# Patient Record
Sex: Female | Born: 1935
Health system: Southern US, Community
[De-identification: ages and names within clinical notes are randomized; demographics above are authoritative.]

## PROBLEM LIST (undated history)

## (undated) DIAGNOSIS — F419 Anxiety disorder, unspecified: Secondary | ICD-10-CM

## (undated) DIAGNOSIS — E785 Hyperlipidemia, unspecified: Secondary | ICD-10-CM

## (undated) DIAGNOSIS — I10 Essential (primary) hypertension: Secondary | ICD-10-CM

## (undated) DIAGNOSIS — K219 Gastro-esophageal reflux disease without esophagitis: Secondary | ICD-10-CM

## (undated) DIAGNOSIS — Z9889 Other specified postprocedural states: Secondary | ICD-10-CM

## (undated) HISTORY — DX: Gastro-esophageal reflux disease without esophagitis: K21.9

## (undated) HISTORY — PX: OTHER SURGICAL HISTORY: SHX169

## (undated) HISTORY — PX: CERVICAL DISC SURGERY: SHX588

## (undated) HISTORY — DX: Essential (primary) hypertension: I10

## (undated) HISTORY — DX: Anxiety disorder, unspecified: F41.9

## (undated) HISTORY — DX: Other specified postprocedural states: Z98.890

## (undated) HISTORY — DX: Hyperlipidemia, unspecified: E78.5

---

## 2007-10-15 ENCOUNTER — Ambulatory Visit: Payer: Self-pay | Admitting: Cardiology

## 2009-08-21 ENCOUNTER — Ambulatory Visit: Payer: Self-pay | Admitting: Cardiology

## 2011-08-07 DIAGNOSIS — R079 Chest pain, unspecified: Secondary | ICD-10-CM

## 2011-08-08 DIAGNOSIS — R079 Chest pain, unspecified: Secondary | ICD-10-CM

## 2011-08-09 ENCOUNTER — Inpatient Hospital Stay (HOSPITAL_COMMUNITY)
Admission: AD | Admit: 2011-08-09 | Discharge: 2011-08-10 | DRG: 287 | Disposition: A | Discharge status: Home / Self Care | Payer: Medicare HMO | Source: Other Acute Inpatient Hospital | Attending: Internal Medicine | Admitting: Internal Medicine

## 2011-08-09 DIAGNOSIS — R0789 Other chest pain: Secondary | ICD-10-CM | POA: Diagnosis present

## 2011-08-09 DIAGNOSIS — I2510 Atherosclerotic heart disease of native coronary artery without angina pectoris: Secondary | ICD-10-CM

## 2011-08-09 DIAGNOSIS — I10 Essential (primary) hypertension: Secondary | ICD-10-CM | POA: Diagnosis present

## 2011-08-09 DIAGNOSIS — F411 Generalized anxiety disorder: Secondary | ICD-10-CM | POA: Diagnosis present

## 2011-08-09 DIAGNOSIS — R079 Chest pain, unspecified: Secondary | ICD-10-CM

## 2011-08-09 DIAGNOSIS — E785 Hyperlipidemia, unspecified: Secondary | ICD-10-CM | POA: Diagnosis present

## 2011-08-09 DIAGNOSIS — K219 Gastro-esophageal reflux disease without esophagitis: Secondary | ICD-10-CM | POA: Diagnosis present

## 2011-08-10 LAB — CBC
HCT: 34.2 % — ABNORMAL LOW (ref 36.0–46.0)
MCH: 31.4 pg (ref 26.0–34.0)
MCV: 88.1 fL (ref 78.0–100.0)
Platelets: 173 10*3/uL (ref 150–400)
RBC: 3.88 MIL/uL (ref 3.87–5.11)
RDW: 13.1 % (ref 11.5–15.5)
WBC: 7 10*3/uL (ref 4.0–10.5)

## 2011-08-10 LAB — BASIC METABOLIC PANEL
BUN: 13 mg/dL (ref 6–23)
CO2: 26 mEq/L (ref 19–32)
Calcium: 9.1 mg/dL (ref 8.4–10.5)
Chloride: 107 mEq/L (ref 96–112)
Creatinine, Ser: 0.6 mg/dL (ref 0.50–1.10)

## 2011-08-23 NOTE — Discharge Summary (Signed)
NAME:  Heather, [name] NO.:  [id number]  MEDICAL RECORD NO.:  [id number]  LOCATION:  4704                         FACILITY:  [facility]  PHYSICIAN:  Lorine Bears, MD     DATE OF BIRTH:  May 29, 1931  DATE OF ADMISSION:  08/09/2011 DATE OF DISCHARGE:  08/10/2011                              DISCHARGE SUMMARY   PRIMARY CARDIOLOGIST:  Lorine Bears, MD  PRIMARY CARE PROVIDER:  Dr. Clelia Croft.  DISCHARGE DIAGNOSES: 1. Nonobstructive coronary artery disease. 2. Chest pain.  SECONDARY DIAGNOSES: 1. Hypertension. 2. Hyperlipidemia. 3. Gastroesophageal reflux disease. 4. Anxiety.  ALLERGIES:  No known drug allergies.  PROCEDURES/DIAGNOSTICS PERFORMED DURING HOSPITALIZATION:  Left heart catheterization with coronary arteriography. A.  Left main free of critical disease.  Left anterior descending artery is heavily calcified with multiple 30% areas of narrowing throughout the mid and distal vessel and diffuse luminal irregularities throughout with about 50% in the distal region, two diagonal branches with mild luminal irregularities, but no critical disease.  Circumflex by three posterior lateral branches with mild irregularities with no critical stenosis. Right coronary artery with 30-40% narrowing proximally and about 30-40% stenoses distally.  PDA and PLA are widely patent.  REASON FOR HOSPITALIZATION:  This is a 77 year old female who presented to the [facility] on August 06, 2011, with complaints of chest pain that started after moving boxes at home.  The pain was felt to be somewhat atypical, it was felt more musculoskeletal in nature; therefore, she underwent a nuclear stress test demonstrating inferolateral ischemia with an ejection fraction of 73%.  With the abnormal stress test, it was recommended that the patient proceed with cardiac catheterization.  She was agreeable to this and was transferred to [facility] for cardiac  catheterization.  HOSPITAL COURSE:  On August 09, 2011, the patient was brought to the Cardiac Cath Lab at [facility], informed consent was obtained by Dr. Riley Kill. As above, cardiac catheterization did demonstrate nonobstructive coronary artery disease. Please see above and Dr. Rosalyn Charters report for full details.  The patient did tolerate the procedure well and was brought for overnight observation.  The patient had no further complaints of chest pain.  She was ambulating without difficulty.  Her right groin was without evidence of hematoma. The patient will be discharged to home in good condition.  Statin has been added to her medical regimen.  DISCHARGE LABORATORY DATA:  Sodium 140, potassium 3.9, BUN 13, creatinine 0.6.  WBC is 7, hemoglobin 12.2, hematocrit 34.2, platelet 173.  DISCHARGE MEDICATIONS: 1. Crestor 40 mg one tablet daily. 2. Aspirin 81 mg one tablet daily. 3. Metoprolol tartrate 50 mg one tablet in the morning and half tablet     at night. 4. Omeprazole 20 mg daily. 5. Tramadol/acetaminophen 37.5/325 mg one tablet twice daily. 6. Clonazepam 1 mg one tablet nightly. 7. Gabapentin 100 mg one capsule twice daily. 8. MiraLax 17 g daily. 9. Multivitamin daily. 10.Vitamin D OTC one tablet daily. 11.Aleve 220 mg two tablets twice daily as needed. 12.Nitroglycerin sublingual 0.4 mg one tablet under the tongue at the     onset of chest pain, and may repeat every 5 minutes up to 3 doses.  FOLLOWUP PLANS AND INSTRUCTIONS: 1. The  patient will follow up with Dr. Kirke Corin in the [facility] in 3 weeks.  The office will call to schedule this     appointment. 2. The patient should follow up with her primary care provider as     previously scheduled. 3. The patient should her increase activity as tolerated.  She may     shower, but no bathing.  No lifting for 1 week greater than 5     pounds.  No driving for 2 days.  No sexual activity for 1 week. 4. She is to return  to work on August 20, 2011. 5. She is to continue low-sodium, heart-healthy diet. 6. She is to keep her cath site clean and dry, and call the office for     any problems. 7. She is to stop any activity that causes chest pain or shortness of     breath.  She may call the office for any problems or concerns.  DURATION OF DISCHARGE:  Thirty five minutes with physician and physician assistant time.     Leonette Monarch, PA-C   ______________________________ Lorine Bears, MD   NB/MEDQ  D:  08/10/2011  T:  08/11/2011  Job:  [id number]  cc:   Doylene Canning. Ladona Ridgel, MD Dr. Clelia Croft  Electronically Signed by Alen Blew P.A. on 08/11/2011 12:11:34 PM Electronically Signed by Lorine Bears MD on 08/23/2011 03:54:41 PM

## 2011-08-27 NOTE — Cardiovascular Report (Signed)
NAME:  Heather [name], Heather [name] NO.:  [id number]  MEDICAL RECORD NO.:  [id number]  LOCATION:  4704                         FACILITY:  [facility]  PHYSICIAN:  Arturo Morton. Riley Kill, MD, FACCDATE OF BIRTH:  06-Jul-1931  DATE OF PROCEDURE:  08/09/2011 DATE OF DISCHARGE:                           CARDIAC CATHETERIZATION   INDICATIONS:  This very nice lady who is 77 year old, she presented with chest pain.  The chest pain is somewhat atypical and that it sounds musculoskeletal in character.  However, she underwent radionuclide imaging studies.  The radionuclide imaging studies were suggestive of inferior ischemia.  She was seen by with [facility], and with positive inferior ischemia and ejection fraction 73%, it was felt that diagnostic cardiac catheterization was indicated.  She was transferred to [facility] for further evaluation.  PROCEDURES: 1. Left heart catheterization. 2. Coronary arteriography.  DESCRIPTION OF PROCEDURE:  The procedure was performed from the right femoral artery using 4-French catheters.  She tolerated this well. There were no complications.  She was taken to the holding area in satisfactory clinical condition.  HEMODYNAMIC DATA: 1. Central aortic pressure 154/67, mean 103. 2. LV pressure 166/19. 3. There was no gradient or pullback across aortic valve.  ANGIOGRAPHIC DATA: 1. Ventriculography was not performed.  We have multiple sources of     ventricular function data. 2. The left main is large in caliber and free of critical disease. 3. The left anterior descending artery is fairly heavily calcified.     There are multiple 30% areas of narrowing throughout the mid and     distal vessel and has diffuse luminal irregularities all throughout     the mid vessel up to about 50% in the distal LAD, however, it does     appear to be smooth and somewhat diffusely plaqued.  The apical LAD     goes to the apex.  Prior to this, there are 2 diagonal  branches.     The diagonal branches have some mild luminal irregularity, but did     not demonstrate critical disease. 4. The circumflex is a large-caliber vessel.  There is an AV     circumflex that supplies 3 posterolateral branches with mild     luminal irregularity, but no critical stenoses.  There is a very     large bifurcating marginal.  The marginal distally goes out over     the apical tip.  It is large in caliber and without critical     narrowing. 5. The right coronary artery is also somewhat calcified.  The right     coronary artery has about 30-40% narrowing proximally and about 30-     40% narrowing distally prior to the PDA and PLA.  The PDA and PLA     are widely patent, however.  CONCLUSIONS: 1. Nonobstructive coronary artery disease. 2. Chest pain.  DISPOSITION:  The patient will be discharged in the morning with followup with her primary care physician and Cardiology.     Arturo Morton. Riley Kill, MD, [facility]     TDS/MEDQ  D:  08/09/2011  T:  08/10/2011  Job:  [id number]  cc:   Lorine Bears, MD Doreen Beam, MD Kirstie Peri, MD  Electronically Signed by Shawnie Pons MD [facility] on 08/27/2011 09:13:43 AM

## 2012-04-04 ENCOUNTER — Ambulatory Visit: Payer: Medicare PPO | Admitting: Cardiology

## 2012-04-06 ENCOUNTER — Encounter: Payer: Self-pay | Admitting: *Deleted

## 2012-04-11 ENCOUNTER — Encounter: Payer: Self-pay | Admitting: Cardiology

## 2012-04-11 ENCOUNTER — Ambulatory Visit (INDEPENDENT_AMBULATORY_CARE_PROVIDER_SITE_OTHER): Payer: Medicare Other | Admitting: Cardiology

## 2012-04-11 VITALS — BP 147/70 | HR 61 | Ht 67.0 in | Wt 181.4 lb

## 2012-04-11 DIAGNOSIS — Z0389 Encounter for observation for other suspected diseases and conditions ruled out: Secondary | ICD-10-CM

## 2012-04-11 DIAGNOSIS — R002 Palpitations: Secondary | ICD-10-CM

## 2012-04-11 DIAGNOSIS — G4700 Insomnia, unspecified: Secondary | ICD-10-CM

## 2012-04-11 DIAGNOSIS — R079 Chest pain, unspecified: Secondary | ICD-10-CM

## 2012-04-11 MED ORDER — TRAZODONE HCL 100 MG PO TABS: ORAL_TABLET | ORAL | Status: DC

## 2012-04-11 NOTE — Patient Instructions (Addendum)
Continue all current medications. Trazodone 100mg  - may take 1/2 tab nightly for sleep.  May increase to 1 tab if necessary.    If medication above is helpful, follow up with primary MD regarding long term use and refills Your physician wants you to follow up in: 6 months.  You will receive a reminder letter in the mail one-two months in advance.  If you don't receive a letter, please call our office to schedule the follow up appointment

## 2012-04-22 DIAGNOSIS — R002 Palpitations: Secondary | ICD-10-CM | POA: Insufficient documentation

## 2012-04-22 DIAGNOSIS — R079 Chest pain, unspecified: Secondary | ICD-10-CM | POA: Insufficient documentation

## 2012-04-22 DIAGNOSIS — Z0389 Encounter for observation for other suspected diseases and conditions ruled out: Secondary | ICD-10-CM | POA: Insufficient documentation

## 2012-04-22 DIAGNOSIS — G4700 Insomnia, unspecified: Secondary | ICD-10-CM | POA: Insufficient documentation

## 2012-04-22 NOTE — Progress Notes (Signed)
Peyton Bottoms, MD, [facility] ABIM Board Certified in Adult Cardiovascular Medicine,Internal Medicine and Critical Care Medicine    CC:      followup patient with a history of substernal chest pain.                                                                            HPI:     Patient had a prior cardiac catheterization in 2012.  It showed no coronary artery disease.  The patient reports no anginal chest pain.  Sometimes it feels her chest distended.  She still is occasional palpitations particularly when she works at [employer] and is under stress.  At times also she feels very depressed.  She has difficulty sleeping.  Cardiac standpoint however she denies any orthopnea PND presyncope or syncope.    PMH: reviewed and listed in Problem List in Electronic Records (and see below) Past Medical History  Diagnosis Date  . Unspecified essential hypertension   . Other and unspecified hyperlipidemia   . GERD (gastroesophageal reflux disease)   . Anxiety    Past Surgical History  Procedure Date  . Cesarean section 1970    Allergies/SH/FHX : available in Electronic Records for review  No Known Allergies History   Social History  . Marital Status: Married    Spouse Name: N/A    Number of Children: N/A  . Years of Education: N/A   Occupational History  . Not on file.   Social History Main Topics  . Smoking status: Never Smoker   . Smokeless tobacco: Not on file  . Alcohol Use: Not on file  . Drug Use: Not on file  . Sexually Active: Not on file   Other Topics Concern  . Not on file   Social History Narrative  . No narrative on file   Family History  Problem Relation Age of Onset  . Hypertension    . Heart attack Sister     Medications: Current Outpatient Prescriptions  Medication Sig Dispense Refill  . ALPRAZolam (XANAX) 1 MG tablet Take 1 mg by mouth at bedtime.       Marland Kitchen aspirin 81 MG tablet Take 81 mg by mouth daily.      . chlorthalidone (HYGROTON) 25 MG tablet Take 25  mg by mouth daily.      . clonazePAM (KLONOPIN) 1 MG tablet Take 1 mg by mouth at bedtime as needed.      Marland Kitchen esomeprazole (NEXIUM) 40 MG capsule Take 40 mg by mouth daily before breakfast.      . fish oil-omega-3 fatty acids 1000 MG capsule Take 1 g by mouth daily.      Marland Kitchen gabapentin (NEURONTIN) 300 MG capsule Take 300 mg by mouth daily.      . metoprolol (LOPRESSOR) 50 MG tablet Take 50 mg by mouth 2 (two) times daily.      . Multiple Vitamin (MULTIVITAMIN) tablet Take 1 tablet by mouth daily.      . naproxen sodium (ANAPROX) 220 MG tablet Take 220 mg by mouth 2 (two) times daily with a meal.      . omeprazole (PRILOSEC) 20 MG capsule Take 20 mg by mouth daily.      Marland Kitchen  polyethylene glycol (MIRALAX / GLYCOLAX) packet Take 17 g by mouth daily.      . simvastatin (ZOCOR) 40 MG tablet Take 40 mg by mouth at bedtime.       . thiamine (VITAMIN B-1) 100 MG tablet Take 100 mg by mouth daily.      . traMADol-acetaminophen (ULTRACET) 37.5-325 MG per tablet Take 1 tablet by mouth 2 (two) times daily.      . traZODone (DESYREL) 100 MG tablet Take 1/2 tab (50mg ) at bedtime for sleep, may increase to 1 tab (100mg ) if necessary  30 tablet  1    ROS: No nausea or vomiting. No fever or chills.No melena or hematochezia.No bleeding.No claudication  Physical Exam: BP 147/70  Pulse 61  Ht 5\' 7"  (1.702 m)  Wt 181 lb 6.4 oz (82.283 kg)  BMI 28.41 kg/m2 General:well-nourished female in no distress Neck:normal carotid upstroke and no carotid bruit.  No thyromegaly normal JVD Lungs:clear breath sounds bilaterally no wheezing Cardiac:Regular rate and rhythm with normal S1-S2 no murmur rubs or gallops Vascular:no edema.  Normal distal pulses Skin:warm and dry Physcologic:normal affect  12lead ZOX:WRUEAV sinus rhythm with no acute ischemic changes. Limited bedside ECHO:N/A No images are attached to the encounter.   I reviewed and summarized the old records. I reviewed ECG and prior blood work.  Assessment  and Plan  Chest pain Patient reports no angina.  Coronary artery disease excluded No indication for ischemia testing see details of cardiac catheterization above  Insomnia Abdomen the patient prescription of trazodone.  Her symptoms are consistent with depression and I've asked her to discuss this with her primary care physician  Palpitations No evidence of significant arrhythmias.  Continue medical therapy.  EKG was reviewed and was within normal limits.    Patient Active Problem List  Diagnosis  . Palpitations  . Chest pain  . Coronary artery disease excluded  . Insomnia

## 2012-04-22 NOTE — Assessment & Plan Note (Signed)
Abdomen the patient prescription of trazodone.  Her symptoms are consistent with depression and I've asked her to discuss this with her primary care physician

## 2012-04-22 NOTE — Assessment & Plan Note (Signed)
No evidence of significant arrhythmias.  Continue medical therapy.  EKG was reviewed and was within normal limits.

## 2012-04-22 NOTE — Assessment & Plan Note (Signed)
Patient reports no angina.

## 2012-04-22 NOTE — Assessment & Plan Note (Signed)
No indication for ischemia testing see details of cardiac catheterization above

## 2012-08-22 HISTORY — PX: OTHER SURGICAL HISTORY: SHX169

## 2013-04-12 ENCOUNTER — Encounter (INDEPENDENT_AMBULATORY_CARE_PROVIDER_SITE_OTHER): Payer: Self-pay | Admitting: Ophthalmology

## 2013-04-19 ENCOUNTER — Encounter (INDEPENDENT_AMBULATORY_CARE_PROVIDER_SITE_OTHER): Payer: Medicare Other | Admitting: Ophthalmology

## 2013-04-19 DIAGNOSIS — H43819 Vitreous degeneration, unspecified eye: Secondary | ICD-10-CM

## 2013-04-19 DIAGNOSIS — H35039 Hypertensive retinopathy, unspecified eye: Secondary | ICD-10-CM

## 2013-04-19 DIAGNOSIS — H3530 Unspecified macular degeneration: Secondary | ICD-10-CM

## 2013-04-19 DIAGNOSIS — I10 Essential (primary) hypertension: Secondary | ICD-10-CM

## 2013-04-19 DIAGNOSIS — H4420 Degenerative myopia, unspecified eye: Secondary | ICD-10-CM

## 2014-03-18 ENCOUNTER — Encounter: Payer: Self-pay | Admitting: Cardiology

## 2014-03-19 ENCOUNTER — Encounter: Payer: Self-pay | Admitting: Cardiology

## 2014-04-02 ENCOUNTER — Encounter: Payer: Medicare Other | Admitting: Cardiology

## 2014-04-02 NOTE — Progress Notes (Signed)
    ERROR cancelled

## 2014-04-03 ENCOUNTER — Encounter: Payer: Self-pay | Admitting: Cardiology

## 2014-04-15 ENCOUNTER — Encounter: Payer: Self-pay | Admitting: *Deleted

## 2014-04-15 ENCOUNTER — Encounter: Payer: Self-pay | Admitting: Cardiology

## 2014-04-15 ENCOUNTER — Telehealth: Payer: Self-pay | Admitting: Cardiology

## 2014-04-15 ENCOUNTER — Ambulatory Visit (INDEPENDENT_AMBULATORY_CARE_PROVIDER_SITE_OTHER): Payer: Medicare Other | Admitting: Cardiology

## 2014-04-15 VITALS — BP 122/79 | HR 77 | Ht 67.0 in | Wt 195.0 lb

## 2014-04-15 DIAGNOSIS — R079 Chest pain, unspecified: Secondary | ICD-10-CM

## 2014-04-15 NOTE — Patient Instructions (Signed)
   Stop Lisinopril Continue all other medications.   Your physician has requested that you have a lexiscan myoview. For further information please visit [email or web address]. Please follow instruction sheet, as given. Office will contact with results via phone or letter.   Follow up in  3 months

## 2014-04-15 NOTE — Telephone Encounter (Signed)
comments: Leane Call for chest pain  Scheduled at [facility] 6/26

## 2014-04-15 NOTE — Progress Notes (Signed)
Clinical Summary Ms. Hinostroza is a 80 y.o.female seen today for the following medical problems.   1. Chest pain  - history of chest pain, prior cath in 2012 with non-obstructive disease.  - [facility] admit last month with atypical chest pain, no evidence of ACS. Echo 03/19/14 LVEF 60-65%, abnormal diastolic function.  - pain in mid chest, pressure pain. Nothing made better or worst. Could occur at rest or with exertion. No other associated symptoms. Pain last just a few minutes. Started approx 2-3 months. No change in frequency or severity. - increasing DOE x 2 months  2. Facial swelling  - recent admit to [facility] 02/2014 with facial swelling, thought possibly related to ACE-I. Medication has been discontinued  Past Medical History  Diagnosis Date  . Unspecified essential hypertension   . Other and unspecified hyperlipidemia   . GERD (gastroesophageal reflux disease)   . Anxiety      No Known Allergies   Current Outpatient Prescriptions  Medication Sig Dispense Refill  . ALPRAZolam (XANAX) 1 MG tablet Take 1 mg by mouth at bedtime.       Marland Kitchen aspirin 81 MG tablet Take 81 mg by mouth daily.      . chlorthalidone (HYGROTON) 25 MG tablet Take 25 mg by mouth daily.      . clonazePAM (KLONOPIN) 1 MG tablet Take 1 mg by mouth at bedtime as needed.      Marland Kitchen esomeprazole (NEXIUM) 40 MG capsule Take 40 mg by mouth daily before breakfast.      . fish oil-omega-3 fatty acids 1000 MG capsule Take 1 g by mouth daily.      Marland Kitchen gabapentin (NEURONTIN) 300 MG capsule Take 300 mg by mouth daily.      . metoprolol (LOPRESSOR) 50 MG tablet Take 50 mg by mouth 2 (two) times daily.      . Multiple Vitamin (MULTIVITAMIN) tablet Take 1 tablet by mouth daily.      . naproxen sodium (ANAPROX) 220 MG tablet Take 220 mg by mouth 2 (two) times daily with a meal.      . omeprazole (PRILOSEC) 20 MG capsule Take 20 mg by mouth daily.      . polyethylene glycol (MIRALAX / GLYCOLAX) packet Take 17 g by mouth  daily.      . simvastatin (ZOCOR) 40 MG tablet Take 40 mg by mouth at bedtime.       . thiamine (VITAMIN B-1) 100 MG tablet Take 100 mg by mouth daily.      . traMADol-acetaminophen (ULTRACET) 37.5-325 MG per tablet Take 1 tablet by mouth 2 (two) times daily.      . traZODone (DESYREL) 100 MG tablet Take 1/2 tab (50mg ) at bedtime for sleep, may increase to 1 tab (100mg ) if necessary  30 tablet  1   No current facility-administered medications for this visit.     Past Surgical History  Procedure Laterality Date  . Cesarean section  1970     No Known Allergies    Family History  Problem Relation Age of Onset  . Hypertension    . Heart attack Sister      Social History Ms. Kerkhoff reports that she has never smoked. She does not have any smokeless tobacco history on file. Ms. Mccarver has no alcohol history on file.   Review of Systems CONSTITUTIONAL: No weight loss, fever, chills, weakness or fatigue.  HEENT: Eyes: No visual loss, blurred vision, double vision or yellow sclerae.No hearing loss, sneezing,  congestion, runny nose or sore throat.  SKIN: No rash or itching.  CARDIOVASCULAR: per HPI RESPIRATORY: No shortness of breath, cough or sputum.  GASTROINTESTINAL: No anorexia, nausea, vomiting or diarrhea. No abdominal pain or blood.  GENITOURINARY: No burning on urination, no polyuria NEUROLOGICAL: No headache, dizziness, syncope, paralysis, ataxia, numbness or tingling in the extremities. No change in bowel or bladder control.  MUSCULOSKELETAL: No muscle, back pain, joint pain or stiffness.  LYMPHATICS: No enlarged nodes. No history of splenectomy.  PSYCHIATRIC: No history of depression or anxiety.  ENDOCRINOLOGIC: No reports of sweating, cold or heat intolerance. No polyuria or polydipsia.  Marland Kitchen   Physical Examination p 77 bp 122/79 Wt 195 lbs BMI 31 Gen: resting comfortably, no acute distress HEENT: no scleral icterus, pupils equal round and reactive, no  palptable cervical adenopathy,  CV: RRR, no m/r/g, no JVD Resp: Clear to auscultation bilaterally GI: abdomen is soft, non-tender, non-distended, normal bowel sounds, no hepatosplenomegaly MSK: extremities are warm, no edema.  Skin: warm, no rash Neuro:  no focal deficits Psych: appropriate affect   Diagnostic Studies 03/19/14 Echo [facility] LVEF 60-65%, abnormal diastolic dysfunction/grade not reported,   Cath 2012  HEMODYNAMIC DATA:  1. Central aortic pressure 154/67, mean 103.  2. LV pressure 166/19.  3. There was no gradient or pullback across aortic valve.  ANGIOGRAPHIC DATA:  1. Ventriculography was not performed. We have multiple sources of  ventricular function data.  2. The left main is large in caliber and free of critical disease.  3. The left anterior descending artery is fairly heavily calcified.  There are multiple 30% areas of narrowing throughout the mid and  distal vessel and has diffuse luminal irregularities all throughout  the mid vessel up to about 50% in the distal LAD, however, it does  appear to be smooth and somewhat diffusely plaqued. The apical LAD  goes to the apex. Prior to this, there are 2 diagonal branches.  The diagonal branches have some mild luminal irregularity, but did  not demonstrate critical disease.  4. The circumflex is a large-caliber vessel. There is an AV  circumflex that supplies 3 posterolateral branches with mild  luminal irregularity, but no critical stenoses. There is a very  large bifurcating marginal. The marginal distally goes out over  the apical tip. It is large in caliber and without critical  narrowing.  5. The right coronary artery is also somewhat calcified. The right  coronary artery has about 30-40% narrowing proximally and about 30-  40% narrowing distally prior to the PDA and PLA. The PDA and PLA  are widely patent, however.  CONCLUSIONS:  1. Nonobstructive coronary artery disease.  2. Chest pain.  DISPOSITION:  The patient will be discharged in the morning with  followup with her primary care physician and Cardiology.    Assessment and Plan  1. Chest pain - unclear etilogy, concerning for possible cardiac ischemia - cannot run due to chronic back pain, will obtain Lexiscan MPI   F/u 3 months  Arnoldo Lenis, M.D.

## 2014-04-17 NOTE — Telephone Encounter (Signed)
AUTH # [id number] EXP 05/15/14 Farrel Conners

## 2014-04-18 ENCOUNTER — Encounter (HOSPITAL_COMMUNITY)
Admission: RE | Admit: 2014-04-18 | Discharge: 2014-04-18 | Disposition: A | Discharge status: Home / Self Care | Payer: Medicare Other | Source: Ambulatory Visit | Attending: Cardiology | Admitting: Cardiology

## 2014-04-18 ENCOUNTER — Encounter (HOSPITAL_COMMUNITY): Payer: Self-pay

## 2014-04-18 ENCOUNTER — Ambulatory Visit (HOSPITAL_COMMUNITY)
Admission: RE | Admit: 2014-04-18 | Discharge: 2014-04-18 | Disposition: A | Discharge status: Home / Self Care | Payer: Medicare Other | Source: Ambulatory Visit | Attending: Cardiology | Admitting: Cardiology

## 2014-04-18 DIAGNOSIS — R079 Chest pain, unspecified: Secondary | ICD-10-CM

## 2014-04-18 MED ORDER — REGADENOSON 0.4 MG/5ML IV SOLN
INTRAVENOUS | Status: AC
  Administered 2014-04-18: 0.4 mg via INTRAVENOUS
  Filled 2014-04-18: qty 5

## 2014-04-18 MED ORDER — TECHNETIUM TC 99M SESTAMIBI - CARDIOLITE
10.0000 | Freq: Once | INTRAVENOUS | Status: AC | PRN
  Administered 2014-04-18: 08:00:00 10 via INTRAVENOUS

## 2014-04-18 MED ORDER — TECHNETIUM TC 99M SESTAMIBI GENERIC - CARDIOLITE
30.0000 | Freq: Once | INTRAVENOUS | Status: AC | PRN
  Administered 2014-04-18: 30 via INTRAVENOUS

## 2014-04-18 MED ORDER — SODIUM CHLORIDE 0.9 % IJ SOLN
INTRAMUSCULAR | Status: AC
  Administered 2014-04-18: 10 mL via INTRAVENOUS
  Filled 2014-04-18: qty 10

## 2014-04-18 MED ORDER — REGADENOSON 0.4 MG/5ML IV SOLN
0.4000 mg | Freq: Once | INTRAVENOUS | Status: AC | PRN
  Administered 2014-04-18: 0.4 mg via INTRAVENOUS

## 2014-04-18 MED ORDER — SODIUM CHLORIDE 0.9 % IJ SOLN
10.0000 mL | INTRAMUSCULAR | Status: DC | PRN
  Administered 2014-04-18: 10 mL via INTRAVENOUS

## 2014-04-18 NOTE — Progress Notes (Signed)
Stress Lab Nurses Notes - Camri Molloy 04/18/2014 Reason for doing test: Chest Pain Type of test: Wille Glaser Nurse performing test: Gerrit Halls, RN Nuclear Medicine Tech: Melburn Hake Echo Tech: Not Applicable MD performing test: Koneswaran/K.Purcell Nails NP Family MD: Woody Seller Test explained and consent signed: Yes.   IV started: 22g jelco, Saline lock flushed, No redness or edema and Saline lock started in radiology Symptoms: SOB & Flushed Treatment/Intervention: None Reason test stopped: protocol completed After recovery IV was: Discontinued via X-ray tech and No redness or edema Patient to return to Nuc. Med at : 10:15 Patient discharged: Home Patient's Condition upon discharge was: stable Comments: During test BP 139/68 & HR 81.  Recovery BP 141/68 & HR 76.  Symptoms resolved in recovery. Geanie Cooley T

## 2014-04-23 ENCOUNTER — Telehealth: Payer: Self-pay | Admitting: *Deleted

## 2014-04-23 NOTE — Telephone Encounter (Signed)
Notes Recorded by Laurine Blazer, LPN on 05/26/6628 at 4:76 PM Patient notified.

## 2014-04-23 NOTE — Telephone Encounter (Signed)
Message copied by Laurine Blazer on Wed Apr 23, 2014  5:00 PM ------      Message from: Arnoldo Lenis      Created: Tue Apr 22, 2014  1:43 PM       Negative stress test                  Zandra Abts MD ------

## 2014-07-10 ENCOUNTER — Encounter: Payer: Self-pay | Admitting: Cardiology

## 2014-07-10 ENCOUNTER — Ambulatory Visit (INDEPENDENT_AMBULATORY_CARE_PROVIDER_SITE_OTHER): Payer: Medicare Other | Admitting: Cardiology

## 2014-07-10 VITALS — BP 136/71 | HR 73 | Ht 67.0 in | Wt 200.0 lb

## 2014-07-10 DIAGNOSIS — R079 Chest pain, unspecified: Secondary | ICD-10-CM

## 2014-07-10 DIAGNOSIS — R0989 Other specified symptoms and signs involving the circulatory and respiratory systems: Secondary | ICD-10-CM

## 2014-07-10 DIAGNOSIS — R0609 Other forms of dyspnea: Secondary | ICD-10-CM

## 2014-07-10 DIAGNOSIS — R0600 Dyspnea, unspecified: Secondary | ICD-10-CM

## 2014-07-10 NOTE — Patient Instructions (Signed)
Continue all current medications. Your physician wants you to follow up in: 6 months.  You will receive a reminder letter in the mail one-two months in advance.  If you don't receive a letter, please call our office to schedule the follow up appointment   

## 2014-07-10 NOTE — Progress Notes (Signed)
Clinical Summary [name] is a 80 y.o.female seen today for follow up of the following medical problems.   1. Chest pain  - history of chest pain, prior cath in 2012 with non-obstructive disease.  - described worsening chest pain last visit - Lexiscan MPI 03/2014 without ischemia, normal LVEF - pain improved since last visit, started on prilosec with improvement.    2. DOE - ongoing for 2-3 months - no cough. No LE edema. No orthopnea.  - no hx of tobacco, though hx of second hand exposure.  - reports recent PFTs with pcp with no abnormalities. Reports increased weight gain x 6 months - has prn chlorthalidone but not using regularly.     Past Medical History  Diagnosis Date  . Unspecified essential hypertension   . Other and unspecified hyperlipidemia   . GERD (gastroesophageal reflux disease)   . Anxiety      No Known Allergies   Current Outpatient Prescriptions  Medication Sig Dispense Refill  . ALPRAZolam (XANAX) 1 MG tablet Take 1 mg by mouth at bedtime.       Marland Kitchen aspirin 81 MG tablet Take 81 mg by mouth daily.      . chlorthalidone (HYGROTON) 25 MG tablet Take 25 mg by mouth daily.      Marland Kitchen gabapentin (NEURONTIN) 300 MG capsule Take 300 mg by mouth daily.      . metoprolol (LOPRESSOR) 50 MG tablet Take 50 mg by mouth 2 (two) times daily.      Marland Kitchen omeprazole (PRILOSEC) 40 MG capsule Take 1 capsule by mouth daily.      . polyethylene glycol (MIRALAX / GLYCOLAX) packet Take 17 g by mouth daily.      . ranitidine (ZANTAC) 150 MG tablet Take 1 tablet by mouth daily.      . simvastatin (ZOCOR) 40 MG tablet Take 40 mg by mouth at bedtime.        No current facility-administered medications for this visit.     Past Surgical History  Procedure Laterality Date  . Cesarean section  1970     No Known Allergies    Family History  Problem Relation Age of Onset  . Hypertension    . Heart attack Sister      Social History Heather [name] reports that she  has never smoked. She has never used smokeless tobacco. Heather [name] has no alcohol history on file.   Review of Systems CONSTITUTIONAL: No weight loss, fever, chills, weakness or fatigue.  HEENT: Eyes: No visual loss, blurred vision, double vision or yellow sclerae.No hearing loss, sneezing, congestion, runny nose or sore throat.  SKIN: No rash or itching.  CARDIOVASCULAR: per HPI RESPIRATORY: per HPI.  GASTROINTESTINAL: No anorexia, nausea, vomiting or diarrhea. No abdominal pain or blood.  GENITOURINARY: No burning on urination, no polyuria NEUROLOGICAL: No headache, dizziness, syncope, paralysis, ataxia, numbness or tingling in the extremities. No change in bowel or bladder control.  MUSCULOSKELETAL: No muscle, back pain, joint pain or stiffness.  LYMPHATICS: No enlarged nodes. No history of splenectomy.  PSYCHIATRIC: No history of depression or anxiety.  ENDOCRINOLOGIC: No reports of sweating, cold or heat intolerance. No polyuria or polydipsia.  Marland Kitchen   Physical Examination p 73 bp 136/71 Wt 200 lbs BMI 31 Gen: resting comfortably, no acute distress HEENT: no scleral icterus, pupils equal round and reactive, no palptable cervical adenopathy,  CV: RRR, no m/r/g, no JVD, no carotid bruits Resp: Clear to auscultation bilaterally GI: abdomen is soft,  non-tender, non-distended, normal bowel sounds, no hepatosplenomegaly MSK: extremities are warm, no edema.  Skin: warm, no rash Neuro:  no focal deficits Psych: appropriate affect   Diagnostic Studies 03/19/14 [facility]  LVEF 60-65%, abnormal diastolic dysfunction/grade not reported,   Cath 2012  HEMODYNAMIC DATA:  1. Central aortic pressure 154/67, mean 103.  2. LV pressure 166/19.  3. There was no gradient or pullback across aortic valve.  ANGIOGRAPHIC DATA:  1. Ventriculography was not performed. We have multiple sources of  ventricular function data.  2. The left main is large in caliber and free of critical disease.    3. The left anterior descending artery is fairly heavily calcified.  There are multiple 30% areas of narrowing throughout the mid and  distal vessel and has diffuse luminal irregularities all throughout  the mid vessel up to about 50% in the distal LAD, however, it does  appear to be smooth and somewhat diffusely plaqued. The apical LAD  goes to the apex. Prior to this, there are 2 diagonal branches.  The diagonal branches have some mild luminal irregularity, but did  not demonstrate critical disease.  4. The circumflex is a large-caliber vessel. There is an AV  circumflex that supplies 3 posterolateral branches with mild  luminal irregularity, but no critical stenoses. There is a very  large bifurcating marginal. The marginal distally goes out over  the apical tip. It is large in caliber and without critical  narrowing.  5. The right coronary artery is also somewhat calcified. The right  coronary artery has about 30-40% narrowing proximally and about 30-  40% narrowing distally prior to the PDA and PLA. The PDA and PLA  are widely patent, however.  CONCLUSIONS:  1. Nonobstructive coronary artery disease.  2. Chest pain.  DISPOSITION: The patient will be discharged in the morning with  followup with her primary care physician and Cardiology.  03/2014 Lexiscan MPI IMPRESSION:  Lexiscan sestamibi: Electrically negative for ischemia Sestamibi  scan with evidence of normal perfusion, minimal soft tissue  attenuation. LVEF on gating calculated at greater than 80% with  normal wall motion.    Assessment and Plan  1. Chest pain - long history of chest pain with negative evaluations including negative cath in 2012, recent negative Lexiscan MPI - continue to follow clinically  2. DOE - unclear etiology, shedoes have diastolic dysfunction with some noted weight gain. Has prn chlorthalidone but has not been using - instructed to take chlorthalidone for next few days and follow symptoms.      F/u 6 months  Arnoldo Lenis, M.D.

## 2014-12-23 ENCOUNTER — Ambulatory Visit: Payer: Self-pay | Admitting: Cardiology

## 2014-12-23 NOTE — Progress Notes (Unsigned)
Clinical Summary Ms. Currin is a 81 y.o.female seen today for follow up of the following medical problems.   1. Chest pain  - history of chest pain, prior cath in 2012 with non-obstructive disease.  - described worsening chest pain last visit - Lexiscan MPI 03/2014 without ischemia, normal LVEF - pain improved since last visit, started on prilosec with improvement.    2. DOE - ongoing for 2-3 months - no cough. No LE edema. No orthopnea.  - no hx of tobacco, though hx of second hand exposure.  - reports recent PFTs with pcp with no abnormalities. Reports increased weight gain x 6 months - has prn chlorthalidone but not using regularly.    Past Medical History  Diagnosis Date  . Unspecified essential hypertension   . Other and unspecified hyperlipidemia   . GERD (gastroesophageal reflux disease)   . Anxiety      No Known Allergies   Current Outpatient Prescriptions  Medication Sig Dispense Refill  . ALPRAZolam (XANAX) 1 MG tablet Take 1 mg by mouth at bedtime.     Marland Kitchen aspirin 81 MG tablet Take 81 mg by mouth daily.    . chlorthalidone (HYGROTON) 25 MG tablet Take 25 mg by mouth daily as needed.     . gabapentin (NEURONTIN) 300 MG capsule Take 300 mg by mouth 3 (three) times daily.     . metoprolol (LOPRESSOR) 50 MG tablet Take 50 mg by mouth 2 (two) times daily.    Marland Kitchen omeprazole (PRILOSEC) 40 MG capsule Take 1 capsule by mouth daily.    . polyethylene glycol (MIRALAX / GLYCOLAX) packet Take 17 g by mouth daily.    . ranitidine (ZANTAC) 150 MG tablet Take 1 tablet by mouth daily.    . simvastatin (ZOCOR) 40 MG tablet Take 40 mg by mouth at bedtime.      No current facility-administered medications for this visit.     Past Surgical History  Procedure Laterality Date  . Cesarean section  1970     No Known Allergies    Family History  Problem Relation Age of Onset  . Hypertension    . Heart attack Sister      Social History Ms. Sherley  reports that she has never smoked. She has never used smokeless tobacco. Ms. Hollern has no alcohol history on file.   Review of Systems CONSTITUTIONAL: No weight loss, fever, chills, weakness or fatigue.  HEENT: Eyes: No visual loss, blurred vision, double vision or yellow sclerae.No hearing loss, sneezing, congestion, runny nose or sore throat.  SKIN: No rash or itching.  CARDIOVASCULAR:  RESPIRATORY: No shortness of breath, cough or sputum.  GASTROINTESTINAL: No anorexia, nausea, vomiting or diarrhea. No abdominal pain or blood.  GENITOURINARY: No burning on urination, no polyuria NEUROLOGICAL: No headache, dizziness, syncope, paralysis, ataxia, numbness or tingling in the extremities. No change in bowel or bladder control.  MUSCULOSKELETAL: No muscle, back pain, joint pain or stiffness.  LYMPHATICS: No enlarged nodes. No history of splenectomy.  PSYCHIATRIC: No history of depression or anxiety.  ENDOCRINOLOGIC: No reports of sweating, cold or heat intolerance. No polyuria or polydipsia.  Marland Kitchen   Physical Examination There were no vitals filed for this visit. There were no vitals filed for this visit.  Gen: resting comfortably, no acute distress HEENT: no scleral icterus, pupils equal round and reactive, no palptable cervical adenopathy,  CV Resp: Clear to auscultation bilaterally GI: abdomen is soft, non-tender, non-distended, normal bowel sounds, no hepatosplenomegaly MSK:  extremities are warm, no edema.  Skin: warm, no rash Neuro:  no focal deficits Psych: appropriate affect   Diagnostic Studies 03/19/14 [facility]  LVEF 60-65%, abnormal diastolic dysfunction/grade not reported,   Cath 2012  HEMODYNAMIC DATA:  1. Central aortic pressure 154/67, mean 103.  2. LV pressure 166/19.  3. There was no gradient or pullback across aortic valve.  ANGIOGRAPHIC DATA:  1. Ventriculography was not performed. We have multiple sources of  ventricular function data.   2. The left main is large in caliber and free of critical disease.  3. The left anterior descending artery is fairly heavily calcified.  There are multiple 30% areas of narrowing throughout the mid and  distal vessel and has diffuse luminal irregularities all throughout  the mid vessel up to about 50% in the distal LAD, however, it does  appear to be smooth and somewhat diffusely plaqued. The apical LAD  goes to the apex. Prior to this, there are 2 diagonal branches.  The diagonal branches have some mild luminal irregularity, but did  not demonstrate critical disease.  4. The circumflex is a large-caliber vessel. There is an AV  circumflex that supplies 3 posterolateral branches with mild  luminal irregularity, but no critical stenoses. There is a very  large bifurcating marginal. The marginal distally goes out over  the apical tip. It is large in caliber and without critical  narrowing.  5. The right coronary artery is also somewhat calcified. The right  coronary artery has about 30-40% narrowing proximally and about 30-  40% narrowing distally prior to the PDA and PLA. The PDA and PLA  are widely patent, however.  CONCLUSIONS:  1. Nonobstructive coronary artery disease.  2. Chest pain.  DISPOSITION: The patient will be discharged in the morning with  followup with her primary care physician and Cardiology.  03/2014 Lexiscan MPI IMPRESSION:  Lexiscan sestamibi: Electrically negative for ischemia Sestamibi  scan with evidence of normal perfusion, minimal soft tissue  attenuation. LVEF on gating calculated at greater than 80% with  normal wall motion.    Assessment and Plan  1. Chest pain - long history of chest pain with negative evaluations including negative cath in 2012, recent negative Lexiscan MPI - continue to follow clinically  2. DOE - unclear etiology, shedoes have diastolic dysfunction with some noted weight gain. Has prn chlorthalidone  but has not been using - instructed to take chlorthalidone for next few days and follow symptoms.       Arnoldo Lenis, M.D

## 2015-01-12 ENCOUNTER — Ambulatory Visit (INDEPENDENT_AMBULATORY_CARE_PROVIDER_SITE_OTHER): Payer: Medicare Other | Admitting: Cardiology

## 2015-01-12 ENCOUNTER — Encounter: Payer: Self-pay | Admitting: *Deleted

## 2015-01-12 ENCOUNTER — Encounter: Payer: Self-pay | Admitting: Cardiology

## 2015-01-12 VITALS — BP 155/76 | HR 65 | Ht 67.0 in | Wt 189.0 lb

## 2015-01-12 DIAGNOSIS — R0600 Dyspnea, unspecified: Secondary | ICD-10-CM

## 2015-01-12 DIAGNOSIS — R079 Chest pain, unspecified: Secondary | ICD-10-CM

## 2015-01-12 MED ORDER — CHLORTHALIDONE 25 MG PO TABS: 25.0000 mg | ORAL_TABLET | ORAL | Status: DC | PRN

## 2015-01-12 NOTE — Patient Instructions (Signed)
Continue all current medications. Follow up in  6 weeks

## 2015-01-12 NOTE — Progress Notes (Signed)
Clinical Summary Heather [name] is a 81 y.o.female seen today for follow up of the following medical problems.   1. Chest pain  - history of chest pain, prior cath in 2012 with non-obstructive disease.  - Lexiscan MPI 03/2014 without ischemia, normal LVEF - pain has improved since starting prilosec, no recent symptoms.    2. DOE - has prn chlorthalidone but not using regularly, on average two times a week. Still with some SOB  3. Social - husband recently passed, she is still undergoing the grieving process. He passed at home under home hospice due to CHF, kidney failure, and severe COPD.  Past Medical History  Diagnosis Date  . Unspecified essential hypertension   . Other and unspecified hyperlipidemia   . GERD (gastroesophageal reflux disease)   . Anxiety      No Known Allergies   Current Outpatient Prescriptions  Medication Sig Dispense Refill  . ALPRAZolam (XANAX) 1 MG tablet Take 1 mg by mouth at bedtime.     Marland Kitchen aspirin 81 MG tablet Take 81 mg by mouth daily.    . chlorthalidone (HYGROTON) 25 MG tablet Take 25 mg by mouth daily as needed.     . gabapentin (NEURONTIN) 300 MG capsule Take 300 mg by mouth 3 (three) times daily.     . metoprolol (LOPRESSOR) 50 MG tablet Take 50 mg by mouth 2 (two) times daily.    Marland Kitchen omeprazole (PRILOSEC) 40 MG capsule Take 1 capsule by mouth daily.    . polyethylene glycol (MIRALAX / GLYCOLAX) packet Take 17 g by mouth daily.    . ranitidine (ZANTAC) 150 MG tablet Take 1 tablet by mouth daily.    . simvastatin (ZOCOR) 40 MG tablet Take 40 mg by mouth at bedtime.      No current facility-administered medications for this visit.     Past Surgical History  Procedure Laterality Date  . Cesarean section  1970     No Known Allergies    Family History  Problem Relation Age of Onset  . Hypertension    . Heart attack Sister      Social History Ms. Badia reports that she has never smoked. She has never used smokeless  tobacco. Ms. Pownall has no alcohol history on file.   Review of Systems CONSTITUTIONAL: No weight loss, fever, chills, weakness or fatigue.  HEENT: Eyes: No visual loss, blurred vision, double vision or yellow sclerae.No hearing loss, sneezing, congestion, runny nose or sore throat.  SKIN: No rash or itching.  CARDIOVASCULAR: per HPI RESPIRATORY: No  cough or sputum.  GASTROINTESTINAL: No anorexia, nausea, vomiting or diarrhea. No abdominal pain or blood.  GENITOURINARY: No burning on urination, no polyuria NEUROLOGICAL: No headache, dizziness, syncope, paralysis, ataxia, numbness or tingling in the extremities. No change in bowel or bladder control.  MUSCULOSKELETAL: No muscle, back pain, joint pain or stiffness.  LYMPHATICS: No enlarged nodes. No history of splenectomy.  PSYCHIATRIC: No history of depression or anxiety.  ENDOCRINOLOGIC: No reports of sweating, cold or heat intolerance. No polyuria or polydipsia.  Marland Kitchen   Physical Examination p 81 bp 132/70 Wt 212 lbs BMI 31 Gen: resting comfortably, no acute distress HEENT: no scleral icterus, pupils equal round and reactive, no palptable cervical adenopathy,  CV: RRR, no m/r/g, o JVD Resp: Clear to auscultation bilaterally GI: abdomen is soft, non-tender, non-distended, normal bowel sounds, no hepatosplenomegaly MSK: extremities are warm, no edema.  Skin: warm, no rash Neuro:  no focal deficits Psych: appropriate  affect   Diagnostic Studies 03/19/14 [facility]  LVEF 60-65%, abnormal diastolic dysfunction/grade not reported,   Cath 2012  HEMODYNAMIC DATA:  1. Central aortic pressure 154/67, mean 103.  2. LV pressure 166/19.  3. There was no gradient or pullback across aortic valve.  ANGIOGRAPHIC DATA:  1. Ventriculography was not performed. We have multiple sources of  ventricular function data.  2. The left main is large in caliber and free of critical disease.  3. The left anterior descending artery is  fairly heavily calcified.  There are multiple 30% areas of narrowing throughout the mid and  distal vessel and has diffuse luminal irregularities all throughout  the mid vessel up to about 50% in the distal LAD, however, it does  appear to be smooth and somewhat diffusely plaqued. The apical LAD  goes to the apex. Prior to this, there are 2 diagonal branches.  The diagonal branches have some mild luminal irregularity, but did  not demonstrate critical disease.  4. The circumflex is a large-caliber vessel. There is an AV  circumflex that supplies 3 posterolateral branches with mild  luminal irregularity, but no critical stenoses. There is a very  large bifurcating marginal. The marginal distally goes out over  the apical tip. It is large in caliber and without critical  narrowing.  5. The right coronary artery is also somewhat calcified. The right  coronary artery has about 30-40% narrowing proximally and about 30-  40% narrowing distally prior to the PDA and PLA. The PDA and PLA  are widely patent, however.  CONCLUSIONS:  1. Nonobstructive coronary artery disease.  2. Chest pain.  DISPOSITION: The patient will be discharged in the morning with  followup with her primary care physician and Cardiology.  03/2014 Lexiscan MPI IMPRESSION:  Lexiscan sestamibi: Electrically negative for ischemia Sestamibi  scan with evidence of normal perfusion, minimal soft tissue  attenuation. LVEF on gating calculated at greater than 80% with  normal wall motion.     Assessment and Plan  1. Chest pain - long history of chest pain with negative evaluations including negative cath in 2012, recent negative Lexiscan MPI - continue to follow clinically. Likely GI as better with prilosec.  2. DOE -urged to use prn chlrothalidone more frequently Will f/u symptoms in next few weeks   F/u 6 weeks      Arnoldo Lenis, M.D.

## 2015-02-24 ENCOUNTER — Ambulatory Visit: Payer: Medicare Other | Admitting: Cardiology

## 2015-11-10 DIAGNOSIS — M9903 Segmental and somatic dysfunction of lumbar region: Secondary | ICD-10-CM | POA: Diagnosis not present

## 2015-11-10 DIAGNOSIS — M545 Low back pain: Secondary | ICD-10-CM | POA: Diagnosis not present

## 2015-11-10 DIAGNOSIS — M47816 Spondylosis without myelopathy or radiculopathy, lumbar region: Secondary | ICD-10-CM | POA: Diagnosis not present

## 2015-11-11 DIAGNOSIS — Z789 Other specified health status: Secondary | ICD-10-CM | POA: Diagnosis not present

## 2015-11-11 DIAGNOSIS — M25552 Pain in left hip: Secondary | ICD-10-CM | POA: Diagnosis not present

## 2015-11-11 DIAGNOSIS — I10 Essential (primary) hypertension: Secondary | ICD-10-CM | POA: Diagnosis not present

## 2015-11-11 DIAGNOSIS — Z418 Encounter for other procedures for purposes other than remedying health state: Secondary | ICD-10-CM | POA: Diagnosis not present

## 2015-11-15 DIAGNOSIS — Z79899 Other long term (current) drug therapy: Secondary | ICD-10-CM | POA: Diagnosis not present

## 2015-11-15 DIAGNOSIS — M1612 Unilateral primary osteoarthritis, left hip: Secondary | ICD-10-CM | POA: Diagnosis not present

## 2015-11-15 DIAGNOSIS — M5432 Sciatica, left side: Secondary | ICD-10-CM | POA: Diagnosis not present

## 2015-11-15 DIAGNOSIS — F419 Anxiety disorder, unspecified: Secondary | ICD-10-CM | POA: Diagnosis not present

## 2015-11-15 DIAGNOSIS — E7800 Pure hypercholesterolemia, unspecified: Secondary | ICD-10-CM | POA: Diagnosis not present

## 2015-11-15 DIAGNOSIS — I10 Essential (primary) hypertension: Secondary | ICD-10-CM | POA: Diagnosis not present

## 2015-11-15 DIAGNOSIS — M25552 Pain in left hip: Secondary | ICD-10-CM | POA: Diagnosis not present

## 2015-11-15 DIAGNOSIS — K219 Gastro-esophageal reflux disease without esophagitis: Secondary | ICD-10-CM | POA: Diagnosis not present

## 2015-11-15 DIAGNOSIS — M549 Dorsalgia, unspecified: Secondary | ICD-10-CM | POA: Diagnosis not present

## 2015-11-15 DIAGNOSIS — R030 Elevated blood-pressure reading, without diagnosis of hypertension: Secondary | ICD-10-CM | POA: Diagnosis not present

## 2015-11-15 DIAGNOSIS — M545 Low back pain: Secondary | ICD-10-CM | POA: Diagnosis not present

## 2015-11-25 DIAGNOSIS — F419 Anxiety disorder, unspecified: Secondary | ICD-10-CM | POA: Diagnosis not present

## 2015-11-25 DIAGNOSIS — M25552 Pain in left hip: Secondary | ICD-10-CM | POA: Diagnosis not present

## 2015-12-18 DIAGNOSIS — E7800 Pure hypercholesterolemia, unspecified: Secondary | ICD-10-CM | POA: Diagnosis not present

## 2015-12-18 DIAGNOSIS — R6889 Other general symptoms and signs: Secondary | ICD-10-CM | POA: Diagnosis not present

## 2015-12-18 DIAGNOSIS — F419 Anxiety disorder, unspecified: Secondary | ICD-10-CM | POA: Diagnosis not present

## 2015-12-18 DIAGNOSIS — I10 Essential (primary) hypertension: Secondary | ICD-10-CM | POA: Diagnosis not present

## 2015-12-18 DIAGNOSIS — Z418 Encounter for other procedures for purposes other than remedying health state: Secondary | ICD-10-CM | POA: Diagnosis not present

## 2015-12-21 DIAGNOSIS — J111 Influenza due to unidentified influenza virus with other respiratory manifestations: Secondary | ICD-10-CM | POA: Diagnosis not present

## 2015-12-21 DIAGNOSIS — R0602 Shortness of breath: Secondary | ICD-10-CM | POA: Diagnosis not present

## 2015-12-21 DIAGNOSIS — J441 Chronic obstructive pulmonary disease with (acute) exacerbation: Secondary | ICD-10-CM | POA: Diagnosis not present

## 2015-12-21 DIAGNOSIS — R112 Nausea with vomiting, unspecified: Secondary | ICD-10-CM | POA: Diagnosis not present

## 2015-12-21 DIAGNOSIS — I10 Essential (primary) hypertension: Secondary | ICD-10-CM | POA: Diagnosis not present

## 2015-12-21 DIAGNOSIS — Z888 Allergy status to other drugs, medicaments and biological substances status: Secondary | ICD-10-CM | POA: Diagnosis not present

## 2015-12-21 DIAGNOSIS — Z79899 Other long term (current) drug therapy: Secondary | ICD-10-CM | POA: Diagnosis not present

## 2015-12-21 DIAGNOSIS — R030 Elevated blood-pressure reading, without diagnosis of hypertension: Secondary | ICD-10-CM | POA: Diagnosis not present

## 2015-12-22 DIAGNOSIS — I10 Essential (primary) hypertension: Secondary | ICD-10-CM | POA: Diagnosis not present

## 2015-12-22 DIAGNOSIS — J111 Influenza due to unidentified influenza virus with other respiratory manifestations: Secondary | ICD-10-CM | POA: Diagnosis not present

## 2015-12-26 ENCOUNTER — Emergency Department (HOSPITAL_COMMUNITY): Payer: PPO

## 2015-12-26 ENCOUNTER — Encounter (HOSPITAL_COMMUNITY): Payer: Self-pay | Admitting: Cardiology

## 2015-12-26 ENCOUNTER — Emergency Department (HOSPITAL_COMMUNITY)
Admission: EM | Admit: 2015-12-26 | Discharge: 2015-12-26 | Disposition: A | Discharge status: Home / Self Care | Payer: PPO | Attending: Emergency Medicine | Admitting: Emergency Medicine

## 2015-12-26 DIAGNOSIS — R062 Wheezing: Secondary | ICD-10-CM | POA: Insufficient documentation

## 2015-12-26 DIAGNOSIS — I6789 Other cerebrovascular disease: Secondary | ICD-10-CM | POA: Diagnosis not present

## 2015-12-26 DIAGNOSIS — R42 Dizziness and giddiness: Secondary | ICD-10-CM | POA: Diagnosis not present

## 2015-12-26 DIAGNOSIS — Z79899 Other long term (current) drug therapy: Secondary | ICD-10-CM | POA: Diagnosis not present

## 2015-12-26 DIAGNOSIS — H538 Other visual disturbances: Secondary | ICD-10-CM | POA: Diagnosis not present

## 2015-12-26 DIAGNOSIS — M79601 Pain in right arm: Secondary | ICD-10-CM | POA: Diagnosis not present

## 2015-12-26 DIAGNOSIS — E785 Hyperlipidemia, unspecified: Secondary | ICD-10-CM | POA: Insufficient documentation

## 2015-12-26 DIAGNOSIS — R112 Nausea with vomiting, unspecified: Secondary | ICD-10-CM | POA: Diagnosis not present

## 2015-12-26 DIAGNOSIS — I10 Essential (primary) hypertension: Secondary | ICD-10-CM | POA: Diagnosis not present

## 2015-12-26 DIAGNOSIS — I169 Hypertensive crisis, unspecified: Secondary | ICD-10-CM

## 2015-12-26 LAB — URINALYSIS, ROUTINE W REFLEX MICROSCOPIC
BILIRUBIN URINE: NEGATIVE
GLUCOSE, UA: NEGATIVE mg/dL
HGB URINE DIPSTICK: NEGATIVE
Ketones, ur: NEGATIVE mg/dL
Leukocytes, UA: NEGATIVE
Nitrite: NEGATIVE
Protein, ur: 30 mg/dL — AB
SPECIFIC GRAVITY, URINE: 1.02 (ref 1.005–1.030)
pH: 5.5 (ref 5.0–8.0)

## 2015-12-26 LAB — CBC WITH DIFFERENTIAL/PLATELET
Basophils Absolute: 0 10*3/uL (ref 0.0–0.1)
Basophils Relative: 0 %
EOS ABS: 0.1 10*3/uL (ref 0.0–0.7)
EOS PCT: 1 %
HCT: 45.1 % (ref 36.0–46.0)
HEMOGLOBIN: 15.4 g/dL — AB (ref 12.0–15.0)
LYMPHS ABS: 2.8 10*3/uL (ref 0.7–4.0)
LYMPHS PCT: 33 %
MCH: 30.4 pg (ref 26.0–34.0)
MCHC: 34.1 g/dL (ref 30.0–36.0)
MCV: 89.1 fL (ref 78.0–100.0)
MONOS PCT: 7 %
Monocytes Absolute: 0.6 10*3/uL (ref 0.1–1.0)
Neutro Abs: 5 10*3/uL (ref 1.7–7.7)
Neutrophils Relative %: 59 %
Platelets: 226 10*3/uL (ref 150–400)
RBC: 5.06 MIL/uL (ref 3.87–5.11)
RDW: 13.8 % (ref 11.5–15.5)
WBC: 8.5 10*3/uL (ref 4.0–10.5)

## 2015-12-26 LAB — COMPREHENSIVE METABOLIC PANEL
ALBUMIN: 3.8 g/dL (ref 3.5–5.0)
ALK PHOS: 58 U/L (ref 38–126)
ALT: 67 U/L — AB (ref 14–54)
AST: 56 U/L — AB (ref 15–41)
Anion gap: 10 (ref 5–15)
BILIRUBIN TOTAL: 0.8 mg/dL (ref 0.3–1.2)
BUN: 19 mg/dL (ref 6–20)
CO2: 24 mmol/L (ref 22–32)
CREATININE: 0.6 mg/dL (ref 0.44–1.00)
Calcium: 9.3 mg/dL (ref 8.9–10.3)
Chloride: 105 mmol/L (ref 101–111)
GFR calc Af Amer: >60 mL/min (ref >60)
GLUCOSE: 126 mg/dL — AB (ref 65–99)
POTASSIUM: 4 mmol/L (ref 3.5–5.1)
Sodium: 139 mmol/L (ref 135–145)
TOTAL PROTEIN: 7.2 g/dL (ref 6.5–8.1)

## 2015-12-26 LAB — URINE MICROSCOPIC-ADD ON

## 2015-12-26 LAB — LIPASE, BLOOD: LIPASE: 27 U/L (ref 11–51)

## 2015-12-26 LAB — BRAIN NATRIURETIC PEPTIDE: B Natriuretic Peptide: 39 pg/mL (ref 0.0–100.0)

## 2015-12-26 LAB — TROPONIN I: Troponin I: <0.03 ng/mL (ref <0.031)

## 2015-12-26 MED ORDER — METOPROLOL TARTRATE 1 MG/ML IV SOLN
10.0000 mg | INTRAVENOUS | Status: DC | PRN
  Administered 2015-12-26: 10 mg via INTRAVENOUS
  Filled 2015-12-26: qty 10

## 2015-12-26 MED ORDER — METOPROLOL TARTRATE 75 MG PO TABS: 50.0000 mg | ORAL_TABLET | Freq: Two times a day (BID) | ORAL | Status: DC

## 2015-12-26 MED ORDER — PREDNISONE 20 MG PO TABS: 40.0000 mg | ORAL_TABLET | Freq: Every day | ORAL | Status: DC

## 2015-12-26 MED ORDER — ALBUTEROL SULFATE (2.5 MG/3ML) 0.083% IN NEBU
5.0000 mg | INHALATION_SOLUTION | Freq: Once | RESPIRATORY_TRACT | Status: AC
  Administered 2015-12-26: 5 mg via RESPIRATORY_TRACT
  Filled 2015-12-26: qty 6

## 2015-12-26 NOTE — Discharge Instructions (Signed)
As discussed, is very important that you follow up with hyour primary care physician within the next 4 or 5 days to discuss your ED evaluation, your new medication regimen, and to ensure that you have improved.  Return here for concerning changes in your condition.

## 2015-12-26 NOTE — ED Provider Notes (Signed)
CSN: [id number]     Arrival date & time 12/26/15  1636 History   First MD Initiated Contact with Patient 12/26/15 1639     Chief Complaint  Patient presents with  . Hypertension     (Consider location/radiation/quality/duration/timing/severity/associated sxs/prior Treatment) HPI Patient presents with concern of dizziness, hypertension. Patient states that about one hour ago, she had an episode of dizziness, bilateral blurred vision. She checked her blood pressure, found it to be elevated called EMS. Currently the patient denies pain, vision changes, dizziness. She does voice concern about her persistently elevated blood pressure. She denies confusion, disorientation, nausea. She takes all medication as directed, including antihypertensives. She notes no new medication, diet, lifestyle changes. Today, no clear precipitant for her episode of dizziness.  I discussed patient's case with EMS providers, both while she was in route, and on arrival. En route, the patient had blood pressure 123456 systolic. Here, the patient's initial blood pressure is 230/80   Past Medical History  Diagnosis Date  . Unspecified essential hypertension   . Other and unspecified hyperlipidemia   . GERD (gastroesophageal reflux disease)   . Anxiety    Past Surgical History  Procedure Laterality Date  . Cesarean section  1970   Family History  Problem Relation Age of Onset  . Hypertension    . Heart attack Sister    Social History  Substance Use Topics  . Smoking status: Never Smoker   . Smokeless tobacco: Never Used  . Alcohol Use: No   OB History    No data available     Review of Systems  Constitutional:       Per HPI, otherwise negative  HENT:       Per HPI, otherwise negative  Respiratory:       Per HPI, otherwise negative  Cardiovascular:       Per HPI, otherwise negative  Gastrointestinal: Negative for vomiting.  Endocrine:       Negative aside from HPI  Genitourinary:       Neg  aside from HPI   Musculoskeletal:       Per HPI, otherwise negative  Skin: Negative.   Neurological: Positive for dizziness. Negative for syncope.      Allergies  Review of patient's allergies indicates no known allergies.  Home Medications   Prior to Admission medications   Medication Sig Start Date End Date Taking? Authorizing Provider  ALPRAZolam Duanne Moron) 1 MG tablet Take 0.5-1 mg by mouth 2 (two) times daily. 0.5 tablet in the morning and 1 tablet at bedtime 03/16/12  Yes Historical Provider, MD  aspirin 81 MG tablet Take 81 mg by mouth daily.   Yes Historical Provider, MD  HYDROcodone-acetaminophen (NORCO/VICODIN) 5-325 MG tablet Take 0.5 tablets by mouth 4 (four) times daily as needed for moderate pain.   Yes Historical Provider, MD  metoprolol (LOPRESSOR) 50 MG tablet Take 50 mg by mouth 2 (two) times daily.   Yes Historical Provider, MD  omeprazole (PRILOSEC) 40 MG capsule Take 1 capsule by mouth daily. 03/17/14  Yes Historical Provider, MD  polyethylene glycol (MIRALAX / GLYCOLAX) packet Take 17 g by mouth daily.   Yes Historical Provider, MD  azithromycin (ZITHROMAX) 250 MG tablet Reported on 12/26/2015 12/18/15   Historical Provider, MD  oseltamivir (TAMIFLU) 75 MG capsule Reported on 12/26/2015 12/18/15   Historical Provider, MD   BP 153/78 mmHg  Pulse 96  Temp(Src) 98.2 F (36.8 C) (Oral)  Resp 21  Ht 5\' 7"  (1.702 m)  Wt  192 lb (87.091 kg)  BMI 30.06 kg/m2  SpO2 92% Physical Exam  Constitutional: She is oriented to person, place, and time. She appears well-developed and well-nourished. No distress.  HENT:  Head: Normocephalic and atraumatic.  Eyes: Conjunctivae and EOM are normal.  Cardiovascular: Normal rate, regular rhythm and intact distal pulses.   Symmetric bilat UE pulses  Pulmonary/Chest: Effort normal. No stridor. No respiratory distress. She has decreased breath sounds. She has wheezes.  Abdominal: She exhibits no distension.  Musculoskeletal: She exhibits no  edema.  Neurological: She is alert and oriented to person, place, and time. No cranial nerve deficit.  Skin: Skin is warm and dry.  Psychiatric: She has a normal mood and affect.  Nursing note and vitals reviewed.   ED Course  Procedures (including critical care time) Labs Review Labs Reviewed  COMPREHENSIVE METABOLIC PANEL - Abnormal; Notable for the following:    Glucose, Bld 126 (*)    AST 56 (*)    ALT 67 (*)    All other components within normal limits  CBC WITH DIFFERENTIAL/PLATELET - Abnormal; Notable for the following:    Hemoglobin 15.4 (*)    All other components within normal limits  URINALYSIS, ROUTINE W REFLEX MICROSCOPIC (NOT AT [facility]) - Abnormal; Notable for the following:    Protein, ur 30 (*)    All other components within normal limits  URINE MICROSCOPIC-ADD ON - Abnormal; Notable for the following:    Squamous Epithelial / LPF 0-5 (*)    Bacteria, UA RARE (*)    All other components within normal limits  LIPASE, BLOOD  BRAIN NATRIURETIC PEPTIDE  TROPONIN I    Update:, Patient receiving albuterol therapy  Imaging Review Dg Chest 2 View  12/26/2015  CLINICAL DATA:  Acute onset chest pain and dyspnea tonight. Blurred vision and hypertensive urgency. Shortness of breath for 1 month and productive cough. EXAM: CHEST  2 VIEW COMPARISON:  None. FINDINGS: The heart size and mediastinal contours are within normal limits. Both lungs are clear. No evidence of pneumothorax or pleural effusion. Previous vertebroplasty seen near thoracolumbar junction. IMPRESSION: No active cardiopulmonary disease. Electronically Signed   By: Earle Gell M.D.   On: 12/26/2015 19:44   I have personally reviewed and evaluated these images and lab results as part of my medical decision-making.   EKG Interpretation   Date/Time:  Saturday December 26 2015 16:56:32 EST Ventricular Rate:  75 PR Interval:  154 QRS Duration: 99 QT Interval:  383 QTC Calculation: 428 R Axis:   -41 Text  Interpretation:  Sinus rhythm Left ventricular hypertrophy Sinus  rhythm Left axis deviation Left ventricular hypertrophy Abnormal ekg  Confirmed by Carmin Muskrat  MD ([id number]) on 12/26/2015 6:37:20 PM     8:02 PM Blood pressure 160/80. Patient breathing substantially better.  I had a lengthy conversation with the patient and 3 minute family members about all results, need for close outpatient follow-up with primary care after change in her blood pressure medication, initiation medication for COPD control. Patient states that she feels better, is smiling. MDM  Patient presents with concern for dyspnea, episodic blurred vision, and is initially quite hypertensive. Patient's blood pressure well controlled with beta blocker bolus. Patient is not found to have end organ effects of hypertensive crisis. However, patient did have dyspnea, which improved with albuterol therapy, steroids, and given her history of COPD, she will have been for both COPD exacerbation and persistent hypertension on discharge. With her substantial improvement, she is appropriate for further  evaluation, management as an outpatient.  Carmin Muskrat, MD 12/26/15 2004

## 2015-12-26 NOTE — ED Notes (Signed)
Pt c/o chest pain, dyspnea on exertion today. Pt denies pain at this time. Pt states she has had L leg numbness x 3 weeks. Pt was seen at [facility] for same symptoms earlier this week and states no dx found.

## 2015-12-26 NOTE — ED Notes (Signed)
EMS called out for chest pain.  Upon arrival pt c/o blurred vision and pain in right arm,  Nausea.  EMS gave zofran IV.  EKG normal with EMS.  Manual blood pressure 260/130.  Recently admitted to [facility] hospital for hypertension.

## 2015-12-26 NOTE — ED Notes (Signed)
Pt ambulatory to bathroom. Sob with exertion.  Room air pulse ox 98% after ambulating back to bed.

## 2015-12-28 DIAGNOSIS — M4726 Other spondylosis with radiculopathy, lumbar region: Secondary | ICD-10-CM | POA: Diagnosis not present

## 2015-12-30 ENCOUNTER — Other Ambulatory Visit: Payer: Self-pay | Admitting: Neurosurgery

## 2015-12-30 DIAGNOSIS — M545 Low back pain: Principal | ICD-10-CM

## 2015-12-30 DIAGNOSIS — G8929 Other chronic pain: Secondary | ICD-10-CM

## 2016-01-01 DIAGNOSIS — Z299 Encounter for prophylactic measures, unspecified: Secondary | ICD-10-CM | POA: Diagnosis not present

## 2016-01-01 DIAGNOSIS — Z09 Encounter for follow-up examination after completed treatment for conditions other than malignant neoplasm: Secondary | ICD-10-CM | POA: Diagnosis not present

## 2016-01-01 DIAGNOSIS — Z6831 Body mass index (BMI) 31.0-31.9, adult: Secondary | ICD-10-CM | POA: Diagnosis not present

## 2016-01-01 DIAGNOSIS — I10 Essential (primary) hypertension: Secondary | ICD-10-CM | POA: Diagnosis not present

## 2016-01-04 DIAGNOSIS — I10 Essential (primary) hypertension: Secondary | ICD-10-CM | POA: Diagnosis not present

## 2016-01-04 DIAGNOSIS — M159 Polyosteoarthritis, unspecified: Secondary | ICD-10-CM | POA: Diagnosis not present

## 2016-01-06 ENCOUNTER — Other Ambulatory Visit: Payer: PPO

## 2016-01-06 ENCOUNTER — Ambulatory Visit
Admission: RE | Admit: 2016-01-06 | Discharge: 2016-01-06 | Disposition: A | Discharge status: Home / Self Care | Payer: PPO | Source: Ambulatory Visit | Attending: Neurosurgery | Admitting: Neurosurgery

## 2016-01-06 DIAGNOSIS — M5127 Other intervertebral disc displacement, lumbosacral region: Secondary | ICD-10-CM | POA: Diagnosis not present

## 2016-01-06 DIAGNOSIS — G8929 Other chronic pain: Secondary | ICD-10-CM

## 2016-01-06 DIAGNOSIS — M545 Low back pain: Principal | ICD-10-CM

## 2016-01-06 MED ORDER — IOHEXOL 180 MG/ML  SOLN
1.0000 mL | Freq: Once | INTRAMUSCULAR | Status: AC | PRN
  Administered 2016-01-06: 1 mL via EPIDURAL

## 2016-01-06 MED ORDER — METHYLPREDNISOLONE ACETATE 40 MG/ML INJ SUSP (RADIOLOG
120.0000 mg | Freq: Once | INTRAMUSCULAR | Status: AC
  Administered 2016-01-06: 120 mg via EPIDURAL

## 2016-01-06 NOTE — Discharge Instructions (Signed)

## 2016-01-08 DIAGNOSIS — M79675 Pain in left toe(s): Secondary | ICD-10-CM | POA: Diagnosis not present

## 2016-01-08 DIAGNOSIS — L600 Ingrowing nail: Secondary | ICD-10-CM | POA: Diagnosis not present

## 2016-01-08 DIAGNOSIS — B351 Tinea unguium: Secondary | ICD-10-CM | POA: Diagnosis not present

## 2016-01-22 DIAGNOSIS — L600 Ingrowing nail: Secondary | ICD-10-CM | POA: Diagnosis not present

## 2016-01-29 DIAGNOSIS — M4726 Other spondylosis with radiculopathy, lumbar region: Secondary | ICD-10-CM | POA: Diagnosis not present

## 2016-02-01 DIAGNOSIS — M545 Low back pain: Secondary | ICD-10-CM | POA: Diagnosis not present

## 2016-02-01 DIAGNOSIS — G4700 Insomnia, unspecified: Secondary | ICD-10-CM | POA: Diagnosis not present

## 2016-02-01 DIAGNOSIS — I10 Essential (primary) hypertension: Secondary | ICD-10-CM | POA: Diagnosis not present

## 2016-02-17 DIAGNOSIS — H353132 Nonexudative age-related macular degeneration, bilateral, intermediate dry stage: Secondary | ICD-10-CM | POA: Diagnosis not present

## 2016-02-17 DIAGNOSIS — H538 Other visual disturbances: Secondary | ICD-10-CM | POA: Diagnosis not present

## 2016-02-17 DIAGNOSIS — Z961 Presence of intraocular lens: Secondary | ICD-10-CM | POA: Diagnosis not present

## 2016-02-29 DIAGNOSIS — G4700 Insomnia, unspecified: Secondary | ICD-10-CM | POA: Diagnosis not present

## 2016-02-29 DIAGNOSIS — I10 Essential (primary) hypertension: Secondary | ICD-10-CM | POA: Diagnosis not present

## 2016-03-14 DIAGNOSIS — M159 Polyosteoarthritis, unspecified: Secondary | ICD-10-CM | POA: Diagnosis not present

## 2016-03-14 DIAGNOSIS — I10 Essential (primary) hypertension: Secondary | ICD-10-CM | POA: Diagnosis not present

## 2016-03-18 DIAGNOSIS — J069 Acute upper respiratory infection, unspecified: Secondary | ICD-10-CM | POA: Diagnosis not present

## 2016-03-18 DIAGNOSIS — Z789 Other specified health status: Secondary | ICD-10-CM | POA: Diagnosis not present

## 2016-04-04 DIAGNOSIS — M159 Polyosteoarthritis, unspecified: Secondary | ICD-10-CM | POA: Diagnosis not present

## 2016-04-04 DIAGNOSIS — I10 Essential (primary) hypertension: Secondary | ICD-10-CM | POA: Diagnosis not present

## 2016-05-02 DIAGNOSIS — R7989 Other specified abnormal findings of blood chemistry: Secondary | ICD-10-CM | POA: Diagnosis not present

## 2016-05-02 DIAGNOSIS — Z299 Encounter for prophylactic measures, unspecified: Secondary | ICD-10-CM | POA: Diagnosis not present

## 2016-05-02 DIAGNOSIS — I10 Essential (primary) hypertension: Secondary | ICD-10-CM | POA: Diagnosis not present

## 2016-05-30 DIAGNOSIS — R7989 Other specified abnormal findings of blood chemistry: Secondary | ICD-10-CM | POA: Diagnosis not present

## 2016-05-30 DIAGNOSIS — F419 Anxiety disorder, unspecified: Secondary | ICD-10-CM | POA: Diagnosis not present

## 2016-05-30 DIAGNOSIS — M4854XA Collapsed vertebra, not elsewhere classified, thoracic region, initial encounter for fracture: Secondary | ICD-10-CM | POA: Diagnosis not present

## 2016-05-30 DIAGNOSIS — I10 Essential (primary) hypertension: Secondary | ICD-10-CM | POA: Diagnosis not present

## 2016-05-30 DIAGNOSIS — G4700 Insomnia, unspecified: Secondary | ICD-10-CM | POA: Diagnosis not present

## 2016-06-16 DIAGNOSIS — I10 Essential (primary) hypertension: Secondary | ICD-10-CM | POA: Diagnosis not present

## 2016-06-16 DIAGNOSIS — M159 Polyosteoarthritis, unspecified: Secondary | ICD-10-CM | POA: Diagnosis not present

## 2016-06-22 DIAGNOSIS — H538 Other visual disturbances: Secondary | ICD-10-CM | POA: Diagnosis not present

## 2016-06-22 DIAGNOSIS — R262 Difficulty in walking, not elsewhere classified: Secondary | ICD-10-CM | POA: Diagnosis not present

## 2016-06-22 DIAGNOSIS — K219 Gastro-esophageal reflux disease without esophagitis: Secondary | ICD-10-CM | POA: Diagnosis not present

## 2016-06-22 DIAGNOSIS — Z8249 Family history of ischemic heart disease and other diseases of the circulatory system: Secondary | ICD-10-CM | POA: Diagnosis not present

## 2016-06-22 DIAGNOSIS — M549 Dorsalgia, unspecified: Secondary | ICD-10-CM | POA: Diagnosis not present

## 2016-06-22 DIAGNOSIS — E7800 Pure hypercholesterolemia, unspecified: Secondary | ICD-10-CM | POA: Diagnosis not present

## 2016-06-22 DIAGNOSIS — R42 Dizziness and giddiness: Secondary | ICD-10-CM | POA: Diagnosis not present

## 2016-06-22 DIAGNOSIS — R0602 Shortness of breath: Secondary | ICD-10-CM | POA: Diagnosis not present

## 2016-06-22 DIAGNOSIS — R109 Unspecified abdominal pain: Secondary | ICD-10-CM | POA: Diagnosis not present

## 2016-06-22 DIAGNOSIS — R110 Nausea: Secondary | ICD-10-CM | POA: Diagnosis not present

## 2016-06-22 DIAGNOSIS — F419 Anxiety disorder, unspecified: Secondary | ICD-10-CM | POA: Diagnosis not present

## 2016-06-22 DIAGNOSIS — I10 Essential (primary) hypertension: Secondary | ICD-10-CM | POA: Diagnosis not present

## 2016-06-22 DIAGNOSIS — G8929 Other chronic pain: Secondary | ICD-10-CM | POA: Diagnosis not present

## 2016-06-22 DIAGNOSIS — R0609 Other forms of dyspnea: Secondary | ICD-10-CM | POA: Diagnosis not present

## 2016-07-06 DIAGNOSIS — C44622 Squamous cell carcinoma of skin of right upper limb, including shoulder: Secondary | ICD-10-CM | POA: Diagnosis not present

## 2016-07-06 DIAGNOSIS — C44629 Squamous cell carcinoma of skin of left upper limb, including shoulder: Secondary | ICD-10-CM | POA: Diagnosis not present

## 2016-07-06 DIAGNOSIS — R1033 Periumbilical pain: Secondary | ICD-10-CM | POA: Diagnosis not present

## 2016-07-06 DIAGNOSIS — R0600 Dyspnea, unspecified: Secondary | ICD-10-CM | POA: Diagnosis not present

## 2016-07-06 DIAGNOSIS — D0461 Carcinoma in situ of skin of right upper limb, including shoulder: Secondary | ICD-10-CM | POA: Diagnosis not present

## 2016-07-06 DIAGNOSIS — D485 Neoplasm of uncertain behavior of skin: Secondary | ICD-10-CM | POA: Diagnosis not present

## 2016-07-07 DIAGNOSIS — R748 Abnormal levels of other serum enzymes: Secondary | ICD-10-CM | POA: Diagnosis not present

## 2016-07-14 DIAGNOSIS — R748 Abnormal levels of other serum enzymes: Secondary | ICD-10-CM | POA: Diagnosis not present

## 2016-07-14 DIAGNOSIS — R945 Abnormal results of liver function studies: Secondary | ICD-10-CM | POA: Diagnosis not present

## 2016-07-18 DIAGNOSIS — M25561 Pain in right knee: Secondary | ICD-10-CM | POA: Diagnosis not present

## 2016-07-18 DIAGNOSIS — M4726 Other spondylosis with radiculopathy, lumbar region: Secondary | ICD-10-CM | POA: Diagnosis not present

## 2016-07-18 DIAGNOSIS — G8929 Other chronic pain: Secondary | ICD-10-CM | POA: Diagnosis not present

## 2016-07-21 ENCOUNTER — Other Ambulatory Visit: Payer: Self-pay | Admitting: Nurse Practitioner

## 2016-07-21 DIAGNOSIS — M4726 Other spondylosis with radiculopathy, lumbar region: Secondary | ICD-10-CM

## 2016-07-27 DIAGNOSIS — M159 Polyosteoarthritis, unspecified: Secondary | ICD-10-CM | POA: Diagnosis not present

## 2016-07-27 DIAGNOSIS — I10 Essential (primary) hypertension: Secondary | ICD-10-CM | POA: Diagnosis not present

## 2016-07-28 DIAGNOSIS — C44629 Squamous cell carcinoma of skin of left upper limb, including shoulder: Secondary | ICD-10-CM | POA: Diagnosis not present

## 2016-07-28 DIAGNOSIS — R0600 Dyspnea, unspecified: Secondary | ICD-10-CM | POA: Diagnosis not present

## 2016-07-28 DIAGNOSIS — Z299 Encounter for prophylactic measures, unspecified: Secondary | ICD-10-CM | POA: Diagnosis not present

## 2016-07-28 DIAGNOSIS — C44622 Squamous cell carcinoma of skin of right upper limb, including shoulder: Secondary | ICD-10-CM | POA: Diagnosis not present

## 2016-07-28 DIAGNOSIS — F419 Anxiety disorder, unspecified: Secondary | ICD-10-CM | POA: Diagnosis not present

## 2016-08-02 ENCOUNTER — Ambulatory Visit
Admission: RE | Admit: 2016-08-02 | Discharge: 2016-08-02 | Disposition: A | Discharge status: Home / Self Care | Payer: PPO | Source: Ambulatory Visit | Attending: Nurse Practitioner | Admitting: Nurse Practitioner

## 2016-08-02 DIAGNOSIS — M545 Low back pain: Secondary | ICD-10-CM | POA: Diagnosis not present

## 2016-08-02 DIAGNOSIS — M4726 Other spondylosis with radiculopathy, lumbar region: Secondary | ICD-10-CM

## 2016-08-02 MED ORDER — IOPAMIDOL (ISOVUE-M 200) INJECTION 41%
1.0000 mL | Freq: Once | INTRAMUSCULAR | Status: AC
  Administered 2016-08-02: 1 mL via EPIDURAL

## 2016-08-02 MED ORDER — METHYLPREDNISOLONE ACETATE 40 MG/ML INJ SUSP (RADIOLOG
120.0000 mg | Freq: Once | INTRAMUSCULAR | Status: AC
  Administered 2016-08-02: 120 mg via EPIDURAL

## 2016-08-02 NOTE — Discharge Instructions (Signed)

## 2016-08-05 DIAGNOSIS — R748 Abnormal levels of other serum enzymes: Secondary | ICD-10-CM | POA: Diagnosis not present

## 2016-08-08 DIAGNOSIS — R0602 Shortness of breath: Secondary | ICD-10-CM | POA: Diagnosis not present

## 2016-08-15 DIAGNOSIS — M25532 Pain in left wrist: Secondary | ICD-10-CM | POA: Diagnosis not present

## 2016-08-15 DIAGNOSIS — M545 Low back pain: Secondary | ICD-10-CM | POA: Diagnosis not present

## 2016-08-15 DIAGNOSIS — N281 Cyst of kidney, acquired: Secondary | ICD-10-CM | POA: Diagnosis not present

## 2016-08-15 DIAGNOSIS — Z299 Encounter for prophylactic measures, unspecified: Secondary | ICD-10-CM | POA: Diagnosis not present

## 2016-08-15 DIAGNOSIS — R7989 Other specified abnormal findings of blood chemistry: Secondary | ICD-10-CM | POA: Diagnosis not present

## 2016-08-25 DIAGNOSIS — I10 Essential (primary) hypertension: Secondary | ICD-10-CM | POA: Diagnosis not present

## 2016-08-29 DIAGNOSIS — N281 Cyst of kidney, acquired: Secondary | ICD-10-CM | POA: Diagnosis not present

## 2016-08-29 DIAGNOSIS — K760 Fatty (change of) liver, not elsewhere classified: Secondary | ICD-10-CM | POA: Diagnosis not present

## 2016-09-05 DIAGNOSIS — I10 Essential (primary) hypertension: Secondary | ICD-10-CM | POA: Diagnosis not present

## 2016-09-05 DIAGNOSIS — M159 Polyosteoarthritis, unspecified: Secondary | ICD-10-CM | POA: Diagnosis not present

## 2016-09-06 DIAGNOSIS — R748 Abnormal levels of other serum enzymes: Secondary | ICD-10-CM | POA: Diagnosis not present

## 2016-09-14 DIAGNOSIS — Z0000 Encounter for general adult medical examination without abnormal findings: Secondary | ICD-10-CM | POA: Diagnosis not present

## 2016-09-14 DIAGNOSIS — Z79899 Other long term (current) drug therapy: Secondary | ICD-10-CM | POA: Diagnosis not present

## 2016-09-14 DIAGNOSIS — E7800 Pure hypercholesterolemia, unspecified: Secondary | ICD-10-CM | POA: Diagnosis not present

## 2016-09-14 DIAGNOSIS — R5383 Other fatigue: Secondary | ICD-10-CM | POA: Diagnosis not present

## 2016-09-14 DIAGNOSIS — Z1389 Encounter for screening for other disorder: Secondary | ICD-10-CM | POA: Diagnosis not present

## 2016-09-14 DIAGNOSIS — Z1211 Encounter for screening for malignant neoplasm of colon: Secondary | ICD-10-CM | POA: Diagnosis not present

## 2016-09-14 DIAGNOSIS — Z7189 Other specified counseling: Secondary | ICD-10-CM | POA: Diagnosis not present

## 2016-09-14 DIAGNOSIS — Z299 Encounter for prophylactic measures, unspecified: Secondary | ICD-10-CM | POA: Diagnosis not present

## 2016-09-16 DIAGNOSIS — H2513 Age-related nuclear cataract, bilateral: Secondary | ICD-10-CM | POA: Diagnosis not present

## 2016-09-16 DIAGNOSIS — H40033 Anatomical narrow angle, bilateral: Secondary | ICD-10-CM | POA: Diagnosis not present

## 2016-09-19 DIAGNOSIS — S22000A Wedge compression fracture of unspecified thoracic vertebra, initial encounter for closed fracture: Secondary | ICD-10-CM | POA: Diagnosis not present

## 2016-09-19 DIAGNOSIS — M4726 Other spondylosis with radiculopathy, lumbar region: Secondary | ICD-10-CM | POA: Diagnosis not present

## 2016-09-22 DIAGNOSIS — M4726 Other spondylosis with radiculopathy, lumbar region: Secondary | ICD-10-CM | POA: Diagnosis not present

## 2016-09-29 DIAGNOSIS — I10 Essential (primary) hypertension: Secondary | ICD-10-CM | POA: Diagnosis not present

## 2016-09-29 DIAGNOSIS — M4726 Other spondylosis with radiculopathy, lumbar region: Secondary | ICD-10-CM | POA: Diagnosis not present

## 2016-09-29 DIAGNOSIS — M159 Polyosteoarthritis, unspecified: Secondary | ICD-10-CM | POA: Diagnosis not present

## 2016-10-27 DIAGNOSIS — M4726 Other spondylosis with radiculopathy, lumbar region: Secondary | ICD-10-CM | POA: Diagnosis not present

## 2016-10-28 DIAGNOSIS — I10 Essential (primary) hypertension: Secondary | ICD-10-CM | POA: Diagnosis not present

## 2016-10-28 DIAGNOSIS — M159 Polyosteoarthritis, unspecified: Secondary | ICD-10-CM | POA: Diagnosis not present

## 2016-11-23 DIAGNOSIS — S32000A Wedge compression fracture of unspecified lumbar vertebra, initial encounter for closed fracture: Secondary | ICD-10-CM | POA: Diagnosis not present

## 2016-11-23 DIAGNOSIS — S22000A Wedge compression fracture of unspecified thoracic vertebra, initial encounter for closed fracture: Secondary | ICD-10-CM | POA: Diagnosis not present

## 2016-12-13 DIAGNOSIS — I10 Essential (primary) hypertension: Secondary | ICD-10-CM | POA: Diagnosis not present

## 2016-12-13 DIAGNOSIS — M159 Polyosteoarthritis, unspecified: Secondary | ICD-10-CM | POA: Diagnosis not present

## 2016-12-26 DIAGNOSIS — M25561 Pain in right knee: Secondary | ICD-10-CM | POA: Diagnosis not present

## 2016-12-26 DIAGNOSIS — Z6833 Body mass index (BMI) 33.0-33.9, adult: Secondary | ICD-10-CM | POA: Diagnosis not present

## 2016-12-26 DIAGNOSIS — F419 Anxiety disorder, unspecified: Secondary | ICD-10-CM | POA: Diagnosis not present

## 2016-12-26 DIAGNOSIS — Z713 Dietary counseling and surveillance: Secondary | ICD-10-CM | POA: Diagnosis not present

## 2016-12-26 DIAGNOSIS — K219 Gastro-esophageal reflux disease without esophagitis: Secondary | ICD-10-CM | POA: Diagnosis not present

## 2016-12-26 DIAGNOSIS — Z299 Encounter for prophylactic measures, unspecified: Secondary | ICD-10-CM | POA: Diagnosis not present

## 2016-12-26 DIAGNOSIS — M25569 Pain in unspecified knee: Secondary | ICD-10-CM | POA: Diagnosis not present

## 2016-12-26 DIAGNOSIS — M4854XA Collapsed vertebra, not elsewhere classified, thoracic region, initial encounter for fracture: Secondary | ICD-10-CM | POA: Diagnosis not present

## 2017-01-19 DIAGNOSIS — M159 Polyosteoarthritis, unspecified: Secondary | ICD-10-CM | POA: Diagnosis not present

## 2017-01-19 DIAGNOSIS — I10 Essential (primary) hypertension: Secondary | ICD-10-CM | POA: Diagnosis not present

## 2017-01-23 DIAGNOSIS — G4700 Insomnia, unspecified: Secondary | ICD-10-CM | POA: Diagnosis not present

## 2017-01-23 DIAGNOSIS — Z299 Encounter for prophylactic measures, unspecified: Secondary | ICD-10-CM | POA: Diagnosis not present

## 2017-01-23 DIAGNOSIS — K7689 Other specified diseases of liver: Secondary | ICD-10-CM | POA: Diagnosis not present

## 2017-01-23 DIAGNOSIS — R140 Abdominal distension (gaseous): Secondary | ICD-10-CM | POA: Diagnosis not present

## 2017-01-23 DIAGNOSIS — Z6832 Body mass index (BMI) 32.0-32.9, adult: Secondary | ICD-10-CM | POA: Diagnosis not present

## 2017-01-23 DIAGNOSIS — G629 Polyneuropathy, unspecified: Secondary | ICD-10-CM | POA: Diagnosis not present

## 2017-01-30 DIAGNOSIS — K760 Fatty (change of) liver, not elsewhere classified: Secondary | ICD-10-CM | POA: Diagnosis not present

## 2017-01-30 DIAGNOSIS — H2513 Age-related nuclear cataract, bilateral: Secondary | ICD-10-CM | POA: Diagnosis not present

## 2017-01-30 DIAGNOSIS — R188 Other ascites: Secondary | ICD-10-CM | POA: Diagnosis not present

## 2017-02-06 ENCOUNTER — Encounter (INDEPENDENT_AMBULATORY_CARE_PROVIDER_SITE_OTHER): Payer: Self-pay | Admitting: Internal Medicine

## 2017-02-07 ENCOUNTER — Ambulatory Visit (INDEPENDENT_AMBULATORY_CARE_PROVIDER_SITE_OTHER): Payer: PPO | Admitting: Internal Medicine

## 2017-02-07 ENCOUNTER — Other Ambulatory Visit (INDEPENDENT_AMBULATORY_CARE_PROVIDER_SITE_OTHER): Payer: Self-pay | Admitting: *Deleted

## 2017-02-07 ENCOUNTER — Other Ambulatory Visit (INDEPENDENT_AMBULATORY_CARE_PROVIDER_SITE_OTHER): Payer: Self-pay | Admitting: Internal Medicine

## 2017-02-07 ENCOUNTER — Encounter (INDEPENDENT_AMBULATORY_CARE_PROVIDER_SITE_OTHER): Payer: Self-pay

## 2017-02-07 ENCOUNTER — Encounter (INDEPENDENT_AMBULATORY_CARE_PROVIDER_SITE_OTHER): Payer: Self-pay | Admitting: Internal Medicine

## 2017-02-07 VITALS — BP 140/70 | HR 65 | Temp 98.3°F | Resp 18 | Ht 67.0 in | Wt 199.0 lb

## 2017-02-07 DIAGNOSIS — R945 Abnormal results of liver function studies: Secondary | ICD-10-CM

## 2017-02-07 DIAGNOSIS — K760 Fatty (change of) liver, not elsewhere classified: Secondary | ICD-10-CM | POA: Diagnosis not present

## 2017-02-07 DIAGNOSIS — R188 Other ascites: Secondary | ICD-10-CM

## 2017-02-07 DIAGNOSIS — R7989 Other specified abnormal findings of blood chemistry: Secondary | ICD-10-CM

## 2017-02-07 NOTE — Progress Notes (Signed)
Reason for consultation;  Elevated transaminases and fatty liver.  History of present illness:   Patient is 83 year old Caucasian female who was referred for courtesy of  Heather [name] for evaluation of elevated transaminases. She states she found this few months ago. She had ultrasound last week confirming fatty liver. There is no history of hepatitis. He has never been jaundiced. She denies abdominal pain or pruritus. She states her appetite is fair. She states she has gained close to 40 pounds in the last 2 years. She states she has gained 9 pounds in one month and wonders service due to fluid retention. She has chronic back pain as well as knee and lower extremity pain and unable to walk or exercise. She states she can walk from the office to her car and thus aborted. She does not drink alcohol. She recalls she had colonoscopy about 5 years ago with removal of 2 polyps. Family history is negative for chronic liver disease.   Current Medications: Outpatient Encounter Prescriptions as of 02/07/2017  Medication Sig  . ALPRAZolam (XANAX) 1 MG tablet Take 0.5-1 mg by mouth 2 (two) times daily. 0.5 tablet in the morning and 1 tablet at bedtime  . aspirin 81 MG tablet Take 81 mg by mouth daily.  . chlorthalidone (HYGROTON) 25 MG tablet Take 25 mg by mouth daily.   Marland Kitchen gabapentin (NEURONTIN) 300 MG capsule Take 300 mg by mouth. Patient takes 1 by mouth in the morning , and 2 by mouth at bedtime.  . metoprolol 75 MG TABS Take 50 mg by mouth 2 (two) times daily.  Marland Kitchen omeprazole (PRILOSEC) 40 MG capsule Take 1 capsule by mouth daily.  . polyethylene glycol (MIRALAX / GLYCOLAX) packet Take 17 g by mouth daily.  . [DISCONTINUED] HYDROcodone-acetaminophen (NORCO/VICODIN) 5-325 MG tablet Take 0.5 tablets by mouth 4 (four) times daily as needed for moderate pain.   No facility-administered encounter medications on file as of 02/07/2017.    Past medical history:  Hypertension Chronic GERD. Obesity. She states  she weighed 160 pounds 2 years ago. Now she weighs 199 pounds. Chronic low back pain secondary to degenerative disc disease. Kyphoplasty to T12. Macular degeneration. Osteoarthrosis of both knees. She had intra-articular steroid injection to right knee. Bilateral cataract extraction. C-section 48 years ago. Colonoscopy in October 2013 for heme-positive stool. She had colonic diverticulosis and had 2 small adenomas removed.    Allergies: No Known Allergies.  Family history:  Mother died of bleeding peptic ulcer disease in her 44s. Father had kidney disease and died at age 40. She had 3 brothers and they're all deceased. One brother died of possibly congenital heart disease at age 35. One brother died at age 57 and another brother died at age 89 following hip surgery possibly due to pulmonary embolism. She had 3 sisters and they're all disease. One died of breast cancer rate 70 23-Apr-2004 disease at age 23 and 1 died of uterine cancer in her 72s.   Social history:  She is widowed. She has 5 grownup children. She worked at [employer] or [employer] for 22 years and then at [employer] for 14 years and she retired 3 years ago. She has never smoked cigarettes or drank alcohol. She lives alone. She is still driving. 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   Physical examination: Blood pressure 140/70, pulse 65, temperature 98.3 F (36.8 C), temperature source Oral, resp. rate 18, height 5\' 7"  (1.702 m), weight 199 lb (90.3 kg). Patient is alert and in no acute distress.  Conjunctiva is pink. Sclera is nonicteric Oropharyngeal mucosa is normal. No neck masses or thyromegaly noted. Cardiac exam with regular rhythm normal S1 and S2. No murmur or gallop noted. Lungs are clear to auscultation. Abdomen is obese. On palpation is soft and nontender without organomegaly or masses.  No LE edema or clubbing noted.  Labs/studies Results: AST and ALT 149 and. 126 respectively in January 2016  AST and ALT 56 and 67  respectively in March 2017  AST and ALT 1:15 and 113 respectively in July 2017  AST and ALT 173 and 237 respectively in August 2017  AST and ALT 138 and 147 respectively November 2017   AST and ALT 137 and 121 respectively on 01/24/2017.   Abdominal ultrasound on 01/31/2017 revealed echodense liver consistent with fatty infiltration. Liver span 13 cm. Normal gallbladder and bile duct spleen is within normal limits and no evidence of ascites.    Assessment:  #1. Elevated transaminases most likely secondary to fatty liver well documented on recent abdominal ultrasound. Review of her old records reveal that she is at elevated transaminases dating back to January 2016. She does not have stigmata of chronic liver disease. She has symptoms pertaining to her lower extremity muscles and joints. Therefore need to rule out autoimmune disease. Will also screen for chronic hepatitis B and C. #2. Chronic low back pain as well as lower extremity pain having significant effect on quality of her life. Patient advised to discuss his symptomatology with Heather [name] and see if she will benefit from physical therapy evaluation.   Recommendations:  Patient informed that she has well preserved hepatic function and none of her symptoms are due to fatty liver but will rule out autoimmune hepatitis. Patient advised to increase physical activity as tolerated and if possible may consider water aerobics. She will go to the lab for the following studies: CBC, INR, sedimentation rate, hepatitis C antibody, hepatitis B surface antigen, Serum ferritin ANA and smooth muscle antibody. I will be contacting patient with results of these studies and plan to see her back in 6 months.

## 2017-02-07 NOTE — Patient Instructions (Addendum)
Physician will call with results of blood tests when completed. 

## 2017-02-08 LAB — CBC
HEMATOCRIT: 41 % (ref 35.0–45.0)
HEMOGLOBIN: 13.2 g/dL (ref 11.7–15.5)
MCH: 27.4 pg (ref 27.0–33.0)
MCHC: 32.2 g/dL (ref 32.0–36.0)
MCV: 85.2 fL (ref 80.0–100.0)
MPV: 11.5 fL (ref 7.5–12.5)
Platelets: 227 10*3/uL (ref 140–400)
RBC: 4.81 MIL/uL (ref 3.80–5.10)
RDW: 16.1 % — ABNORMAL HIGH (ref 11.0–15.0)
WBC: 6.4 10*3/uL (ref 3.8–10.8)

## 2017-02-08 LAB — SEDIMENTATION RATE: SED RATE: 8 mm/h (ref 0–30)

## 2017-02-08 LAB — ANTI-NUCLEAR AB-TITER (ANA TITER): ANA Titer 1: 1:160 {titer} — ABNORMAL HIGH

## 2017-02-08 LAB — ANA: ANA: POSITIVE — AB

## 2017-02-08 LAB — ANTI-SMOOTH MUSCLE ANTIBODY, IGG: Smooth Muscle Ab: <20 U (ref <20)

## 2017-02-08 LAB — PROTIME-INR
INR: 1
PROTHROMBIN TIME: 10.4 s (ref 9.0–11.5)

## 2017-02-08 LAB — HEPATITIS C ANTIBODY: HCV Ab: NEGATIVE

## 2017-02-08 LAB — FERRITIN: Ferritin: 25 ng/mL (ref 20–288)

## 2017-02-09 LAB — MITOCHONDRIAL ANTIBODIES

## 2017-02-10 DIAGNOSIS — M159 Polyosteoarthritis, unspecified: Secondary | ICD-10-CM | POA: Diagnosis not present

## 2017-02-10 DIAGNOSIS — I10 Essential (primary) hypertension: Secondary | ICD-10-CM | POA: Diagnosis not present

## 2017-02-13 LAB — IGG: IGG (IMMUNOGLOBIN G), SERUM: 812 mg/dL (ref 694–1618)

## 2017-02-22 ENCOUNTER — Other Ambulatory Visit (INDEPENDENT_AMBULATORY_CARE_PROVIDER_SITE_OTHER): Payer: Self-pay | Admitting: *Deleted

## 2017-02-22 ENCOUNTER — Encounter (INDEPENDENT_AMBULATORY_CARE_PROVIDER_SITE_OTHER): Payer: Self-pay | Admitting: *Deleted

## 2017-02-22 DIAGNOSIS — R7989 Other specified abnormal findings of blood chemistry: Secondary | ICD-10-CM

## 2017-02-22 DIAGNOSIS — K760 Fatty (change of) liver, not elsewhere classified: Secondary | ICD-10-CM

## 2017-02-22 DIAGNOSIS — R945 Abnormal results of liver function studies: Secondary | ICD-10-CM

## 2017-03-06 ENCOUNTER — Encounter (INDEPENDENT_AMBULATORY_CARE_PROVIDER_SITE_OTHER): Payer: Self-pay

## 2017-03-14 DIAGNOSIS — L304 Erythema intertrigo: Secondary | ICD-10-CM | POA: Diagnosis not present

## 2017-03-14 DIAGNOSIS — L299 Pruritus, unspecified: Secondary | ICD-10-CM | POA: Diagnosis not present

## 2017-03-14 DIAGNOSIS — D229 Melanocytic nevi, unspecified: Secondary | ICD-10-CM | POA: Diagnosis not present

## 2017-03-14 DIAGNOSIS — L570 Actinic keratosis: Secondary | ICD-10-CM | POA: Diagnosis not present

## 2017-03-16 ENCOUNTER — Encounter (INDEPENDENT_AMBULATORY_CARE_PROVIDER_SITE_OTHER): Payer: Self-pay

## 2017-03-17 DIAGNOSIS — Z6835 Body mass index (BMI) 35.0-35.9, adult: Secondary | ICD-10-CM | POA: Diagnosis not present

## 2017-03-17 DIAGNOSIS — I10 Essential (primary) hypertension: Secondary | ICD-10-CM | POA: Diagnosis not present

## 2017-03-17 DIAGNOSIS — W57XXXA Bitten or stung by nonvenomous insect and other nonvenomous arthropods, initial encounter: Secondary | ICD-10-CM | POA: Diagnosis not present

## 2017-03-17 DIAGNOSIS — Z713 Dietary counseling and surveillance: Secondary | ICD-10-CM | POA: Diagnosis not present

## 2017-03-17 DIAGNOSIS — Z299 Encounter for prophylactic measures, unspecified: Secondary | ICD-10-CM | POA: Diagnosis not present

## 2017-03-23 ENCOUNTER — Other Ambulatory Visit (INDEPENDENT_AMBULATORY_CARE_PROVIDER_SITE_OTHER): Payer: Self-pay | Admitting: *Deleted

## 2017-03-23 ENCOUNTER — Telehealth (INDEPENDENT_AMBULATORY_CARE_PROVIDER_SITE_OTHER): Payer: Self-pay | Admitting: *Deleted

## 2017-03-23 DIAGNOSIS — K760 Fatty (change of) liver, not elsewhere classified: Secondary | ICD-10-CM

## 2017-03-23 DIAGNOSIS — R945 Abnormal results of liver function studies: Secondary | ICD-10-CM | POA: Diagnosis not present

## 2017-03-23 DIAGNOSIS — R7989 Other specified abnormal findings of blood chemistry: Secondary | ICD-10-CM

## 2017-03-23 NOTE — Telephone Encounter (Signed)
Lab order was sent to the [place].

## 2017-03-24 LAB — HEPATIC FUNCTION PANEL
ALBUMIN: 3.9 g/dL (ref 3.6–5.1)
ALT: 101 U/L — ABNORMAL HIGH (ref 6–29)
AST: 120 U/L — AB (ref 10–35)
Alkaline Phosphatase: 56 U/L (ref 33–130)
BILIRUBIN TOTAL: 0.5 mg/dL (ref 0.2–1.2)
Bilirubin, Direct: 0.1 mg/dL (ref <=0.2)
Indirect Bilirubin: 0.4 mg/dL (ref 0.2–1.2)
TOTAL PROTEIN: 6.4 g/dL (ref 6.1–8.1)

## 2017-04-03 ENCOUNTER — Other Ambulatory Visit (INDEPENDENT_AMBULATORY_CARE_PROVIDER_SITE_OTHER): Payer: Self-pay | Admitting: *Deleted

## 2017-04-03 ENCOUNTER — Encounter (INDEPENDENT_AMBULATORY_CARE_PROVIDER_SITE_OTHER): Payer: Self-pay | Admitting: *Deleted

## 2017-04-03 DIAGNOSIS — R748 Abnormal levels of other serum enzymes: Secondary | ICD-10-CM

## 2017-04-04 DIAGNOSIS — I10 Essential (primary) hypertension: Secondary | ICD-10-CM | POA: Diagnosis not present

## 2017-04-04 DIAGNOSIS — M159 Polyosteoarthritis, unspecified: Secondary | ICD-10-CM | POA: Diagnosis not present

## 2017-04-11 DIAGNOSIS — M549 Dorsalgia, unspecified: Secondary | ICD-10-CM | POA: Diagnosis not present

## 2017-04-11 DIAGNOSIS — I10 Essential (primary) hypertension: Secondary | ICD-10-CM | POA: Diagnosis not present

## 2017-04-11 DIAGNOSIS — M4854XA Collapsed vertebra, not elsewhere classified, thoracic region, initial encounter for fracture: Secondary | ICD-10-CM | POA: Diagnosis not present

## 2017-04-11 DIAGNOSIS — Z299 Encounter for prophylactic measures, unspecified: Secondary | ICD-10-CM | POA: Diagnosis not present

## 2017-04-11 DIAGNOSIS — G629 Polyneuropathy, unspecified: Secondary | ICD-10-CM | POA: Diagnosis not present

## 2017-04-11 DIAGNOSIS — Z6832 Body mass index (BMI) 32.0-32.9, adult: Secondary | ICD-10-CM | POA: Diagnosis not present

## 2017-04-13 ENCOUNTER — Other Ambulatory Visit: Payer: Self-pay | Admitting: Nurse Practitioner

## 2017-04-13 DIAGNOSIS — M4726 Other spondylosis with radiculopathy, lumbar region: Secondary | ICD-10-CM

## 2017-04-17 DIAGNOSIS — R748 Abnormal levels of other serum enzymes: Secondary | ICD-10-CM | POA: Diagnosis not present

## 2017-04-18 LAB — HEPATIC FUNCTION PANEL
ALT: 94 U/L — ABNORMAL HIGH (ref 6–29)
AST: 64 U/L — AB (ref 10–35)
Albumin: 4.1 g/dL (ref 3.6–5.1)
Alkaline Phosphatase: 63 U/L (ref 33–130)
BILIRUBIN INDIRECT: 0.4 mg/dL (ref 0.2–1.2)
Bilirubin, Direct: 0.1 mg/dL (ref <=0.2)
TOTAL PROTEIN: 6.8 g/dL (ref 6.1–8.1)
Total Bilirubin: 0.5 mg/dL (ref 0.2–1.2)

## 2017-04-20 ENCOUNTER — Ambulatory Visit
Admission: RE | Admit: 2017-04-20 | Discharge: 2017-04-20 | Disposition: A | Discharge status: Home / Self Care | Payer: PPO | Source: Ambulatory Visit | Attending: Nurse Practitioner | Admitting: Nurse Practitioner

## 2017-04-20 DIAGNOSIS — M4726 Other spondylosis with radiculopathy, lumbar region: Secondary | ICD-10-CM

## 2017-04-20 DIAGNOSIS — F419 Anxiety disorder, unspecified: Secondary | ICD-10-CM | POA: Diagnosis not present

## 2017-04-20 DIAGNOSIS — Z79899 Other long term (current) drug therapy: Secondary | ICD-10-CM | POA: Diagnosis not present

## 2017-04-20 DIAGNOSIS — R32 Unspecified urinary incontinence: Secondary | ICD-10-CM | POA: Diagnosis not present

## 2017-04-20 DIAGNOSIS — I10 Essential (primary) hypertension: Secondary | ICD-10-CM | POA: Diagnosis not present

## 2017-04-20 DIAGNOSIS — E7800 Pure hypercholesterolemia, unspecified: Secondary | ICD-10-CM | POA: Diagnosis not present

## 2017-04-20 DIAGNOSIS — M545 Low back pain: Secondary | ICD-10-CM | POA: Diagnosis not present

## 2017-04-20 MED ORDER — IOPAMIDOL (ISOVUE-M 200) INJECTION 41%
1.0000 mL | Freq: Once | INTRAMUSCULAR | Status: AC
  Administered 2017-04-20: 1 mL via EPIDURAL

## 2017-04-20 MED ORDER — METHYLPREDNISOLONE ACETATE 40 MG/ML INJ SUSP (RADIOLOG
120.0000 mg | Freq: Once | INTRAMUSCULAR | Status: AC
  Administered 2017-04-20: 120 mg via EPIDURAL

## 2017-04-20 NOTE — Discharge Instructions (Signed)

## 2017-05-02 DIAGNOSIS — I10 Essential (primary) hypertension: Secondary | ICD-10-CM | POA: Diagnosis not present

## 2017-05-02 DIAGNOSIS — M159 Polyosteoarthritis, unspecified: Secondary | ICD-10-CM | POA: Diagnosis not present

## 2017-05-05 DIAGNOSIS — M4854XA Collapsed vertebra, not elsewhere classified, thoracic region, initial encounter for fracture: Secondary | ICD-10-CM | POA: Diagnosis not present

## 2017-05-05 DIAGNOSIS — I10 Essential (primary) hypertension: Secondary | ICD-10-CM | POA: Diagnosis not present

## 2017-05-05 DIAGNOSIS — F419 Anxiety disorder, unspecified: Secondary | ICD-10-CM | POA: Diagnosis not present

## 2017-05-05 DIAGNOSIS — Z299 Encounter for prophylactic measures, unspecified: Secondary | ICD-10-CM | POA: Diagnosis not present

## 2017-05-05 DIAGNOSIS — Z6832 Body mass index (BMI) 32.0-32.9, adult: Secondary | ICD-10-CM | POA: Diagnosis not present

## 2017-05-15 ENCOUNTER — Other Ambulatory Visit (INDEPENDENT_AMBULATORY_CARE_PROVIDER_SITE_OTHER): Payer: Self-pay | Admitting: *Deleted

## 2017-05-15 DIAGNOSIS — R748 Abnormal levels of other serum enzymes: Secondary | ICD-10-CM

## 2017-05-16 DIAGNOSIS — G4700 Insomnia, unspecified: Secondary | ICD-10-CM | POA: Diagnosis not present

## 2017-05-16 DIAGNOSIS — I10 Essential (primary) hypertension: Secondary | ICD-10-CM | POA: Diagnosis not present

## 2017-05-16 DIAGNOSIS — E039 Hypothyroidism, unspecified: Secondary | ICD-10-CM | POA: Diagnosis not present

## 2017-05-16 DIAGNOSIS — Z6831 Body mass index (BMI) 31.0-31.9, adult: Secondary | ICD-10-CM | POA: Diagnosis not present

## 2017-05-16 DIAGNOSIS — E7800 Pure hypercholesterolemia, unspecified: Secondary | ICD-10-CM | POA: Diagnosis not present

## 2017-05-16 DIAGNOSIS — Z299 Encounter for prophylactic measures, unspecified: Secondary | ICD-10-CM | POA: Diagnosis not present

## 2017-05-26 DIAGNOSIS — Z79899 Other long term (current) drug therapy: Secondary | ICD-10-CM | POA: Diagnosis not present

## 2017-05-26 DIAGNOSIS — R531 Weakness: Secondary | ICD-10-CM | POA: Diagnosis not present

## 2017-05-26 DIAGNOSIS — M1990 Unspecified osteoarthritis, unspecified site: Secondary | ICD-10-CM | POA: Diagnosis not present

## 2017-05-26 DIAGNOSIS — M810 Age-related osteoporosis without current pathological fracture: Secondary | ICD-10-CM | POA: Diagnosis not present

## 2017-05-26 DIAGNOSIS — G20 Parkinson's disease: Secondary | ICD-10-CM | POA: Diagnosis not present

## 2017-05-26 DIAGNOSIS — R262 Difficulty in walking, not elsewhere classified: Secondary | ICD-10-CM | POA: Diagnosis not present

## 2017-05-26 DIAGNOSIS — K219 Gastro-esophageal reflux disease without esophagitis: Secondary | ICD-10-CM | POA: Diagnosis not present

## 2017-05-26 DIAGNOSIS — R079 Chest pain, unspecified: Secondary | ICD-10-CM | POA: Diagnosis not present

## 2017-05-26 DIAGNOSIS — R251 Tremor, unspecified: Secondary | ICD-10-CM | POA: Diagnosis not present

## 2017-05-26 DIAGNOSIS — I10 Essential (primary) hypertension: Secondary | ICD-10-CM | POA: Diagnosis not present

## 2017-05-26 DIAGNOSIS — R0602 Shortness of breath: Secondary | ICD-10-CM | POA: Diagnosis not present

## 2017-05-26 DIAGNOSIS — F419 Anxiety disorder, unspecified: Secondary | ICD-10-CM | POA: Diagnosis not present

## 2017-05-26 DIAGNOSIS — E7800 Pure hypercholesterolemia, unspecified: Secondary | ICD-10-CM | POA: Diagnosis not present

## 2017-05-26 DIAGNOSIS — R0789 Other chest pain: Secondary | ICD-10-CM | POA: Diagnosis not present

## 2017-05-27 DIAGNOSIS — I10 Essential (primary) hypertension: Secondary | ICD-10-CM | POA: Diagnosis not present

## 2017-05-27 DIAGNOSIS — R0789 Other chest pain: Secondary | ICD-10-CM | POA: Diagnosis not present

## 2017-05-27 DIAGNOSIS — R0602 Shortness of breath: Secondary | ICD-10-CM | POA: Diagnosis not present

## 2017-05-27 DIAGNOSIS — F419 Anxiety disorder, unspecified: Secondary | ICD-10-CM | POA: Diagnosis not present

## 2017-06-05 DIAGNOSIS — Z299 Encounter for prophylactic measures, unspecified: Secondary | ICD-10-CM | POA: Diagnosis not present

## 2017-06-05 DIAGNOSIS — E7800 Pure hypercholesterolemia, unspecified: Secondary | ICD-10-CM | POA: Diagnosis not present

## 2017-06-05 DIAGNOSIS — E039 Hypothyroidism, unspecified: Secondary | ICD-10-CM | POA: Diagnosis not present

## 2017-06-05 DIAGNOSIS — K219 Gastro-esophageal reflux disease without esophagitis: Secondary | ICD-10-CM | POA: Diagnosis not present

## 2017-06-05 DIAGNOSIS — R0600 Dyspnea, unspecified: Secondary | ICD-10-CM | POA: Diagnosis not present

## 2017-06-05 DIAGNOSIS — I10 Essential (primary) hypertension: Secondary | ICD-10-CM | POA: Diagnosis not present

## 2017-06-05 DIAGNOSIS — Z6832 Body mass index (BMI) 32.0-32.9, adult: Secondary | ICD-10-CM | POA: Diagnosis not present

## 2017-06-05 DIAGNOSIS — F419 Anxiety disorder, unspecified: Secondary | ICD-10-CM | POA: Diagnosis not present

## 2017-06-05 DIAGNOSIS — Z713 Dietary counseling and surveillance: Secondary | ICD-10-CM | POA: Diagnosis not present

## 2017-06-09 ENCOUNTER — Other Ambulatory Visit (INDEPENDENT_AMBULATORY_CARE_PROVIDER_SITE_OTHER): Payer: Self-pay | Admitting: *Deleted

## 2017-06-09 ENCOUNTER — Encounter (INDEPENDENT_AMBULATORY_CARE_PROVIDER_SITE_OTHER): Payer: Self-pay | Admitting: *Deleted

## 2017-06-09 DIAGNOSIS — R748 Abnormal levels of other serum enzymes: Secondary | ICD-10-CM

## 2017-06-19 DIAGNOSIS — M159 Polyosteoarthritis, unspecified: Secondary | ICD-10-CM | POA: Diagnosis not present

## 2017-06-19 DIAGNOSIS — I10 Essential (primary) hypertension: Secondary | ICD-10-CM | POA: Diagnosis not present

## 2017-06-28 DIAGNOSIS — Z6832 Body mass index (BMI) 32.0-32.9, adult: Secondary | ICD-10-CM | POA: Diagnosis not present

## 2017-06-28 DIAGNOSIS — Z299 Encounter for prophylactic measures, unspecified: Secondary | ICD-10-CM | POA: Diagnosis not present

## 2017-06-28 DIAGNOSIS — Y92009 Unspecified place in unspecified non-institutional (private) residence as the place of occurrence of the external cause: Secondary | ICD-10-CM | POA: Diagnosis not present

## 2017-06-28 DIAGNOSIS — M4854XA Collapsed vertebra, not elsewhere classified, thoracic region, initial encounter for fracture: Secondary | ICD-10-CM | POA: Diagnosis not present

## 2017-06-28 DIAGNOSIS — M47816 Spondylosis without myelopathy or radiculopathy, lumbar region: Secondary | ICD-10-CM | POA: Diagnosis not present

## 2017-06-28 DIAGNOSIS — M25552 Pain in left hip: Secondary | ICD-10-CM | POA: Diagnosis not present

## 2017-06-28 DIAGNOSIS — M549 Dorsalgia, unspecified: Secondary | ICD-10-CM | POA: Diagnosis not present

## 2017-06-28 DIAGNOSIS — M25551 Pain in right hip: Secondary | ICD-10-CM | POA: Diagnosis not present

## 2017-06-28 DIAGNOSIS — Z9181 History of falling: Secondary | ICD-10-CM | POA: Diagnosis not present

## 2017-06-28 DIAGNOSIS — E7800 Pure hypercholesterolemia, unspecified: Secondary | ICD-10-CM | POA: Diagnosis not present

## 2017-06-28 DIAGNOSIS — I10 Essential (primary) hypertension: Secondary | ICD-10-CM | POA: Diagnosis not present

## 2017-06-28 DIAGNOSIS — W19XXXA Unspecified fall, initial encounter: Secondary | ICD-10-CM | POA: Diagnosis not present

## 2017-06-29 ENCOUNTER — Other Ambulatory Visit: Payer: Self-pay | Admitting: Nurse Practitioner

## 2017-06-29 DIAGNOSIS — M47816 Spondylosis without myelopathy or radiculopathy, lumbar region: Secondary | ICD-10-CM

## 2017-07-12 ENCOUNTER — Ambulatory Visit
Admission: RE | Admit: 2017-07-12 | Discharge: 2017-07-12 | Disposition: A | Discharge status: Home / Self Care | Payer: PPO | Source: Ambulatory Visit | Attending: Nurse Practitioner | Admitting: Nurse Practitioner

## 2017-07-12 DIAGNOSIS — M545 Low back pain: Secondary | ICD-10-CM | POA: Diagnosis not present

## 2017-07-12 DIAGNOSIS — R748 Abnormal levels of other serum enzymes: Secondary | ICD-10-CM | POA: Diagnosis not present

## 2017-07-12 DIAGNOSIS — M47816 Spondylosis without myelopathy or radiculopathy, lumbar region: Secondary | ICD-10-CM

## 2017-07-12 LAB — HEPATIC FUNCTION PANEL
AG RATIO: 1.6 (calc) (ref 1.0–2.5)
ALKALINE PHOSPHATASE (APISO): 59 U/L (ref 33–130)
ALT: 94 U/L — ABNORMAL HIGH (ref 6–29)
AST: 114 U/L — ABNORMAL HIGH (ref 10–35)
Albumin: 4.2 g/dL (ref 3.6–5.1)
BILIRUBIN INDIRECT: 0.4 mg/dL (ref 0.2–1.2)
Bilirubin, Direct: 0.1 mg/dL (ref 0.0–0.2)
Globulin: 2.6 g/dL (calc) (ref 1.9–3.7)
TOTAL PROTEIN: 6.8 g/dL (ref 6.1–8.1)
Total Bilirubin: 0.5 mg/dL (ref 0.2–1.2)

## 2017-07-12 MED ORDER — IOPAMIDOL (ISOVUE-M 200) INJECTION 41%
1.0000 mL | Freq: Once | INTRAMUSCULAR | Status: AC
  Administered 2017-07-12: 1 mL via EPIDURAL

## 2017-07-12 MED ORDER — METHYLPREDNISOLONE ACETATE 40 MG/ML INJ SUSP (RADIOLOG
120.0000 mg | Freq: Once | INTRAMUSCULAR | Status: AC
  Administered 2017-07-12: 120 mg via EPIDURAL

## 2017-07-19 DIAGNOSIS — Z299 Encounter for prophylactic measures, unspecified: Secondary | ICD-10-CM | POA: Diagnosis not present

## 2017-07-19 DIAGNOSIS — R1310 Dysphagia, unspecified: Secondary | ICD-10-CM | POA: Diagnosis not present

## 2017-07-19 DIAGNOSIS — I10 Essential (primary) hypertension: Secondary | ICD-10-CM | POA: Diagnosis not present

## 2017-07-19 DIAGNOSIS — E039 Hypothyroidism, unspecified: Secondary | ICD-10-CM | POA: Diagnosis not present

## 2017-07-19 DIAGNOSIS — E7800 Pure hypercholesterolemia, unspecified: Secondary | ICD-10-CM | POA: Diagnosis not present

## 2017-07-19 DIAGNOSIS — F419 Anxiety disorder, unspecified: Secondary | ICD-10-CM | POA: Diagnosis not present

## 2017-07-19 DIAGNOSIS — E669 Obesity, unspecified: Secondary | ICD-10-CM | POA: Diagnosis not present

## 2017-07-19 DIAGNOSIS — K219 Gastro-esophageal reflux disease without esophagitis: Secondary | ICD-10-CM | POA: Diagnosis not present

## 2017-07-20 DIAGNOSIS — I10 Essential (primary) hypertension: Secondary | ICD-10-CM | POA: Diagnosis not present

## 2017-07-20 DIAGNOSIS — M159 Polyosteoarthritis, unspecified: Secondary | ICD-10-CM | POA: Diagnosis not present

## 2017-08-07 DIAGNOSIS — S99929A Unspecified injury of unspecified foot, initial encounter: Secondary | ICD-10-CM | POA: Diagnosis not present

## 2017-08-07 DIAGNOSIS — I10 Essential (primary) hypertension: Secondary | ICD-10-CM | POA: Diagnosis not present

## 2017-08-07 DIAGNOSIS — Z299 Encounter for prophylactic measures, unspecified: Secondary | ICD-10-CM | POA: Diagnosis not present

## 2017-08-07 DIAGNOSIS — Z6832 Body mass index (BMI) 32.0-32.9, adult: Secondary | ICD-10-CM | POA: Diagnosis not present

## 2017-08-08 ENCOUNTER — Encounter (INDEPENDENT_AMBULATORY_CARE_PROVIDER_SITE_OTHER): Payer: Self-pay | Admitting: Internal Medicine

## 2017-08-08 ENCOUNTER — Encounter (INDEPENDENT_AMBULATORY_CARE_PROVIDER_SITE_OTHER): Payer: Self-pay

## 2017-08-08 ENCOUNTER — Ambulatory Visit (INDEPENDENT_AMBULATORY_CARE_PROVIDER_SITE_OTHER): Payer: PPO | Admitting: Internal Medicine

## 2017-08-08 VITALS — BP 120/68 | HR 66 | Temp 97.8°F | Resp 18 | Ht 67.0 in | Wt 194.9 lb

## 2017-08-08 DIAGNOSIS — R945 Abnormal results of liver function studies: Secondary | ICD-10-CM

## 2017-08-08 DIAGNOSIS — R7989 Other specified abnormal findings of blood chemistry: Secondary | ICD-10-CM

## 2017-08-08 DIAGNOSIS — K760 Fatty (change of) liver, not elsewhere classified: Secondary | ICD-10-CM | POA: Diagnosis not present

## 2017-08-08 LAB — HEPATIC FUNCTION PANEL
AG RATIO: 1.5 (calc) (ref 1.0–2.5)
ALBUMIN MSPROF: 4.3 g/dL (ref 3.6–5.1)
ALT: 75 U/L — ABNORMAL HIGH (ref 6–29)
AST: 89 U/L — ABNORMAL HIGH (ref 10–35)
Alkaline phosphatase (APISO): 67 U/L (ref 33–130)
BILIRUBIN DIRECT: 0.1 mg/dL (ref 0.0–0.2)
BILIRUBIN INDIRECT: 0.6 mg/dL (ref 0.2–1.2)
BILIRUBIN TOTAL: 0.7 mg/dL (ref 0.2–1.2)
GLOBULIN: 2.8 g/dL (ref 1.9–3.7)
TOTAL PROTEIN: 7.1 g/dL (ref 6.1–8.1)

## 2017-08-08 NOTE — Patient Instructions (Signed)
Physician will call with results of blood tests when completed. 

## 2017-08-08 NOTE — Progress Notes (Signed)
Presenting complaint;  Follow-up for elevated transaminases.  Subjective:  Patient is 83 year old Caucasian female who is here for scheduled visit. She was last seen about 6 months ago. She has mildly elevated transaminases felt to be secondary to fatty liver well documented on ultrasound exam. She denies abdominal pain. However she complains of post prandial epigastric pressure. She states she has changed her eating habits. She states her niece who is a retired Marine scientist has been helping her with dietary changes and she believes it is helping. She has lost 5 pounds since she made this change. She denies nausea or vomiting. She says bowels move with MiraLAX. Also complains of back pain. She had cortisone shot 3 weeks ago. She also complains of intermittent exertional dyspnea. She feels her weight gain may may be the reason for this. She also complains of numbness in fingers of both hands. She has been told she has carpal tunnel syndrome but she does not want to undergo surgery. She injured her right great toe and is received to see Dr. Woody Seller tomorrow.    Current Medications: Outpatient Encounter Prescriptions as of 08/08/2017  Medication Sig  . ALPRAZolam (XANAX) 1 MG tablet Take 0.5-1 mg by mouth 2 (two) times daily. 0.5 tablet in the morning and 1 tablet at bedtime  . amLODipine (NORVASC) 10 MG tablet Take 10 mg by mouth daily.  Marland Kitchen aspirin 81 MG tablet Take 81 mg by mouth daily.  . chlorthalidone (HYGROTON) 25 MG tablet Take 25 mg by mouth daily.   Marland Kitchen gabapentin (NEURONTIN) 300 MG capsule Take 300 mg by mouth. Patient takes 1 by mouth in the morning , and 2 by mouth at bedtime.  . metoprolol 75 MG TABS Take 50 mg by mouth 2 (two) times daily. (Patient taking differently: Take 25 mg by mouth daily. )  . omeprazole (PRILOSEC) 40 MG capsule Take 1 capsule by mouth daily.  . polyethylene glycol (MIRALAX / GLYCOLAX) packet Take 17 g by mouth daily.   No facility-administered encounter medications on file  as of 08/08/2017.      Objective: Blood pressure 120/68, pulse 66, temperature 97.8 F (36.6 C), temperature source Oral, resp. rate 18, height 5\' 7"  (1.702 m), weight 194 lb 14.4 oz (88.4 kg). Patient is alert and in no acute distress. Conjunctiva is pink. Sclera is nonicteric Oropharyngeal mucosa is normal. No neck masses or thyromegaly noted. Cardiac exam with regular rhythm normal S1 and S2. No murmur or gallop noted. Lungs are clear to auscultation. Abdomen is full. On palpation is soft and nontender without organomegaly or masses.  No LE edema or clubbing noted.  Labs/studies Results: Lab data from 02/07/2017    IgG 812 which is normal ANA positive at a titer 1-160 with homogeneous pattern. Sedimentation rate 8. Smooth muscle antibiotic less than 20. Anti-mitochondrial antibody negative.   Lab data from 07/12/2017 AST 114 and ALT 94  Lab data from 04/17/2017 AST 64 and ALT 94  Lab data from 03/23/2017 AST 120 and ALT 101  Assessment:  #1. Elevated transaminases with fluctuating pattern most likely secondary to NASH. ANA is positive with homogeneous pattern but I believe this is nonspecific and not indicated of autoimmune hepatitis. However if transaminases continue to rise empiric therapy for autoimmune hepatitis may be indicated. If transaminitis is secondary to fatty liver transaminases should improve with dietary changes and gradual weight loss.   Plan:  Patient reassured. She will go to the lab for LFTs. Office visit in 6 months.

## 2017-08-09 ENCOUNTER — Other Ambulatory Visit (INDEPENDENT_AMBULATORY_CARE_PROVIDER_SITE_OTHER): Payer: Self-pay | Admitting: *Deleted

## 2017-08-09 DIAGNOSIS — M79674 Pain in right toe(s): Secondary | ICD-10-CM | POA: Diagnosis not present

## 2017-08-09 DIAGNOSIS — R748 Abnormal levels of other serum enzymes: Secondary | ICD-10-CM

## 2017-08-09 DIAGNOSIS — L600 Ingrowing nail: Secondary | ICD-10-CM | POA: Diagnosis not present

## 2017-08-09 DIAGNOSIS — L03031 Cellulitis of right toe: Secondary | ICD-10-CM | POA: Diagnosis not present

## 2017-08-22 DIAGNOSIS — I10 Essential (primary) hypertension: Secondary | ICD-10-CM | POA: Diagnosis not present

## 2017-08-22 DIAGNOSIS — M159 Polyosteoarthritis, unspecified: Secondary | ICD-10-CM | POA: Diagnosis not present

## 2017-08-23 DIAGNOSIS — M79674 Pain in right toe(s): Secondary | ICD-10-CM | POA: Diagnosis not present

## 2017-08-23 DIAGNOSIS — L03031 Cellulitis of right toe: Secondary | ICD-10-CM | POA: Diagnosis not present

## 2017-08-23 DIAGNOSIS — L600 Ingrowing nail: Secondary | ICD-10-CM | POA: Diagnosis not present

## 2017-08-29 DIAGNOSIS — H353131 Nonexudative age-related macular degeneration, bilateral, early dry stage: Secondary | ICD-10-CM | POA: Diagnosis not present

## 2017-08-29 DIAGNOSIS — H40013 Open angle with borderline findings, low risk, bilateral: Secondary | ICD-10-CM | POA: Diagnosis not present

## 2017-08-29 DIAGNOSIS — Z961 Presence of intraocular lens: Secondary | ICD-10-CM | POA: Diagnosis not present

## 2017-08-29 DIAGNOSIS — H35342 Macular cyst, hole, or pseudohole, left eye: Secondary | ICD-10-CM | POA: Diagnosis not present

## 2017-08-29 DIAGNOSIS — H35033 Hypertensive retinopathy, bilateral: Secondary | ICD-10-CM | POA: Diagnosis not present

## 2017-08-31 DIAGNOSIS — I10 Essential (primary) hypertension: Secondary | ICD-10-CM | POA: Diagnosis not present

## 2017-08-31 DIAGNOSIS — M159 Polyosteoarthritis, unspecified: Secondary | ICD-10-CM | POA: Diagnosis not present

## 2017-09-21 DIAGNOSIS — F321 Major depressive disorder, single episode, moderate: Secondary | ICD-10-CM | POA: Diagnosis not present

## 2017-09-21 DIAGNOSIS — Z299 Encounter for prophylactic measures, unspecified: Secondary | ICD-10-CM | POA: Diagnosis not present

## 2017-09-21 DIAGNOSIS — Z6833 Body mass index (BMI) 33.0-33.9, adult: Secondary | ICD-10-CM | POA: Diagnosis not present

## 2017-09-21 DIAGNOSIS — Z1211 Encounter for screening for malignant neoplasm of colon: Secondary | ICD-10-CM | POA: Diagnosis not present

## 2017-09-21 DIAGNOSIS — R5383 Other fatigue: Secondary | ICD-10-CM | POA: Diagnosis not present

## 2017-09-21 DIAGNOSIS — Z1331 Encounter for screening for depression: Secondary | ICD-10-CM | POA: Diagnosis not present

## 2017-09-21 DIAGNOSIS — Z1339 Encounter for screening examination for other mental health and behavioral disorders: Secondary | ICD-10-CM | POA: Diagnosis not present

## 2017-09-21 DIAGNOSIS — Z0000 Encounter for general adult medical examination without abnormal findings: Secondary | ICD-10-CM | POA: Diagnosis not present

## 2017-09-21 DIAGNOSIS — E039 Hypothyroidism, unspecified: Secondary | ICD-10-CM | POA: Diagnosis not present

## 2017-09-21 DIAGNOSIS — Z79899 Other long term (current) drug therapy: Secondary | ICD-10-CM | POA: Diagnosis not present

## 2017-09-21 DIAGNOSIS — E7800 Pure hypercholesterolemia, unspecified: Secondary | ICD-10-CM | POA: Diagnosis not present

## 2017-09-21 DIAGNOSIS — I10 Essential (primary) hypertension: Secondary | ICD-10-CM | POA: Diagnosis not present

## 2017-09-21 DIAGNOSIS — Z7189 Other specified counseling: Secondary | ICD-10-CM | POA: Diagnosis not present

## 2017-09-26 DIAGNOSIS — R7309 Other abnormal glucose: Secondary | ICD-10-CM | POA: Diagnosis not present

## 2017-09-26 DIAGNOSIS — Z6832 Body mass index (BMI) 32.0-32.9, adult: Secondary | ICD-10-CM | POA: Diagnosis not present

## 2017-09-26 DIAGNOSIS — C44311 Basal cell carcinoma of skin of nose: Secondary | ICD-10-CM | POA: Diagnosis not present

## 2017-09-26 DIAGNOSIS — Z299 Encounter for prophylactic measures, unspecified: Secondary | ICD-10-CM | POA: Diagnosis not present

## 2017-09-26 DIAGNOSIS — I10 Essential (primary) hypertension: Secondary | ICD-10-CM | POA: Diagnosis not present

## 2017-10-20 ENCOUNTER — Other Ambulatory Visit (INDEPENDENT_AMBULATORY_CARE_PROVIDER_SITE_OTHER): Payer: Self-pay | Admitting: *Deleted

## 2017-10-20 ENCOUNTER — Encounter (INDEPENDENT_AMBULATORY_CARE_PROVIDER_SITE_OTHER): Payer: Self-pay | Admitting: *Deleted

## 2017-10-20 DIAGNOSIS — C44311 Basal cell carcinoma of skin of nose: Secondary | ICD-10-CM | POA: Diagnosis not present

## 2017-10-20 DIAGNOSIS — I10 Essential (primary) hypertension: Secondary | ICD-10-CM | POA: Diagnosis not present

## 2017-10-20 DIAGNOSIS — R748 Abnormal levels of other serum enzymes: Secondary | ICD-10-CM

## 2017-10-20 DIAGNOSIS — M159 Polyosteoarthritis, unspecified: Secondary | ICD-10-CM | POA: Diagnosis not present

## 2017-10-20 DIAGNOSIS — Z6832 Body mass index (BMI) 32.0-32.9, adult: Secondary | ICD-10-CM | POA: Diagnosis not present

## 2017-10-20 DIAGNOSIS — Z299 Encounter for prophylactic measures, unspecified: Secondary | ICD-10-CM | POA: Diagnosis not present

## 2017-10-20 DIAGNOSIS — C4491 Basal cell carcinoma of skin, unspecified: Secondary | ICD-10-CM | POA: Diagnosis not present

## 2017-11-09 DIAGNOSIS — R748 Abnormal levels of other serum enzymes: Secondary | ICD-10-CM | POA: Diagnosis not present

## 2017-11-09 LAB — HEPATIC FUNCTION PANEL
AG RATIO: 1.7 (calc) (ref 1.0–2.5)
ALT: 91 U/L — AB (ref 6–29)
AST: 111 U/L — ABNORMAL HIGH (ref 10–35)
Albumin: 4.3 g/dL (ref 3.6–5.1)
Alkaline phosphatase (APISO): 69 U/L (ref 33–130)
BILIRUBIN INDIRECT: 0.4 mg/dL (ref 0.2–1.2)
Bilirubin, Direct: 0.1 mg/dL (ref 0.0–0.2)
Globulin: 2.6 g/dL (calc) (ref 1.9–3.7)
TOTAL PROTEIN: 6.9 g/dL (ref 6.1–8.1)
Total Bilirubin: 0.5 mg/dL (ref 0.2–1.2)

## 2017-11-16 DIAGNOSIS — F419 Anxiety disorder, unspecified: Secondary | ICD-10-CM | POA: Diagnosis not present

## 2017-11-16 DIAGNOSIS — E2839 Other primary ovarian failure: Secondary | ICD-10-CM | POA: Diagnosis not present

## 2017-11-16 DIAGNOSIS — Z299 Encounter for prophylactic measures, unspecified: Secondary | ICD-10-CM | POA: Diagnosis not present

## 2017-11-16 DIAGNOSIS — Z713 Dietary counseling and surveillance: Secondary | ICD-10-CM | POA: Diagnosis not present

## 2017-11-16 DIAGNOSIS — Z6832 Body mass index (BMI) 32.0-32.9, adult: Secondary | ICD-10-CM | POA: Diagnosis not present

## 2017-11-16 DIAGNOSIS — I10 Essential (primary) hypertension: Secondary | ICD-10-CM | POA: Diagnosis not present

## 2017-11-21 ENCOUNTER — Other Ambulatory Visit (INDEPENDENT_AMBULATORY_CARE_PROVIDER_SITE_OTHER): Payer: Self-pay | Admitting: Internal Medicine

## 2017-11-21 DIAGNOSIS — R1084 Generalized abdominal pain: Secondary | ICD-10-CM

## 2017-11-21 DIAGNOSIS — I10 Essential (primary) hypertension: Secondary | ICD-10-CM | POA: Diagnosis not present

## 2017-11-21 DIAGNOSIS — M159 Polyosteoarthritis, unspecified: Secondary | ICD-10-CM | POA: Diagnosis not present

## 2017-11-27 ENCOUNTER — Ambulatory Visit (HOSPITAL_COMMUNITY)
Admission: RE | Admit: 2017-11-27 | Discharge: 2017-11-27 | Disposition: A | Discharge status: Home / Self Care | Payer: PPO | Source: Ambulatory Visit | Attending: Internal Medicine | Admitting: Internal Medicine

## 2017-11-27 DIAGNOSIS — R1084 Generalized abdominal pain: Secondary | ICD-10-CM | POA: Diagnosis not present

## 2017-11-27 DIAGNOSIS — R109 Unspecified abdominal pain: Secondary | ICD-10-CM | POA: Diagnosis not present

## 2017-12-05 ENCOUNTER — Ambulatory Visit (INDEPENDENT_AMBULATORY_CARE_PROVIDER_SITE_OTHER): Payer: PPO | Admitting: Internal Medicine

## 2017-12-05 ENCOUNTER — Encounter (INDEPENDENT_AMBULATORY_CARE_PROVIDER_SITE_OTHER): Payer: Self-pay | Admitting: Internal Medicine

## 2017-12-05 VITALS — BP 118/70 | HR 68 | Temp 98.4°F | Resp 18 | Ht 67.0 in | Wt 199.0 lb

## 2017-12-05 DIAGNOSIS — R7989 Other specified abnormal findings of blood chemistry: Secondary | ICD-10-CM

## 2017-12-05 DIAGNOSIS — K219 Gastro-esophageal reflux disease without esophagitis: Secondary | ICD-10-CM | POA: Diagnosis not present

## 2017-12-05 DIAGNOSIS — I10 Essential (primary) hypertension: Secondary | ICD-10-CM | POA: Diagnosis not present

## 2017-12-05 DIAGNOSIS — R945 Abnormal results of liver function studies: Secondary | ICD-10-CM | POA: Diagnosis not present

## 2017-12-05 DIAGNOSIS — M159 Polyosteoarthritis, unspecified: Secondary | ICD-10-CM | POA: Diagnosis not present

## 2017-12-05 DIAGNOSIS — K760 Fatty (change of) liver, not elsewhere classified: Secondary | ICD-10-CM | POA: Diagnosis not present

## 2017-12-05 MED ORDER — PANTOPRAZOLE SODIUM 40 MG PO TBEC: 40.0000 mg | DELAYED_RELEASE_TABLET | Freq: Every day | ORAL | 5 refills | Status: DC

## 2017-12-05 NOTE — Patient Instructions (Addendum)
Next blood work in 8 weeks. Can take Pepcid OTC 20 mg or Zantac OTC 150 mg at bedtime on as-needed basis

## 2017-12-05 NOTE — Progress Notes (Signed)
Presenting complaint;  Follow-up for elevated transaminases. Patient complains of postprandial fullness.  Database subjective:  Patient is 84 year old Caucasian female who is here for scheduled visit accompanied by her daughter Heather [name].  She has a history of elevated transaminases for at least 3 years.  She had AST of 149 and ALT of 126 on 10/25/2014.  Transaminases have been elevated since.  He was initially seen by me on 02/07/2017.  It was felt elevated transaminases were due to fatty liver.  She was screened for other disorders.  Sed rate and smooth and antibody were normal.  ANA was positive.  Serum ferritin, antimitochondrial antibody as well as hepatitis C antibody were negative.  Hepatitis B surface antigen was ordered but not done. Patient has been getting periodic LFTs. She had ultrasound last week.  Patient complains of postprandial fullness and she feels food is staying in her epigastric region.  She denies dysphagia or heartburn.  She also denies nausea or vomiting.  She denies right upper quadrant abdominal pain.  She states she has not been able to exercise on account of her back pain and bilateral knee pain.  She is trying to watch food intake.  He has gained 5 pounds since her last visit about 4 months ago.   Current Medications: Outpatient Encounter Medications as of 12/05/2017  Medication Sig  . ALPRAZolam (XANAX) 1 MG tablet Take 0.5-1 mg by mouth 2 (two) times daily. 0.5 tablet in the morning and 1 tablet at bedtime  . amLODipine (NORVASC) 10 MG tablet Take 10 mg by mouth daily.  Marland Kitchen aspirin 81 MG tablet Take 81 mg by mouth daily.  . Calcium Carb-Cholecalciferol (CALCIUM 600+D3 PO) Take 600 Units by mouth daily. Includes Zinc also.  . chlorthalidone (HYGROTON) 25 MG tablet Take 25 mg by mouth daily.   . metoprolol 75 MG TABS Take 50 mg by mouth 2 (two) times daily. (Patient taking differently: Take 25 mg by mouth daily. )  . omeprazole (PRILOSEC) 40 MG capsule Take 1 capsule by  mouth daily.  . polyethylene glycol (MIRALAX / GLYCOLAX) packet Take 17 g by mouth daily.  . [DISCONTINUED] gabapentin (NEURONTIN) 300 MG capsule Take 300 mg by mouth. Patient takes 1 by mouth in the morning , and 2 by mouth at bedtime.   No facility-administered encounter medications on file as of 12/05/2017.     Objective: Blood pressure 118/70, pulse 68, temperature 98.4 F (36.9 C), temperature source Oral, resp. rate 18, height 5\' 7"  (1.702 m), weight 199 lb (90.3 kg). Patient is alert and in no acute distress. Conjunctiva is pink. Sclera is nonicteric Oropharyngeal mucosa is normal. No neck masses or thyromegaly noted. Cardiac exam with regular rhythm normal S1 and S2. No murmur or gallop noted. Lungs are clear to auscultation. Abdomen is full.  She has mild midepigastric tenderness.  Liver edge is indistinct below RCM.  It does not appear to be enlarged.  Spleen is nonpalpable. No LE edema or clubbing noted.  Labs/studies Results:   Lab data from Nov 19, 2017 Bilirubin 0.5, AP 69, AST 111, ALT 91, total protein 6.9 and albumin 4.3.  Lab data from 08/08/2017 AST 89 and ALT 75.  Lab data from 07/12/2017 AST 114 and ALT 94.  Ultrasound from 11/27/2017 Gallbladder without wall thickening or stones.  Bile duct 4 mm.  Liver without focal abnormalities.  Ultrasound from 01/30/2017 Echogenic liver consistent with fatty liver.   Assessment:  #1.  Elevated transaminases.  She has had elevated transaminases for about 3  years about 2-3 fold elevated.  Biochemical markers for autoimmune hepatitis hemochromatosis as well as hep C negative Positive ANA but normal sed rate and smooth muscle antibody.  Hepatitis B surface antigen status unknown.  She is at low risk. Doubt autoimmune hepatitis given clinical course and less likely drug-induced hepatitis.  If hepatic injury secondary to fatty liver increase physical activity as tolerated should help.  Empiric therapy with prednisone not  recommended at this time the potential side effects such as weight gain.  #2. GERD.  Was not well controlled with omeprazole.  Will switch her to another PPI and if it does not work either will consider diagnostic EGD.   Plan:  Discontinue omeprazole.   Pantoprazole 40 mg p.o. every morning. LFTs and hepatitis B surface antigen in 2 months. Patient's daughter will take her to [facility] 2-3 times a week so she can exercise using her upper extremities for 20 to 30 minutes at a low intensity. Office visit in 6 months.

## 2017-12-22 DIAGNOSIS — M159 Polyosteoarthritis, unspecified: Secondary | ICD-10-CM | POA: Diagnosis not present

## 2017-12-22 DIAGNOSIS — I10 Essential (primary) hypertension: Secondary | ICD-10-CM | POA: Diagnosis not present

## 2018-01-15 ENCOUNTER — Other Ambulatory Visit (INDEPENDENT_AMBULATORY_CARE_PROVIDER_SITE_OTHER): Payer: Self-pay | Admitting: *Deleted

## 2018-01-15 ENCOUNTER — Encounter (INDEPENDENT_AMBULATORY_CARE_PROVIDER_SITE_OTHER): Payer: Self-pay | Admitting: *Deleted

## 2018-01-15 DIAGNOSIS — K760 Fatty (change of) liver, not elsewhere classified: Secondary | ICD-10-CM

## 2018-01-18 DIAGNOSIS — E669 Obesity, unspecified: Secondary | ICD-10-CM | POA: Diagnosis not present

## 2018-01-18 DIAGNOSIS — Z6833 Body mass index (BMI) 33.0-33.9, adult: Secondary | ICD-10-CM | POA: Diagnosis not present

## 2018-01-18 DIAGNOSIS — I10 Essential (primary) hypertension: Secondary | ICD-10-CM | POA: Diagnosis not present

## 2018-01-18 DIAGNOSIS — K219 Gastro-esophageal reflux disease without esophagitis: Secondary | ICD-10-CM | POA: Diagnosis not present

## 2018-01-18 DIAGNOSIS — H6120 Impacted cerumen, unspecified ear: Secondary | ICD-10-CM | POA: Diagnosis not present

## 2018-01-18 DIAGNOSIS — Z299 Encounter for prophylactic measures, unspecified: Secondary | ICD-10-CM | POA: Diagnosis not present

## 2018-01-21 DIAGNOSIS — K224 Dyskinesia of esophagus: Secondary | ICD-10-CM | POA: Diagnosis not present

## 2018-01-21 DIAGNOSIS — I10 Essential (primary) hypertension: Secondary | ICD-10-CM | POA: Diagnosis not present

## 2018-01-21 DIAGNOSIS — M542 Cervicalgia: Secondary | ICD-10-CM | POA: Diagnosis not present

## 2018-01-21 DIAGNOSIS — R110 Nausea: Secondary | ICD-10-CM | POA: Diagnosis not present

## 2018-01-21 DIAGNOSIS — R51 Headache: Secondary | ICD-10-CM | POA: Diagnosis not present

## 2018-01-21 DIAGNOSIS — Z79899 Other long term (current) drug therapy: Secondary | ICD-10-CM | POA: Diagnosis not present

## 2018-01-21 DIAGNOSIS — K219 Gastro-esophageal reflux disease without esophagitis: Secondary | ICD-10-CM | POA: Diagnosis not present

## 2018-01-21 DIAGNOSIS — R0602 Shortness of breath: Secondary | ICD-10-CM | POA: Diagnosis not present

## 2018-01-21 DIAGNOSIS — E7800 Pure hypercholesterolemia, unspecified: Secondary | ICD-10-CM | POA: Diagnosis not present

## 2018-01-21 DIAGNOSIS — H8110 Benign paroxysmal vertigo, unspecified ear: Secondary | ICD-10-CM | POA: Diagnosis not present

## 2018-01-21 DIAGNOSIS — R079 Chest pain, unspecified: Secondary | ICD-10-CM | POA: Diagnosis not present

## 2018-01-21 DIAGNOSIS — M810 Age-related osteoporosis without current pathological fracture: Secondary | ICD-10-CM | POA: Diagnosis not present

## 2018-01-21 DIAGNOSIS — F419 Anxiety disorder, unspecified: Secondary | ICD-10-CM | POA: Diagnosis not present

## 2018-01-21 DIAGNOSIS — M1990 Unspecified osteoarthritis, unspecified site: Secondary | ICD-10-CM | POA: Diagnosis not present

## 2018-01-22 DIAGNOSIS — H8110 Benign paroxysmal vertigo, unspecified ear: Secondary | ICD-10-CM | POA: Diagnosis not present

## 2018-01-22 DIAGNOSIS — K224 Dyskinesia of esophagus: Secondary | ICD-10-CM | POA: Diagnosis not present

## 2018-01-22 DIAGNOSIS — R079 Chest pain, unspecified: Secondary | ICD-10-CM | POA: Diagnosis not present

## 2018-01-22 DIAGNOSIS — K219 Gastro-esophageal reflux disease without esophagitis: Secondary | ICD-10-CM | POA: Diagnosis not present

## 2018-01-24 ENCOUNTER — Other Ambulatory Visit: Payer: Self-pay

## 2018-01-24 NOTE — Patient Outreach (Signed)
[facility]) Care Management  01/24/2018  Heather [name] [birth year] [id number]   Referral Date: 01/24/18 Referral Source: [facility] report Date of Admission: 01/21/18 Diagnosis: Patient unable to tell Date of Discharge: 01/22/18 Facility: [place]: [facility]  Outreach attempt # 1 Spoke with patient. She is able to verify HIPAA. Patient reports that she does not feel good and waiting for her daughter to come and pick her up and take her to [facility] as she states [facility] did nothing for her and she is still sick.  Patient reports that her ears are stopped up and that she cannot stop shaking. Patient also states she has fatty liver disease and that her stomach is swollen. Asked patient had she contacted her PCP office.  She states there was no [facility] CM will be notified of admission. She verbalized understanding.   Plan: RN CM will follow up with patient within 4 business days.    [name], RN, MSN [facility] Care Management Care Management Coordinator Direct Line [phone] Toll Free: [phone]  Fax: [phone]

## 2018-01-26 ENCOUNTER — Other Ambulatory Visit: Payer: Self-pay

## 2018-01-26 NOTE — Patient Outreach (Signed)
[name]) Care Management  01/26/2018  Heather [name] 22-Aug-1931 [id number]   Referral Date: 01/24/18 Referral Source: HTA Report Date of Admission: 01/21/18 Diagnosis: Patient reports she had some shortness of breath Date of Discharge:01/22/18 Facility: [facility] Insurance: HTA  Outreach attempt # 2 Spoke with patient she is able to verify HIPAA.  Patient reports she is doing ok.  Asked patient if she went back to the hospital she said she did not because she started feeling better.  Patient states also that her daughter has been coming by frequently to check on her.  Patient reports she originally was in the ER only for observation and then sent home.  Patient reports she will follow up with her primary care doctor next week.  Social: Patient lives alone but daughter frequents patient home per patient.   Conditions: Patient reports she went to the ER because she had some problems getting her breath and that her ears were stopped up.  She states that they rinsed her ears in the ER and they felt some better. Patient denies any problems with shortness of breath.  Patient reports she has HTN and Fatty liver. Patient reports that she sees a doctor concerning her liver and next appointment is 02-14-18  Medications: Patient able to review her medications and offers no questions or concerns about medications.    Appointments: Patient to see PCP on Monday for follow up and her daughter will be taking her.    Consent: RN CM reviewed [name] services with patient. Patient declined services at this time  Plan: RN CM will send letter and brochure. RN CM will close case.     Heather Baseman, RN, MSN [facility] Care Management Care Management Coordinator Direct Line [phone] Toll Free: [phone]  Fax: [phone]

## 2018-01-29 DIAGNOSIS — I10 Essential (primary) hypertension: Secondary | ICD-10-CM | POA: Diagnosis not present

## 2018-01-29 DIAGNOSIS — F419 Anxiety disorder, unspecified: Secondary | ICD-10-CM | POA: Diagnosis not present

## 2018-01-29 DIAGNOSIS — Z6832 Body mass index (BMI) 32.0-32.9, adult: Secondary | ICD-10-CM | POA: Diagnosis not present

## 2018-01-29 DIAGNOSIS — Z299 Encounter for prophylactic measures, unspecified: Secondary | ICD-10-CM | POA: Diagnosis not present

## 2018-01-29 DIAGNOSIS — F321 Major depressive disorder, single episode, moderate: Secondary | ICD-10-CM | POA: Diagnosis not present

## 2018-01-29 DIAGNOSIS — R0789 Other chest pain: Secondary | ICD-10-CM | POA: Diagnosis not present

## 2018-01-29 DIAGNOSIS — R42 Dizziness and giddiness: Secondary | ICD-10-CM | POA: Diagnosis not present

## 2018-01-29 DIAGNOSIS — Z09 Encounter for follow-up examination after completed treatment for conditions other than malignant neoplasm: Secondary | ICD-10-CM | POA: Diagnosis not present

## 2018-02-02 DIAGNOSIS — K760 Fatty (change of) liver, not elsewhere classified: Secondary | ICD-10-CM | POA: Diagnosis not present

## 2018-02-02 DIAGNOSIS — I10 Essential (primary) hypertension: Secondary | ICD-10-CM | POA: Diagnosis not present

## 2018-02-02 DIAGNOSIS — M159 Polyosteoarthritis, unspecified: Secondary | ICD-10-CM | POA: Diagnosis not present

## 2018-02-04 LAB — HEPATIC FUNCTION PANEL
AG RATIO: 1.6 (calc) (ref 1.0–2.5)
ALBUMIN MSPROF: 4.2 g/dL (ref 3.6–5.1)
ALT: 86 U/L — ABNORMAL HIGH (ref 6–29)
AST: 111 U/L — ABNORMAL HIGH (ref 10–35)
Alkaline phosphatase (APISO): 70 U/L (ref 33–130)
BILIRUBIN DIRECT: 0.1 mg/dL (ref 0.0–0.2)
BILIRUBIN TOTAL: 0.5 mg/dL (ref 0.2–1.2)
GLOBULIN: 2.7 g/dL (ref 1.9–3.7)
Indirect Bilirubin: 0.4 mg/dL (calc) (ref 0.2–1.2)
Total Protein: 6.9 g/dL (ref 6.1–8.1)

## 2018-02-04 LAB — HEPATITIS B SURFACE ANTIGEN: Hepatitis B Surface Ag: NONREACTIVE

## 2018-02-06 ENCOUNTER — Encounter (INDEPENDENT_AMBULATORY_CARE_PROVIDER_SITE_OTHER): Payer: Self-pay | Admitting: Internal Medicine

## 2018-02-06 ENCOUNTER — Ambulatory Visit (INDEPENDENT_AMBULATORY_CARE_PROVIDER_SITE_OTHER): Payer: PPO | Admitting: Internal Medicine

## 2018-02-06 VITALS — BP 130/70 | HR 76 | Temp 98.6°F | Resp 18 | Ht 67.0 in | Wt 195.1 lb

## 2018-02-06 DIAGNOSIS — R7989 Other specified abnormal findings of blood chemistry: Secondary | ICD-10-CM

## 2018-02-06 DIAGNOSIS — K760 Fatty (change of) liver, not elsewhere classified: Secondary | ICD-10-CM | POA: Diagnosis not present

## 2018-02-06 DIAGNOSIS — R945 Abnormal results of liver function studies: Secondary | ICD-10-CM

## 2018-02-06 NOTE — Progress Notes (Signed)
Presenting complaint;  Follow-up for elevated transaminases.  Subjective:  Patient is 84 year old Caucasian female who has history of elevated transaminases for about 2 years felt to be due to fatty liver.  She had blood work last week as planned.  She is accompanied by her daughter [name]. She denies abdominal pain or vomiting.  She has sporadic nausea.  She states her appetite is fair.  Her bowels move daily as long as she takes MiraLAX.  She denies melena or rectal bleeding.  She takes Tylenol occasionally.  Family history is negative for liver disease. She is trying to lose weight and trying to be more active but she says she cannot because of shortness of breath.  She has lost 4 pounds since her last visit. She also brings a copy of blood work from 09/21/2017.    Current Medications: Outpatient Encounter Medications as of 02/06/2018  Medication Sig  . ALPRAZolam (XANAX) 1 MG tablet Take 0.5-1 mg by mouth 2 (two) times daily. 0.5 tablet in the morning and 1 tablet at bedtime  . amLODipine (NORVASC) 10 MG tablet Take 10 mg by mouth daily.  Marland Kitchen aspirin 81 MG tablet Take 81 mg by mouth daily.  . Calcium Carb-Cholecalciferol (CALCIUM 600+D3 PO) Take 600 Units by mouth daily. Includes Zinc also.  . chlorthalidone (HYGROTON) 25 MG tablet Take 25 mg by mouth daily.   . metoprolol 75 MG TABS Take 50 mg by mouth 2 (two) times daily. (Patient taking differently: Take 25 mg by mouth daily. )  . pantoprazole (PROTONIX) 40 MG tablet Take 1 tablet (40 mg total) by mouth daily before breakfast.  . polyethylene glycol (MIRALAX / GLYCOLAX) packet Take 17 g by mouth daily.   No facility-administered encounter medications on file as of 02/06/2018.      Objective: Blood pressure 130/70, pulse 76, temperature 98.6 F (37 C), temperature source Oral, resp. rate 18, height 5\' 7"  (1.702 m), weight 195 lb 1.6 oz (88.5 kg). Patient is alert and in no acute distress. Conjunctiva is pink. Sclera is  nonicteric Oropharyngeal mucosa is normal. No neck masses or thyromegaly noted. Cardiac exam with regular rhythm normal S1 and S2. No murmur or gallop noted. Lungs are clear to auscultation. Abdomenis full but soft and nontender with organomegaly or masses. No LE edema or clubbing noted.  Labs/studies Results:  AST and ALT were 129 and 94 respectively on 09/21/2017.  LFTs from 02/02/2018 Bilirubin 0.5, AP 70, AST 111, ALT 86, total protein 6.9 and albumin 4.2. Hepatitis B surface antigen negative.   Assessment:  #1.  Elevated transaminases.  Her transaminases were highest back in fall 2017.  Since they have been fluctuating mildly.  Etiology felt to be fatty liver.  Biochemical markers for hepatitis B C primary biliary cholangitis etc. were all negative.  Positive ANA felt to be nonspecific given normal sed rate negative smooth muscle antibody and normal IgG.  #2.  Exertional dyspnea.  This is her persistent complaint.  Her dyspnea is interfering with the physical activity.  She needs to follow-up with Dr. Woody Seller.   Plan:  Patient advised to follow-up with Dr. Woody Seller regarding exertional dyspnea which appears to be having significant effect on quality of her life. Increase physical activity as tolerated. LFTs in 3 months.  Office visit in Office visit in 6 months.

## 2018-02-06 NOTE — Patient Instructions (Addendum)
LFTs to be repeated in 3 months.  Will do earlier if you lose 5 pounds.

## 2018-02-14 DIAGNOSIS — Z6833 Body mass index (BMI) 33.0-33.9, adult: Secondary | ICD-10-CM | POA: Diagnosis not present

## 2018-02-14 DIAGNOSIS — Z299 Encounter for prophylactic measures, unspecified: Secondary | ICD-10-CM | POA: Diagnosis not present

## 2018-02-14 DIAGNOSIS — F321 Major depressive disorder, single episode, moderate: Secondary | ICD-10-CM | POA: Diagnosis not present

## 2018-02-14 DIAGNOSIS — E7800 Pure hypercholesterolemia, unspecified: Secondary | ICD-10-CM | POA: Diagnosis not present

## 2018-02-14 DIAGNOSIS — E039 Hypothyroidism, unspecified: Secondary | ICD-10-CM | POA: Diagnosis not present

## 2018-02-14 DIAGNOSIS — I10 Essential (primary) hypertension: Secondary | ICD-10-CM | POA: Diagnosis not present

## 2018-02-14 DIAGNOSIS — F419 Anxiety disorder, unspecified: Secondary | ICD-10-CM | POA: Diagnosis not present

## 2018-03-01 DIAGNOSIS — I10 Essential (primary) hypertension: Secondary | ICD-10-CM | POA: Diagnosis not present

## 2018-03-01 DIAGNOSIS — M159 Polyosteoarthritis, unspecified: Secondary | ICD-10-CM | POA: Diagnosis not present

## 2018-03-06 DIAGNOSIS — H04123 Dry eye syndrome of bilateral lacrimal glands: Secondary | ICD-10-CM | POA: Diagnosis not present

## 2018-03-06 DIAGNOSIS — H40013 Open angle with borderline findings, low risk, bilateral: Secondary | ICD-10-CM | POA: Diagnosis not present

## 2018-03-30 DIAGNOSIS — I10 Essential (primary) hypertension: Secondary | ICD-10-CM | POA: Diagnosis not present

## 2018-03-30 DIAGNOSIS — M159 Polyosteoarthritis, unspecified: Secondary | ICD-10-CM | POA: Diagnosis not present

## 2018-04-14 DIAGNOSIS — R21 Rash and other nonspecific skin eruption: Secondary | ICD-10-CM | POA: Diagnosis not present

## 2018-04-14 DIAGNOSIS — S80862A Insect bite (nonvenomous), left lower leg, initial encounter: Secondary | ICD-10-CM | POA: Diagnosis not present

## 2018-04-14 DIAGNOSIS — L299 Pruritus, unspecified: Secondary | ICD-10-CM | POA: Diagnosis not present

## 2018-04-16 ENCOUNTER — Encounter (INDEPENDENT_AMBULATORY_CARE_PROVIDER_SITE_OTHER): Payer: Self-pay | Admitting: *Deleted

## 2018-04-16 ENCOUNTER — Other Ambulatory Visit (INDEPENDENT_AMBULATORY_CARE_PROVIDER_SITE_OTHER): Payer: Self-pay | Admitting: *Deleted

## 2018-04-16 DIAGNOSIS — R945 Abnormal results of liver function studies: Secondary | ICD-10-CM

## 2018-04-16 DIAGNOSIS — R7989 Other specified abnormal findings of blood chemistry: Secondary | ICD-10-CM

## 2018-05-08 DIAGNOSIS — R945 Abnormal results of liver function studies: Secondary | ICD-10-CM | POA: Diagnosis not present

## 2018-05-08 LAB — HEPATIC FUNCTION PANEL
AG RATIO: 1.5 (calc) (ref 1.0–2.5)
ALKALINE PHOSPHATASE (APISO): 67 U/L (ref 33–130)
ALT: 122 U/L — AB (ref 6–29)
AST: 161 U/L — AB (ref 10–35)
Albumin: 4.1 g/dL (ref 3.6–5.1)
Bilirubin, Direct: 0.1 mg/dL (ref 0.0–0.2)
Globulin: 2.8 g/dL (calc) (ref 1.9–3.7)
Indirect Bilirubin: 0.3 mg/dL (calc) (ref 0.2–1.2)
TOTAL PROTEIN: 6.9 g/dL (ref 6.1–8.1)
Total Bilirubin: 0.4 mg/dL (ref 0.2–1.2)

## 2018-05-16 ENCOUNTER — Other Ambulatory Visit (INDEPENDENT_AMBULATORY_CARE_PROVIDER_SITE_OTHER): Payer: Self-pay | Admitting: *Deleted

## 2018-05-16 DIAGNOSIS — R1013 Epigastric pain: Secondary | ICD-10-CM

## 2018-05-16 DIAGNOSIS — Z6831 Body mass index (BMI) 31.0-31.9, adult: Secondary | ICD-10-CM | POA: Diagnosis not present

## 2018-05-16 DIAGNOSIS — I10 Essential (primary) hypertension: Secondary | ICD-10-CM | POA: Diagnosis not present

## 2018-05-16 DIAGNOSIS — Z299 Encounter for prophylactic measures, unspecified: Secondary | ICD-10-CM | POA: Diagnosis not present

## 2018-05-16 DIAGNOSIS — F419 Anxiety disorder, unspecified: Secondary | ICD-10-CM | POA: Diagnosis not present

## 2018-05-16 DIAGNOSIS — F321 Major depressive disorder, single episode, moderate: Secondary | ICD-10-CM | POA: Diagnosis not present

## 2018-05-16 DIAGNOSIS — K5900 Constipation, unspecified: Secondary | ICD-10-CM | POA: Diagnosis not present

## 2018-05-17 DIAGNOSIS — I10 Essential (primary) hypertension: Secondary | ICD-10-CM | POA: Diagnosis not present

## 2018-05-17 DIAGNOSIS — M159 Polyosteoarthritis, unspecified: Secondary | ICD-10-CM | POA: Diagnosis not present

## 2018-05-30 ENCOUNTER — Ambulatory Visit (HOSPITAL_COMMUNITY)
Admission: RE | Admit: 2018-05-30 | Discharge: 2018-05-30 | Disposition: A | Discharge status: Home / Self Care | Payer: PPO | Source: Ambulatory Visit | Attending: Internal Medicine | Admitting: Internal Medicine

## 2018-05-30 DIAGNOSIS — R1013 Epigastric pain: Secondary | ICD-10-CM | POA: Insufficient documentation

## 2018-05-30 DIAGNOSIS — K760 Fatty (change of) liver, not elsewhere classified: Secondary | ICD-10-CM | POA: Diagnosis not present

## 2018-05-30 LAB — POCT I-STAT CREATININE: Creatinine, Ser: 0.7 mg/dL (ref 0.44–1.00)

## 2018-05-30 MED ORDER — IOPAMIDOL (ISOVUE-300) INJECTION 61%
100.0000 mL | Freq: Once | INTRAVENOUS | Status: AC | PRN
  Administered 2018-05-30: 100 mL via INTRAVENOUS

## 2018-06-01 DIAGNOSIS — L570 Actinic keratosis: Secondary | ICD-10-CM | POA: Diagnosis not present

## 2018-06-01 DIAGNOSIS — Z6831 Body mass index (BMI) 31.0-31.9, adult: Secondary | ICD-10-CM | POA: Diagnosis not present

## 2018-06-01 DIAGNOSIS — I10 Essential (primary) hypertension: Secondary | ICD-10-CM | POA: Diagnosis not present

## 2018-06-01 DIAGNOSIS — L821 Other seborrheic keratosis: Secondary | ICD-10-CM | POA: Diagnosis not present

## 2018-06-01 DIAGNOSIS — Z299 Encounter for prophylactic measures, unspecified: Secondary | ICD-10-CM | POA: Diagnosis not present

## 2018-06-02 ENCOUNTER — Other Ambulatory Visit (INDEPENDENT_AMBULATORY_CARE_PROVIDER_SITE_OTHER): Payer: Self-pay | Admitting: Internal Medicine

## 2018-06-02 MED ORDER — PREDNISONE 10 MG PO TABS: 10.0000 mg | ORAL_TABLET | Freq: Every day | ORAL | 1 refills | Status: DC

## 2018-06-04 ENCOUNTER — Other Ambulatory Visit (INDEPENDENT_AMBULATORY_CARE_PROVIDER_SITE_OTHER): Payer: Self-pay | Admitting: *Deleted

## 2018-06-04 ENCOUNTER — Encounter (INDEPENDENT_AMBULATORY_CARE_PROVIDER_SITE_OTHER): Payer: Self-pay | Admitting: *Deleted

## 2018-06-04 DIAGNOSIS — R1013 Epigastric pain: Principal | ICD-10-CM

## 2018-06-04 DIAGNOSIS — G8929 Other chronic pain: Secondary | ICD-10-CM

## 2018-06-04 DIAGNOSIS — K760 Fatty (change of) liver, not elsewhere classified: Secondary | ICD-10-CM

## 2018-06-05 ENCOUNTER — Ambulatory Visit (INDEPENDENT_AMBULATORY_CARE_PROVIDER_SITE_OTHER): Payer: PPO | Admitting: Internal Medicine

## 2018-06-08 ENCOUNTER — Encounter (HOSPITAL_COMMUNITY): Payer: Self-pay

## 2018-06-08 ENCOUNTER — Observation Stay (HOSPITAL_COMMUNITY)
Admission: EM | Admit: 2018-06-08 | Discharge: 2018-06-09 | Disposition: A | Discharge status: Home / Self Care | Payer: PPO | Attending: Family Medicine | Admitting: Family Medicine

## 2018-06-08 ENCOUNTER — Emergency Department (HOSPITAL_COMMUNITY): Payer: PPO

## 2018-06-08 DIAGNOSIS — Z79899 Other long term (current) drug therapy: Secondary | ICD-10-CM | POA: Insufficient documentation

## 2018-06-08 DIAGNOSIS — K760 Fatty (change of) liver, not elsewhere classified: Secondary | ICD-10-CM | POA: Diagnosis not present

## 2018-06-08 DIAGNOSIS — R079 Chest pain, unspecified: Secondary | ICD-10-CM

## 2018-06-08 DIAGNOSIS — R0902 Hypoxemia: Secondary | ICD-10-CM | POA: Diagnosis not present

## 2018-06-08 DIAGNOSIS — R0789 Other chest pain: Principal | ICD-10-CM | POA: Insufficient documentation

## 2018-06-08 DIAGNOSIS — R0602 Shortness of breath: Secondary | ICD-10-CM | POA: Diagnosis not present

## 2018-06-08 DIAGNOSIS — K219 Gastro-esophageal reflux disease without esophagitis: Secondary | ICD-10-CM | POA: Diagnosis not present

## 2018-06-08 DIAGNOSIS — R0689 Other abnormalities of breathing: Secondary | ICD-10-CM | POA: Diagnosis not present

## 2018-06-08 DIAGNOSIS — R000 Tachycardia, unspecified: Secondary | ICD-10-CM | POA: Diagnosis not present

## 2018-06-08 DIAGNOSIS — I10 Essential (primary) hypertension: Secondary | ICD-10-CM | POA: Insufficient documentation

## 2018-06-08 DIAGNOSIS — I959 Hypotension, unspecified: Secondary | ICD-10-CM | POA: Diagnosis not present

## 2018-06-08 DIAGNOSIS — F419 Anxiety disorder, unspecified: Secondary | ICD-10-CM | POA: Insufficient documentation

## 2018-06-08 MED ORDER — ASPIRIN 81 MG PO CHEW
324.0000 mg | CHEWABLE_TABLET | Freq: Once | ORAL | Status: AC
  Administered 2018-06-08: 324 mg via ORAL
  Filled 2018-06-08: qty 4

## 2018-06-08 NOTE — ED Triage Notes (Signed)
Pt called ems for chest pain that started approx 10 pm.  Chest pain in left anterior chest with sob, no other complaints.  Pt denies pain at this time, states bp has been running high.

## 2018-06-08 NOTE — ED Provider Notes (Signed)
[facility] EMERGENCY DEPARTMENT Provider Note   CSN: [id number] Arrival date & time: 06/08/18  2300     History   Chief Complaint Chief Complaint  Patient presents with  . Chest Pain    HPI Heather [name] is a 84 y.o. female.  Patient from home with episode of chest pain onset while she was talking to her daughter on the phone about 10 PM.  States she felt well before this.  She believes that her blood pressure was elevated because she was "shaking all over" then she developed left-sided chest pain that lasted for about 10 minutes and is since resolved.  This is associated with shortness of breath and nausea.  Denies any radiation of the pain.  She now complains of soreness to her bilateral arms.  No vomiting or diaphoresis.  Denies any history of coronary disease.  Patient does have a history of acid reflux and "fatty liver for which she sees gastroenterology.  She had a CT scan last week for this.  She has some chronic dysphagia and acid reflux but states this pain is different.   The history is provided by the patient.  Chest Pain   Associated symptoms include shortness of breath. Pertinent negatives include no abdominal pain, no dizziness, no fever, no headaches, no nausea, no vomiting and no weakness.    Past Medical History:  Diagnosis Date  . Anxiety   . GERD (gastroesophageal reflux disease)   . Hx of shoulder surgery    Left Shoulder - pitched nerve.  . Other and unspecified hyperlipidemia   . Unspecified essential hypertension     Patient Active Problem List   Diagnosis Date Noted  . Palpitations 04/22/2012  . Chest pain 04/22/2012  . Coronary artery disease excluded 04/22/2012  . Insomnia 04/22/2012    Past Surgical History:  Procedure Laterality Date  . CERVICAL DISC SURGERY    . CESAREAN SECTION  1970  . History of shoulder surgery Left    pitched nerve.     OB History   None      Home Medications    Prior to Admission medications     Medication Sig Start Date End Date Taking? Authorizing Provider  ALPRAZolam Duanne Moron) 1 MG tablet Take 0.5-1 mg by mouth 2 (two) times daily. 0.5 tablet in the morning and 1 tablet at bedtime 03/16/12   [provider]  amLODipine (NORVASC) 10 MG tablet Take 10 mg by mouth daily. 07/14/17   [provider]  aspirin 81 MG tablet Take 81 mg by mouth daily.    [provider]  Calcium Carb-Cholecalciferol (CALCIUM 600+D3 PO) Take 600 Units by mouth daily. Includes Zinc also.    [provider]  chlorthalidone (HYGROTON) 25 MG tablet Take 25 mg by mouth daily.  11/23/16   [provider]  metoprolol 75 MG TABS Take 50 mg by mouth 2 (two) times daily. Patient taking differently: Take 25 mg by mouth daily.  12/26/15   Carmin Muskrat, MD  pantoprazole (PROTONIX) 40 MG tablet Take 1 tablet (40 mg total) by mouth daily before breakfast. 12/05/17   Rehman, Mechele Dawley, MD  polyethylene glycol (MIRALAX / GLYCOLAX) packet Take 17 g by mouth daily.    [provider]  predniSONE (DELTASONE) 10 MG tablet Take 1 tablet (10 mg total) by mouth daily with breakfast. 06/02/18   Rehman, Mechele Dawley, MD    Family History Family History  Problem Relation Age of Onset  . Hypertension Unknown   .  Heart attack Sister     Social History Social History   Tobacco Use  . Smoking status: Never Smoker  . Smokeless tobacco: Never Used  Substance Use Topics  . Alcohol use: No    Alcohol/week: 0.0 standard drinks  . Drug use: No     Allergies   Patient has no known allergies.   Review of Systems Review of Systems  Constitutional: Negative for activity change, appetite change and fever.  HENT: Negative for congestion and rhinorrhea.   Eyes: Negative for visual disturbance.  Respiratory: Positive for chest tightness and shortness of breath.   Cardiovascular: Positive for chest pain.  Gastrointestinal: Negative for abdominal pain, nausea and vomiting.   Genitourinary: Negative for dysuria, hematuria, vaginal bleeding and vaginal discharge.  Musculoskeletal: Negative for arthralgias and myalgias.  Neurological: Negative for dizziness, weakness and headaches.    all other systems are negative except as noted in the HPI and PMH.   Physical Exam Updated Vital Signs BP (!) 195/65 (BP Location: Right Arm)   Pulse 94   Temp 98.1 F (36.7 C) (Oral)   Resp 18   Ht 5\' 7"  (1.702 m)   Wt 86.2 kg   SpO2 96%   BMI 29.76 kg/m   Physical Exam  Constitutional: She is oriented to person, place, and time. She appears well-developed and well-nourished. No distress.  HENT:  Head: Normocephalic and atraumatic.  Mouth/Throat: Oropharynx is clear and moist. No oropharyngeal exudate.  Eyes: Pupils are equal, round, and reactive to light. Conjunctivae and EOM are normal.  Neck: Normal range of motion. Neck supple.  No meningismus.  Cardiovascular: Normal rate, regular rhythm, normal heart sounds and intact distal pulses.  No murmur heard. Pulmonary/Chest: Effort normal and breath sounds normal. No respiratory distress. She exhibits no tenderness.  Abdominal: Soft. There is no tenderness. There is no rebound and no guarding.  Musculoskeletal: Normal range of motion. She exhibits no edema or tenderness.  Neurological: She is alert and oriented to person, place, and time. No cranial nerve deficit. She exhibits normal muscle tone. Coordination normal.   5/5 strength throughout. CN 2-12 intact.Equal grip strength.   Skin: Skin is warm. Capillary refill takes less than 2 seconds. No rash noted.  Psychiatric: She has a normal mood and affect. Her behavior is normal.  Nursing note and vitals reviewed.    ED Treatments / Results  Labs (all labs ordered are listed, but only abnormal results are displayed) Labs Reviewed  CBC WITH DIFFERENTIAL/PLATELET - Abnormal; Notable for the following components:      Result Value   Hemoglobin 11.8 (*)    MCV 77.3  (*)    MCH 24.4 (*)    RDW 16.6 (*)    All other components within normal limits  COMPREHENSIVE METABOLIC PANEL - Abnormal; Notable for the following components:   Potassium 3.2 (*)    Glucose, Bld 160 (*)    AST 127 (*)    ALT 100 (*)    All other components within normal limits  LIPASE, BLOOD  TROPONIN I  TROPONIN I  TROPONIN I  TROPONIN I  MAGNESIUM    EKG EKG Interpretation  Date/Time:  Friday June 08 2018 23:14:17 EDT Ventricular Rate:  85 PR Interval:    QRS Duration: 110 QT Interval:  363 QTC Calculation: 432 R Axis:   -48 Text Interpretation:  Sinus rhythm Atrial premature complex Incomplete left bundle branch block Left ventricular hypertrophy No significant change was found Confirmed by Ezequiel Essex [phone])  on 06/08/2018 11:20:17 PM   Radiology Dg Chest 2 View  Result Date: 06/09/2018 CLINICAL DATA:  Left anterior chest pain with shortness of breath starting at 10 p.m. EXAM: CHEST - 2 VIEW COMPARISON:  01/21/2018 FINDINGS: Shallow inspiration. Linear fibrosis or atelectasis in the mid lungs. Heart size and pulmonary vascularity are normal for technique. No consolidation or airspace infiltration. No blunting of costophrenic angles. No pneumothorax. Calcification of the aorta. Previous kyphoplasty change in the T12 vertebra. IMPRESSION: Shallow inspiration with linear fibrosis or atelectasis in the mid lungs. No focal consolidation. Aortic atherosclerosis. Electronically Signed   By: Lucienne Capers M.D.   On: 06/09/2018 00:28    Procedures Procedures (including critical care time)  Medications Ordered in ED Medications  aspirin chewable tablet 324 mg (324 mg Oral Given 06/08/18 2336)     Initial Impression / Assessment and Plan / ED Course  I have reviewed the triage vital signs and the nursing notes.  Pertinent labs & imaging results that were available during my care of the patient were reviewed by me and considered in my medical decision making (see  chart for details).    Episode of chest pain or shortness of breath at onset at rest, now resolved.  EKG with sinus rhythm and LVH.  Aspirin given.  Patient is hypertensive.  Low suspicion for pulmonary embolism or aortic dissection.  Last stress test in 2015 was negative.  Troponin negative.  Chest x-ray is reassuring.  Heart score is 4.  Patient reports her chest pain is coming back after she comes back from x-ray associated with some shortness of breath.  She is given a nitroglycerin. LFTs with similar elevation. Lipase normal.  Her current chest pain has resolved after nitroglycerin.  Repeat EKG unchanged.  Plan admission for chest pain rule out.  Consider also GERD in setting of recent steroid use due to elevated LFTs.  Heart score is 4.  Admission discussed with Dr. Manuella Ghazi. Final Clinical Impressions(s) / ED Diagnoses   Final diagnoses:  Nonspecific chest pain    ED Discharge Orders    None       Leniya Breit, Annie Main, MD 06/09/18 [phone]

## 2018-06-09 ENCOUNTER — Other Ambulatory Visit: Payer: Self-pay

## 2018-06-09 ENCOUNTER — Encounter (HOSPITAL_COMMUNITY): Payer: Self-pay | Admitting: *Deleted

## 2018-06-09 ENCOUNTER — Other Ambulatory Visit (HOSPITAL_COMMUNITY): Payer: PPO

## 2018-06-09 DIAGNOSIS — F419 Anxiety disorder, unspecified: Secondary | ICD-10-CM

## 2018-06-09 DIAGNOSIS — K760 Fatty (change of) liver, not elsewhere classified: Secondary | ICD-10-CM

## 2018-06-09 DIAGNOSIS — K219 Gastro-esophageal reflux disease without esophagitis: Secondary | ICD-10-CM

## 2018-06-09 DIAGNOSIS — R079 Chest pain, unspecified: Secondary | ICD-10-CM | POA: Diagnosis not present

## 2018-06-09 DIAGNOSIS — R0602 Shortness of breath: Secondary | ICD-10-CM | POA: Diagnosis not present

## 2018-06-09 DIAGNOSIS — I10 Essential (primary) hypertension: Secondary | ICD-10-CM

## 2018-06-09 LAB — COMPREHENSIVE METABOLIC PANEL
ALT: 100 U/L — ABNORMAL HIGH (ref 0–44)
AST: 127 U/L — ABNORMAL HIGH (ref 15–41)
Albumin: 3.8 g/dL (ref 3.5–5.0)
Alkaline Phosphatase: 68 U/L (ref 38–126)
Anion gap: 10 (ref 5–15)
BUN: 18 mg/dL (ref 8–23)
CHLORIDE: 103 mmol/L (ref 98–111)
CO2: 24 mmol/L (ref 22–32)
Calcium: 9.1 mg/dL (ref 8.9–10.3)
Creatinine, Ser: 0.67 mg/dL (ref 0.44–1.00)
Glucose, Bld: 160 mg/dL — ABNORMAL HIGH (ref 70–99)
POTASSIUM: 3.2 mmol/L — AB (ref 3.5–5.1)
Sodium: 137 mmol/L (ref 135–145)
Total Bilirubin: 0.6 mg/dL (ref 0.3–1.2)
Total Protein: 7 g/dL (ref 6.5–8.1)

## 2018-06-09 LAB — TROPONIN I
Troponin I: <0.03 ng/mL (ref <0.03)
Troponin I: <0.03 ng/mL (ref <0.03)

## 2018-06-09 LAB — CBC WITH DIFFERENTIAL/PLATELET
BASOS PCT: 1 %
Basophils Absolute: 0 10*3/uL (ref 0.0–0.1)
EOS ABS: 0.3 10*3/uL (ref 0.0–0.7)
EOS PCT: 4 %
HCT: 37.4 % (ref 36.0–46.0)
HEMOGLOBIN: 11.8 g/dL — AB (ref 12.0–15.0)
LYMPHS ABS: 3 10*3/uL (ref 0.7–4.0)
Lymphocytes Relative: 41 %
MCH: 24.4 pg — AB (ref 26.0–34.0)
MCHC: 31.6 g/dL (ref 30.0–36.0)
MCV: 77.3 fL — ABNORMAL LOW (ref 78.0–100.0)
MONO ABS: 0.7 10*3/uL (ref 0.1–1.0)
MONOS PCT: 10 %
Neutro Abs: 3.2 10*3/uL (ref 1.7–7.7)
Neutrophils Relative %: 44 %
PLATELETS: 236 10*3/uL (ref 150–400)
RBC: 4.84 MIL/uL (ref 3.87–5.11)
RDW: 16.6 % — ABNORMAL HIGH (ref 11.5–15.5)
WBC: 7.2 10*3/uL (ref 4.0–10.5)

## 2018-06-09 LAB — MAGNESIUM: Magnesium: 2 mg/dL (ref 1.7–2.4)

## 2018-06-09 LAB — LIPASE, BLOOD: LIPASE: 37 U/L (ref 11–51)

## 2018-06-09 MED ORDER — SENNOSIDES-DOCUSATE SODIUM 8.6-50 MG PO TABS
1.0000 | ORAL_TABLET | Freq: Two times a day (BID) | ORAL | Status: DC
  Administered 2018-06-09: 1 via ORAL
  Filled 2018-06-09: qty 1

## 2018-06-09 MED ORDER — CALCIUM-VITAMIN D 500-200 MG-UNIT PO TABS
ORAL_TABLET | Freq: Every day | ORAL | Status: DC
  Administered 2018-06-09: 1 via ORAL
  Filled 2018-06-09 (×3): qty 1

## 2018-06-09 MED ORDER — ENOXAPARIN SODIUM 40 MG/0.4ML ~~LOC~~ SOLN
40.0000 mg | SUBCUTANEOUS | Status: DC
  Administered 2018-06-09: 40 mg via SUBCUTANEOUS
  Filled 2018-06-09: qty 0.4

## 2018-06-09 MED ORDER — POTASSIUM CHLORIDE CRYS ER 20 MEQ PO TBCR
40.0000 meq | EXTENDED_RELEASE_TABLET | Freq: Once | ORAL | Status: AC
  Administered 2018-06-09: 40 meq via ORAL
  Filled 2018-06-09: qty 2

## 2018-06-09 MED ORDER — SODIUM CHLORIDE 0.9 % IV SOLN: 250.0000 mL | INTRAVENOUS | Status: DC | PRN

## 2018-06-09 MED ORDER — ACETAMINOPHEN 325 MG PO TABS
650.0000 mg | ORAL_TABLET | Freq: Four times a day (QID) | ORAL | Status: DC | PRN
Start: 2018-06-09 — End: 2018-06-09

## 2018-06-09 MED ORDER — BISACODYL 10 MG RE SUPP
10.0000 mg | Freq: Once | RECTAL | Status: AC
  Administered 2018-06-09: 10 mg via RECTAL
  Filled 2018-06-09: qty 1

## 2018-06-09 MED ORDER — METOPROLOL TARTRATE 25 MG PO TABS
25.0000 mg | ORAL_TABLET | Freq: Every day | ORAL | Status: DC
  Administered 2018-06-09: 25 mg via ORAL
  Filled 2018-06-09: qty 1

## 2018-06-09 MED ORDER — MECLIZINE HCL 12.5 MG PO TABS
25.0000 mg | ORAL_TABLET | Freq: Three times a day (TID) | ORAL | Status: DC | PRN
  Administered 2018-06-09: 25 mg via ORAL
  Filled 2018-06-09: qty 2

## 2018-06-09 MED ORDER — ALPRAZOLAM 0.5 MG PO TABS
0.5000 mg | ORAL_TABLET | Freq: Every morning | ORAL | Status: DC
  Administered 2018-06-09: 0.5 mg via ORAL
  Filled 2018-06-09: qty 1

## 2018-06-09 MED ORDER — HYDRALAZINE HCL 20 MG/ML IJ SOLN: 10.0000 mg | INTRAMUSCULAR | Status: DC | PRN

## 2018-06-09 MED ORDER — NITROGLYCERIN 0.4 MG SL SUBL: 0.4000 mg | SUBLINGUAL_TABLET | SUBLINGUAL | Status: DC | PRN

## 2018-06-09 MED ORDER — SODIUM CHLORIDE 0.9% FLUSH
3.0000 mL | Freq: Two times a day (BID) | INTRAVENOUS | Status: DC
  Administered 2018-06-09: 3 mL via INTRAVENOUS

## 2018-06-09 MED ORDER — ONDANSETRON HCL 4 MG/2ML IJ SOLN: 4.0000 mg | Freq: Four times a day (QID) | INTRAMUSCULAR | Status: DC | PRN

## 2018-06-09 MED ORDER — CLONIDINE HCL 0.1 MG PO TABS
0.1000 mg | ORAL_TABLET | Freq: Two times a day (BID) | ORAL | Status: DC
Start: 2018-06-09 — End: 2018-06-09
  Administered 2018-06-09: 0.1 mg via ORAL
  Filled 2018-06-09: qty 1

## 2018-06-09 MED ORDER — POLYETHYLENE GLYCOL 3350 17 G PO PACK
17.0000 g | PACK | Freq: Every day | ORAL | Status: DC
  Administered 2018-06-09: 17 g via ORAL
  Filled 2018-06-09: qty 1

## 2018-06-09 MED ORDER — ACETAMINOPHEN 650 MG RE SUPP: 650.0000 mg | Freq: Four times a day (QID) | RECTAL | Status: DC | PRN

## 2018-06-09 MED ORDER — ASPIRIN EC 81 MG PO TBEC
81.0000 mg | DELAYED_RELEASE_TABLET | Freq: Every day | ORAL | Status: DC
  Administered 2018-06-09: 81 mg via ORAL
  Filled 2018-06-09: qty 1

## 2018-06-09 MED ORDER — METOPROLOL TARTRATE 50 MG PO TABS: 50.0000 mg | ORAL_TABLET | Freq: Two times a day (BID) | ORAL | Status: DC

## 2018-06-09 MED ORDER — SODIUM CHLORIDE 0.9% FLUSH: 3.0000 mL | INTRAVENOUS | Status: DC | PRN

## 2018-06-09 MED ORDER — ALPRAZOLAM 0.5 MG PO TABS: 0.5000 mg | ORAL_TABLET | Freq: Two times a day (BID) | ORAL | Status: DC

## 2018-06-09 MED ORDER — AMLODIPINE BESYLATE 5 MG PO TABS
10.0000 mg | ORAL_TABLET | Freq: Every day | ORAL | Status: DC
  Administered 2018-06-09: 10 mg via ORAL
  Filled 2018-06-09: qty 2

## 2018-06-09 MED ORDER — PANTOPRAZOLE SODIUM 40 MG PO TBEC
40.0000 mg | DELAYED_RELEASE_TABLET | Freq: Every day | ORAL | Status: DC
  Administered 2018-06-09: 40 mg via ORAL
  Filled 2018-06-09: qty 1

## 2018-06-09 MED ORDER — ONDANSETRON HCL 4 MG PO TABS
4.0000 mg | ORAL_TABLET | Freq: Four times a day (QID) | ORAL | Status: DC | PRN
Start: 2018-06-09 — End: 2018-06-09

## 2018-06-09 MED ORDER — ALPRAZOLAM 1 MG PO TABS
1.0000 mg | ORAL_TABLET | Freq: Every day | ORAL | Status: DC
  Filled 2018-06-09: qty 1

## 2018-06-09 MED ORDER — CHLORTHALIDONE 25 MG PO TABS
25.0000 mg | ORAL_TABLET | Freq: Every day | ORAL | Status: DC
  Administered 2018-06-09: 25 mg via ORAL
  Filled 2018-06-09: qty 1

## 2018-06-09 MED ORDER — NITROGLYCERIN 0.4 MG SL SUBL
0.4000 mg | SUBLINGUAL_TABLET | Freq: Once | SUBLINGUAL | Status: AC
  Administered 2018-06-09: 0.4 mg via SUBLINGUAL
  Filled 2018-06-09: qty 1

## 2018-06-09 MED ORDER — CLONIDINE HCL 0.1 MG PO TABS: 0.1000 mg | ORAL_TABLET | Freq: Two times a day (BID) | ORAL | 11 refills | Status: DC

## 2018-06-09 MED ORDER — METOPROLOL TARTRATE 25 MG PO TABS: 25.0000 mg | ORAL_TABLET | Freq: Two times a day (BID) | ORAL | 0 refills | Status: DC

## 2018-06-09 NOTE — Plan of Care (Signed)
  Problem: Acute Rehab PT Goals(only PT should resolve) Goal: Patient Will Transfer Sit To/From Stand Flowsheets (Taken 06/09/2018 1022) Patient will transfer sit to/from stand: with modified independence Goal: Pt Will Transfer Bed To Chair/Chair To Bed Flowsheets (Taken 06/09/2018 1022) Pt will Transfer Bed to Chair/Chair to Bed: with modified independence Goal: Pt Will Ambulate Flowsheets (Taken 06/09/2018 1022) Pt will Ambulate: 50 feet; with supervision; with rolling walker    Geraldine Solar PT, DPT

## 2018-06-09 NOTE — Care Management Note (Signed)
Case Management Note  Patient Details  Name: Heather [name] MRN: [id number] Date of Birth: 12/16/1930  Subjective/Objective:                Pt from home alone with daughter's assistance.  Pt to d/c today with [place] [facility].  Pt states she has not used any HH services and has no preference of agency.  Pt agreeable to use [facility].    Action/Plan: Referral called to [facility] with [facility].  Expected Discharge Date:  06/09/18               Expected Discharge Plan:  [name]  In-House Referral:  NA  Discharge planning Services  CM Consult  Post Acute Care Choice:  Home Health Choice offered to:  Patient  DME Arranged:    DME Agency:     HH Arranged:  PT [place]:  [place]  Status of Service:  Completed, signed off  If discussed at [place] of Stay Meetings, dates discussed:    Additional Comments:  Claudie Leach, RN 06/09/2018, 2:21 PM

## 2018-06-09 NOTE — Evaluation (Signed)
Physical Therapy Evaluation Patient Details Name: Shaquia Berkley MRN: [id number] DOB: 03-Dec-1930 Today's Date: 06/09/2018   History of Present Illness   Heather [name] is a 84 y.o. female with medical history significant for GERD, anxiety, hypertension, and fatty liver disease with recent initiation of prednisone due to elevated LFTs who presented to the ED with complaints of some shaking that led to onset of chest pain in the left substernal region while she was on the phone speaking with her daughter.  Her symptoms began at approximately 10 PM and she felt as though her blood pressure was elevated.  She denies any radiation of the pain and had some associated shortness of breath, but no vomiting or diaphoresis.  Patient describes the pain as sharp and stabbing in nature with no particular aggravating or alleviating factors to include deep breath or cough.  The patient does have some chronic dysphasia, but states that the pain is unusual and different.  She did start prednisone this week as directed by her GI physician.  Clinical Impression  Pt received in bed and was agreeable to PT evaluation; pt admitted with above diagonsis. Pt from home alone, mainly [facility] ambulator and uses furniture when inside and her rollator when out in community, independent with ADLs. Pt currently presents with deficits in functional mobility due to deficits in strength, endurance, and cardiovascular endurance. Pt also c/o dizziness for 1 month. Upon vestibular assessment, PT feels pt's dizziness is BP related and not vestibular as she states her dizziness is not associated with a specific position or change in position. She also did not demo any nystagmus with smooth pursuits and her saccades were WNL. PT recommending HHPT once medically ready for d/c to maximize pt's strength, balance, and gait in order to promote return to PLOF. Will follow acutely.    Follow Up Recommendations Home health PT    Equipment  Recommendations  None recommended by PT    Recommendations for Other Services       Precautions / Restrictions Precautions Precautions: Fall      Mobility  Bed Mobility Overal bed mobility: Independent                Transfers Overall transfer level: Needs assistance   Transfers: Sit to/from Stand Sit to Stand: Supervision            Ambulation/Gait Ambulation/Gait assistance: Supervision Gait Distance (Feet): 15 Feet Assistive device: Rolling walker (2 wheeled) Gait Pattern/deviations: Step-through pattern;Decreased stride length   Gait velocity interpretation: <1.31 ft/sec, indicative of household ambulator General Gait Details: slow, labored cadence; mild unsteadiness but no overt LOB  Stairs            Wheelchair Mobility    Modified Rankin (Stroke Patients Only)       Balance Overall balance assessment: Needs assistance Sitting-balance support: Bilateral upper extremity supported Sitting balance-Leahy Scale: Fair                                       Pertinent Vitals/Pain Pain Assessment: Faces Faces Pain Scale: Hurts little more Pain Location: head, states it's sore Pain Descriptors / Indicators: Sore Pain Intervention(s): Limited activity within patient's tolerance;Monitored during session;Repositioned    Home Living                        Prior Function  Hand Dominance        Extremity/Trunk Assessment   Upper Extremity Assessment Upper Extremity Assessment: Overall WFL for tasks assessed    Lower Extremity Assessment Lower Extremity Assessment: Generalized weakness    Cervical / Trunk Assessment Cervical / Trunk Assessment: Kyphotic  Communication      Cognition Arousal/Alertness: Awake/alert Behavior During Therapy: WFL for tasks assessed/performed Overall Cognitive Status: Within Functional Limits for tasks assessed                                         General Comments      Exercises     Assessment/Plan    PT Assessment Patient needs continued PT services  PT Problem List Decreased strength;Decreased activity tolerance;Decreased balance;Decreased mobility;Cardiopulmonary status limiting activity       PT Treatment Interventions DME instruction;Gait training;Therapeutic activities;Therapeutic exercise;Stair training;Balance training;Patient/family education    PT Goals (Current goals can be found in the Care Plan section)  Acute Rehab PT Goals Patient Stated Goal: go home PT Goal Formulation: With patient Time For Goal Achievement: 06/16/18 Potential to Achieve Goals: Good    Frequency Min 3X/week   Barriers to discharge        Co-evaluation               AM-PAC PT "6 Clicks" Daily Activity  Outcome Measure                  End of Session Equipment Utilized During Treatment: Gait belt Activity Tolerance: Patient tolerated treatment well;Patient limited by fatigue Patient left: in bed;with call bell/phone within reach;with nursing/sitter in room Nurse Communication: Mobility status PT Visit Diagnosis: Unsteadiness on feet (R26.81);Other abnormalities of gait and mobility (R26.89);Muscle weakness (generalized) (M62.81);Difficulty in walking, not elsewhere classified (R26.2)    Time: 3810-1751 PT Time Calculation (min) (ACUTE ONLY): 23 min   Charges:     PT Treatments $Therapeutic Activity: 8-22 mins          Geraldine Solar PT, DPT

## 2018-06-09 NOTE — Discharge Instructions (Signed)
Nonspecific Chest Pain °Chest pain can be caused by many different conditions. There is always a chance that your pain could be related to something serious, such as a heart attack or a blood clot in your lungs. Chest pain can also be caused by conditions that are not life-threatening. If you have chest pain, it is very important to follow up with your health care provider. °What are the causes? °Causes of this condition include: °· Heartburn. °· Pneumonia or bronchitis. °· Anxiety or stress. °· Inflammation around your heart (pericarditis) or lung (pleuritis or pleurisy). °· A blood clot in your lung. °· A collapsed lung (pneumothorax). This can develop suddenly on its own (spontaneous pneumothorax) or from trauma to the chest. °· Shingles infection (varicella-zoster virus). °· Heart attack. °· Damage to the bones, muscles, and cartilage that make up your chest wall. This can include: °? Bruised bones due to injury. °? Strained muscles or cartilage due to frequent or repeated coughing or overwork. °? Fracture to one or more ribs. °? Sore cartilage due to inflammation (costochondritis). ° °What increases the risk? °Risk factors for this condition may include: °· Activities that increase your risk for trauma or injury to your chest. °· Respiratory infections or conditions that cause frequent coughing. °· Medical conditions or overeating that can cause heartburn. °· Heart disease or family history of heart disease. °· Conditions or health behaviors that increase your risk of developing a blood clot. °· Having had chicken pox (varicella zoster). ° °What are the signs or symptoms? °Chest pain can feel like: °· Burning or tingling on the surface of your chest or deep in your chest. °· Crushing, pressure, aching, or squeezing pain. °· Dull or sharp pain that is worse when you move, cough, or take a deep breath. °· Pain that is also felt in your back, neck, shoulder, or arm, or pain that spreads to any of these  areas. ° °Your chest pain may come and go, or it may stay constant. °How is this diagnosed? °Lab tests or other studies may be needed to find the cause of your pain. Your health care provider may have you take a test called an ECG (electrocardiogram). An ECG records your heartbeat patterns at the time the test is performed. You may also have other tests, such as: °· Transthoracic echocardiogram (TTE). In this test, sound waves are used to create a picture of the heart structures and to look at how blood flows through your heart. °· Transesophageal echocardiogram (TEE). This is a more advanced imaging test that takes images from inside your body. It allows your health care provider to see your heart in finer detail. °· Cardiac monitoring. This allows your health care provider to monitor your heart rate and rhythm in real time. °· Holter monitor. This is a portable device that records your heartbeat and can help to diagnose abnormal heartbeats. It allows your health care provider to track your heart activity for several days, if needed. °· Stress tests. These can be done through exercise or by taking medicine that makes your heart beat more quickly. °· Blood tests. °· Other imaging tests. ° °How is this treated? °Treatment depends on what is causing your chest pain. Treatment may include: °· Medicines. These may include: °? Acid blockers for heartburn. °? Anti-inflammatory medicine. °? Pain medicine for inflammatory conditions. °? Antibiotic medicine, if an infection is present. °? Medicines to dissolve blood clots. °? Medicines to treat coronary artery disease (CAD). °· Supportive care for conditions that   do not require medicines. This may include: ? Resting. ? Applying heat or cold packs to injured areas. ? Limiting activities until pain decreases.  Follow these instructions at home: Medicines  If you were prescribed an antibiotic, take it as told by your health care provider. Do not stop taking the  antibiotic even if you start to feel better.  Take over-the-counter and prescription medicines only as told by your health care provider. Lifestyle  Do not use any products that contain nicotine or tobacco, such as cigarettes and e-cigarettes. If you need help quitting, ask your health care provider.  Do not drink alcohol.  Make lifestyle changes as directed by your health care provider. These may include: ? Getting regular exercise. Ask your health care provider to suggest some activities that are safe for you. ? Eating a heart-healthy diet. A registered dietitian can help you to learn healthy eating options. ? Maintaining a healthy weight. ? Managing diabetes, if necessary. ? Reducing stress, such as with yoga or relaxation techniques. General instructions  Avoid any activities that bring on chest pain.  If heartburn is the cause for your chest pain, raise (elevate) the head of your bed about 6 inches (15 cm) by putting blocks under the legs. Sleeping with more pillows does not effectively relieve heartburn because it only changes the position of your head.  Keep all follow-up visits as told by your health care provider. This is important. This includes any further testing if your chest pain does not go away. Contact a health care provider if:  Your chest pain does not go away.  You have a rash with blisters on your chest.  You have a fever.  You have chills. Get help right away if:  Your chest pain is worse.  You have a cough that gets worse, or you cough up blood.  You have severe pain in your abdomen.  You have severe weakness.  You faint.  You have sudden, unexplained chest discomfort.  You have sudden, unexplained discomfort in your arms, back, neck, or jaw.  You have shortness of breath at any time.  You suddenly start to sweat, or your skin gets clammy.  You feel nauseous or you vomit.  You suddenly feel light-headed or dizzy.  Your heart begins to beat  quickly, or it feels like it is skipping beats. These symptoms may represent a serious problem that is an emergency. Do not wait to see if the symptoms will go away. Get medical help right away. Call your local emergency services (911 in the [place] Do not drive yourself to the hospital. This information is not intended to replace advice given to you by your health care provider. Make sure you discuss any questions you have with your health care provider. Document Released: 07/20/2005 Document Revised: 07/04/2016 Document Reviewed: 07/04/2016 Elsevier Interactive Patient Education  2017 [place].   Follow with Primary MD  Glenda Chroman, MD  and other consultant's as instructed your Hospitalist MD  Please get a complete blood count and chemistry panel checked by your Primary MD at your next visit, and again as instructed by your Primary MD.  Get Medicines reviewed and adjusted: Please take all your medications with you for your next visit with your Primary MD  Laboratory/radiological data: Please request your Primary MD to go over all hospital tests and procedure/radiological results at the follow up, please ask your Primary MD to get all Hospital records sent to his/her office.  In some cases, they  will be blood work, cultures and biopsy results pending at the time of your discharge. Please request that your primary care M.D. follows up on these results.  Also Note the following: If you experience worsening of your admission symptoms, develop shortness of breath, life threatening emergency, suicidal or homicidal thoughts you must seek medical attention immediately by calling 911 or calling your MD immediately  if symptoms less severe.  You must read complete instructions/literature along with all the possible adverse reactions/side effects for all the Medicines you take and that have been prescribed to you. Take any new Medicines after you have completely understood and accpet all the  possible adverse reactions/side effects.   Do not drive when taking Pain medications or sleeping medications (Benzodaizepines)  Do not take more than prescribed Pain, Sleep and Anxiety Medications. It is not advisable to combine anxiety,sleep and pain medications without talking with your primary care practitioner  Special Instructions: If you have smoked or chewed Tobacco  in the last 2 yrs please stop smoking, stop any regular Alcohol  and or any Recreational drug use.  Wear Seat belts while driving.  Please note: You were cared for by a hospitalist during your hospital stay. Once you are discharged, your primary care physician will handle any further medical issues. Please note that NO REFILLS for any discharge medications will be authorized once you are discharged, as it is imperative that you return to your primary care physician (or establish a relationship with a primary care physician if you do not have one) for your post hospital discharge needs so that they can reassess your need for medications and monitor your lab values.

## 2018-06-09 NOTE — ED Notes (Signed)
Patient rates her left arm pain and chest pain at a 0 after on sublingual nitro.

## 2018-06-09 NOTE — H&P (Addendum)
History and Physical    Maui Ahart FXT:[id number] DOB: [birth year] DOA: 06/08/2018  PCP: Glenda Chroman, MD   Patient coming from: Home  Chief Complaint: Left-sided chest pain  HPI: Heather [name] is a 84 y.o. female with medical history significant for GERD, anxiety, hypertension, and fatty liver disease with recent initiation of prednisone due to elevated LFTs who presented to the ED with complaints of some shaking that led to onset of chest pain in the left substernal region while she was on the phone speaking with her daughter.  Her symptoms began at approximately 10 PM and she felt as though her blood pressure was elevated.  She denies any radiation of the pain and had some associated shortness of breath, but no vomiting or diaphoresis.  Patient describes the pain as sharp and stabbing in nature with no particular aggravating or alleviating factors to include deep breath or cough.  The patient does have some chronic dysphasia, but states that the pain is unusual and different.  She did start prednisone this week as directed by her GI physician.   ED Course: All vital signs are stable, but she was noted to have some elevated blood pressure readings upon initial arrival.  Laboratory data unremarkable aside from potassium 3.2, blood glucose 160, and troponin less than 0.03.  EKG with sinus rhythm at 85 bpm and some LVH noted.  2 view chest x-ray with no acute findings.  Patient was given full dose aspirin in the ED as well as some sublingual nitroglycerin with minor improvement in her pain symptoms.  Review of Systems: All others reviewed and otherwise negative.  Past Medical History:  Diagnosis Date  . Anxiety   . GERD (gastroesophageal reflux disease)   . Hx of shoulder surgery    Left Shoulder - pitched nerve.  . Other and unspecified hyperlipidemia   . Unspecified essential hypertension     Past Surgical History:  Procedure Laterality Date  . CERVICAL DISC SURGERY    .  CESAREAN SECTION  1970  . History of shoulder surgery Left    pitched nerve.     reports that she has never smoked. She has never used smokeless tobacco. She reports that she does not drink alcohol or use drugs.  No Known Allergies  Family History  Problem Relation Age of Onset  . Hypertension Unknown   . Heart attack Sister     Prior to Admission medications   Medication Sig Start Date End Date Taking? Authorizing Provider  ALPRAZolam Duanne Moron) 1 MG tablet Take 0.5-1 mg by mouth 2 (two) times daily. 0.5 tablet in the morning and 1 tablet at bedtime 03/16/12   [provider]  amLODipine (NORVASC) 10 MG tablet Take 10 mg by mouth daily. 07/14/17   [provider]  aspirin 81 MG tablet Take 81 mg by mouth daily.    [provider]  Calcium Carb-Cholecalciferol (CALCIUM 600+D3 PO) Take 600 Units by mouth daily. Includes Zinc also.    [provider]  chlorthalidone (HYGROTON) 25 MG tablet Take 25 mg by mouth daily.  11/23/16   [provider]  metoprolol 75 MG TABS Take 50 mg by mouth 2 (two) times daily. Patient taking differently: Take 25 mg by mouth daily.  12/26/15   Carmin Muskrat, MD  pantoprazole (PROTONIX) 40 MG tablet Take 1 tablet (40 mg total) by mouth daily before breakfast. 12/05/17   Rehman, Mechele Dawley, MD  polyethylene glycol (MIRALAX / GLYCOLAX) packet Take 17 g by mouth  daily.    [provider]  predniSONE (DELTASONE) 10 MG tablet Take 1 tablet (10 mg total) by mouth daily with breakfast. 06/02/18   Rogene Houston, MD    Physical Exam: Vitals:   06/08/18 2308 06/08/18 2311 06/08/18 2355 06/09/18 0034  BP:  (!) 195/65  (!) 144/61  Pulse:  94 81 73  Resp:  18 20 (!) 22  Temp:  98.1 F (36.7 C)    TempSrc:  Oral    SpO2:  96% 96% 96%  Weight: 86.2 kg     Height: 5\' 7"  (1.702 m)       Constitutional: NAD, calm, comfortable Vitals:   06/08/18 2308 06/08/18 2311 06/08/18 2355 06/09/18 0034  BP:  (!) 195/65  (!)  144/61  Pulse:  94 81 73  Resp:  18 20 (!) 22  Temp:  98.1 F (36.7 C)    TempSrc:  Oral    SpO2:  96% 96% 96%  Weight: 86.2 kg     Height: 5\' 7"  (1.702 m)      Eyes: lids and conjunctivae normal ENMT: Mucous membranes are moist.  Neck: normal, supple Respiratory: clear to auscultation bilaterally. Normal respiratory effort. No accessory muscle use.  Cardiovascular: Regular rate and rhythm, no murmurs. No extremity edema. Abdomen: no tenderness, no distention. Bowel sounds positive.  Musculoskeletal:  No joint deformity upper and lower extremities.  Tenderness to palpation over left chest wall. Skin: no rashes, lesions, ulcers.  Psychiatric: Feels slightly anxious.  Labs on Admission: I have personally reviewed following labs and imaging studies  CBC: Recent Labs  Lab 06/08/18 2344  WBC 7.2  NEUTROABS 3.2  HGB 11.8*  HCT 37.4  MCV 77.3*  PLT 703   Basic Metabolic Panel: Recent Labs  Lab 06/08/18 2344  NA 137  K 3.2*  CL 103  CO2 24  GLUCOSE 160*  BUN 18  CREATININE 0.67  CALCIUM 9.1   GFR: Estimated Creatinine Clearance: 58 mL/min (by C-G formula based on SCr of 0.67 mg/dL). Liver Function Tests: Recent Labs  Lab 06/08/18 2344  AST 127*  ALT 100*  ALKPHOS 68  BILITOT 0.6  PROT 7.0  ALBUMIN 3.8   Recent Labs  Lab 06/08/18 2344  LIPASE 37   No results for input(s): AMMONIA in the last 168 hours. Coagulation Profile: No results for input(s): INR, PROTIME in the last 168 hours. Cardiac Enzymes: Recent Labs  Lab 06/08/18 2344  TROPONINI <0.03   BNP (last 3 results) No results for input(s): PROBNP in the last 8760 hours. HbA1C: No results for input(s): HGBA1C in the last 72 hours. CBG: No results for input(s): GLUCAP in the last 168 hours. Lipid Profile: No results for input(s): CHOL, HDL, LDLCALC, TRIG, CHOLHDL, LDLDIRECT in the last 72 hours. Thyroid Function Tests: No results for input(s): TSH, T4TOTAL, FREET4, T3FREE, THYROIDAB in the  last 72 hours. Anemia Panel: No results for input(s): VITAMINB12, FOLATE, FERRITIN, TIBC, IRON, RETICCTPCT in the last 72 hours. Urine analysis:    Component Value Date/Time   COLORURINE YELLOW 12/26/2015 [place] 12/26/2015 1714   LABSPEC 1.020 12/26/2015 1714   PHURINE 5.5 12/26/2015 1714   GLUCOSEU NEGATIVE 12/26/2015 1714   HGBUR NEGATIVE 12/26/2015 1714   BILIRUBINUR NEGATIVE 12/26/2015 1714   KETONESUR NEGATIVE 12/26/2015 1714   PROTEINUR 30 (A) 12/26/2015 1714   NITRITE NEGATIVE 12/26/2015 1714   LEUKOCYTESUR NEGATIVE 12/26/2015 1714    Radiological Exams on Admission: Dg Chest 2 View  Result  Date: 06/09/2018 CLINICAL DATA:  Left anterior chest pain with shortness of breath starting at 10 p.m. EXAM: CHEST - 2 VIEW COMPARISON:  01/21/2018 FINDINGS: Shallow inspiration. Linear fibrosis or atelectasis in the mid lungs. Heart size and pulmonary vascularity are normal for technique. No consolidation or airspace infiltration. No blunting of costophrenic angles. No pneumothorax. Calcification of the aorta. Previous kyphoplasty change in the T12 vertebra. IMPRESSION: Shallow inspiration with linear fibrosis or atelectasis in the mid lungs. No focal consolidation. Aortic atherosclerosis. Electronically Signed   By: Lucienne Capers M.D.   On: 06/09/2018 00:28    EKG: Independently reviewed. SR at 85 bpm with LVH.  Assessment/Plan Principal Problem:   Chest pain Active Problems:   Fatty liver   Essential hypertension   Anxiety   GERD (gastroesophageal reflux disease)    1. Atypical chest pain-heart score 4.  This is likely related to her blood pressure elevations either due to poor control or likely recent steroid use.  She also has tenderness to her chest wall on palpation, and therefore this could also be musculoskeletal.  We will hold her home steroid as this was recently prescribed.  Continue to monitor troponins and check echocardiogram.  Maintain on aspirin.   Monitor on telemetry.  Pain is already improved at this time.  Recheck EKG in a.m.  Prior stress test noted in 03/2014 that was noted to be normal. 2. Fatty liver disease.  LFTs were elevated on account of this for which she had been started on steroids.  Continue to monitor and hold prednisone for now.  Rest of work-up for this has been within normal limits per outpatient note from Dr. Laural Golden. 3. Mild hypokalemia.  Replete orally and check magnesium in a.m. 4. Hypertension.  Appears to have poor control and may have some elevations from recent steroid use.  Continue home medications. 5. Anxiety.  Continue Xanax. 6. GERD.  Continue PPI.  She has some concerns of dysphagia that should be evaluated in the outpatient setting with GI.   DVT prophylaxis: Lovenox Code Status: DNR Family Communication: Daughter at bedside Disposition Plan:Chest pain evaluation Consults called:None Admission status: Obs, tele   Breda Bond Darleen Crocker DO [facility] Pager [phone]  If 7PM-7AM, please contact night-coverage [email or web address] Password [email or web address]  06/09/2018, 1:36 AM

## 2018-06-09 NOTE — Discharge Summary (Signed)
Physician Discharge Summary  Heather [name] CBJ:[id number] DOB: 13-Aug-1931 DOA: 06/08/2018  PCP: Glenda Chroman, MD Cardiologist: Dr. Harl Bowie  Admit date: 06/08/2018 Discharge date: 06/09/2018  Admitted From: Home  Disposition:  Home  Recommendations for Outpatient Follow-up:  1. Follow up with PCP in 1 weeks 2. Follow up with cardiology in 2 weeks   Home Health: PT  Discharge Condition: STABLE   CODE STATUS: DNR    Brief Hospitalization Summary: Please see all hospital notes, images, labs for full details of the hospitalization.  HPI: Heather [name] is a 84 y.o. female with medical history significant for GERD, anxiety, hypertension, and fatty liver disease with recent initiation of prednisone due to elevated LFTs who presented to the ED with complaints of some shaking that led to onset of chest pain in the left substernal region while she was on the phone speaking with her daughter.  Her symptoms began at approximately 10 PM and she felt as though her blood pressure was elevated.  She denies any radiation of the pain and had some associated shortness of breath, but no vomiting or diaphoresis.  Patient describes the pain as sharp and stabbing in nature with no particular aggravating or alleviating factors to include deep breath or cough.  The patient does have some chronic dysphasia, but states that the pain is unusual and different.  She did start prednisone this week as directed by her GI physician.   ED Course: All vital signs are stable, but she was noted to have some elevated blood pressure readings upon initial arrival.  Laboratory data unremarkable aside from potassium 3.2, blood glucose 160, and troponin less than 0.03.  EKG with sinus rhythm at 85 bpm and some LVH noted.  2 view chest x-ray with no acute findings.  Patient was given full dose aspirin in the ED as well as some sublingual nitroglycerin with minor improvement in her pain symptoms.  1. Atypical chest pain-heart  score 4.  This is likely related to her blood pressure elevations either due to poor control or likely recent steroid use.  She also has tenderness to her chest wall on palpation, and therefore this could also be musculoskeletal.  Her troponin has been negative x 3.  We will hold her home steroid as this was recently prescribed.  Follow up outpatient with PCP and cardiologist for further evaluation.  Discharge home.    Maintain on aspirin.  Monitor on telemetry.  Pain is resolved at this time.   Prior stress test noted in 03/2014 that was noted to be normal. 2. Fatty liver disease.  LFTs were elevated on account of this for which she had been started on steroids.  Continue to hold prednisone.  Rest of work-up for this has been within normal limits per outpatient note from Dr. Laural Golden. 3. Mild hypokalemia.  Repleted orally.  4. Hypertension.  Appears to have poor control and may have some elevations from recent steroid use.  Continue home medications.  Increased metoprolol to 25 mg BID, started clonidine 0.1 mg BID.  Follow up with PCP / cardiology in next 1-2 weeks.  5. Anxiety.  Continue Xanax. 6. GERD.  Continue PPI.  This should improve with stopping the steroids.   DVT prophylaxis: Lovenox Code Status: DNR Family Communication: Daughter at bedside Disposition Plan:Home Consults called:None Admission status: Obs, tele  Discharge Diagnoses:  Principal Problem:   Chest pain Active Problems:   Fatty liver   Essential hypertension   Anxiety   GERD (gastroesophageal reflux disease)  Discharge Instructions: Discharge Instructions    Call MD for:  difficulty breathing, headache or visual disturbances   Complete by:  As directed    Call MD for:  extreme fatigue   Complete by:  As directed    Call MD for:  persistant dizziness or light-headedness   Complete by:  As directed    Call MD for:  persistant nausea and vomiting   Complete by:  As directed    Call MD for:  severe uncontrolled  pain   Complete by:  As directed    Diet - low sodium heart healthy   Complete by:  As directed    Increase activity slowly   Complete by:  As directed      Allergies as of 06/09/2018   No Known Allergies     Medication List    STOP taking these medications   predniSONE 10 MG tablet Commonly known as:  DELTASONE     TAKE these medications   ALPRAZolam 1 MG tablet Commonly known as:  XANAX Take 0.5-1 mg by mouth 2 (two) times daily. 0.5 tablet in the morning and 1 tablet at bedtime   amLODipine 10 MG tablet Commonly known as:  NORVASC Take 10 mg by mouth daily.   aspirin 81 MG tablet Take 81 mg by mouth daily.   CALCIUM 600+D3 PO Take 600 Units by mouth daily. Includes Zinc also.   chlorthalidone 25 MG tablet Commonly known as:  HYGROTON Take 25 mg by mouth daily.   citalopram 20 MG tablet Commonly known as:  CELEXA Take 20 mg by mouth daily.   cloNIDine 0.1 MG tablet Commonly known as:  CATAPRES Take 1 tablet (0.1 mg total) by mouth 2 (two) times daily.   metoprolol tartrate 25 MG tablet Commonly known as:  LOPRESSOR Take 1 tablet (25 mg total) by mouth 2 (two) times daily. What changed:    medication strength  how much to take   omeprazole 40 MG capsule Commonly known as:  PRILOSEC Take 40 mg by mouth daily.   polyethylene glycol packet Commonly known as:  MIRALAX / GLYCOLAX Take 17 g by mouth daily.      Follow-up Information    [name], [name], MD. Schedule an appointment as soon as possible for a visit in 1 week(s).   Specialty:  Internal Medicine Contact information: [place] [address] [phone]        Arnoldo Lenis, MD. Schedule an appointment as soon as possible for a visit in 2 week(s).   Specialty:  Cardiology Why:  Hospital Follow Up  Contact information: [place] [id number] [phone]          No Known Allergies Allergies as of 06/09/2018   No Known Allergies      Medication List    STOP taking these medications   predniSONE 10 MG tablet Commonly known as:  DELTASONE     TAKE these medications   ALPRAZolam 1 MG tablet Commonly known as:  XANAX Take 0.5-1 mg by mouth 2 (two) times daily. 0.5 tablet in the morning and 1 tablet at bedtime   amLODipine 10 MG tablet Commonly known as:  NORVASC Take 10 mg by mouth daily.   aspirin 81 MG tablet Take 81 mg by mouth daily.   CALCIUM 600+D3 PO Take 600 Units by mouth daily. Includes Zinc also.   chlorthalidone 25 MG tablet Commonly known as:  HYGROTON Take 25 mg by  mouth daily.   citalopram 20 MG tablet Commonly known as:  CELEXA Take 20 mg by mouth daily.   cloNIDine 0.1 MG tablet Commonly known as:  CATAPRES Take 1 tablet (0.1 mg total) by mouth 2 (two) times daily.   metoprolol tartrate 25 MG tablet Commonly known as:  LOPRESSOR Take 1 tablet (25 mg total) by mouth 2 (two) times daily. What changed:    medication strength  how much to take   omeprazole 40 MG capsule Commonly known as:  PRILOSEC Take 40 mg by mouth daily.   polyethylene glycol packet Commonly known as:  MIRALAX / GLYCOLAX Take 17 g by mouth daily.       Procedures/Studies: Dg Chest 2 View  Result Date: 06/09/2018 CLINICAL DATA:  Left anterior chest pain with shortness of breath starting at 10 p.m. EXAM: CHEST - 2 VIEW COMPARISON:  01/21/2018 FINDINGS: Shallow inspiration. Linear fibrosis or atelectasis in the mid lungs. Heart size and pulmonary vascularity are normal for technique. No consolidation or airspace infiltration. No blunting of costophrenic angles. No pneumothorax. Calcification of the aorta. Previous kyphoplasty change in the T12 vertebra. IMPRESSION: Shallow inspiration with linear fibrosis or atelectasis in the mid lungs. No focal consolidation. Aortic atherosclerosis. Electronically Signed   By: Lucienne Capers M.D.   On: 06/09/2018 00:28   Ct Abdomen W Contrast  Result Date:  05/31/2018 CLINICAL DATA:  Epigastric abdominal pain for 6 months. Fatty liver. EXAM: CT ABDOMEN WITH CONTRAST TECHNIQUE: Multidetector CT imaging of the abdomen was performed using the standard protocol following bolus administration of intravenous contrast. CONTRAST:  126mL ISOVUE-300 IOPAMIDOL (ISOVUE-300) INJECTION 61% COMPARISON:  Abdomen MRI on 08/29/2016 FINDINGS: Lower chest: No acute findings. Hepatobiliary: No hepatic masses identified. Gallbladder is unremarkable. Pancreas:  No mass or inflammatory changes. Spleen:  Within normal limits in size and appearance. Adrenals/Urinary Tract: No masses identified. Several small renal cysts are again seen bilaterally. No evidence of hydronephrosis. Stomach/Bowel: Unremarkable. No hiatal hernia or epigastric abdominal wall hernia visualized. Vascular/Lymphatic: No pathologically enlarged lymph nodes identified. No abdominal aortic aneurysm. Aortic atherosclerosis. Other:  None. Musculoskeletal: No suspicious bone lesions identified. Previous T12 vertebroplasty and benign hemangioma of the L4 vertebral body noted. IMPRESSION: No acute findings or other significant abnormality. Electronically Signed   By: Earle Gell M.D.   On: 05/31/2018 07:20      Subjective:  Pt says she feels much better, no further chest pain.  BP has been fluctuating and symptoms usually a/w high BP readings.   Discharge Exam: Vitals:   06/09/18 0602 06/09/18 1015  BP: (!) 143/62 (!) 181/78  Pulse: 64   Resp: 17   Temp: 98.2 F (36.8 C)   SpO2: 93%    Vitals:   06/09/18 0230 06/09/18 0256 06/09/18 0602 06/09/18 1015  BP: (!) 152/64 (!) 177/71 (!) 143/62 (!) 181/78  Pulse: 69 79 64   Resp: 18 16 17    Temp:  98.2 F (36.8 C) 98.2 F (36.8 C)   TempSrc:  Oral Oral   SpO2: 95% 99% 93%   Weight:  86.8 kg    Height:  5\' 7"  (1.702 m)      General: Pt is alert, awake, not in acute distress Cardiovascular: normal S1/S2 +, no rubs, no gallops Respiratory: CTA bilaterally,  no wheezing, no rhonchi Abdominal: Soft, NT, ND, bowel sounds + Extremities:  no cyanosis   The results of significant diagnostics from this hospitalization (including imaging, microbiology, ancillary and laboratory) are listed below for reference.  Microbiology: No results found for this or any previous visit (from the past 240 hour(s)).   Labs: BNP (last 3 results) No results for input(s): BNP in the last 8760 hours. Basic Metabolic Panel: Recent Labs  Lab 06/08/18 2344 06/09/18 0440  NA 137  --   K 3.2*  --   CL 103  --   CO2 24  --   GLUCOSE 160*  --   BUN 18  --   CREATININE 0.67  --   CALCIUM 9.1  --   MG  --  2.0   Liver Function Tests: Recent Labs  Lab 06/08/18 2344  AST 127*  ALT 100*  ALKPHOS 68  BILITOT 0.6  PROT 7.0  ALBUMIN 3.8   Recent Labs  Lab 06/08/18 2344  LIPASE 37   No results for input(s): AMMONIA in the last 168 hours. CBC: Recent Labs  Lab 06/08/18 2344  WBC 7.2  NEUTROABS 3.2  HGB 11.8*  HCT 37.4  MCV 77.3*  PLT 236   Cardiac Enzymes: Recent Labs  Lab 06/08/18 2344 06/09/18 0440 06/09/18 1201  TROPONINI <0.03 <0.03 <0.03   BNP: Invalid input(s): POCBNP CBG: No results for input(s): GLUCAP in the last 168 hours. D-Dimer No results for input(s): DDIMER in the last 72 hours. Hgb A1c No results for input(s): HGBA1C in the last 72 hours. Lipid Profile No results for input(s): CHOL, HDL, LDLCALC, TRIG, CHOLHDL, LDLDIRECT in the last 72 hours. Thyroid function studies No results for input(s): TSH, T4TOTAL, T3FREE, THYROIDAB in the last 72 hours.  Invalid input(s): FREET3 Anemia work up No results for input(s): VITAMINB12, FOLATE, FERRITIN, TIBC, IRON, RETICCTPCT in the last 72 hours. Urinalysis    Component Value Date/Time   COLORURINE YELLOW 12/26/2015 1714   APPEARANCEUR CLEAR 12/26/2015 1714   LABSPEC 1.020 12/26/2015 1714   PHURINE 5.5 12/26/2015 1714   GLUCOSEU NEGATIVE 12/26/2015 1714   HGBUR NEGATIVE  12/26/2015 1714   BILIRUBINUR NEGATIVE 12/26/2015 1714   KETONESUR NEGATIVE 12/26/2015 1714   PROTEINUR 30 (A) 12/26/2015 1714   NITRITE NEGATIVE 12/26/2015 1714   LEUKOCYTESUR NEGATIVE 12/26/2015 1714   Sepsis Labs Invalid input(s): PROCALCITONIN,  WBC,  LACTICIDVEN Microbiology No results found for this or any previous visit (from the past 240 hour(s)).  Time coordinating discharge:   SIGNED:  Irwin Brakeman, MD  [facility] 06/09/2018, 1:11 PM Pager [phone]  If 7PM-7AM, please contact night-coverage [email or web address] Password [email or web address]

## 2018-06-09 NOTE — Progress Notes (Signed)
Heather [name] discharged Home per MD order.  Discharge instructions reviewed and discussed with the patient, all questions and concerns answered. Copy of instructions and scripts given to patient.  Allergies as of 06/09/2018   No Known Allergies     Medication List    STOP taking these medications   predniSONE 10 MG tablet Commonly known as:  DELTASONE     TAKE these medications   ALPRAZolam 1 MG tablet Commonly known as:  XANAX Take 0.5-1 mg by mouth 2 (two) times daily. 0.5 tablet in the morning and 1 tablet at bedtime   amLODipine 10 MG tablet Commonly known as:  NORVASC Take 10 mg by mouth daily.   aspirin 81 MG tablet Take 81 mg by mouth daily.   CALCIUM 600+D3 PO Take 600 Units by mouth daily. Includes Zinc also.   chlorthalidone 25 MG tablet Commonly known as:  HYGROTON Take 25 mg by mouth daily.   citalopram 20 MG tablet Commonly known as:  CELEXA Take 20 mg by mouth daily.   cloNIDine 0.1 MG tablet Commonly known as:  CATAPRES Take 1 tablet (0.1 mg total) by mouth 2 (two) times daily.   metoprolol tartrate 25 MG tablet Commonly known as:  LOPRESSOR Take 1 tablet (25 mg total) by mouth 2 (two) times daily. What changed:    medication strength  how much to take   omeprazole 40 MG capsule Commonly known as:  PRILOSEC Take 40 mg by mouth daily.   polyethylene glycol packet Commonly known as:  MIRALAX / GLYCOLAX Take 17 g by mouth daily.       Patients skin is clean, dry and intact, no evidence of skin break down. IV site discontinued and catheter remains intact. Site without signs and symptoms of complications. Dressing and pressure applied.  Patient escorted to car by NT in a wheelchair,  no distress noted upon discharge.  Heather [name] 06/09/2018 3:39 PM

## 2018-06-11 DIAGNOSIS — E785 Hyperlipidemia, unspecified: Secondary | ICD-10-CM | POA: Diagnosis not present

## 2018-06-11 DIAGNOSIS — Z7982 Long term (current) use of aspirin: Secondary | ICD-10-CM | POA: Diagnosis not present

## 2018-06-11 DIAGNOSIS — K219 Gastro-esophageal reflux disease without esophagitis: Secondary | ICD-10-CM | POA: Diagnosis not present

## 2018-06-11 DIAGNOSIS — K760 Fatty (change of) liver, not elsewhere classified: Secondary | ICD-10-CM | POA: Diagnosis not present

## 2018-06-11 DIAGNOSIS — F419 Anxiety disorder, unspecified: Secondary | ICD-10-CM | POA: Diagnosis not present

## 2018-06-11 DIAGNOSIS — Z9181 History of falling: Secondary | ICD-10-CM | POA: Diagnosis not present

## 2018-06-11 DIAGNOSIS — I10 Essential (primary) hypertension: Secondary | ICD-10-CM | POA: Diagnosis not present

## 2018-06-14 DIAGNOSIS — I10 Essential (primary) hypertension: Secondary | ICD-10-CM | POA: Diagnosis not present

## 2018-06-14 DIAGNOSIS — M159 Polyosteoarthritis, unspecified: Secondary | ICD-10-CM | POA: Diagnosis not present

## 2018-06-15 DIAGNOSIS — R42 Dizziness and giddiness: Secondary | ICD-10-CM | POA: Diagnosis not present

## 2018-06-15 DIAGNOSIS — I10 Essential (primary) hypertension: Secondary | ICD-10-CM | POA: Diagnosis not present

## 2018-06-15 DIAGNOSIS — Z299 Encounter for prophylactic measures, unspecified: Secondary | ICD-10-CM | POA: Diagnosis not present

## 2018-06-15 DIAGNOSIS — F321 Major depressive disorder, single episode, moderate: Secondary | ICD-10-CM | POA: Diagnosis not present

## 2018-06-15 DIAGNOSIS — E039 Hypothyroidism, unspecified: Secondary | ICD-10-CM | POA: Diagnosis not present

## 2018-06-18 DIAGNOSIS — G8929 Other chronic pain: Secondary | ICD-10-CM | POA: Diagnosis not present

## 2018-06-18 DIAGNOSIS — I6523 Occlusion and stenosis of bilateral carotid arteries: Secondary | ICD-10-CM | POA: Diagnosis not present

## 2018-06-18 DIAGNOSIS — R42 Dizziness and giddiness: Secondary | ICD-10-CM | POA: Diagnosis not present

## 2018-06-18 DIAGNOSIS — R1013 Epigastric pain: Secondary | ICD-10-CM | POA: Diagnosis not present

## 2018-06-18 DIAGNOSIS — K760 Fatty (change of) liver, not elsewhere classified: Secondary | ICD-10-CM | POA: Diagnosis not present

## 2018-06-18 LAB — HEPATIC FUNCTION PANEL
AG RATIO: 1.8 (calc) (ref 1.0–2.5)
ALKALINE PHOSPHATASE (APISO): 66 U/L (ref 33–130)
ALT: 94 U/L — AB (ref 6–29)
AST: 129 U/L — ABNORMAL HIGH (ref 10–35)
Albumin: 4.4 g/dL (ref 3.6–5.1)
Bilirubin, Direct: 0.1 mg/dL (ref 0.0–0.2)
GLOBULIN: 2.5 g/dL (ref 1.9–3.7)
Indirect Bilirubin: 0.4 mg/dL (calc) (ref 0.2–1.2)
TOTAL PROTEIN: 6.9 g/dL (ref 6.1–8.1)
Total Bilirubin: 0.5 mg/dL (ref 0.2–1.2)

## 2018-06-21 ENCOUNTER — Other Ambulatory Visit (INDEPENDENT_AMBULATORY_CARE_PROVIDER_SITE_OTHER): Payer: Self-pay | Admitting: *Deleted

## 2018-06-21 DIAGNOSIS — R748 Abnormal levels of other serum enzymes: Secondary | ICD-10-CM

## 2018-07-02 DIAGNOSIS — E041 Nontoxic single thyroid nodule: Secondary | ICD-10-CM | POA: Diagnosis not present

## 2018-07-02 DIAGNOSIS — E042 Nontoxic multinodular goiter: Secondary | ICD-10-CM | POA: Diagnosis not present

## 2018-07-05 ENCOUNTER — Ambulatory Visit: Payer: PPO | Admitting: Cardiology

## 2018-07-05 ENCOUNTER — Encounter: Payer: Self-pay | Admitting: Cardiology

## 2018-07-05 VITALS — BP 130/70 | HR 74 | Ht 67.0 in | Wt 193.0 lb

## 2018-07-05 DIAGNOSIS — I10 Essential (primary) hypertension: Secondary | ICD-10-CM

## 2018-07-05 DIAGNOSIS — R0789 Other chest pain: Secondary | ICD-10-CM

## 2018-07-05 NOTE — Progress Notes (Signed)
Clinical Summary Heather [name] is a 84 y.o.female seen today for follow up of the following medical problems.   1. Chest pain  - history of chest pain, prior cath in 2012 with non-obstructive disease.  - Lexiscan MPI 03/2014 without ischemia, normal LVEF   - ER admit 05/2018 with chest pain. Described as atypical, reproducible with palpation.  - trop neg x 3 - chronic soreness in chest unchanged.    2. HTN - elevated during recent admission. Started on clonidine 0.1mg  bid. Since that time pcp has discontinued clonidine - feeling dizzy for year, increased.     - husband passed few years ago He passed at home under home hospice due to CHF, kidney failure, and severe COPD.   Past Medical History:  Diagnosis Date  . Anxiety   . GERD (gastroesophageal reflux disease)   . Hx of shoulder surgery    Left Shoulder - pitched nerve.  . Other and unspecified hyperlipidemia   . Unspecified essential hypertension      No Known Allergies   Current Outpatient Medications  Medication Sig Dispense Refill  . ALPRAZolam (XANAX) 1 MG tablet Take 0.5-1 mg by mouth 2 (two) times daily. 0.5 tablet in the morning and 1 tablet at bedtime    . amLODipine (NORVASC) 10 MG tablet Take 10 mg by mouth daily.  12  . aspirin 81 MG tablet Take 81 mg by mouth daily.    . Calcium Carb-Cholecalciferol (CALCIUM 600+D3 PO) Take 600 Units by mouth daily. Includes Zinc also.    . chlorthalidone (HYGROTON) 25 MG tablet Take 25 mg by mouth daily.     . citalopram (CELEXA) 20 MG tablet Take 20 mg by mouth daily.    . cloNIDine (CATAPRES) 0.1 MG tablet Take 1 tablet (0.1 mg total) by mouth 2 (two) times daily. 60 tablet 11  . metoprolol tartrate (LOPRESSOR) 25 MG tablet Take 1 tablet (25 mg total) by mouth 2 (two) times daily. 60 tablet 0  . omeprazole (PRILOSEC) 40 MG capsule Take 40 mg by mouth daily.    . polyethylene glycol (MIRALAX / GLYCOLAX) packet Take 17 g by mouth daily.     No current  facility-administered medications for this visit.      Past Surgical History:  Procedure Laterality Date  . CERVICAL DISC SURGERY    . CESAREAN SECTION  1970  . History of shoulder surgery Left    pitched nerve.     No Known Allergies    Family History  Problem Relation Age of Onset  . Hypertension Unknown   . Heart attack Sister      Social History Ms. Blass reports that she has never smoked. She has never used smokeless tobacco. Ms. Brandi reports that she does not drink alcohol.   Review of Systems CONSTITUTIONAL: No weight loss, fever, chills, weakness or fatigue.  HEENT: Eyes: No visual loss, blurred vision, double vision or yellow sclerae.No hearing loss, sneezing, congestion, runny nose or sore throat.  SKIN: No rash or itching.  CARDIOVASCULAR: per hpi RESPIRATORY: No shortness of breath, cough or sputum.  GASTROINTESTINAL: No anorexia, nausea, vomiting or diarrhea. No abdominal pain or blood.  GENITOURINARY: No burning on urination, no polyuria NEUROLOGICAL: No headache, dizziness, syncope, paralysis, ataxia, numbness or tingling in the extremities. No change in bowel or bladder control.  MUSCULOSKELETAL: No muscle, back pain, joint pain or stiffness.  LYMPHATICS: No enlarged nodes. No history of splenectomy.  PSYCHIATRIC: No history of depression or anxiety.  ENDOCRINOLOGIC: No reports of sweating, cold or heat intolerance. No polyuria or polydipsia.  Marland Kitchen   Physical Examination Vitals:   07/05/18 1325  BP: 130/70  Pulse: 74  SpO2: 94%   Vitals:   07/05/18 1325  Weight: 193 lb (87.5 kg)  Height: 5\' 7"  (1.702 m)    Gen: resting comfortably, no acute distress HEENT: no scleral icterus, pupils equal round and reactive, no palptable cervical adenopathy,  CV: RRR, no m/r/g, no jvd. Right carotid bruit Resp: Clear to auscultation bilaterally GI: abdomen is soft, non-tender, non-distended, normal bowel sounds, no hepatosplenomegaly MSK:  extremities are warm, no edema.  Skin: warm, no rash Neuro:  no focal deficits Psych: appropriate affect   Diagnostic Studies 03/19/14 [facility]  LVEF 60-65%, abnormal diastolic dysfunction/grade not reported,   Cath 2012  HEMODYNAMIC DATA:  1. Central aortic pressure 154/67, mean 103.  2. LV pressure 166/19.  3. There was no gradient or pullback across aortic valve.  ANGIOGRAPHIC DATA:  1. Ventriculography was not performed. We have multiple sources of  ventricular function data.  2. The left main is large in caliber and free of critical disease.  3. The left anterior descending artery is fairly heavily calcified.  There are multiple 30% areas of narrowing throughout the mid and  distal vessel and has diffuse luminal irregularities all throughout  the mid vessel up to about 50% in the distal LAD, however, it does  appear to be smooth and somewhat diffusely plaqued. The apical LAD  goes to the apex. Prior to this, there are 2 diagonal branches.  The diagonal branches have some mild luminal irregularity, but did  not demonstrate critical disease.  4. The circumflex is a large-caliber vessel. There is an AV  circumflex that supplies 3 posterolateral branches with mild  luminal irregularity, but no critical stenoses. There is a very  large bifurcating marginal. The marginal distally goes out over  the apical tip. It is large in caliber and without critical  narrowing.  5. The right coronary artery is also somewhat calcified. The right  coronary artery has about 30-40% narrowing proximally and about 30-  40% narrowing distally prior to the PDA and PLA. The PDA and PLA  are widely patent, however.  CONCLUSIONS:  1. Nonobstructive coronary artery disease.  2. Chest pain.  DISPOSITION: The patient will be discharged in the morning with  followup with her primary care physician and Cardiology.  03/2014 Lexiscan MPI IMPRESSION:  Lexiscan  sestamibi: Electrically negative for ischemia Sestamibi  scan with evidence of normal perfusion, minimal soft tissue  attenuation. LVEF on gating calculated at greater than 80% with  normal wall motion.      Assessment and Plan  1. Chest pain - long history of chest pain with negative evaluations including negative cath in 2012, recent negative Lexiscan MPI - recent symptoms noncardiac, no additional testing is required at this time  2. HTN - at goal, continue current meds.   3. Carotid bruit - sounds like pcp obtained carotid US recently, we will request results  F/u 6 months   Arnoldo Lenis, M.D.

## 2018-07-05 NOTE — Patient Instructions (Signed)

## 2018-07-11 DIAGNOSIS — M159 Polyosteoarthritis, unspecified: Secondary | ICD-10-CM | POA: Diagnosis not present

## 2018-07-11 DIAGNOSIS — I10 Essential (primary) hypertension: Secondary | ICD-10-CM | POA: Diagnosis not present

## 2018-07-30 ENCOUNTER — Ambulatory Visit (INDEPENDENT_AMBULATORY_CARE_PROVIDER_SITE_OTHER): Payer: PPO | Admitting: Internal Medicine

## 2018-08-14 ENCOUNTER — Ambulatory Visit (INDEPENDENT_AMBULATORY_CARE_PROVIDER_SITE_OTHER): Payer: PPO | Admitting: Internal Medicine

## 2018-08-15 DIAGNOSIS — I10 Essential (primary) hypertension: Secondary | ICD-10-CM | POA: Diagnosis not present

## 2018-08-15 DIAGNOSIS — M159 Polyosteoarthritis, unspecified: Secondary | ICD-10-CM | POA: Diagnosis not present

## 2018-08-16 DIAGNOSIS — I10 Essential (primary) hypertension: Secondary | ICD-10-CM | POA: Diagnosis not present

## 2018-08-16 DIAGNOSIS — M19042 Primary osteoarthritis, left hand: Secondary | ICD-10-CM | POA: Diagnosis not present

## 2018-08-16 DIAGNOSIS — R42 Dizziness and giddiness: Secondary | ICD-10-CM | POA: Diagnosis not present

## 2018-08-16 DIAGNOSIS — M19041 Primary osteoarthritis, right hand: Secondary | ICD-10-CM | POA: Diagnosis not present

## 2018-08-16 DIAGNOSIS — R7309 Other abnormal glucose: Secondary | ICD-10-CM | POA: Diagnosis not present

## 2018-08-16 DIAGNOSIS — Z299 Encounter for prophylactic measures, unspecified: Secondary | ICD-10-CM | POA: Diagnosis not present

## 2018-08-16 DIAGNOSIS — Z6832 Body mass index (BMI) 32.0-32.9, adult: Secondary | ICD-10-CM | POA: Diagnosis not present

## 2018-08-20 ENCOUNTER — Other Ambulatory Visit (INDEPENDENT_AMBULATORY_CARE_PROVIDER_SITE_OTHER): Payer: Self-pay | Admitting: *Deleted

## 2018-08-20 ENCOUNTER — Encounter (INDEPENDENT_AMBULATORY_CARE_PROVIDER_SITE_OTHER): Payer: Self-pay | Admitting: *Deleted

## 2018-08-20 DIAGNOSIS — R748 Abnormal levels of other serum enzymes: Secondary | ICD-10-CM

## 2018-08-21 DIAGNOSIS — I7389 Other specified peripheral vascular diseases: Secondary | ICD-10-CM | POA: Diagnosis not present

## 2018-08-21 DIAGNOSIS — H81393 Other peripheral vertigo, bilateral: Secondary | ICD-10-CM | POA: Diagnosis not present

## 2018-08-21 DIAGNOSIS — G9009 Other idiopathic peripheral autonomic neuropathy: Secondary | ICD-10-CM | POA: Diagnosis not present

## 2018-08-30 ENCOUNTER — Ambulatory Visit (INDEPENDENT_AMBULATORY_CARE_PROVIDER_SITE_OTHER): Payer: PPO | Admitting: Orthopaedic Surgery

## 2018-08-30 ENCOUNTER — Encounter (INDEPENDENT_AMBULATORY_CARE_PROVIDER_SITE_OTHER): Payer: Self-pay | Admitting: Orthopaedic Surgery

## 2018-08-30 ENCOUNTER — Ambulatory Visit (INDEPENDENT_AMBULATORY_CARE_PROVIDER_SITE_OTHER): Payer: PPO

## 2018-08-30 VITALS — BP 109/52 | HR 68 | Ht 67.0 in | Wt 190.0 lb

## 2018-08-30 DIAGNOSIS — M79641 Pain in right hand: Secondary | ICD-10-CM

## 2018-08-30 DIAGNOSIS — G5601 Carpal tunnel syndrome, right upper limb: Secondary | ICD-10-CM

## 2018-08-31 ENCOUNTER — Encounter (INDEPENDENT_AMBULATORY_CARE_PROVIDER_SITE_OTHER): Payer: Self-pay | Admitting: Orthopaedic Surgery

## 2018-08-31 DIAGNOSIS — R748 Abnormal levels of other serum enzymes: Secondary | ICD-10-CM | POA: Diagnosis not present

## 2018-08-31 DIAGNOSIS — G5601 Carpal tunnel syndrome, right upper limb: Secondary | ICD-10-CM

## 2018-08-31 DIAGNOSIS — M79641 Pain in right hand: Secondary | ICD-10-CM | POA: Diagnosis not present

## 2018-08-31 LAB — HEPATIC FUNCTION PANEL
AG Ratio: 1.8 (calc) (ref 1.0–2.5)
ALBUMIN MSPROF: 4.2 g/dL (ref 3.6–5.1)
ALT: 86 U/L — ABNORMAL HIGH (ref 6–29)
AST: 106 U/L — AB (ref 10–35)
Alkaline phosphatase (APISO): 83 U/L (ref 33–130)
Bilirubin, Direct: 0.1 mg/dL (ref 0.0–0.2)
Globulin: 2.4 g/dL (calc) (ref 1.9–3.7)
Indirect Bilirubin: 0.3 mg/dL (calc) (ref 0.2–1.2)
TOTAL PROTEIN: 6.6 g/dL (ref 6.1–8.1)
Total Bilirubin: 0.4 mg/dL (ref 0.2–1.2)

## 2018-08-31 MED ORDER — METHYLPREDNISOLONE ACETATE 40 MG/ML IJ SUSP
13.3300 mg | INTRAMUSCULAR | Status: AC | PRN
  Administered 2018-08-31: 13.33 mg

## 2018-08-31 MED ORDER — LIDOCAINE HCL 1 % IJ SOLN
0.3000 mL | INTRAMUSCULAR | Status: AC | PRN
  Administered 2018-08-31: .3 mL

## 2018-08-31 MED ORDER — BUPIVACAINE HCL 0.25 % IJ SOLN
0.3300 mL | INTRAMUSCULAR | Status: AC | PRN
  Administered 2018-08-31: .33 mL

## 2018-08-31 NOTE — Progress Notes (Signed)
Office Visit Note   Patient: Heather [name]           Date of Birth: [birth year]           MRN: [id number] Visit Date: 08/30/2018              Requested by: Glenda Chroman, MD [place], [place] [address] PCP: Glenda Chroman, MD   Assessment & Plan: Visit Diagnoses:  1. Pain in right hand   2. Carpal tunnel syndrome, right upper limb     Plan: Carpal tunnel injection performed which she tolerated well.  Wrist splint applied that she can use at night.  If she has persistent symptoms on return 4 weeks we can discuss carpal tunnel release.  Follow-Up Instructions: Return in about 4 weeks (around 09/27/2018).   Orders:  Orders Placed This Encounter  Procedures  . XR Hand Complete Right   No orders of the defined types were placed in this encounter.     Procedures: Hand/UE Inj: R carpal tunnel for carpal tunnel syndrome on 08/31/2018 12:44 PM Medications: 0.3 mL lidocaine 1 %; 0.33 mL bupivacaine 0.25 %; 13.33 mg methylPREDNISolone acetate 40 MG/ML      Clinical Data: No additional findings.   Subjective: Chief Complaint  Patient presents with  . Right Hand - Pain    HPI 84 year old female with right hand numbness it bothers her at night she has to shake her hand.  It bothers her during the day.  Sometimes it is worked on squeezing a ball in hot water using some [place] gel as well as Aleve without significant improvement.  No associated neck pain.  Past history of positive EMGs nerve conduction velocities for right carpal tunnel syndrome years ago and surgery was recommended but she knew someone who had the surgery did not seem to get better and never proceeded with the carpal tunnel release.  Patient states she is having significant problems with this and notes weakness in her hand.  She is also noted small cyst adjacent to the [facility] joint fifth finger which is nontender.  She has significant arthritis in her PIP and DIP joints  Review of Systems 14 point review of  system positive for hypertension, GERD, insomnia, anxiety, fatty liver, right hand numbness otherwise negative is a pertains HPI.  Objective: Vital Signs: BP (!) 109/52   Pulse 68   Ht 5\' 7"  (1.702 m)   Wt 190 lb (86.2 kg)   BMI 29.76 kg/m   Physical Exam  Constitutional: She is oriented to person, place, and time. She appears well-developed.  HENT:  Head: Normocephalic.  Right Ear: External ear normal.  Left Ear: External ear normal.  Eyes: Pupils are equal, round, and reactive to light.  Neck: No tracheal deviation present. No thyromegaly present.  Cardiovascular: Normal rate.  Pulmonary/Chest: Effort normal.  Abdominal: Soft.  Neurological: She is alert and oriented to person, place, and time.  Skin: Skin is warm and dry.  Psychiatric: She has a normal mood and affect. Her behavior is normal.    Ortho Exam patient has deformities of the right DIP joint with angulation worse on the right long and index finger.  She flexes left again 1 to 2 cm touching the distal palmar crease.  There is thenar weakness with resisted abduction and thenar atrophy.  Positive carpal compression test positive Phalen's test.  No interossei weakness good cervical range of motion no brachial plexus tenderness.  Wrist pulses are normal.  Good capillary  refill.  Decreased sensation over the radial 3 and half fingers.  Specialty Comments:  No specialty comments available.  Imaging: Xr Hand Complete Right  Result Date: 08/31/2018 Three-view x-rays right hand obtained and reviewed.  This shows significant osteoarthritis DIP and PIP joints with loss of joint space spurring and angular deformity particularly at the DIP joint index and long finger.  Negative for acute fracture. 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    PMFS History: Patient Active Problem List   Diagnosis Date Noted  . Fatty liver 06/09/2018  . Essential hypertension 06/09/2018  . Anxiety  06/09/2018  . GERD (gastroesophageal reflux disease) 06/09/2018  . Palpitations 04/22/2012  . Chest pain 04/22/2012  . Coronary artery disease excluded 04/22/2012  . Insomnia 04/22/2012   Past Medical History:  Diagnosis Date  . Anxiety   . GERD (gastroesophageal reflux disease)   . Hx of shoulder surgery    Left Shoulder - pitched nerve.  . Other and unspecified hyperlipidemia   . Unspecified essential hypertension     Family History  Problem Relation Age of Onset  . Hypertension Unknown   . Heart attack Sister     Past Surgical History:  Procedure Laterality Date  . CERVICAL DISC SURGERY    . CESAREAN SECTION  1970  . History of shoulder surgery Left    pitched nerve.   Social History   Occupational History  . Not on file  Tobacco Use  . Smoking status: Never Smoker  . Smokeless tobacco: Never Used  Substance and Sexual Activity  . Alcohol use: No    Alcohol/week: 0.0 standard drinks  . Drug use: No  . Sexual activity: Not on file

## 2018-09-04 ENCOUNTER — Ambulatory Visit (INDEPENDENT_AMBULATORY_CARE_PROVIDER_SITE_OTHER): Payer: PPO | Admitting: Internal Medicine

## 2018-09-05 ENCOUNTER — Other Ambulatory Visit (INDEPENDENT_AMBULATORY_CARE_PROVIDER_SITE_OTHER): Payer: Self-pay | Admitting: *Deleted

## 2018-09-05 ENCOUNTER — Encounter (INDEPENDENT_AMBULATORY_CARE_PROVIDER_SITE_OTHER): Payer: Self-pay | Admitting: Internal Medicine

## 2018-09-05 ENCOUNTER — Ambulatory Visit (INDEPENDENT_AMBULATORY_CARE_PROVIDER_SITE_OTHER): Payer: PPO | Admitting: Internal Medicine

## 2018-09-05 VITALS — BP 140/70 | HR 68 | Temp 97.0°F | Resp 18 | Ht 67.0 in | Wt 196.8 lb

## 2018-09-05 DIAGNOSIS — K760 Fatty (change of) liver, not elsewhere classified: Secondary | ICD-10-CM

## 2018-09-05 DIAGNOSIS — R748 Abnormal levels of other serum enzymes: Secondary | ICD-10-CM | POA: Diagnosis not present

## 2018-09-05 NOTE — Patient Instructions (Signed)
Next LFTs in 3 months.  Office will call.

## 2018-09-05 NOTE — Progress Notes (Signed)
Presenting complaint;  Follow-up for abnormal LFTs/fatty liver.  Database and subjective:  Patient is 84 year old Caucasian female who has about 4-year history of elevated transaminases felt to be due to fatty liver.  There was concern for autoimmune liver disease based on blood work but she did not tolerate prednisone.  Recommended gradual weight loss and increasing physical activity but she has not been able to do so. She now returns for scheduled visit accompanied by her son. She had blood work last week. She has no new complaints.  She denies abdominal pain nausea or vomiting.  She is not having normal stools.  Previously she was constipated and had to take polyethylene glycol.  On review of her medications I noted that pantoprazole was discontinued and she was begun on omeprazole by Dr. Woody Seller. Her appetite is fair.  Her son states she does not eat much. She has not lost any weight since her last visit.   Current Medications: Outpatient Encounter Medications as of 09/05/2018  Medication Sig  . ALPRAZolam (XANAX) 1 MG tablet Take 0.5-1 mg by mouth 2 (two) times daily. 0.5 tablet in the morning and 1 tablet at bedtime  . amLODipine (NORVASC) 10 MG tablet Take 10 mg by mouth daily.  Marland Kitchen aspirin 81 MG tablet Take 81 mg by mouth daily.  . Calcium Carb-Cholecalciferol (CALCIUM 600+D3 PO) Take 600 Units by mouth daily. Includes Zinc also.  . metoprolol tartrate (LOPRESSOR) 25 MG tablet Take 25 mg by mouth 2 (two) times daily.  Marland Kitchen omeprazole (PRILOSEC) 40 MG capsule Take 40 mg by mouth daily.  Marland Kitchen VITAMIN D PO Take by mouth daily.  . metoprolol tartrate (LOPRESSOR) 25 MG tablet Take 1 tablet (25 mg total) by mouth 2 (two) times daily.  . polyethylene glycol (MIRALAX / GLYCOLAX) packet Take 17 g by mouth daily.   No facility-administered encounter medications on file as of 09/05/2018.      Objective: Blood pressure 140/70, pulse 68, temperature (!) 97 F (36.1 C), temperature source Oral, resp.  rate 18, height 5\' 7"  (1.702 m), weight 196 lb 12.8 oz (89.3 kg). Patient is alert and in no acute distress. Conjunctiva is pink. Sclera is nonicteric Oropharyngeal mucosa is normal. No neck masses or thyromegaly noted. Cardiac exam with regular rhythm normal S1 and S2. No murmur or gallop noted. Lungs are clear to auscultation. Abdomen is full but soft and nontender without hepato-or splenomegaly.  No masses either. No LE edema or clubbing noted.  Labs/studies Results: Lab data from 08/31/2018 Bilirubin 0.4, AP 83, AST 106, ALT 86, total protein 6.6 and albumin 4.2.  AST and ALT were 129 and 94 respectively on 06/18/2018. AST and ALT were 161 and 122 respectively on 05/08/2018.   Assessment:  #1.  Elevated transaminases felt to be due to fatty liver.  Once again AST and ALT are trending down which is reassuring.  She does not have stigmata of chronic liver disease.  CT in August last year did not show any changes of cirrhosis.  We will continue to monitor.  #2.  Change in bowel habits.  She has a history of constipation and was dependent on polyethylene glycol.  Now she is having normal bowel movements without polyethylene glycol.  Change in bowel habits felt to be due to omeprazole.  If she develops diarrhea PPI will have to be changed.  Plan:  She can use polyethylene glycol on as-needed basis. LFTs in 3 months. Office visit in 6 months.

## 2018-09-11 DIAGNOSIS — H40013 Open angle with borderline findings, low risk, bilateral: Secondary | ICD-10-CM | POA: Diagnosis not present

## 2018-09-11 DIAGNOSIS — H353131 Nonexudative age-related macular degeneration, bilateral, early dry stage: Secondary | ICD-10-CM | POA: Diagnosis not present

## 2018-09-11 DIAGNOSIS — H04123 Dry eye syndrome of bilateral lacrimal glands: Secondary | ICD-10-CM | POA: Diagnosis not present

## 2018-09-11 DIAGNOSIS — Z961 Presence of intraocular lens: Secondary | ICD-10-CM | POA: Diagnosis not present

## 2018-09-12 DIAGNOSIS — R42 Dizziness and giddiness: Secondary | ICD-10-CM | POA: Diagnosis not present

## 2018-09-12 DIAGNOSIS — Z299 Encounter for prophylactic measures, unspecified: Secondary | ICD-10-CM | POA: Diagnosis not present

## 2018-09-12 DIAGNOSIS — M19039 Primary osteoarthritis, unspecified wrist: Secondary | ICD-10-CM | POA: Diagnosis not present

## 2018-09-12 DIAGNOSIS — Z6832 Body mass index (BMI) 32.0-32.9, adult: Secondary | ICD-10-CM | POA: Diagnosis not present

## 2018-09-12 DIAGNOSIS — I10 Essential (primary) hypertension: Secondary | ICD-10-CM | POA: Diagnosis not present

## 2018-09-27 ENCOUNTER — Encounter (INDEPENDENT_AMBULATORY_CARE_PROVIDER_SITE_OTHER): Payer: Self-pay | Admitting: Orthopaedic Surgery

## 2018-09-27 ENCOUNTER — Ambulatory Visit (INDEPENDENT_AMBULATORY_CARE_PROVIDER_SITE_OTHER): Payer: PPO | Admitting: Orthopaedic Surgery

## 2018-09-27 VITALS — BP 117/60 | HR 83 | Ht 67.0 in | Wt 190.0 lb

## 2018-09-27 DIAGNOSIS — I10 Essential (primary) hypertension: Secondary | ICD-10-CM | POA: Diagnosis not present

## 2018-09-27 DIAGNOSIS — Z1331 Encounter for screening for depression: Secondary | ICD-10-CM | POA: Diagnosis not present

## 2018-09-27 DIAGNOSIS — Z1211 Encounter for screening for malignant neoplasm of colon: Secondary | ICD-10-CM | POA: Diagnosis not present

## 2018-09-27 DIAGNOSIS — F419 Anxiety disorder, unspecified: Secondary | ICD-10-CM | POA: Diagnosis not present

## 2018-09-27 DIAGNOSIS — Z1339 Encounter for screening examination for other mental health and behavioral disorders: Secondary | ICD-10-CM | POA: Diagnosis not present

## 2018-09-27 DIAGNOSIS — Z7189 Other specified counseling: Secondary | ICD-10-CM | POA: Diagnosis not present

## 2018-09-27 DIAGNOSIS — R5383 Other fatigue: Secondary | ICD-10-CM | POA: Diagnosis not present

## 2018-09-27 DIAGNOSIS — Z6832 Body mass index (BMI) 32.0-32.9, adult: Secondary | ICD-10-CM | POA: Diagnosis not present

## 2018-09-27 DIAGNOSIS — Z299 Encounter for prophylactic measures, unspecified: Secondary | ICD-10-CM | POA: Diagnosis not present

## 2018-09-27 DIAGNOSIS — G5601 Carpal tunnel syndrome, right upper limb: Secondary | ICD-10-CM

## 2018-09-27 DIAGNOSIS — Z0000 Encounter for general adult medical examination without abnormal findings: Secondary | ICD-10-CM | POA: Diagnosis not present

## 2018-09-27 NOTE — Progress Notes (Signed)
Office Visit Note   Patient: Heather [name]           Date of Birth: July 03, 1931           MRN: [id number] Visit Date: 09/27/2018              Requested by: Glenda Chroman, MD [place], [place] [address] PCP: Glenda Chroman, MD   Assessment & Plan: Visit Diagnoses:  1. Carpal tunnel syndrome, right upper limb     Plan: To you wrist splint at night she can wear when she does activities during the day.  She can remove it for washing and bathing.  She like to schedule carpal tunnel release after the holidays.  Card given so she can call for surgical scheduling as an outpatient.  Follow-Up Instructions: No follow-ups on file.   Orders:  No orders of the defined types were placed in this encounter.  No orders of the defined types were placed in this encounter.     Procedures: No procedures performed   Clinical Data: No additional findings.   Subjective: Chief Complaint  Patient presents with  . Right Hand - Follow-up    HPI 84 year old female returns with persistent problems with right carpal tunnel syndrome.  She got a little bit better after the carpal tunnel injection as far as bending her finger she has significant DIP arthritis.  Previous positive electrical test many years ago.  She has to wear a brace at night and uses it when she goes out.  She still has pain but not as severe as prior to the injection.  She states she thinks she like to proceed with carpal tunnel release after the holidays.  Review of Systems for hypertension, GERD, insomnia, and anxiety, fatty liver, right carpal tunnel syndrome with positive electrical tests.  Systems otherwise negative.   Objective: Vital Signs: BP 117/60   Pulse 83   Ht 5\' 7"  (1.702 m)   Wt 190 lb (86.2 kg)   BMI 29.76 kg/m   Physical Exam  Constitutional: She is oriented to person, place, and time. She appears well-developed.  HENT:  Head: Normocephalic.  Right Ear: External ear normal.  Left Ear: External  ear normal.  Eyes: Pupils are equal, round, and reactive to light.  Neck: No tracheal deviation present. No thyromegaly present.  Cardiovascular: Normal rate.  Pulmonary/Chest: Effort normal.  Abdominal: Soft.  Neurological: She is alert and oriented to person, place, and time.  Skin: Skin is warm and dry.  Psychiatric: She has a normal mood and affect. Her behavior is normal.    Ortho Exam right carpal compression test. Flexion 1 to 2 cm touching distal palmar crease.  Thenar atrophy with weakness of resisted abduction.  No hyperthenar atrophy.  Positive Phalen's test positive Tinel's over the carpal canal.  Ossei are strong.  No pain with cervical range of motion or brachial plexus compression. Specialty Comments:  No specialty comments available.  Imaging: No results found.   PMFS History: Patient Active Problem List   Diagnosis Date Noted  . Carpal tunnel syndrome, right upper limb 08/31/2018  . Fatty liver 06/09/2018  . Essential hypertension 06/09/2018  . Anxiety 06/09/2018  . GERD (gastroesophageal reflux disease) 06/09/2018  . Palpitations 04/22/2012  . Chest pain 04/22/2012  . Coronary artery disease excluded 04/22/2012  . Insomnia 04/22/2012   Past Medical History:  Diagnosis Date  . Anxiety   . GERD (gastroesophageal reflux disease)   . Hx of shoulder surgery  Left Shoulder - pitched nerve.  . Other and unspecified hyperlipidemia   . Unspecified essential hypertension     Family History  Problem Relation Age of Onset  . Hypertension Unknown   . Heart attack Sister     Past Surgical History:  Procedure Laterality Date  . CERVICAL DISC SURGERY    . CESAREAN SECTION  1970  . History of shoulder surgery Left    pitched nerve.   Social History   Occupational History  . Not on file  Tobacco Use  . Smoking status: Never Smoker  . Smokeless tobacco: Never Used  Substance and Sexual Activity  . Alcohol use: No    Alcohol/week: 0.0 standard drinks  .  Drug use: No  . Sexual activity: Not on file

## 2018-10-03 DIAGNOSIS — I10 Essential (primary) hypertension: Secondary | ICD-10-CM | POA: Diagnosis not present

## 2018-10-03 DIAGNOSIS — M159 Polyosteoarthritis, unspecified: Secondary | ICD-10-CM | POA: Diagnosis not present

## 2018-10-13 DIAGNOSIS — Z79899 Other long term (current) drug therapy: Secondary | ICD-10-CM | POA: Diagnosis not present

## 2018-10-13 DIAGNOSIS — K219 Gastro-esophageal reflux disease without esophagitis: Secondary | ICD-10-CM | POA: Diagnosis not present

## 2018-10-13 DIAGNOSIS — B369 Superficial mycosis, unspecified: Secondary | ICD-10-CM | POA: Diagnosis not present

## 2018-10-13 DIAGNOSIS — L309 Dermatitis, unspecified: Secondary | ICD-10-CM | POA: Diagnosis not present

## 2018-10-13 DIAGNOSIS — R21 Rash and other nonspecific skin eruption: Secondary | ICD-10-CM | POA: Diagnosis not present

## 2018-10-13 DIAGNOSIS — R419 Unspecified symptoms and signs involving cognitive functions and awareness: Secondary | ICD-10-CM | POA: Diagnosis not present

## 2018-10-13 DIAGNOSIS — I10 Essential (primary) hypertension: Secondary | ICD-10-CM | POA: Diagnosis not present

## 2018-11-05 ENCOUNTER — Other Ambulatory Visit (INDEPENDENT_AMBULATORY_CARE_PROVIDER_SITE_OTHER): Payer: Self-pay | Admitting: Orthopaedic Surgery

## 2018-11-05 DIAGNOSIS — G5601 Carpal tunnel syndrome, right upper limb: Secondary | ICD-10-CM | POA: Diagnosis not present

## 2018-11-05 MED ORDER — TRAMADOL HCL 50 MG PO TABS: 50.0000 mg | ORAL_TABLET | Freq: Four times a day (QID) | ORAL | 0 refills | Status: DC | PRN

## 2018-11-15 ENCOUNTER — Ambulatory Visit (INDEPENDENT_AMBULATORY_CARE_PROVIDER_SITE_OTHER): Payer: PPO | Admitting: Orthopaedic Surgery

## 2018-11-15 ENCOUNTER — Encounter (INDEPENDENT_AMBULATORY_CARE_PROVIDER_SITE_OTHER): Payer: Self-pay | Admitting: Orthopaedic Surgery

## 2018-11-15 VITALS — BP 127/63 | HR 71 | Ht 67.0 in | Wt 190.0 lb

## 2018-11-15 DIAGNOSIS — Z9889 Other specified postprocedural states: Secondary | ICD-10-CM

## 2018-11-15 DIAGNOSIS — G5601 Carpal tunnel syndrome, right upper limb: Secondary | ICD-10-CM

## 2018-11-15 NOTE — Progress Notes (Signed)
Postop right carpal tunnel release incision looks good she will return next week for suture removal with Hassan Rowan.  She is happy with the surgical result opposite hand does not bother her and she can follow-up with me as needed.

## 2018-11-19 ENCOUNTER — Encounter (INDEPENDENT_AMBULATORY_CARE_PROVIDER_SITE_OTHER): Payer: Self-pay | Admitting: *Deleted

## 2018-11-19 ENCOUNTER — Other Ambulatory Visit (INDEPENDENT_AMBULATORY_CARE_PROVIDER_SITE_OTHER): Payer: Self-pay | Admitting: *Deleted

## 2018-11-19 DIAGNOSIS — R748 Abnormal levels of other serum enzymes: Secondary | ICD-10-CM

## 2018-11-21 DIAGNOSIS — M159 Polyosteoarthritis, unspecified: Secondary | ICD-10-CM | POA: Diagnosis not present

## 2018-11-21 DIAGNOSIS — I10 Essential (primary) hypertension: Secondary | ICD-10-CM | POA: Diagnosis not present

## 2018-12-07 DIAGNOSIS — R748 Abnormal levels of other serum enzymes: Secondary | ICD-10-CM | POA: Diagnosis not present

## 2018-12-08 LAB — HEPATIC FUNCTION PANEL
AG RATIO: 1.5 (calc) (ref 1.0–2.5)
ALKALINE PHOSPHATASE (APISO): 85 U/L (ref 37–153)
ALT: 71 U/L — ABNORMAL HIGH (ref 6–29)
AST: 87 U/L — AB (ref 10–35)
Albumin: 4.3 g/dL (ref 3.6–5.1)
BILIRUBIN INDIRECT: 0.4 mg/dL (ref 0.2–1.2)
Bilirubin, Direct: 0.1 mg/dL (ref 0.0–0.2)
Globulin: 2.9 g/dL (calc) (ref 1.9–3.7)
TOTAL PROTEIN: 7.2 g/dL (ref 6.1–8.1)
Total Bilirubin: 0.5 mg/dL (ref 0.2–1.2)

## 2018-12-17 DIAGNOSIS — F419 Anxiety disorder, unspecified: Secondary | ICD-10-CM | POA: Diagnosis not present

## 2018-12-17 DIAGNOSIS — Z6832 Body mass index (BMI) 32.0-32.9, adult: Secondary | ICD-10-CM | POA: Diagnosis not present

## 2018-12-17 DIAGNOSIS — B3789 Other sites of candidiasis: Secondary | ICD-10-CM | POA: Diagnosis not present

## 2018-12-17 DIAGNOSIS — Z299 Encounter for prophylactic measures, unspecified: Secondary | ICD-10-CM | POA: Diagnosis not present

## 2018-12-17 DIAGNOSIS — R21 Rash and other nonspecific skin eruption: Secondary | ICD-10-CM | POA: Diagnosis not present

## 2018-12-17 DIAGNOSIS — Z789 Other specified health status: Secondary | ICD-10-CM | POA: Diagnosis not present

## 2018-12-17 DIAGNOSIS — I10 Essential (primary) hypertension: Secondary | ICD-10-CM | POA: Diagnosis not present

## 2018-12-17 DIAGNOSIS — K5904 Chronic idiopathic constipation: Secondary | ICD-10-CM | POA: Diagnosis not present

## 2019-02-11 DIAGNOSIS — Z299 Encounter for prophylactic measures, unspecified: Secondary | ICD-10-CM | POA: Diagnosis not present

## 2019-02-11 DIAGNOSIS — E559 Vitamin D deficiency, unspecified: Secondary | ICD-10-CM | POA: Diagnosis not present

## 2019-02-11 DIAGNOSIS — E539 Vitamin B deficiency, unspecified: Secondary | ICD-10-CM | POA: Diagnosis not present

## 2019-02-11 DIAGNOSIS — N390 Urinary tract infection, site not specified: Secondary | ICD-10-CM | POA: Diagnosis not present

## 2019-02-11 DIAGNOSIS — R5383 Other fatigue: Secondary | ICD-10-CM | POA: Diagnosis not present

## 2019-02-20 DIAGNOSIS — M159 Polyosteoarthritis, unspecified: Secondary | ICD-10-CM | POA: Diagnosis not present

## 2019-02-20 DIAGNOSIS — I10 Essential (primary) hypertension: Secondary | ICD-10-CM | POA: Diagnosis not present

## 2019-02-22 DIAGNOSIS — I10 Essential (primary) hypertension: Secondary | ICD-10-CM | POA: Diagnosis not present

## 2019-02-22 DIAGNOSIS — M159 Polyosteoarthritis, unspecified: Secondary | ICD-10-CM | POA: Diagnosis not present

## 2019-03-05 ENCOUNTER — Ambulatory Visit (INDEPENDENT_AMBULATORY_CARE_PROVIDER_SITE_OTHER): Payer: PPO | Admitting: Internal Medicine

## 2019-03-07 ENCOUNTER — Ambulatory Visit (INDEPENDENT_AMBULATORY_CARE_PROVIDER_SITE_OTHER): Payer: PPO | Admitting: Internal Medicine

## 2019-03-15 DIAGNOSIS — Z6833 Body mass index (BMI) 33.0-33.9, adult: Secondary | ICD-10-CM | POA: Diagnosis not present

## 2019-03-15 DIAGNOSIS — I10 Essential (primary) hypertension: Secondary | ICD-10-CM | POA: Diagnosis not present

## 2019-03-15 DIAGNOSIS — R0602 Shortness of breath: Secondary | ICD-10-CM | POA: Diagnosis not present

## 2019-03-15 DIAGNOSIS — Z299 Encounter for prophylactic measures, unspecified: Secondary | ICD-10-CM | POA: Diagnosis not present

## 2019-03-15 DIAGNOSIS — J069 Acute upper respiratory infection, unspecified: Secondary | ICD-10-CM | POA: Diagnosis not present

## 2019-03-15 DIAGNOSIS — E119 Type 2 diabetes mellitus without complications: Secondary | ICD-10-CM | POA: Diagnosis not present

## 2019-03-18 ENCOUNTER — Other Ambulatory Visit: Payer: Self-pay

## 2019-03-18 ENCOUNTER — Emergency Department (HOSPITAL_COMMUNITY)
Admission: EM | Admit: 2019-03-18 | Discharge: 2019-03-18 | Disposition: A | Discharge status: Home / Self Care | Payer: PPO | Attending: Emergency Medicine | Admitting: Emergency Medicine

## 2019-03-18 ENCOUNTER — Emergency Department (HOSPITAL_COMMUNITY): Payer: PPO

## 2019-03-18 ENCOUNTER — Encounter (HOSPITAL_COMMUNITY): Payer: Self-pay | Admitting: Emergency Medicine

## 2019-03-18 DIAGNOSIS — R1310 Dysphagia, unspecified: Secondary | ICD-10-CM

## 2019-03-18 DIAGNOSIS — R0602 Shortness of breath: Secondary | ICD-10-CM | POA: Diagnosis not present

## 2019-03-18 DIAGNOSIS — R0609 Other forms of dyspnea: Secondary | ICD-10-CM | POA: Insufficient documentation

## 2019-03-18 DIAGNOSIS — R079 Chest pain, unspecified: Secondary | ICD-10-CM | POA: Diagnosis not present

## 2019-03-18 DIAGNOSIS — I10 Essential (primary) hypertension: Secondary | ICD-10-CM | POA: Diagnosis not present

## 2019-03-18 LAB — COMPREHENSIVE METABOLIC PANEL
ALT: 80 U/L — ABNORMAL HIGH (ref 0–44)
AST: 115 U/L — ABNORMAL HIGH (ref 15–41)
Albumin: 3.5 g/dL (ref 3.5–5.0)
Alkaline Phosphatase: 76 U/L (ref 38–126)
Anion gap: 10 (ref 5–15)
BUN: 10 mg/dL (ref 8–23)
CO2: 22 mmol/L (ref 22–32)
Calcium: 8.9 mg/dL (ref 8.9–10.3)
Chloride: 107 mmol/L (ref 98–111)
Creatinine, Ser: 0.64 mg/dL (ref 0.44–1.00)
GFR calc Af Amer: >60 mL/min (ref >60)
GFR calc non Af Amer: >60 mL/min (ref >60)
Glucose, Bld: 123 mg/dL — ABNORMAL HIGH (ref 70–99)
Potassium: 3.5 mmol/L (ref 3.5–5.1)
Sodium: 139 mmol/L (ref 135–145)
Total Bilirubin: 0.4 mg/dL (ref 0.3–1.2)
Total Protein: 6.6 g/dL (ref 6.5–8.1)

## 2019-03-18 LAB — CBC
HCT: 37.2 % (ref 36.0–46.0)
Hemoglobin: 11.4 g/dL — ABNORMAL LOW (ref 12.0–15.0)
MCH: 24.4 pg — ABNORMAL LOW (ref 26.0–34.0)
MCHC: 30.6 g/dL (ref 30.0–36.0)
MCV: 79.5 fL — ABNORMAL LOW (ref 80.0–100.0)
Platelets: 215 10*3/uL (ref 150–400)
RBC: 4.68 MIL/uL (ref 3.87–5.11)
RDW: 19 % — ABNORMAL HIGH (ref 11.5–15.5)
WBC: 6.8 10*3/uL (ref 4.0–10.5)
nRBC: 0 % (ref 0.0–0.2)

## 2019-03-18 LAB — TROPONIN I
Troponin I: <0.03 ng/mL (ref <0.03)
Troponin I: <0.03 ng/mL (ref <0.03)

## 2019-03-18 LAB — BRAIN NATRIURETIC PEPTIDE: B Natriuretic Peptide: 50 pg/mL (ref 0.0–100.0)

## 2019-03-18 LAB — LIPASE, BLOOD: Lipase: 26 U/L (ref 11–51)

## 2019-03-18 NOTE — ED Provider Notes (Signed)
Pt received at sign out with 2nd troponin pending. Please see previous EDP note for full H&P/HPI/MDM. 2nd troponin negative. Will d/c stable.    Francine Graven, DO 03/18/19 2216

## 2019-03-18 NOTE — ED Notes (Signed)
Pt's daughter in law, Seth Bake, called stating she would like to leave her number to be contacted. [phone], please call with any updates.

## 2019-03-18 NOTE — Discharge Instructions (Signed)
You were evaluated in the Emergency Department and after careful evaluation, we did not find any emergent condition requiring admission or further testing in the hospital.  Your testing today was reassuring and did not show any emergencies or heart damage.  We think he would benefit from a follow-up with a gastroenterologist to further explore your issues with swallowing.  Otherwise, please follow-up with your primary care doctor to further discuss your issues with your breathing.  Please return to the Emergency Department if you experience any worsening of your condition.  We encourage you to follow up with a primary care provider.  Thank you for allowing [place] to be a part of your care.

## 2019-03-18 NOTE — ED Notes (Signed)
Ambulated pt to restroom on pulse ox. Pt maintained 95% on room air. edp aware

## 2019-03-18 NOTE — ED Provider Notes (Signed)
[facility] Emergency Department Provider Note MRN:  [id number]  Arrival date & time: 03/18/19     Chief Complaint   Chest Pain   History of Present Illness   Heather [name] is a 85 y.o. year-old female with a history of GERD, anxiety, hypertension presenting to the ED with chief complaint of chest pain.  Patient has numerous complaints today.  She is most concerned about persistent dyspnea on exertion has been present for 3 months.  Explains that she has great difficulty walking to her car, becomes very short of breath with minimal exertion.  Currently denies any symptoms.  She endorses central chest burning pain after eating, describing it as if the food is getting stuck in her chest.  She also explains that she has an intermittent different kind of chest pain on the left side that is sharp and intermittent and random.  She denies dizziness or diaphoresis, no nausea, no vomiting, no diarrhea, no abdominal pain.  She explains that she is here today because she is very frustrated with her continued symptoms and wants help with them.  Review of Systems  A complete 10 system review of systems was obtained and all systems are negative except as noted in the HPI and PMH.   Patient's Health History    Past Medical History:  Diagnosis Date  . Anxiety   . GERD (gastroesophageal reflux disease)   . Hx of shoulder surgery    Left Shoulder - pitched nerve.  . Other and unspecified hyperlipidemia   . Unspecified essential hypertension     Past Surgical History:  Procedure Laterality Date  . CERVICAL DISC SURGERY    . CESAREAN SECTION  1970  . History of shoulder surgery Left    pitched nerve.    Family History  Problem Relation Age of Onset  . Hypertension Other   . Heart attack Sister     Social History   Socioeconomic History  . Marital status: Widowed    Spouse name: Not on file  . Number of children: Not on file  . Years of education: Not on file  .  Highest education level: Not on file  Occupational History  . Not on file  Social Needs  . Financial resource strain: Not on file  . Food insecurity:    Worry: Not on file    Inability: Not on file  . Transportation needs:    Medical: Not on file    Non-medical: Not on file  Tobacco Use  . Smoking status: Never Smoker  . Smokeless tobacco: Never Used  Substance and Sexual Activity  . Alcohol use: No    Alcohol/week: 0.0 standard drinks  . Drug use: No  . Sexual activity: Not on file  Lifestyle  . Physical activity:    Days per week: Not on file    Minutes per session: Not on file  . Stress: Not on file  Relationships  . Social connections:    Talks on phone: Not on file    Gets together: Not on file    Attends religious service: Not on file    Active member of club or organization: Not on file    Attends meetings of clubs or organizations: Not on file    Relationship status: Not on file  . Intimate partner violence:    Fear of current or ex partner: Not on file    Emotionally abused: Not on file    Physically abused: Not on file  Forced sexual activity: Not on file  Other Topics Concern  . Not on file  Social History Narrative  . Not on file     Physical Exam  Vital Signs and Nursing Notes reviewed Vitals:   03/18/19 1710  BP: (!) 164/60  Pulse: 90  Resp: (!) 24  Temp: 98.4 F (36.9 C)  SpO2: 97%    CONSTITUTIONAL: Well-appearing, NAD NEURO:  Alert and oriented x 3, no focal deficits EYES:  eyes equal and reactive ENT/NECK:  no LAD, no JVD CARDIO: Regular rate, well-perfused, normal S1 and S2 PULM:  CTAB no wheezing or rhonchi GI/GU:  normal bowel sounds, non-distended, non-tender MSK/SPINE:  No gross deformities, no edema SKIN:  no rash, atraumatic PSYCH:  Appropriate speech and behavior  Diagnostic and Interventional Summary    EKG Interpretation  Date/Time:  Monday Mar 18 2019 17:09:47 EDT Ventricular Rate:  88 PR Interval:    QRS Duration:  109 QT Interval:  381 QTC Calculation: 461 R Axis:   -32 Text Interpretation:  Sinus rhythm Incomplete left bundle branch block LVH with secondary repolarization abnormality Confirmed by Gerlene Fee [phone]) on 03/18/2019 5:34:56 PM      Labs Reviewed  CBC - Abnormal; Notable for the following components:      Result Value   Hemoglobin 11.4 (*)    MCV 79.5 (*)    MCH 24.4 (*)    RDW 19.0 (*)    All other components within normal limits  COMPREHENSIVE METABOLIC PANEL - Abnormal; Notable for the following components:   Glucose, Bld 123 (*)    AST 115 (*)    ALT 80 (*)    All other components within normal limits  BRAIN NATRIURETIC PEPTIDE  TROPONIN I  LIPASE, BLOOD  TROPONIN I    DG Chest Port 1 View  Final Result      Medications - No data to display   Procedures Critical Care  ED Course and Medical Decision Making  I have reviewed the triage vital signs and the nursing notes.  Pertinent labs & imaging results that were available during my care of the patient were reviewed by me and considered in my medical decision making (see below for details).  Chronic dyspnea on exertion, considering anemia, CHF, less likely COPD given the lack of wheezing on exam.  Patient is without evidence of DVT, this is all very gradual onset, little to no concern for pulmonary embolism.  Patient has 2 different types of chest discomfort, one likely GI, the other likely musculoskeletal.  Patient had a normal stress test a few years ago, had a recent chest pain rule out admission less than a year ago that did not reveal any cardiac concerns.  Patient will be appropriate for a 2 troponin rule out here in the emergency department.  She likely needs cardiology and GI follow-up.  I was able to speak with patient's daughter who confirmed the overall story.  Daughter was more concerned with her breathing issues with ambulation.  I personally evaluated the patient's ambulation here with pulse ox,  maintained oxygen saturations above 94%, did not exhibit any increased work of breathing or tachypnea.  Daughter is agreeable with discharge with referral to GI and follow-up with PCP to further investigate her dyspnea and exertion.  Second troponin still pending, signed out to Dr. Thurnell Garbe at shift change.  Barth Kirks. Sedonia Small, MD [place] [email or web address]  Final Clinical Impressions(s) / ED Diagnoses  ICD-10-CM   1. Chest pain, unspecified type R07.9   2. DOE (dyspnea on exertion) R06.09 DG Chest [facility] 1 View    DG Chest [place] 1 View  3. Dysphagia, unspecified type R13.10     ED Discharge Orders    None         Maudie Flakes, MD 03/18/19 2001

## 2019-03-18 NOTE — ED Triage Notes (Signed)
Pt started having chest pain today.  Took nitro x1 and aspirin 243mg  pta.  Pt was also having some shortness of breath.

## 2019-03-26 DIAGNOSIS — Z299 Encounter for prophylactic measures, unspecified: Secondary | ICD-10-CM | POA: Diagnosis not present

## 2019-03-26 DIAGNOSIS — F321 Major depressive disorder, single episode, moderate: Secondary | ICD-10-CM | POA: Diagnosis not present

## 2019-03-26 DIAGNOSIS — I10 Essential (primary) hypertension: Secondary | ICD-10-CM | POA: Diagnosis not present

## 2019-03-26 DIAGNOSIS — E039 Hypothyroidism, unspecified: Secondary | ICD-10-CM | POA: Diagnosis not present

## 2019-03-26 DIAGNOSIS — R1310 Dysphagia, unspecified: Secondary | ICD-10-CM | POA: Diagnosis not present

## 2019-04-01 DIAGNOSIS — Z8719 Personal history of other diseases of the digestive system: Secondary | ICD-10-CM | POA: Diagnosis not present

## 2019-04-01 DIAGNOSIS — R0602 Shortness of breath: Secondary | ICD-10-CM | POA: Diagnosis not present

## 2019-04-01 DIAGNOSIS — R1900 Intra-abdominal and pelvic swelling, mass and lump, unspecified site: Secondary | ICD-10-CM | POA: Diagnosis not present

## 2019-04-02 DIAGNOSIS — I10 Essential (primary) hypertension: Secondary | ICD-10-CM | POA: Diagnosis not present

## 2019-04-02 DIAGNOSIS — Z299 Encounter for prophylactic measures, unspecified: Secondary | ICD-10-CM | POA: Diagnosis not present

## 2019-04-02 DIAGNOSIS — F321 Major depressive disorder, single episode, moderate: Secondary | ICD-10-CM | POA: Diagnosis not present

## 2019-04-02 DIAGNOSIS — R1310 Dysphagia, unspecified: Secondary | ICD-10-CM | POA: Diagnosis not present

## 2019-04-02 DIAGNOSIS — Z6832 Body mass index (BMI) 32.0-32.9, adult: Secondary | ICD-10-CM | POA: Diagnosis not present

## 2019-04-02 DIAGNOSIS — E039 Hypothyroidism, unspecified: Secondary | ICD-10-CM | POA: Diagnosis not present

## 2019-04-08 ENCOUNTER — Ambulatory Visit (INDEPENDENT_AMBULATORY_CARE_PROVIDER_SITE_OTHER): Payer: PPO | Admitting: Internal Medicine

## 2019-04-08 ENCOUNTER — Other Ambulatory Visit: Payer: Self-pay

## 2019-04-08 ENCOUNTER — Encounter (INDEPENDENT_AMBULATORY_CARE_PROVIDER_SITE_OTHER): Payer: Self-pay | Admitting: Internal Medicine

## 2019-04-08 VITALS — BP 130/69 | HR 62 | Temp 97.8°F | Ht 67.0 in | Wt 198.6 lb

## 2019-04-08 DIAGNOSIS — R740 Nonspecific elevation of levels of transaminase and lactic acid dehydrogenase [LDH]: Secondary | ICD-10-CM | POA: Diagnosis not present

## 2019-04-08 DIAGNOSIS — R7401 Elevation of levels of liver transaminase levels: Secondary | ICD-10-CM

## 2019-04-08 NOTE — Progress Notes (Signed)
Subjective:    Patient ID: Heather [name], female    DOB: 28-Nov-1930, 85 y.o.   MRN: [id number]  HPI Here today for f/u. Hx of elevated LFTs. Last seen by Dr .Laural Golden in November of 2019. Noted, her enzymes have slowly increased.  She walks with a walker. She says she is out of breath. Her appetite is okay. She has gained 2 1/2 pounds since her last visit. She cannot exercise because she says she is SOB. Her BMs move okay. She takes Miralax for her BMs. No melena or BRRB.  Auto immune process ruled out back in 2018.    Hepatic Function Latest Ref Rng & Units 03/18/2019 12/07/2018 08/31/2018  Total Protein 6.5 - 8.1 g/dL 6.6 7.2 6.6  Albumin 3.5 - 5.0 g/dL 3.5 - -  AST 15 - 41 U/L 115(H) 87(H) 106(H)  ALT 0 - 44 U/L 80(H) 71(H) 86(H)  Alk Phosphatase 38 - 126 U/L 76 - -  Total Bilirubin 0.3 - 1.2 mg/dL 0.4 0.5 0.4  Bilirubin, Direct 0.0 - 0.2 mg/dL - 0.1 0.1      Review of Systems Past Medical History:  Diagnosis Date  . Anxiety   . GERD (gastroesophageal reflux disease)   . Hx of shoulder surgery    Left Shoulder - pitched nerve.  . Other and unspecified hyperlipidemia   . Unspecified essential hypertension     Past Surgical History:  Procedure Laterality Date  . CERVICAL DISC SURGERY    . CESAREAN SECTION  1970  . History of shoulder surgery Left    pitched nerve.    No Known Allergies  Current Outpatient Medications on File Prior to Visit  Medication Sig Dispense Refill  . albuterol (VENTOLIN HFA) 108 (90 Base) MCG/ACT inhaler Inhale 1 puff into the lungs 4 (four) times daily.     Marland Kitchen ALPRAZolam (XANAX) 1 MG tablet Take 1 mg by mouth 2 (two) times daily.     Marland Kitchen amLODipine (NORVASC) 10 MG tablet Take 10 mg by mouth every morning.   12  . aspirin 81 MG tablet Take 243 mg by mouth once as needed for pain.     . Calcium Carb-Cholecalciferol (CALCIUM 600+D3 PO) Take 600 Units by mouth daily.     . fluorometholone (FML) 0.1 % ophthalmic suspension Place 1 drop into both  eyes 2 (two) times daily.   1  . metoprolol tartrate (LOPRESSOR) 25 MG tablet Take 25 mg by mouth 2 (two) times daily.  3  . nitroGLYCERIN (NITROSTAT) 0.4 MG SL tablet Place 0.4 mg under the tongue every 5 (five) minutes as needed for chest pain.    Marland Kitchen omeprazole (PRILOSEC) 40 MG capsule Take 40 mg by mouth daily.    . polyethylene glycol (MIRALAX / GLYCOLAX) packet Take 17 g by mouth daily as needed for mild constipation or moderate constipation.  14 each   . VITAMIN D PO Take 1 capsule by mouth daily.      No current facility-administered medications on file prior to visit.         Objective:   Physical Exam Blood pressure 130/69, pulse 62, temperature 97.8 F (36.6 C), height '5\' 7"'  (1.702 m), weight 198 lb 9.6 oz (90.1 kg). 'Alert and oriented. Skin warm and dry. Oral mucosa is moist.   . Sclera anicteric, conjunctivae is pink. Thyroid not enlarged. No cervical lymphadenopathy. Lungs clear. Heart regular rate and rhythm.  Abdomen is soft. Bowel sounds are positive. No hepatomegaly. No abdominal masses felt. No  tenderness.  No edema to lower extremities.         Assessment & Plan:  Elevated transaminases felt to be due to fatty liver. AST and ALT have gone up. No evidence of cirrhosis.  Will repeat in 3 months.

## 2019-04-08 NOTE — Patient Instructions (Signed)
Labs in 3 months. OV in 6 months.

## 2019-04-17 DIAGNOSIS — M159 Polyosteoarthritis, unspecified: Secondary | ICD-10-CM | POA: Diagnosis not present

## 2019-04-17 DIAGNOSIS — I10 Essential (primary) hypertension: Secondary | ICD-10-CM | POA: Diagnosis not present

## 2019-04-19 DIAGNOSIS — I10 Essential (primary) hypertension: Secondary | ICD-10-CM | POA: Diagnosis not present

## 2019-05-09 DIAGNOSIS — I10 Essential (primary) hypertension: Secondary | ICD-10-CM | POA: Diagnosis not present

## 2019-05-09 DIAGNOSIS — Z299 Encounter for prophylactic measures, unspecified: Secondary | ICD-10-CM | POA: Diagnosis not present

## 2019-05-09 DIAGNOSIS — Z6832 Body mass index (BMI) 32.0-32.9, adult: Secondary | ICD-10-CM | POA: Diagnosis not present

## 2019-05-09 DIAGNOSIS — F419 Anxiety disorder, unspecified: Secondary | ICD-10-CM | POA: Diagnosis not present

## 2019-05-09 DIAGNOSIS — S22080A Wedge compression fracture of T11-T12 vertebra, initial encounter for closed fracture: Secondary | ICD-10-CM | POA: Diagnosis not present

## 2019-05-09 DIAGNOSIS — K5900 Constipation, unspecified: Secondary | ICD-10-CM | POA: Diagnosis not present

## 2019-05-14 DIAGNOSIS — M159 Polyosteoarthritis, unspecified: Secondary | ICD-10-CM | POA: Diagnosis not present

## 2019-05-14 DIAGNOSIS — I10 Essential (primary) hypertension: Secondary | ICD-10-CM | POA: Diagnosis not present

## 2019-05-22 DIAGNOSIS — I10 Essential (primary) hypertension: Secondary | ICD-10-CM | POA: Diagnosis not present

## 2019-06-11 DIAGNOSIS — I10 Essential (primary) hypertension: Secondary | ICD-10-CM | POA: Diagnosis not present

## 2019-06-11 DIAGNOSIS — M159 Polyosteoarthritis, unspecified: Secondary | ICD-10-CM | POA: Diagnosis not present

## 2019-06-19 DIAGNOSIS — E119 Type 2 diabetes mellitus without complications: Secondary | ICD-10-CM | POA: Diagnosis not present

## 2019-06-19 DIAGNOSIS — Z299 Encounter for prophylactic measures, unspecified: Secondary | ICD-10-CM | POA: Diagnosis not present

## 2019-06-19 DIAGNOSIS — Z6834 Body mass index (BMI) 34.0-34.9, adult: Secondary | ICD-10-CM | POA: Diagnosis not present

## 2019-06-19 DIAGNOSIS — Z713 Dietary counseling and surveillance: Secondary | ICD-10-CM | POA: Diagnosis not present

## 2019-06-19 DIAGNOSIS — J40 Bronchitis, not specified as acute or chronic: Secondary | ICD-10-CM | POA: Diagnosis not present

## 2019-06-24 DIAGNOSIS — I10 Essential (primary) hypertension: Secondary | ICD-10-CM | POA: Diagnosis not present

## 2019-07-02 ENCOUNTER — Ambulatory Visit (INDEPENDENT_AMBULATORY_CARE_PROVIDER_SITE_OTHER): Payer: PPO | Admitting: Nurse Practitioner

## 2019-07-04 DIAGNOSIS — Z299 Encounter for prophylactic measures, unspecified: Secondary | ICD-10-CM | POA: Diagnosis not present

## 2019-07-04 DIAGNOSIS — Z6834 Body mass index (BMI) 34.0-34.9, adult: Secondary | ICD-10-CM | POA: Diagnosis not present

## 2019-07-04 DIAGNOSIS — I10 Essential (primary) hypertension: Secondary | ICD-10-CM | POA: Diagnosis not present

## 2019-07-04 DIAGNOSIS — F419 Anxiety disorder, unspecified: Secondary | ICD-10-CM | POA: Diagnosis not present

## 2019-07-04 DIAGNOSIS — K589 Irritable bowel syndrome without diarrhea: Secondary | ICD-10-CM | POA: Diagnosis not present

## 2019-07-04 DIAGNOSIS — F321 Major depressive disorder, single episode, moderate: Secondary | ICD-10-CM | POA: Diagnosis not present

## 2019-07-09 ENCOUNTER — Ambulatory Visit (INDEPENDENT_AMBULATORY_CARE_PROVIDER_SITE_OTHER): Payer: PPO | Admitting: Internal Medicine

## 2019-07-15 DIAGNOSIS — I10 Essential (primary) hypertension: Secondary | ICD-10-CM | POA: Diagnosis not present

## 2019-07-15 DIAGNOSIS — M159 Polyosteoarthritis, unspecified: Secondary | ICD-10-CM | POA: Diagnosis not present

## 2019-07-24 DIAGNOSIS — I10 Essential (primary) hypertension: Secondary | ICD-10-CM | POA: Diagnosis not present

## 2019-08-12 ENCOUNTER — Other Ambulatory Visit (INDEPENDENT_AMBULATORY_CARE_PROVIDER_SITE_OTHER): Payer: Self-pay | Admitting: Internal Medicine

## 2019-08-12 ENCOUNTER — Encounter (INDEPENDENT_AMBULATORY_CARE_PROVIDER_SITE_OTHER): Payer: Self-pay | Admitting: Internal Medicine

## 2019-08-12 ENCOUNTER — Other Ambulatory Visit: Payer: Self-pay

## 2019-08-12 ENCOUNTER — Ambulatory Visit (INDEPENDENT_AMBULATORY_CARE_PROVIDER_SITE_OTHER): Payer: PPO | Admitting: Internal Medicine

## 2019-08-12 DIAGNOSIS — R21 Rash and other nonspecific skin eruption: Secondary | ICD-10-CM

## 2019-08-12 DIAGNOSIS — R109 Unspecified abdominal pain: Secondary | ICD-10-CM

## 2019-08-12 DIAGNOSIS — K5900 Constipation, unspecified: Secondary | ICD-10-CM | POA: Insufficient documentation

## 2019-08-12 DIAGNOSIS — R7989 Other specified abnormal findings of blood chemistry: Secondary | ICD-10-CM

## 2019-08-12 MED ORDER — BISACODYL 10 MG RE SUPP: 10.0000 mg | Freq: Every day | RECTAL | 0 refills | Status: DC | PRN

## 2019-08-12 MED ORDER — NYSTATIN-TRIAMCINOLONE 100000-0.1 UNIT/GM-% EX CREA: 1.0000 "application " | TOPICAL_CREAM | Freq: Two times a day (BID) | CUTANEOUS | 1 refills | Status: DC

## 2019-08-12 NOTE — Patient Instructions (Signed)
Use Dulcolax suppository every morning for the next 10 days unless you have a spontaneous bowel movement. Thereafter use on as-needed basis. Physician will call with results of blood test and urinalysis.

## 2019-08-12 NOTE — Progress Notes (Signed)
Presenting complaint;  Abdominal pain. History of elevated transaminases.  Database and subjective:  Patient is 85 year old Caucasian female who has history of elevated transaminases and has been followed since April 2018.  Etiology is most likely fatty liver.  She was given short course of prednisone with the thought that she may have autoimmune disease but she did not tolerate prednisone.  She was last seen in June 2020. Now she returns with new complaint.  She complains of abdominal pain which started 2 months ago.  Pain starts in epigastric periumbilical region after breakfast and radiates down into left lower quadrant.  She describes this pain to be quite intense.  Pain goes away after her bowels move.  She does not have this pain when she wakes up.  She does not have nausea or vomiting.  She has chronic constipation.  In the past she relied mainly on polyethylene glycol and now she is having to take linaclotide 290 mcg daily.  She was given samples by Dr. Woody Seller.  She says she cannot afford this medication.  At times she has a good evacuation and on other occasions she may pass small amount of stool.  She tells me that last week she passed so much stool that it clogged her commode and her grandson came and helped her. She says she is not able to do much physical activity on account of breathing problems.  She feels quite frustrated as she continues to gain weight.  She says she does not eat much at all.  She has gained 7 pounds since her last visit 4 months ago.  She has gained 11 pounds in the last 2 years.  She does not take OTC NSAIDs.  No history of peptic ulcer disease.  She states she was tested for Covid 2 weeks ago because of respiratory problems but she tested negative.  She already has received flu shot. She also complains of increased urinary frequency and urgency.  At times she is not able to make it to the bathroom.  She denies dysuria fever or chills. Finally she also complains of  pruritic rash in both groins.  She stated she has been using some lotion but she is run out of prescription and does not remember the name.  She also has had rash under her breasts in the past.  Current Medications: Outpatient Encounter Medications as of 08/12/2019  Medication Sig  . albuterol (VENTOLIN HFA) 108 (90 Base) MCG/ACT inhaler Inhale 1 puff into the lungs 4 (four) times daily.   Marland Kitchen ALPRAZolam (XANAX) 1 MG tablet Take 1 mg by mouth 2 (two) times daily.   Marland Kitchen amLODipine (NORVASC) 10 MG tablet Take 10 mg by mouth every morning.   Marland Kitchen aspirin 81 MG tablet Take 243 mg by mouth once as needed for pain.   . Calcium Carb-Cholecalciferol (CALCIUM 600+D3 PO) Take 600 Units by mouth daily.   . fluorometholone (FML) 0.1 % ophthalmic suspension Place 1 drop into both eyes 2 (two) times daily.   Marland Kitchen linaclotide (LINZESS) 290 MCG CAPS capsule Take 290 mcg by mouth daily before breakfast.  . metoprolol tartrate (LOPRESSOR) 25 MG tablet Take 25 mg by mouth 2 (two) times daily.  . nitroGLYCERIN (NITROSTAT) 0.4 MG SL tablet Place 0.4 mg under the tongue every 5 (five) minutes as needed for chest pain.  Marland Kitchen omeprazole (PRILOSEC) 40 MG capsule Take 40 mg by mouth daily.  . polyethylene glycol (MIRALAX / GLYCOLAX) packet Take 17 g by mouth daily as needed for  mild constipation or moderate constipation.   Marland Kitchen VITAMIN D PO Take 1 capsule by mouth daily.    No facility-administered encounter medications on file as of 08/12/2019.      Objective: Blood pressure (!) 152/74, pulse 94, temperature 98.2 F (36.8 C), temperature source Oral, height 5' 7" (1.702 m), weight 205 lb 9.6 oz (93.3 kg). Patient is alert and in no acute distress. Conjunctiva is pink. Sclera is nonicteric Oropharyngeal mucosa is normal. No neck masses or thyromegaly noted. Cardiac exam with regular rhythm normal S1 and S2. No murmur or gallop noted. Lungs are clear to auscultation. Abdomen is full.  Bowel sounds are normal.  No bruit noted.   She has mild tenderness in epigastrium.  No organomegaly or masses. Erythematous rash noted in both inguinal regions. No LE edema or clubbing noted.  Labs/studies Results:  CBC Latest Ref Rng & Units 03/18/2019 06/08/2018 02/07/2017  WBC 4.0 - 10.5 K/uL 6.8 7.2 6.4  Hemoglobin 12.0 - 15.0 g/dL 11.4(L) 11.8(L) 13.2  Hematocrit 36.0 - 46.0 % 37.2 37.4 41.0  Platelets 150 - 400 K/uL 215 236 227    CMP Latest Ref Rng & Units 03/18/2019 12/07/2018 08/31/2018  Glucose 70 - 99 mg/dL 123(H) - -  BUN 8 - 23 mg/dL 10 - -  Creatinine 0.44 - 1.00 mg/dL 0.64 - -  Sodium 135 - 145 mmol/L 139 - -  Potassium 3.5 - 5.1 mmol/L 3.5 - -  Chloride 98 - 111 mmol/L 107 - -  CO2 22 - 32 mmol/L 22 - -  Calcium 8.9 - 10.3 mg/dL 8.9 - -  Total Protein 6.5 - 8.1 g/dL 6.6 7.2 6.6  Total Bilirubin 0.3 - 1.2 mg/dL 0.4 0.5 0.4  Alkaline Phos 38 - 126 U/L 76 - -  AST 15 - 41 U/L 115(H) 87(H) 106(H)  ALT 0 - 44 U/L 80(H) 71(H) 86(H)    Hepatic Function Latest Ref Rng & Units 03/18/2019 12/07/2018 08/31/2018  Total Protein 6.5 - 8.1 g/dL 6.6 7.2 6.6  Albumin 3.5 - 5.0 g/dL 3.5 - -  AST 15 - 41 U/L 115(H) 87(H) 106(H)  ALT 0 - 44 U/L 80(H) 71(H) 86(H)  Alk Phosphatase 38 - 126 U/L 76 - -  Total Bilirubin 0.3 - 1.2 mg/dL 0.4 0.5 0.4  Bilirubin, Direct 0.0 - 0.2 mg/dL - 0.1 0.1    Last abdominal ultrasound done on 11/27/2017.  It was negative for cholelithiasis dilated bile duct or any hepatic abnormalities.  She had abdominal pelvic CT on 05/30/2018 with contrast and no abnormality noted.  Study was done for epigastric pain.  Assessment:  #1.  Abdominal pain of 2 months duration triggered with breakfast and relieved with defecation.  I suspect pain secondary to constipation.  I doubt peptic ulcer disease or ischemic injury.  If he does not respond to therapy would consider abdominal pelvic CT with contrast.  #2.Elevated transaminases.  She has had them for more than 2-1/2 years.  Working diagnosis is fatty liver.  On  prior imaging studies she does not have stigmata of portal hypertension.  We will continue to monitor at this time  #3.  Fungal skin rash involving both inguinal areas.  #4.  Increased urinary frequency and urgency.   Plan:  Patient will continue linaclotide at a dose of 2 9 0 mcg p.o. every morning.  28-day supply of samples given to the patient. Patient advised to use Dulcolax suppository every morning for the next 10 days and thereafter on as-needed  basis. Mycolog-II cream to be applied to area over the skin rash twice daily until rash resolves. She will go to the lab for CBC, comprehensive chemistry panel and UA to be followed by culture if UA is abnormal. Office visit in 3 months.

## 2019-08-13 ENCOUNTER — Other Ambulatory Visit (INDEPENDENT_AMBULATORY_CARE_PROVIDER_SITE_OTHER): Payer: Self-pay | Admitting: Internal Medicine

## 2019-08-13 DIAGNOSIS — I10 Essential (primary) hypertension: Secondary | ICD-10-CM | POA: Diagnosis not present

## 2019-08-13 DIAGNOSIS — M159 Polyosteoarthritis, unspecified: Secondary | ICD-10-CM | POA: Diagnosis not present

## 2019-08-13 LAB — COMPREHENSIVE METABOLIC PANEL
AG Ratio: 1.4 (calc) (ref 1.0–2.5)
ALT: 147 U/L — ABNORMAL HIGH (ref 6–29)
AST: 185 U/L — ABNORMAL HIGH (ref 10–35)
Albumin: 4.2 g/dL (ref 3.6–5.1)
Alkaline phosphatase (APISO): 74 U/L (ref 37–153)
BUN/Creatinine Ratio: 23 (calc) — ABNORMAL HIGH (ref 6–22)
BUN: 13 mg/dL (ref 7–25)
CO2: 24 mmol/L (ref 20–32)
Calcium: 9.9 mg/dL (ref 8.6–10.4)
Chloride: 103 mmol/L (ref 98–110)
Creat: 0.56 mg/dL — ABNORMAL LOW (ref 0.60–0.88)
Globulin: 3 g/dL (calc) (ref 1.9–3.7)
Glucose, Bld: 78 mg/dL (ref 65–139)
Potassium: 4.3 mmol/L (ref 3.5–5.3)
Sodium: 141 mmol/L (ref 135–146)
Total Bilirubin: 0.4 mg/dL (ref 0.2–1.2)
Total Protein: 7.2 g/dL (ref 6.1–8.1)

## 2019-08-13 LAB — CBC
HCT: 40.7 % (ref 35.0–45.0)
Hemoglobin: 13.1 g/dL (ref 11.7–15.5)
MCH: 26.2 pg — ABNORMAL LOW (ref 27.0–33.0)
MCHC: 32.2 g/dL (ref 32.0–36.0)
MCV: 81.4 fL (ref 80.0–100.0)
MPV: 12.6 fL — ABNORMAL HIGH (ref 7.5–12.5)
Platelets: 251 10*3/uL (ref 140–400)
RBC: 5 10*6/uL (ref 3.80–5.10)
RDW: 16.5 % — ABNORMAL HIGH (ref 11.0–15.0)
WBC: 9.6 10*3/uL (ref 3.8–10.8)

## 2019-08-13 MED ORDER — CIPROFLOXACIN HCL 250 MG PO TABS: 250.0000 mg | ORAL_TABLET | Freq: Two times a day (BID) | ORAL | 0 refills | Status: DC

## 2019-08-14 DIAGNOSIS — F419 Anxiety disorder, unspecified: Secondary | ICD-10-CM | POA: Diagnosis not present

## 2019-08-14 DIAGNOSIS — F321 Major depressive disorder, single episode, moderate: Secondary | ICD-10-CM | POA: Diagnosis not present

## 2019-08-14 DIAGNOSIS — Z299 Encounter for prophylactic measures, unspecified: Secondary | ICD-10-CM | POA: Diagnosis not present

## 2019-08-14 DIAGNOSIS — I10 Essential (primary) hypertension: Secondary | ICD-10-CM | POA: Diagnosis not present

## 2019-08-14 DIAGNOSIS — Z6834 Body mass index (BMI) 34.0-34.9, adult: Secondary | ICD-10-CM | POA: Diagnosis not present

## 2019-08-14 LAB — CULTURE INDICATED

## 2019-08-14 LAB — URINALYSIS W MICROSCOPIC + REFLEX CULTURE
Bacteria, UA: NONE SEEN /HPF
Bilirubin Urine: NEGATIVE
Glucose, UA: NEGATIVE
Hgb urine dipstick: NEGATIVE
Hyaline Cast: NONE SEEN /LPF
Nitrites, Initial: NEGATIVE
Specific Gravity, Urine: 1.023 (ref 1.001–1.03)
pH: <=5 (ref 5.0–8.0)

## 2019-08-14 LAB — URINE CULTURE
MICRO NUMBER:: 1005755
SPECIMEN QUALITY:: ADEQUATE

## 2019-08-19 ENCOUNTER — Other Ambulatory Visit (INDEPENDENT_AMBULATORY_CARE_PROVIDER_SITE_OTHER): Payer: Self-pay | Admitting: *Deleted

## 2019-08-19 DIAGNOSIS — R7989 Other specified abnormal findings of blood chemistry: Secondary | ICD-10-CM

## 2019-08-28 ENCOUNTER — Ambulatory Visit (INDEPENDENT_AMBULATORY_CARE_PROVIDER_SITE_OTHER): Payer: PPO | Admitting: Nurse Practitioner

## 2019-09-11 ENCOUNTER — Telehealth: Payer: PPO | Admitting: Cardiology

## 2019-09-11 NOTE — Progress Notes (Unsigned)
{Choose 1 Note Type (Telehealth Visit or Telephone Visit):[phone]}   Date:  09/11/2019   ID:  Heather [name], DOB 09-19-1931, MRN [id number]  {Patient Location:[phone]::"Home"} {Provider Location:[id number]::"Home"}  PCP:  Glenda Chroman, MD  Cardiologist:  No primary care provider on file. *** Electrophysiologist:  None   Evaluation Performed:  {Choose Visit Type:[id number]::"Follow-Up Visit"}  Chief Complaint:  ***  History of Present Illness:    Heather [name] is a 85 y.o. female seen today for follow up of the following medical problems.   1. Chest pain  - history of chest pain, prior cath in 2012 with non-obstructive disease.  - Lexiscan MPI 03/2014 without ischemia, normal LVEF   - ER admit 05/2018 with chest pain. Described as atypical, reproducible with palpation.  - trop neg x 3 - chronic soreness in chest unchanged.    2. HTN - elevated during recent admission. Started on clonidine 0.1mg  bid. Since that time pcp has discontinued clonidine - feeling dizzy for year, increased.     - husband passed few years ago He passed at home under home hospice due to CHF, kidney failure, and severe COPD.   The patient {does/does not:200015} have symptoms concerning for COVID-19 infection (fever, chills, cough, or new shortness of breath).    Past Medical History:  Diagnosis Date  . Anxiety   . GERD (gastroesophageal reflux disease)   . Hx of shoulder surgery    Left Shoulder - pitched nerve.  . Other and unspecified hyperlipidemia   . Unspecified essential hypertension    Past Surgical History:  Procedure Laterality Date  . CERVICAL DISC SURGERY    . CESAREAN SECTION  1970  . History of shoulder surgery Left    pitched nerve.     No outpatient medications have been marked as taking for the 09/11/19 encounter (Appointment) with Arnoldo Lenis, MD.     Allergies:   Patient has no known allergies.   Social History   Tobacco Use  .  Smoking status: Never Smoker  . Smokeless tobacco: Never Used  Substance Use Topics  . Alcohol use: No    Alcohol/week: 0.0 standard drinks  . Drug use: No     Family Hx: The patient's family history includes Heart attack in her sister; Hypertension in an other family member.  ROS:   Please see the history of present illness.    *** All other systems reviewed and are negative.   Prior CV studies:   The following studies were reviewed today:  03/19/14 [facility] LVEF 60-65%, abnormal diastolic dysfunction/grade not reported,   Cath 2012 HEMODYNAMIC DATA:  1. Central aortic pressure 154/67, mean 103.  2. LV pressure 166/19.  3. There was no gradient or pullback across aortic valve.  ANGIOGRAPHIC DATA:  1. Ventriculography was not performed. We have multiple sources of  ventricular function data.  2. The left main is large in caliber and free of critical disease.  3. The left anterior descending artery is fairly heavily calcified.  There are multiple 30% areas of narrowing throughout the mid and  distal vessel and has diffuse luminal irregularities all throughout  the mid vessel up to about 50% in the distal LAD, however, it does  appear to be smooth and somewhat diffusely plaqued. The apical LAD  goes to the apex. Prior to this, there are 2 diagonal branches.  The diagonal branches have some mild luminal irregularity, but did  not demonstrate critical disease.  4. The circumflex is a large-caliber vessel. There  is an AV  circumflex that supplies 3 posterolateral branches with mild  luminal irregularity, but no critical stenoses. There is a very  large bifurcating marginal. The marginal distally goes out over  the apical tip. It is large in caliber and without critical  narrowing.  5. The right coronary artery is also somewhat calcified. The right  coronary artery has about 30-40% narrowing proximally and about 30-  40% narrowing distally  prior to the PDA and PLA. The PDA and PLA  are widely patent, however.  CONCLUSIONS:  1. Nonobstructive coronary artery disease.  2. Chest pain.  DISPOSITION: The patient will be discharged in the morning with  followup with her primary care physician and Cardiology.  03/2014 Lexiscan MPI IMPRESSION:  Lexiscan sestamibi: Electrically negative for ischemia Sestamibi  scan with evidence of normal perfusion, minimal soft tissue  attenuation. LVEF on gating calculated at greater than 80% with  normal wall motion.   Labs/Other Tests and Data Reviewed:    EKG:  {EKG/Telemetry Strips Reviewed:[phone]}  Recent Labs: 03/18/2019: B Natriuretic Peptide 50.0 08/12/2019: ALT 147; BUN 13; Creat 0.56; Hemoglobin 13.1; Platelets 251; Potassium 4.3; Sodium 141   Recent Lipid Panel No results found for: CHOL, TRIG, HDL, CHOLHDL, LDLCALC, LDLDIRECT  Wt Readings from Last 3 Encounters:  08/12/19 205 lb 9.6 oz (93.3 kg)  04/08/19 198 lb 9.6 oz (90.1 kg)  03/18/19 200 lb (90.7 kg)     Objective:    Vital Signs:  There were no vitals taken for this visit.   {[facility] Virtual Exam (Optional):[phone]::"VITAL SIGNS:  reviewed"}  ASSESSMENT & PLAN:    1. Chest pain - long history of chest pain with negative evaluations including negative cath in 2012, recent negative Lexiscan MPI - recent symptoms noncardiac, no additional testing is required at this time  2. HTN - at goal, continue current meds.   3. Carotid bruit - sounds like pcp obtained carotid US recently, we will request results  F/u 6 months  COVID-19 Education: The signs and symptoms of COVID-19 were discussed with the patient and how to seek care for testing (follow up with PCP or arrange E-visit).  ***The importance of social distancing was discussed today.  Time:   Today, I have spent *** minutes with the patient with telehealth technology discussing the above problems.     Medication Adjustments/Labs  and Tests Ordered: Current medicines are reviewed at length with the patient today.  Concerns regarding medicines are outlined above.   Tests Ordered: No orders of the defined types were placed in this encounter.   Medication Changes: No orders of the defined types were placed in this encounter.   Follow Up:  {F/U Format:[id number]} {follow up:15908}  Signed, Carlyle Dolly, MD  09/11/2019 12:20 PM    [facility]

## 2019-09-12 DIAGNOSIS — I10 Essential (primary) hypertension: Secondary | ICD-10-CM | POA: Diagnosis not present

## 2019-10-03 DIAGNOSIS — F419 Anxiety disorder, unspecified: Secondary | ICD-10-CM | POA: Diagnosis not present

## 2019-10-03 DIAGNOSIS — Z299 Encounter for prophylactic measures, unspecified: Secondary | ICD-10-CM | POA: Diagnosis not present

## 2019-10-03 DIAGNOSIS — F321 Major depressive disorder, single episode, moderate: Secondary | ICD-10-CM | POA: Diagnosis not present

## 2019-10-03 DIAGNOSIS — K582 Mixed irritable bowel syndrome: Secondary | ICD-10-CM | POA: Diagnosis not present

## 2019-10-03 DIAGNOSIS — I10 Essential (primary) hypertension: Secondary | ICD-10-CM | POA: Diagnosis not present

## 2019-10-08 ENCOUNTER — Ambulatory Visit (INDEPENDENT_AMBULATORY_CARE_PROVIDER_SITE_OTHER): Payer: PPO | Admitting: Internal Medicine

## 2019-10-10 DIAGNOSIS — I10 Essential (primary) hypertension: Secondary | ICD-10-CM | POA: Diagnosis not present

## 2019-10-15 ENCOUNTER — Ambulatory Visit (INDEPENDENT_AMBULATORY_CARE_PROVIDER_SITE_OTHER): Payer: PPO | Admitting: Internal Medicine

## 2019-10-21 DIAGNOSIS — C44311 Basal cell carcinoma of skin of nose: Secondary | ICD-10-CM | POA: Diagnosis not present

## 2019-10-21 DIAGNOSIS — L820 Inflamed seborrheic keratosis: Secondary | ICD-10-CM | POA: Diagnosis not present

## 2019-10-21 DIAGNOSIS — L821 Other seborrheic keratosis: Secondary | ICD-10-CM | POA: Diagnosis not present

## 2019-10-21 DIAGNOSIS — C44319 Basal cell carcinoma of skin of other parts of face: Secondary | ICD-10-CM | POA: Diagnosis not present

## 2019-10-22 DIAGNOSIS — R5383 Other fatigue: Secondary | ICD-10-CM | POA: Diagnosis not present

## 2019-10-22 DIAGNOSIS — Z7189 Other specified counseling: Secondary | ICD-10-CM | POA: Diagnosis not present

## 2019-10-22 DIAGNOSIS — E119 Type 2 diabetes mellitus without complications: Secondary | ICD-10-CM | POA: Diagnosis not present

## 2019-10-22 DIAGNOSIS — E7800 Pure hypercholesterolemia, unspecified: Secondary | ICD-10-CM | POA: Diagnosis not present

## 2019-10-22 DIAGNOSIS — Z1211 Encounter for screening for malignant neoplasm of colon: Secondary | ICD-10-CM | POA: Diagnosis not present

## 2019-10-22 DIAGNOSIS — Z1339 Encounter for screening examination for other mental health and behavioral disorders: Secondary | ICD-10-CM | POA: Diagnosis not present

## 2019-10-22 DIAGNOSIS — E039 Hypothyroidism, unspecified: Secondary | ICD-10-CM | POA: Diagnosis not present

## 2019-10-22 DIAGNOSIS — Z0000 Encounter for general adult medical examination without abnormal findings: Secondary | ICD-10-CM | POA: Diagnosis not present

## 2019-10-22 DIAGNOSIS — Z299 Encounter for prophylactic measures, unspecified: Secondary | ICD-10-CM | POA: Diagnosis not present

## 2019-10-22 DIAGNOSIS — Z6834 Body mass index (BMI) 34.0-34.9, adult: Secondary | ICD-10-CM | POA: Diagnosis not present

## 2019-10-22 DIAGNOSIS — Z1331 Encounter for screening for depression: Secondary | ICD-10-CM | POA: Diagnosis not present

## 2019-10-22 DIAGNOSIS — I10 Essential (primary) hypertension: Secondary | ICD-10-CM | POA: Diagnosis not present

## 2019-11-01 DIAGNOSIS — M159 Polyosteoarthritis, unspecified: Secondary | ICD-10-CM | POA: Diagnosis not present

## 2019-11-01 DIAGNOSIS — I10 Essential (primary) hypertension: Secondary | ICD-10-CM | POA: Diagnosis not present

## 2019-11-11 ENCOUNTER — Ambulatory Visit (INDEPENDENT_AMBULATORY_CARE_PROVIDER_SITE_OTHER): Payer: PPO | Admitting: Internal Medicine

## 2019-11-11 ENCOUNTER — Encounter (INDEPENDENT_AMBULATORY_CARE_PROVIDER_SITE_OTHER): Payer: Self-pay | Admitting: *Deleted

## 2019-11-11 ENCOUNTER — Encounter (INDEPENDENT_AMBULATORY_CARE_PROVIDER_SITE_OTHER): Payer: Self-pay | Admitting: Internal Medicine

## 2019-11-11 VITALS — BP 154/72 | HR 80 | Temp 96.6°F | Ht 67.0 in | Wt 200.5 lb

## 2019-11-11 DIAGNOSIS — K760 Fatty (change of) liver, not elsewhere classified: Secondary | ICD-10-CM

## 2019-11-11 DIAGNOSIS — R7989 Other specified abnormal findings of blood chemistry: Secondary | ICD-10-CM | POA: Diagnosis not present

## 2019-11-11 DIAGNOSIS — K5900 Constipation, unspecified: Secondary | ICD-10-CM | POA: Diagnosis not present

## 2019-11-11 DIAGNOSIS — R630 Anorexia: Secondary | ICD-10-CM | POA: Diagnosis not present

## 2019-11-11 NOTE — Progress Notes (Signed)
Presenting complaint;  Follow-up for elevated transaminases. Patient complains of poor appetite and food not digesting.  Database and subjective:  Patient is 86 year old Caucasian female who has history of elevated transaminases since April 2018.  Ultrasound suggested fatty liver.  Other markers were negative.  Transaminases been higher than what would expect with fatty liver.  She was given trial with prednisone with a thought that she may have autoimmune hepatitis.  She did not tolerate prednisone which was stopped few days later back in June 2020.  So we just have been monitoring her.  She was last seen 3 months ago.  She remains with multiple complaints.  She says she has very poor appetite.  She is surprised that she is only lost 5 pounds since her last visit.  She says all she had today was to crackers and a glass of water.  She did not have lunch.  Her supper yesterday was a vegetable beef soup.  Was canned soup.  She also had few crackers and water. She says she does not have any swallowing difficulty but her food sits in her stomach for a long time then eventually moved to the left side.  She has not had nausea or vomiting.  She feels bloated.  She complains of shortness of breath with minimal exertion.  She also has been having intermittent chest pain.  She says she had an episode last night when her exdaughter-in-law who is a nurse told her to take nitroglycerin. Patient states that she has gained 60 pounds since she retired from [employer] 4 years ago.  She says she is not able to do much walking because of bilateral knee pain and shortness of breath. She also complains of intermittent headache and she had an episode last night and was not able to sleep.  She had taken nitroglycerin last evening. Patient was seen by Ms. Angela boone PA-C 2 weeks ago for yearly physical exam.  She is supposed to have blood work in near future.  Current Medications: Outpatient Encounter Medications as of  11/11/2019  Medication Sig  . albuterol (VENTOLIN HFA) 108 (90 Base) MCG/ACT inhaler Inhale 1 puff into the lungs 4 (four) times daily.   Marland Kitchen ALPRAZolam (XANAX) 1 MG tablet Take 1 mg by mouth 2 (two) times daily.   Marland Kitchen amLODipine (NORVASC) 10 MG tablet Take 10 mg by mouth every morning.   . Ascorbic Acid (VITAMIN C WITH ROSE HIPS) 500 MG tablet Take 500 mg by mouth daily.  Marland Kitchen aspirin 81 MG tablet Take 81 mg by mouth once as needed for pain.   . bisacodyl (DULCOLAX) 10 MG suppository Place 1 suppository (10 mg total) rectally daily as needed for moderate constipation.  . Calcium Carb-Cholecalciferol (CALCIUM 600+D3 PO) Take 600 Units by mouth daily.   . fluorometholone (FML) 0.1 % ophthalmic suspension Place 1 drop into both eyes 2 (two) times daily.   . Lactobacillus (FLORAJEN ACIDOPHILUS PO) Take by mouth daily.  Marland Kitchen linaclotide (LINZESS) 290 MCG CAPS capsule Take 290 mcg by mouth daily before breakfast.  . metoprolol tartrate (LOPRESSOR) 25 MG tablet Take 25 mg by mouth 2 (two) times daily.  . nitroGLYCERIN (NITROSTAT) 0.4 MG SL tablet Place 0.4 mg under the tongue every 5 (five) minutes as needed for chest pain.  Marland Kitchen nystatin-triamcinolone (MYCOLOG II) cream Apply 1 application topically 2 (two) times daily. Apply to area of his skin rash as directed.  Marland Kitchen omeprazole (PRILOSEC) 40 MG capsule Take 40 mg by mouth daily.  Marland Kitchen  polyethylene glycol (MIRALAX / GLYCOLAX) packet Take 17 g by mouth daily as needed for mild constipation or moderate constipation.   Marland Kitchen VITAMIN D PO Take 1 capsule by mouth daily.   . [DISCONTINUED] ciprofloxacin (CIPRO) 250 MG tablet Take 1 tablet (250 mg total) by mouth 2 (two) times daily. (Patient not taking: Reported on 11/11/2019)   No facility-administered encounter medications on file as of 11/11/2019.     Objective: Blood pressure (!) 154/72, pulse 80, temperature (!) 96.6 F (35.9 C), temperature source Temporal, height '5\' 7"'  (1.702 m), weight 200 lb 8 oz (90.9 kg). Patient  is alert and in no acute distress. She is wearing facial mask. She is using walker to ambulate. Conjunctiva is pink. Sclera is nonicteric Oropharyngeal mucosa is normal. No neck masses or thyromegaly noted. Cardiac exam with regular rhythm normal S1 and S2. No murmur or gallop noted. Lungs are clear to auscultation. Abdomen is protuberant.  She has small umbilical hernia which is completely reducible.  Abdomen is soft and nontender.  Liver edge is indistinct.  Liver does not appear to be large.  Percussion note is normal. No LE edema or clubbing noted.  Labs/studies Results:  CBC Latest Ref Rng & Units 08/12/2019 03/18/2019 06/08/2018  WBC 3.8 - 10.8 Thousand/uL 9.6 6.8 7.2  Hemoglobin 11.7 - 15.5 g/dL 13.1 11.4(L) 11.8(L)  Hematocrit 35.0 - 45.0 % 40.7 37.2 37.4  Platelets 140 - 400 Thousand/uL 251 215 236    CMP Latest Ref Rng & Units 08/12/2019 03/18/2019 12/07/2018  Glucose 65 - 139 mg/dL 78 123(H) -  BUN 7 - 25 mg/dL 13 10 -  Creatinine 0.60 - 0.88 mg/dL 0.56(L) 0.64 -  Sodium 135 - 146 mmol/L 141 139 -  Potassium 3.5 - 5.3 mmol/L 4.3 3.5 -  Chloride 98 - 110 mmol/L 103 107 -  CO2 20 - 32 mmol/L 24 22 -  Calcium 8.6 - 10.4 mg/dL 9.9 8.9 -  Total Protein 6.1 - 8.1 g/dL 7.2 6.6 7.2  Total Bilirubin 0.2 - 1.2 mg/dL 0.4 0.4 0.5  Alkaline Phos 38 - 126 U/L - 76 -  AST 10 - 35 U/L 185(H) 115(H) 87(H)  ALT 6 - 29 U/L 147(H) 80(H) 71(H)    Hepatic Function Latest Ref Rng & Units 08/12/2019 03/18/2019 12/07/2018  Total Protein 6.1 - 8.1 g/dL 7.2 6.6 7.2  Albumin 3.5 - 5.0 g/dL - 3.5 -  AST 10 - 35 U/L 185(H) 115(H) 87(H)  ALT 6 - 29 U/L 147(H) 80(H) 71(H)  Alk Phosphatase 38 - 126 U/L - 76 -  Total Bilirubin 0.2 - 1.2 mg/dL 0.4 0.4 0.5  Bilirubin, Direct 0.0 - 0.2 mg/dL - - 0.1    Patient had ultrasound on 09/26/2018 which revealed fatty liver and normal-sized spleen. She had abdominal CT on 05/30/2018 revealing echogenic liver but no splenomegaly or other abnormalities.  She had  evidence of prior T12 vertebroplasty and benign hemangioma involving L4.  Assessment:  #1.  Elevated transaminases of almost 3 years duration.  Etiology felt to be fatty liver but transaminases are higher than usually expected in this condition.  Unfortunately she has not been able to exercise or lose weight which would have reinforced this diagnosis if transaminases have improved.  Trial with prednisone terminated because of side effects.  She remains without stigmata of portal hypertension.  #2.  Dyspepsia.  She remains with complaint that food is not digesting and it stays in her stomach for long time.  However she is  not having any nausea or vomiting.  She could have cholelithiasis or gastroparesis.  #3.  Anorexia.  She has very poor appetite and intake appears to be very small but she is not losing weight.  She only has lost 5 pounds in 3 months.  Need to rule out thyroid disease.  Plan:  Patient will go to the lab for LFTs and TSH.  She may want to wait until she hears from Ms. Arsenio Katz, PAC so that she can have all the blood work in the same time. Abdominal ultrasound. Office visit in 3 months.

## 2019-11-11 NOTE — Patient Instructions (Signed)
Physician will call with results of blood tests and ultrasound when completed 

## 2019-11-14 DIAGNOSIS — E7800 Pure hypercholesterolemia, unspecified: Secondary | ICD-10-CM | POA: Diagnosis not present

## 2019-11-14 DIAGNOSIS — R5383 Other fatigue: Secondary | ICD-10-CM | POA: Diagnosis not present

## 2019-11-14 DIAGNOSIS — Z79899 Other long term (current) drug therapy: Secondary | ICD-10-CM | POA: Diagnosis not present

## 2019-11-14 DIAGNOSIS — E039 Hypothyroidism, unspecified: Secondary | ICD-10-CM | POA: Diagnosis not present

## 2019-11-15 DIAGNOSIS — R7401 Elevation of levels of liver transaminase levels: Secondary | ICD-10-CM | POA: Diagnosis not present

## 2019-11-15 DIAGNOSIS — K760 Fatty (change of) liver, not elsewhere classified: Secondary | ICD-10-CM | POA: Diagnosis not present

## 2019-11-15 DIAGNOSIS — N281 Cyst of kidney, acquired: Secondary | ICD-10-CM | POA: Diagnosis not present

## 2019-11-18 DIAGNOSIS — Z85828 Personal history of other malignant neoplasm of skin: Secondary | ICD-10-CM | POA: Diagnosis not present

## 2019-11-18 DIAGNOSIS — Z08 Encounter for follow-up examination after completed treatment for malignant neoplasm: Secondary | ICD-10-CM | POA: Diagnosis not present

## 2019-11-18 DIAGNOSIS — L820 Inflamed seborrheic keratosis: Secondary | ICD-10-CM | POA: Diagnosis not present

## 2019-11-18 DIAGNOSIS — C44311 Basal cell carcinoma of skin of nose: Secondary | ICD-10-CM | POA: Diagnosis not present

## 2019-11-20 DIAGNOSIS — Z6833 Body mass index (BMI) 33.0-33.9, adult: Secondary | ICD-10-CM | POA: Diagnosis not present

## 2019-11-20 DIAGNOSIS — I10 Essential (primary) hypertension: Secondary | ICD-10-CM | POA: Diagnosis not present

## 2019-11-20 DIAGNOSIS — R0789 Other chest pain: Secondary | ICD-10-CM | POA: Diagnosis not present

## 2019-11-20 DIAGNOSIS — K2900 Acute gastritis without bleeding: Secondary | ICD-10-CM | POA: Diagnosis not present

## 2019-11-20 DIAGNOSIS — Z299 Encounter for prophylactic measures, unspecified: Secondary | ICD-10-CM | POA: Diagnosis not present

## 2019-11-20 DIAGNOSIS — F419 Anxiety disorder, unspecified: Secondary | ICD-10-CM | POA: Diagnosis not present

## 2019-11-20 DIAGNOSIS — E7800 Pure hypercholesterolemia, unspecified: Secondary | ICD-10-CM | POA: Diagnosis not present

## 2019-11-21 ENCOUNTER — Ambulatory Visit: Payer: PPO | Admitting: Nutrition

## 2019-11-21 DIAGNOSIS — I10 Essential (primary) hypertension: Secondary | ICD-10-CM | POA: Diagnosis not present

## 2019-12-05 DIAGNOSIS — I10 Essential (primary) hypertension: Secondary | ICD-10-CM | POA: Diagnosis not present

## 2019-12-05 DIAGNOSIS — M159 Polyosteoarthritis, unspecified: Secondary | ICD-10-CM | POA: Diagnosis not present

## 2019-12-18 ENCOUNTER — Telehealth: Payer: Self-pay | Admitting: Cardiology

## 2019-12-18 NOTE — Telephone Encounter (Signed)

## 2019-12-22 DIAGNOSIS — I10 Essential (primary) hypertension: Secondary | ICD-10-CM | POA: Diagnosis not present

## 2019-12-24 ENCOUNTER — Telehealth (INDEPENDENT_AMBULATORY_CARE_PROVIDER_SITE_OTHER): Payer: PPO | Admitting: Cardiology

## 2019-12-24 ENCOUNTER — Encounter: Payer: Self-pay | Admitting: Cardiology

## 2019-12-24 VITALS — BP 148/79 | HR 78 | Ht 67.0 in | Wt 195.0 lb

## 2019-12-24 DIAGNOSIS — R0602 Shortness of breath: Secondary | ICD-10-CM | POA: Diagnosis not present

## 2019-12-24 DIAGNOSIS — R0789 Other chest pain: Secondary | ICD-10-CM

## 2019-12-24 MED ORDER — FUROSEMIDE 40 MG PO TABS: 40.0000 mg | ORAL_TABLET | ORAL | 3 refills | Status: DC | PRN

## 2019-12-24 NOTE — Progress Notes (Signed)
Virtual Visit via Telephone Note   This visit type was conducted due to national recommendations for restrictions regarding the COVID-19 Pandemic (e.g. social distancing) in an effort to limit this patient's exposure and mitigate transmission in our community.  Due to her co-morbid illnesses, this patient is at least at moderate risk for complications without adequate follow up.  This format is felt to be most appropriate for this patient at this time.  The patient did not have access to video technology/had technical difficulties with video requiring transitioning to audio format only (telephone).  All issues noted in this document were discussed and addressed.  No physical exam could be performed with this format.  Please refer to the patient's chart for her  consent to telehealth for [facility].   Date:  12/24/2019   ID:  Celeta Eicher, DOB 1931/06/30, MRN [id number]  Patient Location: Home Provider Location: Office  PCP:  Glenda Chroman, MD  Cardiologist:  Dr Carlyle Dolly MD Electrophysiologist:  None   Evaluation Performed:  Follow-Up Visit  Chief Complaint:  Follow up visit  History of Present Illness:    Heather [name] is a 86 y.o. female seen today for follow up of the following medical problems.   1. Chest pain  - history of chest pain, prior cath in 2012 with non-obstructive disease.  - Lexiscan MPI 03/2014 without ischemia, normal LVEF   - ER admit 05/2018 with chest pain. Described as atypical, reproducible with palpation.  - trop neg x 3 - chronic soreness in chest unchanged.   - sorenss under left breast, mild. Often comes on with eating. No exertional symptoms   2. SOB - chronic SOB, for example walking to bathroom from her room. Chronic symptoms somewhat progressed.  - can have some LE edema. She takes a fluid pill as needed, takes chlorthalidone 25mg  prn.   3. HTN  - compliant with meds  4. Elevated LFTs - followed by GI, chronic.  Thought to be due to fatty liver.    5. Dyspepsia -followed by GI    - husband passed few years ago He passed at home under home hospice due to CHF, kidney failure, and severe COPD.   Got first covid shot, awaiting second dose.    The patient does not have symptoms concerning for COVID-19 infection (fever, chills, cough, or new shortness of breath).    Past Medical History:  Diagnosis Date  . Anxiety   . GERD (gastroesophageal reflux disease)   . Hx of shoulder surgery    Left Shoulder - pitched nerve.  . Other and unspecified hyperlipidemia   . Unspecified essential hypertension    Past Surgical History:  Procedure Laterality Date  . CERVICAL DISC SURGERY    . CESAREAN SECTION  1970  . History of shoulder surgery Left    pitched nerve.     No outpatient medications have been marked as taking for the 12/24/19 encounter (Appointment) with Arnoldo Lenis, MD.     Allergies:   Patient has no known allergies.   Social History   Tobacco Use  . Smoking status: Never Smoker  . Smokeless tobacco: Never Used  Substance Use Topics  . Alcohol use: No    Alcohol/week: 0.0 standard drinks  . Drug use: No     Family Hx: The patient's family history includes Heart attack in her sister; Hypertension in an other family member.  ROS:   Please see the history of present illness.    All other  systems reviewed and are negative.   Prior CV studies:   The following studies were reviewed today:  03/19/14 Echo [facility] LVEF 60-65%, abnormal diastolic dysfunction/grade not reported,   Cath 2012 HEMODYNAMIC DATA:  1. Central aortic pressure 154/67, mean 103.  2. LV pressure 166/19.  3. There was no gradient or pullback across aortic valve.  ANGIOGRAPHIC DATA:  1. Ventriculography was not performed. We have multiple sources of  ventricular function data.  2. The left main is large in caliber and free of critical disease.  3. The left anterior descending  artery is fairly heavily calcified.  There are multiple 30% areas of narrowing throughout the mid and  distal vessel and has diffuse luminal irregularities all throughout  the mid vessel up to about 50% in the distal LAD, however, it does  appear to be smooth and somewhat diffusely plaqued. The apical LAD  goes to the apex. Prior to this, there are 2 diagonal branches.  The diagonal branches have some mild luminal irregularity, but did  not demonstrate critical disease.  4. The circumflex is a large-caliber vessel. There is an AV  circumflex that supplies 3 posterolateral branches with mild  luminal irregularity, but no critical stenoses. There is a very  large bifurcating marginal. The marginal distally goes out over  the apical tip. It is large in caliber and without critical  narrowing.  5. The right coronary artery is also somewhat calcified. The right  coronary artery has about 30-40% narrowing proximally and about 30-  40% narrowing distally prior to the PDA and PLA. The PDA and PLA  are widely patent, however.  CONCLUSIONS:  1. Nonobstructive coronary artery disease.  2. Chest pain.  DISPOSITION: The patient will be discharged in the morning with  followup with her primary care physician and Cardiology.  03/2014 Lexiscan MPI IMPRESSION:  Lexiscan sestamibi: Electrically negative for ischemia Sestamibi  scan with evidence of normal perfusion, minimal soft tissue  attenuation. LVEF on gating calculated at greater than 80% with  normal wall motion. Labs/Other Tests and Data Reviewed:    EKG:  No ECG reviewed.  Recent Labs: 03/18/2019: B Natriuretic Peptide 50.0 08/12/2019: ALT 147; BUN 13; Creat 0.56; Hemoglobin 13.1; Platelets 251; Potassium 4.3; Sodium 141   Recent Lipid Panel No results found for: CHOL, TRIG, HDL, CHOLHDL, LDLCALC, LDLDIRECT  Wt Readings from Last 3 Encounters:  11/11/19 200 lb 8 oz (90.9 kg)  08/12/19 205 lb 9.6 oz (93.3 kg)   04/08/19 198 lb 9.6 oz (90.1 kg)     Objective:    Vital Signs:   Today's Vitals   12/24/19 1141  BP: (!) 148/79  Pulse: 78  Weight: 195 lb (88.5 kg)  Height: 5\' 7"  (1.702 m)   Body mass index is 30.54 kg/m. Normal affect. Normal speech pattern and tone. Comfortable, no apparent distress. No audible signs of SOB or wheezing.   ASSESSMENT & PLAN:    1. Chest pain - long history of chest pain with negative evaluations including negative cath in 2012, 2015negative Lexiscan MPI - no recent cardiac symptoms, ongoing dyspepsia followed by GI  2. SOB - reports progressing SOB, some increased LE edema. She is taking chlorthalidone 25mg  prn - will order echo, d/c chlorthalidone and change to prn lasix 40mg     F/u 6 months  COVID-19 Education: The signs and symptoms of COVID-19 were discussed with the patient and how to seek care for testing (follow up with PCP or arrange E-visit).  The importance of  social distancing was discussed today.  Time:   Today, I have spent 22 minutes with the patient with telehealth technology discussing the above problems.     Medication Adjustments/Labs and Tests Ordered: Current medicines are reviewed at length with the patient today.  Concerns regarding medicines are outlined above.   Tests Ordered: No orders of the defined types were placed in this encounter.   Medication Changes: No orders of the defined types were placed in this encounter.   Follow Up:  In Person in 3 week(s)  Signed, Carlyle Dolly, MD  12/24/2019 11:41 AM    [place]

## 2019-12-24 NOTE — Addendum Note (Signed)
Addended by: Debbora Lacrosse R on: 12/24/2019 03:04 PM   Modules accepted: Orders

## 2019-12-24 NOTE — Patient Instructions (Addendum)
Medication Instructions:  Stop Chlorthalidone  Start lasix 40 mg daily AS NEEDED   Labwork: none  Testing/Procedures: Your physician has requested that you have an echocardiogram. Echocardiography is a painless test that uses sound waves to create images of your heart. It provides your doctor with information about the size and shape of your heart and how well your heart's chambers and valves are working. This procedure takes approximately one hour. There are no restrictions for this procedure.    Follow-Up: Your physician recommends that you schedule a follow-up appointment in: 3 weeks    Any Other Special Instructions Will Be Listed Below (If Applicable).     If you need a refill on your cardiac medications before your next appointment, please call your pharmacy.

## 2019-12-27 ENCOUNTER — Ambulatory Visit (HOSPITAL_COMMUNITY)
Admission: RE | Admit: 2019-12-27 | Discharge: 2019-12-27 | Disposition: A | Discharge status: Home / Self Care | Payer: PPO | Source: Ambulatory Visit | Attending: Cardiology | Admitting: Cardiology

## 2019-12-27 ENCOUNTER — Other Ambulatory Visit: Payer: Self-pay

## 2019-12-27 DIAGNOSIS — R0602 Shortness of breath: Secondary | ICD-10-CM | POA: Diagnosis not present

## 2019-12-27 NOTE — Progress Notes (Signed)
*  PRELIMINARY RESULTS* Echocardiogram 2D Echocardiogram has been performed.  Heather [name] 12/27/2019, 12:22 PM

## 2020-01-08 DIAGNOSIS — Z79899 Other long term (current) drug therapy: Secondary | ICD-10-CM | POA: Diagnosis not present

## 2020-01-08 DIAGNOSIS — J449 Chronic obstructive pulmonary disease, unspecified: Secondary | ICD-10-CM | POA: Diagnosis not present

## 2020-01-08 DIAGNOSIS — R0602 Shortness of breath: Secondary | ICD-10-CM | POA: Diagnosis not present

## 2020-01-08 DIAGNOSIS — I10 Essential (primary) hypertension: Secondary | ICD-10-CM | POA: Diagnosis not present

## 2020-01-08 DIAGNOSIS — R079 Chest pain, unspecified: Secondary | ICD-10-CM | POA: Diagnosis not present

## 2020-01-13 DIAGNOSIS — M159 Polyosteoarthritis, unspecified: Secondary | ICD-10-CM | POA: Diagnosis not present

## 2020-01-13 DIAGNOSIS — I10 Essential (primary) hypertension: Secondary | ICD-10-CM | POA: Diagnosis not present

## 2020-01-14 ENCOUNTER — Encounter: Payer: Self-pay | Admitting: Cardiology

## 2020-01-14 ENCOUNTER — Ambulatory Visit (INDEPENDENT_AMBULATORY_CARE_PROVIDER_SITE_OTHER): Payer: PPO | Admitting: Cardiology

## 2020-01-14 ENCOUNTER — Encounter: Payer: Self-pay | Admitting: *Deleted

## 2020-01-14 VITALS — BP 160/70 | HR 81 | Temp 97.2°F | Ht 67.0 in | Wt 198.0 lb

## 2020-01-14 DIAGNOSIS — R0789 Other chest pain: Secondary | ICD-10-CM | POA: Diagnosis not present

## 2020-01-14 DIAGNOSIS — I2510 Atherosclerotic heart disease of native coronary artery without angina pectoris: Secondary | ICD-10-CM | POA: Diagnosis not present

## 2020-01-14 DIAGNOSIS — I10 Essential (primary) hypertension: Secondary | ICD-10-CM | POA: Diagnosis not present

## 2020-01-14 DIAGNOSIS — R0602 Shortness of breath: Secondary | ICD-10-CM | POA: Diagnosis not present

## 2020-01-14 MED ORDER — ISOSORBIDE MONONITRATE ER 30 MG PO TB24: 30.0000 mg | ORAL_TABLET | Freq: Every day | ORAL | 3 refills | Status: DC

## 2020-01-14 NOTE — Progress Notes (Signed)
Cardiology Office Note   Date:  01/14/2020   ID:  Keven, Shibley 11-05-1930, MRN [id number]  PCP:  Glenda Chroman, MD  Cardiologist:  Dr. Harl Bowie     Chief Complaint  Patient presents with  . Shortness of Breath  . Hypertension      History of Present Illness: Heather [name] is a 86 y.o. female who presents for follow up for SOB and post echo.    Since pt seen by Dr. Harl Bowie on 12/24/19 by telehealth- she felt so bad she went to [facility] on the 18th of March.  She had SOB that was significant.  She had what sounds like CT of chest and no clots.  She was given steroids and ABX, and breathing treatment, the treatment seemed to help.  She was shaking when she left.  Could be from nebulizer.  She has not felt much better since.   Her BP is elevated today and at home when this happens she takes a sl NTG and BP comes down, she also takes a xanax.  She has only taken lasix once or twice because she has to go to [place] so much.   She has soreness in chest with the high BP.  Her echo has normal EF and G1DD.  -  She had one night of Rt arm pain but no chest soreness at that time.   To see GI in near future.   1. Chest pain  - history of chest pain, prior cath in 2012 with non-obstructive disease.  - Lexiscan MPI 03/2014 without ischemia, normal LVEF   - ER admit 05/2018 with chest pain. Described as atypical, reproducible with palpation.  - trop neg x 3 - chronic soreness in chest unchanged.  - sorenss under left breast, mild. Often comes on with eating. No exertional symptoms same for today We discussed stress test vs trying meds first and she preferred meds.    2. SOB - chronic SOB, for example walking to bathroom from her room. Chronic symptoms somewhat progressed.  - can have some LE edema. She takes a fluid pill as needed, takes chlorthalidone 25mg  prn. Stopped and prn lasix added  Also echo with EF 65-70%  G1DD  --still having these episodes -her BP is  elevated today --in ER dx of COPD will obtain records from [facility].   3. HTN  - compliant with meds but still elevated.   4. Elevated LFTs - followed by GI, chronic. Thought to be due to fatty liver.    5. Dyspepsia -followed by GI    - husband passedfew years agoHe passed at home under home hospice due to CHF, kidney failure, and severe COPD.   Got first covid shot, awaiting second dose.   Past Medical History:  Diagnosis Date  . Anxiety   . GERD (gastroesophageal reflux disease)   . Hx of shoulder surgery    Left Shoulder - pitched nerve.  . Other and unspecified hyperlipidemia   . Unspecified essential hypertension     Past Surgical History:  Procedure Laterality Date  . CERVICAL DISC SURGERY    . CESAREAN SECTION  1970  . History of shoulder surgery Left    pitched nerve.     Current Outpatient Medications  Medication Sig Dispense Refill  . albuterol (VENTOLIN HFA) 108 (90 Base) MCG/ACT inhaler Inhale 1 puff into the lungs 4 (four) times daily.     Marland Kitchen ALPRAZolam (XANAX) 1 MG tablet Take 1 mg by mouth 2 (  two) times daily.     Marland Kitchen amLODipine (NORVASC) 10 MG tablet Take 10 mg by mouth every morning.   12  . Ascorbic Acid (VITAMIN C WITH ROSE HIPS) 500 MG tablet Take 500 mg by mouth daily.    Marland Kitchen aspirin 81 MG tablet Take 81 mg by mouth once as needed for pain.     . bisacodyl (DULCOLAX) 10 MG suppository Place 1 suppository (10 mg total) rectally daily as needed for moderate constipation. 12 suppository 0  . Calcium Carb-Cholecalciferol (CALCIUM 600+D3 PO) Take 600 Units by mouth daily.     . fluorometholone (FML) 0.1 % ophthalmic suspension Place 1 drop into both eyes 2 (two) times daily.   1  . furosemide (LASIX) 40 MG tablet Take 1 tablet (40 mg total) by mouth as needed. 90 tablet 3  . Lactobacillus (FLORAJEN ACIDOPHILUS PO) Take by mouth daily.    Marland Kitchen linaclotide (LINZESS) 290 MCG CAPS capsule Take 290 mcg by mouth daily before breakfast.      . metoprolol tartrate (LOPRESSOR) 25 MG tablet Take 25 mg by mouth 2 (two) times daily.  3  . nitroGLYCERIN (NITROSTAT) 0.4 MG SL tablet Place 0.4 mg under the tongue every 5 (five) minutes as needed for chest pain.    Marland Kitchen nystatin-triamcinolone (MYCOLOG II) cream Apply 1 application topically 2 (two) times daily. Apply to area of his skin rash as directed. 60 g 1  . omeprazole (PRILOSEC) 40 MG capsule Take 40 mg by mouth daily.    . polyethylene glycol (MIRALAX / GLYCOLAX) packet Take 17 g by mouth daily as needed for mild constipation or moderate constipation.  14 each   . VITAMIN D PO Take 1 capsule by mouth daily.      No current facility-administered medications for this visit.    Allergies:   Patient has no known allergies.    Social History:  The patient  reports that she has never smoked. She has never used smokeless tobacco. She reports that she does not drink alcohol or use drugs.   Family History:  The patient's family history includes Heart attack in her sister; Hypertension in an other family member.    ROS:  General:no colds or fevers, no weight changes Skin:no rashes or ulcers HEENT:+ blurred vision at times, no congestion CV:see HPI PUL:see HPI GI:no diarrhea ( one day she did have) no constipation or melena, no indigestion GU:no hematuria, no dysuria MS:no joint pain, no claudication Neuro:no syncope, no lightheadedness Endo:no diabetes, no thyroid disease  Wt Readings from Last 3 Encounters:  01/14/20 198 lb (89.8 kg)  12/24/19 195 lb (88.5 kg)  11/11/19 200 lb 8 oz (90.9 kg)     PHYSICAL EXAM: VS:  BP (!) 160/70   Pulse 81   Temp (!) 97.2 F (36.2 C)   Ht 5\' 7"  (1.702 m)   Wt 198 lb (89.8 kg)   SpO2 95%   BMI 31.01 kg/m  , BMI Body mass index is 31.01 kg/m. General:Pleasant affect, NAD Skin:Warm and dry, brisk capillary refill HEENT:normocephalic, sclera clear, mucus membranes moist Neck:supple, no JVD, soft rt carotid bruit  Heart:S1S2 RRR without  murmur, gallup, rub or click Lungs:clear without rales, rhonchi, or wheezes [id number], non tender, + BS, do not palpate liver spleen or masses Ext:no lower ext edema, 2+ pedal pulses, 2+ radial pulses Neuro:alert and oriented X 3, MAE, follows commands, + facial symmetry    EKG:  EKG is NOT  ordered today.    Recent Labs:  03/18/2019: B Natriuretic Peptide 50.0 08/12/2019: ALT 147; BUN 13; Creat 0.56; Hemoglobin 13.1; Platelets 251; Potassium 4.3; Sodium 141    Lipid Panel No results found for: CHOL, TRIG, HDL, CHOLHDL, VLDL, LDLCALC, LDLDIRECT     Other studies Reviewed: Additional studies/ records that were reviewed today include: .  03/19/14 Echo [facility] LVEF 60-65%, abnormal diastolic dysfunction/grade not reported,   Cath 2012 HEMODYNAMIC DATA:  1. Central aortic pressure 154/67, mean 103.  2. LV pressure 166/19.  3. There was no gradient or pullback across aortic valve.  ANGIOGRAPHIC DATA:  1. Ventriculography was not performed. We have multiple sources of  ventricular function data.  2. The left main is large in caliber and free of critical disease.  3. The left anterior descending artery is fairly heavily calcified.  There are multiple 30% areas of narrowing throughout the mid and  distal vessel and has diffuse luminal irregularities all throughout  the mid vessel up to about 50% in the distal LAD, however, it does  appear to be smooth and somewhat diffusely plaqued. The apical LAD  goes to the apex. Prior to this, there are 2 diagonal branches.  The diagonal branches have some mild luminal irregularity, but did  not demonstrate critical disease.  4. The circumflex is a large-caliber vessel. There is an AV  circumflex that supplies 3 posterolateral branches with mild  luminal irregularity, but no critical stenoses. There is a very  large bifurcating marginal. The marginal distally goes out over  the apical tip. It is large in caliber  and without critical  narrowing.  5. The right coronary artery is also somewhat calcified. The right  coronary artery has about 30-40% narrowing proximally and about 30-  40% narrowing distally prior to the PDA and PLA. The PDA and PLA  are widely patent, however.  CONCLUSIONS:  1. Nonobstructive coronary artery disease.  2. Chest pain.  DISPOSITION: The patient will be discharged in the morning with  followup with her primary care physician and Cardiology.  03/2014 Lexiscan MPI IMPRESSION:  Lexiscan sestamibi: Electrically negative for ischemia Sestamibi  scan with evidence of normal perfusion, minimal soft tissue  attenuation. LVEF on gating calculated at greater than 80% with  normal wall motion.  Echo 12/27/19  1. Left ventricular ejection fraction, by estimation, is 65 to 70%. The  left ventricle has hyperdynamic function. The left ventricle has no  regional wall motion abnormalities. There is mild left ventricular  hypertrophy. Left ventricular diastolic  parameters are consistent with Grade I diastolic dysfunction (impaired  relaxation).  2. Right ventricular systolic function is normal. The right ventricular  size is normal. There is mildly elevated pulmonary artery systolic  pressure.  3. The mitral valve is normal in structure and function. No evidence of  mitral valve regurgitation. No evidence of mitral stenosis.  4. The aortic valve is tricuspid. Aortic valve regurgitation is trivial.  No aortic stenosis is present.  5. The inferior vena cava is normal in size with greater than 50%  respiratory variability, suggesting right atrial pressure of 3 mmHg.   FINDINGS  Left Ventricle: Left ventricular ejection fraction, by estimation, is 65  to 70%. The left ventricle has hyperdynamic function. The left ventricle  has no regional wall motion abnormalities. The left ventricular internal  cavity size was normal in size.  There is mild left ventricular  hypertrophy. Left ventricular diastolic  parameters are consistent with Grade I diastolic dysfunction (impaired  relaxation). Normal left ventricular filling pressure.  Right Ventricle: The right ventricular size is normal. No increase in  right ventricular wall thickness. Right ventricular systolic function is  normal. There is mildly elevated pulmonary artery systolic pressure. The  tricuspid regurgitant velocity is 2.81  m/s, and with an assumed right atrial pressure of 3 mmHg, the estimated  right ventricular systolic pressure is [id number] mmHg.   Left Atrium: Left atrial size was normal in size.   Right Atrium: Right atrial size was normal in size.   Pericardium: There is no evidence of pericardial effusion.   Mitral Valve: The mitral valve is normal in structure and function. No  evidence of mitral valve regurgitation. No evidence of mitral valve  stenosis.   Tricuspid Valve: The tricuspid valve is normal in structure. Tricuspid  valve regurgitation is mild . No evidence of tricuspid stenosis.   Aortic Valve: The aortic valve is tricuspid. Aortic valve regurgitation is  trivial. No aortic stenosis is present. Aortic valve mean gradient  measures 6.2 mmHg. Aortic valve peak gradient measures 12.6 mmHg. Aortic  valve area, by VTI measures 2.54 cm.   Pulmonic Valve: The pulmonic valve was not well visualized. Pulmonic valve  regurgitation is mild. No evidence of pulmonic stenosis.   Aorta: The aortic root is normal in size and structure.   Pulmonary Artery: Mild pulmonary HTN, PASP is 34 mmHg.   Venous: The inferior vena cava is normal in size with greater than 50%  respiratory variability, suggesting right atrial pressure of 3 mmHg.   IAS/Shunts: No atrial level shunt detected by color flow Doppler.     Labs/Other Tests and Data Reviewed:    EKG:  No ECG reviewed.  Recent Labs: 03/18/2019: B Natriuretic Peptide 50.0 08/12/2019: ALT 147; BUN 13; Creat 0.56;  Hemoglobin 13.1; Platelets 251; Potassium 4.3; Sodium 141    ASSESSMENT AND PLAN:  1.  SOB increased with exertion, echo stable.  Her BP is elevated so will improve control to see if this will help.  Also seen at [facility] ER and several tests were done, will obtain those records.    2.  Chest soreness no acute changes.  In addition to SOB we discussed stress test, for now she would prefer medication so we will Imdur 30 mg daily to help with BP and soreness.  But if no improvement may need stress test or cath.  Will try conservative approach first.  Cath in 2012 with non obstructive disease and last nuc 2015 was neg.   3.  HTN elevated today.  Will add imdur 30 mg and have her take lasix 20 mg on M and Fridays.    4.  Carotid disease, present in 2019 but at 30 % or less bil.   5.  Blurred vision, she had her eyes checked last year and they were stable, she has had cataract surgery and laser surgery.    6.  GI issues to see them soon, difficulty with solid foods  Will see her back in 2 weeks to see if better, if not may need to pursue stress test. Hopefully with improved BP control she will be better.  Will also obtain records from [place].  She and her daughter were agreeable.      Current medicines are reviewed with the patient today.  The patient Has no concerns regarding medicines.  The following changes have been made:  See above Labs/ tests ordered today include:see above  Disposition:   FU:  see above  Signed, Heather Kicks, NP  01/14/2020  3:33 PM    [facility] [place], [place] [employer] [place], [place] Phone: [phone]; Fax: [phone]

## 2020-01-14 NOTE — Patient Instructions (Signed)
Medication Instructions:  Your physician has recommended you make the following change in your medication:  Lasix 20 mg Two times Weekly  Imdur 30 mg Daily   *If you need a refill on your cardiac medications before your next appointment, please call your pharmacy*   Lab Work: NONE  If you have labs (blood work) drawn today and your tests are completely normal, you will receive your results only by: Marland Kitchen MyChart Message (if you have MyChart) OR . A paper copy in the mail If you have any lab test that is abnormal or we need to change your treatment, we will call you to review the results.   Testing/Procedures: NONE    Follow-Up: At [facility], you and your health needs are our priority.  As part of our continuing mission to provide you with exceptional heart care, we have created designated Provider Care Teams.  These Care Teams include your primary Cardiologist (physician) and Advanced Practice Providers (APPs -  Physician Assistants and Nurse Practitioners) who all work together to provide you with the care you need, when you need it.  We recommend signing up for the patient portal called "MyChart".  Sign up information is provided on this After Visit Summary.  MyChart is used to connect with patients for Virtual Visits (Telemedicine).  Patients are able to view lab/test results, encounter notes, upcoming appointments, etc.  Non-urgent messages can be sent to your provider as well.   To learn more about what you can do with MyChart, go to [email or web address].    Your next appointment:   2 week(s)  The format for your next appointment:   In Person  Provider:   You may see No primary care provider on file. or one of the following Advanced Practice Providers on your designated Care Team:    Bernerd Pho, PA-C   Ermalinda Barrios, [place]     Other Instructions Thank you for choosing [place]!

## 2020-01-21 DIAGNOSIS — I10 Essential (primary) hypertension: Secondary | ICD-10-CM | POA: Diagnosis not present

## 2020-01-28 DIAGNOSIS — I10 Essential (primary) hypertension: Secondary | ICD-10-CM | POA: Diagnosis not present

## 2020-01-28 DIAGNOSIS — Z6833 Body mass index (BMI) 33.0-33.9, adult: Secondary | ICD-10-CM | POA: Diagnosis not present

## 2020-01-28 DIAGNOSIS — J449 Chronic obstructive pulmonary disease, unspecified: Secondary | ICD-10-CM | POA: Diagnosis not present

## 2020-01-28 DIAGNOSIS — Z299 Encounter for prophylactic measures, unspecified: Secondary | ICD-10-CM | POA: Diagnosis not present

## 2020-01-28 DIAGNOSIS — E1165 Type 2 diabetes mellitus with hyperglycemia: Secondary | ICD-10-CM | POA: Diagnosis not present

## 2020-01-30 NOTE — Progress Notes (Signed)
Cardiology Office Note   Date:  02/02/2020   ID:  Heather [name], DOB 1931-07-16, MRN [id number]  PCP:  Glenda Chroman, MD  Cardiologist:  DR. Harl Bowie    Chief Complaint  Patient presents with  . Chest Pain      History of Present Illness: Heather [name] is a 86 y.o. female who presents for chest pain follow up.  Since pt seen by Dr. Harl Bowie on 12/24/19 by telehealth- she felt so bad she went to [facility] on the 18th of March.  She had SOB that was significant.  She had what sounds like CT of chest and no clots.  She was given steroids and ABX, and breathing treatment, the treatment seemed to help.  She was shaking when she left.  Could be from nebulizer.  She has not felt much better since.   Her BP is elevated today and at home when this happens she takes a sl NTG and BP comes down, she also takes a xanax.  She has only taken lasix once or twice because she has to go to [facility] so much.   She has soreness in chest with the high BP.  Her echo has normal EF and G1DD.  -  She had one night of Rt arm pain but no chest soreness at that time.   To see GI in near future.   1. Chest pain  - history of chest pain, prior cath in 2012 with non-obstructive disease.  - Lexiscan MPI 03/2014 without ischemia, normal LVEF   - ER admit 05/2018 with chest pain. Described as atypical, reproducible with palpation.  - trop neg x 3 - chronic soreness in chest unchanged.  - sorenss under left breast, mild. Often comes on with eating. No exertional symptoms same for today We discussed stress test vs trying meds first and she preferred meds.  on last visit we added imdur, today will increase to 60 mg followup in 1 month.   2. SOB - chronic SOB, for example walking to bathroom from her room. Chronic symptoms somewhat progressed.  - can have some LE edema. She takes a fluid pill as needed, takes chlorthalidone 25mg  prn. Stopped and prn lasix added  Also echo with EF 65-70%  G1DD  --still  having these episodes -her BP was elevated on last visit but controlled today --in ER dx of COPD will obtain records from [facility].   3. HTN  - compliant with meds but still elevated.   4. Elevated LFTs - followed by GI, chronic. Thought to be due to fatty liver.   5. Dyspepsia -followed by GI    - husband passedfew years agoHe passed at home under home hospice due to CHF, kidney failure, and severe COPD.   Got first covid shot, awaiting second dose.   BP was elevated so plan to control BP  Imdur was added for chest discomfort  Lasix from prn to M &F  If no improvement stress test.  Echo was normal.  Good heart function.  Still with pain most atypical.  Poor historian.  We again discussed stressed test but she did not want.  I increased imdur. Her daughter was with her.   Past Medical History:  Diagnosis Date  . Anxiety   . GERD (gastroesophageal reflux disease)   . Hx of shoulder surgery    Left Shoulder - pitched nerve.  . Other and unspecified hyperlipidemia   . Unspecified essential hypertension     Past Surgical History:  Procedure Laterality Date  . CERVICAL DISC SURGERY    . CESAREAN SECTION  1970  . History of shoulder surgery Left    pitched nerve.     Current Outpatient Medications  Medication Sig Dispense Refill  . albuterol (VENTOLIN HFA) 108 (90 Base) MCG/ACT inhaler Inhale 1 puff into the lungs 4 (four) times daily.     Marland Kitchen ALPRAZolam (XANAX) 1 MG tablet Take 1 mg by mouth 2 (two) times daily.     Marland Kitchen amLODipine (NORVASC) 10 MG tablet Take 10 mg by mouth every morning.   12  . Ascorbic Acid (VITAMIN C WITH ROSE HIPS) 500 MG tablet Take 500 mg by mouth daily.    Marland Kitchen aspirin 81 MG tablet Take 81 mg by mouth once as needed for pain.     . bisacodyl (DULCOLAX) 10 MG suppository Place 1 suppository (10 mg total) rectally daily as needed for moderate constipation. 12 suppository 0  . Calcium Carb-Cholecalciferol (CALCIUM 600+D3 PO) Take  600 Units by mouth daily.     . fluorometholone (FML) 0.1 % ophthalmic suspension Place 1 drop into both eyes 2 (two) times daily.   1  . furosemide (LASIX) 40 MG tablet Take 1 tablet (40 mg total) by mouth as needed. 90 tablet 3  . isosorbide mononitrate (IMDUR) 30 MG 24 hr tablet Take 2 tablets (60 mg total) by mouth daily. 180 tablet 3  . Lactobacillus (FLORAJEN ACIDOPHILUS PO) Take by mouth daily.    Marland Kitchen linaclotide (LINZESS) 290 MCG CAPS capsule Take 290 mcg by mouth daily before breakfast.    . metoprolol tartrate (LOPRESSOR) 25 MG tablet Take 25 mg by mouth 2 (two) times daily.  3  . nitroGLYCERIN (NITROSTAT) 0.4 MG SL tablet Place 0.4 mg under the tongue every 5 (five) minutes as needed for chest pain.    Marland Kitchen nystatin-triamcinolone (MYCOLOG II) cream Apply 1 application topically 2 (two) times daily. Apply to area of his skin rash as directed. 60 g 1  . omeprazole (PRILOSEC) 40 MG capsule Take 40 mg by mouth daily.    . polyethylene glycol (MIRALAX / GLYCOLAX) packet Take 17 g by mouth daily as needed for mild constipation or moderate constipation.  14 each   . VITAMIN D PO Take 1 capsule by mouth daily.      No current facility-administered medications for this visit.    Allergies:   Patient has no known allergies.    Social History:  The patient  reports that she has never smoked. She has never used smokeless tobacco. She reports that she does not drink alcohol or use drugs.   Family History:  The patient's family history includes Heart attack in her sister; Hypertension in an other family member.    ROS:  General:no colds or fevers, no weight changes Skin:no rashes or ulcers HEENT:no blurred vision, no congestion CV:see HPI PUL:see HPI GI:no diarrhea constipation or melena, no indigestion GU:no hematuria, no dysuria MS:no joint pain, no claudication Neuro:no syncope, no lightheadedness Endo:no diabetes, no thyroid disease  Wt Readings from Last 3 Encounters:  01/31/20 199  lb (90.3 kg)  01/14/20 198 lb (89.8 kg)  12/24/19 195 lb (88.5 kg)     PHYSICAL EXAM: VS:  BP 140/68   Pulse 71   Temp (!) 96 F (35.6 C)   Ht 5\' 7"  (1.702 m)   Wt 199 lb (90.3 kg)   SpO2 96%   BMI 31.17 kg/m  , BMI Body mass index is 31.17 kg/m. General:Pleasant  affect, NAD Skin:Warm and dry, brisk capillary refill HEENT:normocephalic, sclera clear, mucus membranes moist Neck:supple, no JVD, no bruits  Heart:S1S2 RRR without murmur, gallup, rub or click Lungs:clear without rales, rhonchi, or wheezes [id number], non tender, + BS, do not palpate liver spleen or masses Ext:no lower ext edema, 2+ pedal pulses, 2+ radial pulses Neuro:alert and oriented X 3, MAE, follows commands, + facial symmetry    EKG:  EKG is NOT ordered today.   Recent Labs: 03/18/2019: B Natriuretic Peptide 50.0 08/12/2019: ALT 147; BUN 13; Creat 0.56; Hemoglobin 13.1; Platelets 251; Potassium 4.3; Sodium 141    Lipid Panel No results found for: CHOL, TRIG, HDL, CHOLHDL, VLDL, LDLCALC, LDLDIRECT     Other studies Reviewed: Additional studies/ records that were reviewed today include: . Echo 12/27/19 IMPRESSIONS    1. Left ventricular ejection fraction, by estimation, is 65 to 70%. The  left ventricle has hyperdynamic function. The left ventricle has no  regional wall motion abnormalities. There is mild left ventricular  hypertrophy. Left ventricular diastolic  parameters are consistent with Grade I diastolic dysfunction (impaired  relaxation).  2. Right ventricular systolic function is normal. The right ventricular  size is normal. There is mildly elevated pulmonary artery systolic  pressure.  3. The mitral valve is normal in structure and function. No evidence of  mitral valve regurgitation. No evidence of mitral stenosis.  4. The aortic valve is tricuspid. Aortic valve regurgitation is trivial.  No aortic stenosis is present.  5. The inferior vena cava is normal in size with greater  than 50%  respiratory variability, suggesting right atrial pressure of 3 mmHg.   FINDINGS  Left Ventricle: Left ventricular ejection fraction, by estimation, is 65  to 70%. The left ventricle has hyperdynamic function. The left ventricle  has no regional wall motion abnormalities. The left ventricular internal  cavity size was normal in size.  There is mild left ventricular hypertrophy. Left ventricular diastolic  parameters are consistent with Grade I diastolic dysfunction (impaired  relaxation). Normal left ventricular filling pressure.   Right Ventricle: The right ventricular size is normal. No increase in  right ventricular wall thickness. Right ventricular systolic function is  normal. There is mildly elevated pulmonary artery systolic pressure. The  tricuspid regurgitant velocity is 2.81  m/s, and with an assumed right atrial pressure of 3 mmHg, the estimated  right ventricular systolic pressure is [id number] mmHg.   Left Atrium: Left atrial size was normal in size.   Right Atrium: Right atrial size was normal in size.   Pericardium: There is no evidence of pericardial effusion.   Mitral Valve: The mitral valve is normal in structure and function. No  evidence of mitral valve regurgitation. No evidence of mitral valve  stenosis.   Tricuspid Valve: The tricuspid valve is normal in structure. Tricuspid  valve regurgitation is mild . No evidence of tricuspid stenosis.   Aortic Valve: The aortic valve is tricuspid. Aortic valve regurgitation is  trivial. No aortic stenosis is present. Aortic valve mean gradient  measures 6.2 mmHg. Aortic valve peak gradient measures 12.6 mmHg. Aortic  valve area, by VTI measures 2.54 cm.   Pulmonic Valve: The pulmonic valve was not well visualized. Pulmonic valve  regurgitation is mild. No evidence of pulmonic stenosis.   Aorta: The aortic root is normal in size and structure.   Pulmonary Artery: Mild pulmonary HTN, PASP is 34 mmHg.    Venous: The inferior vena cava is normal in size with greater than 50%  respiratory variability, suggesting right atrial pressure of 3 mmHg.    Cath 07/2011 with mild non obstructive pain.   ASSESSMENT AND PLAN:  1.  Atypical chest pain, improved but will increase imdur to 60 mg daily and follow up in a month  2.  NON obstructive CAD in 2012   3.  HTN improved with increase of lasix from PRN to M&F and imdur  4.  SOB continues but some better today.    Current medicines are reviewed with the patient today.  The patient Has no concerns regarding medicines.  The following changes have been made:  See above Labs/ tests ordered today include:see above  Disposition:   FU:  see above  Signed, Cecilie Kicks, NP  02/02/2020 8:56 PM    [address] [address], [address] [place], [place] Phone: [phone]; Fax: [phone]

## 2020-01-31 ENCOUNTER — Ambulatory Visit: Payer: PPO | Admitting: Cardiology

## 2020-01-31 ENCOUNTER — Encounter: Payer: Self-pay | Admitting: Cardiology

## 2020-01-31 ENCOUNTER — Other Ambulatory Visit: Payer: Self-pay

## 2020-01-31 VITALS — BP 140/68 | HR 71 | Temp 96.0°F | Ht 67.0 in | Wt 199.0 lb

## 2020-01-31 DIAGNOSIS — I2510 Atherosclerotic heart disease of native coronary artery without angina pectoris: Secondary | ICD-10-CM | POA: Diagnosis not present

## 2020-01-31 DIAGNOSIS — R0789 Other chest pain: Secondary | ICD-10-CM | POA: Diagnosis not present

## 2020-01-31 DIAGNOSIS — R0602 Shortness of breath: Secondary | ICD-10-CM | POA: Diagnosis not present

## 2020-01-31 DIAGNOSIS — I10 Essential (primary) hypertension: Secondary | ICD-10-CM | POA: Diagnosis not present

## 2020-01-31 MED ORDER — ISOSORBIDE MONONITRATE ER 30 MG PO TB24: 60.0000 mg | ORAL_TABLET | Freq: Every day | ORAL | 3 refills | Status: DC

## 2020-01-31 NOTE — Patient Instructions (Signed)
Medication Instructions:  Your physician has recommended you make the following change in your medication:  Increase Imdur to 60 mg Daily   *If you need a refill on your cardiac medications before your next appointment, please call your pharmacy*   Lab Work: NONE  If you have labs (blood work) drawn today and your tests are completely normal, you will receive your results only by: Marland Kitchen MyChart Message (if you have MyChart) OR . A paper copy in the mail If you have any lab test that is abnormal or we need to change your treatment, we will call you to review the results.   Testing/Procedures: NONE    Follow-Up: At [facility], you and your health needs are our priority.  As part of our continuing mission to provide you with exceptional heart care, we have created designated Provider Care Teams.  These Care Teams include your primary Cardiologist (physician) and Advanced Practice Providers (APPs -  Physician Assistants and Nurse Practitioners) who all work together to provide you with the care you need, when you need it.  We recommend signing up for the patient portal called "MyChart".  Sign up information is provided on this After Visit Summary.  MyChart is used to connect with patients for Virtual Visits (Telemedicine).  Patients are able to view lab/test results, encounter notes, upcoming appointments, etc.  Non-urgent messages can be sent to your provider as well.   To learn more about what you can do with MyChart, go to [email or web address].    Your next appointment:   2-3 week(s)  The format for your next appointment:   In Person  Provider:   You may see No primary care provider on file. or one of the following Advanced Practice Providers on your designated Care Team:    Bernerd Pho, PA-C   Ermalinda Barrios, [place]     Other Instructions Thank you for choosing [facility]!

## 2020-02-02 ENCOUNTER — Encounter: Payer: Self-pay | Admitting: Cardiology

## 2020-02-03 DIAGNOSIS — I10 Essential (primary) hypertension: Secondary | ICD-10-CM | POA: Diagnosis not present

## 2020-02-03 DIAGNOSIS — M159 Polyosteoarthritis, unspecified: Secondary | ICD-10-CM | POA: Diagnosis not present

## 2020-02-10 ENCOUNTER — Ambulatory Visit (INDEPENDENT_AMBULATORY_CARE_PROVIDER_SITE_OTHER): Payer: PPO | Admitting: Internal Medicine

## 2020-02-10 ENCOUNTER — Other Ambulatory Visit: Payer: Self-pay

## 2020-02-10 ENCOUNTER — Encounter (INDEPENDENT_AMBULATORY_CARE_PROVIDER_SITE_OTHER): Payer: Self-pay | Admitting: Internal Medicine

## 2020-02-10 VITALS — BP 120/69 | HR 66 | Temp 96.9°F | Ht 67.0 in | Wt 201.5 lb

## 2020-02-10 DIAGNOSIS — R7989 Other specified abnormal findings of blood chemistry: Secondary | ICD-10-CM

## 2020-02-10 DIAGNOSIS — K5900 Constipation, unspecified: Secondary | ICD-10-CM

## 2020-02-10 MED ORDER — LINACLOTIDE 290 MCG PO CAPS: 290.0000 ug | ORAL_CAPSULE | Freq: Every day | ORAL | 5 refills | Status: DC

## 2020-02-10 NOTE — Patient Instructions (Signed)
Please ask your health provider to do pelvic exam to determine if you have rectocele. Take Linzess daily or every other day. Increase intake of fiber rich foods as discussed. If symptoms do not improve would consider esophagogastroduodenoscopy and gastric emptying study.

## 2020-02-10 NOTE — Progress Notes (Signed)
Presenting complaint;  Follow-up for abnormal LFTs.  Patient complains of constipation and food not digesting.  Database and subjective: Patient is 86 year old Caucasian female who is here for scheduled visit accompanied by her daughter [name]. She was last seen on 11/11/2019.  Following that visit she had abdominal ultrasound at [facility]  Liver appeared fatty and bile duct was normal caliber. Patient continues to complain of constipation.  She says Linzess are working very well but her co-pay is over $100 a month.  She is not having any side effects with this medication.  She also complains of stool reaching the rectum and just would not come out.  She denies melena rectal bleeding abdominal pain or pruritus.  She was on MiraLAX and it worked for a while and then stopped working. She continues to complain of food not digesting.  She says it sits in her stomach.  She states when her bowels move the sensation goes away.  However she has not had any nausea or vomiting.  Her daughter states she does not eat much.  Patient has not lost any weight since her last visit.  She also complains of swelling and pain in her right knee.  She is planning to see Dr. Woody Seller. to get steroid shot which she has taken in the past and it helped.  Current Medications: Outpatient Encounter Medications as of 02/10/2020  Medication Sig  . albuterol (VENTOLIN HFA) 108 (90 Base) MCG/ACT inhaler Inhale 1 puff into the lungs 4 (four) times daily.   Marland Kitchen ALPRAZolam (XANAX) 1 MG tablet Take 1 mg by mouth 2 (two) times daily.   Marland Kitchen amLODipine (NORVASC) 10 MG tablet Take 10 mg by mouth every morning.   . Ascorbic Acid (VITAMIN C WITH ROSE HIPS) 500 MG tablet Take 500 mg by mouth daily.  Marland Kitchen aspirin 81 MG tablet Take 81 mg by mouth once as needed for pain.   . bisacodyl (DULCOLAX) 10 MG suppository Place 1 suppository (10 mg total) rectally daily as needed for moderate constipation.  . Calcium Carb-Cholecalciferol (CALCIUM 600+D3 PO) Take 600  Units by mouth daily.   . fluorometholone (FML) 0.1 % ophthalmic suspension Place 1 drop into both eyes 2 (two) times daily.   . furosemide (LASIX) 40 MG tablet Take 1 tablet (40 mg total) by mouth as needed.  . isosorbide mononitrate (IMDUR) 30 MG 24 hr tablet Take 2 tablets (60 mg total) by mouth daily.  . Lactobacillus (FLORAJEN ACIDOPHILUS PO) Take by mouth daily.  Marland Kitchen linaclotide (LINZESS) 290 MCG CAPS capsule Take 290 mcg by mouth daily before breakfast.  . metoprolol tartrate (LOPRESSOR) 25 MG tablet Take 25 mg by mouth 2 (two) times daily.  . nitroGLYCERIN (NITROSTAT) 0.4 MG SL tablet Place 0.4 mg under the tongue every 5 (five) minutes as needed for chest pain.  Marland Kitchen nystatin-triamcinolone (MYCOLOG II) cream Apply 1 application topically 2 (two) times daily. Apply to area of his skin rash as directed.  Marland Kitchen omeprazole (PRILOSEC) 40 MG capsule Take 40 mg by mouth daily.  . polyethylene glycol (MIRALAX / GLYCOLAX) packet Take 17 g by mouth daily as needed for mild constipation or moderate constipation.   Marland Kitchen VITAMIN D PO Take 1 capsule by mouth daily.    No facility-administered encounter medications on file as of 02/10/2020.     Objective: Blood pressure 120/69, pulse 66, temperature (!) 96.9 F (36.1 C), temperature source Temporal, height '5\' 7"'$  (1.702 m), weight 201 lb 8 oz (91.4 kg). Patient is alert and in  no acute distress. She is wearing a facial mask. Conjunctiva is pink. Sclera is nonicteric Oropharyngeal mucosa is normal. No neck masses or thyromegaly noted. Cardiac exam with regular rhythm normal S1 and S2. No murmur or gallop noted. Lungs are clear to auscultation. Abdomen is protuberant.  Bowel sounds are normal.  On palpation is soft and nontender with organomegaly or masses. She has trace edema around ankles. She has right knee effusion.  Labs/studies Results:  CBC Latest Ref Rng & Units 08/12/2019 03/18/2019 06/08/2018  WBC 3.8 - 10.8 Thousand/uL 9.6 6.8 7.2  Hemoglobin  11.7 - 15.5 g/dL 13.1 11.4(L) 11.8(L)  Hematocrit 35.0 - 45.0 % 40.7 37.2 37.4  Platelets 140 - 400 Thousand/uL 251 215 236    CMP Latest Ref Rng & Units 08/12/2019 03/18/2019 12/07/2018  Glucose 65 - 139 mg/dL 78 123(H) -  BUN 7 - 25 mg/dL 13 10 -  Creatinine 0.60 - 0.88 mg/dL 0.56(L) 0.64 -  Sodium 135 - 146 mmol/L 141 139 -  Potassium 3.5 - 5.3 mmol/L 4.3 3.5 -  Chloride 98 - 110 mmol/L 103 107 -  CO2 20 - 32 mmol/L 24 22 -  Calcium 8.6 - 10.4 mg/dL 9.9 8.9 -  Total Protein 6.1 - 8.1 g/dL 7.2 6.6 7.2  Total Bilirubin 0.2 - 1.2 mg/dL 0.4 0.4 0.5  Alkaline Phos 38 - 126 U/L - 76 -  AST 10 - 35 U/L 185(H) 115(H) 87(H)  ALT 6 - 29 U/L 147(H) 80(H) 71(H)    Hepatic Function Latest Ref Rng & Units 08/12/2019 03/18/2019 12/07/2018  Total Protein 6.1 - 8.1 g/dL 7.2 6.6 7.2  Albumin 3.5 - 5.0 g/dL - 3.5 -  AST 10 - 35 U/L 185(H) 115(H) 87(H)  ALT 6 - 29 U/L 147(H) 80(H) 71(H)  Alk Phosphatase 38 - 126 U/L - 76 -  Total Bilirubin 0.2 - 1.2 mg/dL 0.4 0.4 0.5  Bilirubin, Direct 0.0 - 0.2 mg/dL - - 0.1    Lab data from January 2021 WBC 8.5 H&H 12.2 and 39.2 Platelet count 211 K.  AST 143.8 and ALT 93. Albumin 4.19.  Ultrasound 11/15/2019 Fatty liver. Bile duct measured 5 mm. Small bilateral renal cysts. 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  Elevated transaminases.  She is felt to have fatty liver.  Transaminases are down compared to 6 months ago.  She does not have stigmata of chronic liver disease.  She has not been able to exercise or lose weight.  No plans for any particular treatment at this time.  #2.  Chronic constipation.  She has become laxative dependent.  She should continue Linzess as long as is working and not causing any side effects.  Last colonoscopy was in October 2013 by Dr. Doristine Mango of [facility] and revealed sigmoid diverticulosis and external hemorrhoids.  #3.  Food not digesting.   This is a chronic symptom.  She had E[facility] at [facility] in 2014 and reportedly was normal.  If this symptom persists despite irregular bowel movements would consider esophagogastroduodenoscopy and gastric emptying study.  Plan:  Patient advised to eat 6 small meals if she cannot have 3 regular meals a day. She must increase intake of fiber rich foods otherwise she would not have a normal bowel movement. Linzess/linaclotide 290 mcg p.o. every morning.  Patient was given 1 month supply of samples and new prescription was sent to the pharmacy.  She may also consider  trying every other day if it works. Patient will call with progress report in few weeks and return for office visit in 6 months. Patient will ask PCP for pelvic exam to find out if she has rectocele.

## 2020-02-12 DIAGNOSIS — N952 Postmenopausal atrophic vaginitis: Secondary | ICD-10-CM | POA: Diagnosis not present

## 2020-02-12 DIAGNOSIS — Z01419 Encounter for gynecological examination (general) (routine) without abnormal findings: Secondary | ICD-10-CM | POA: Diagnosis not present

## 2020-02-12 DIAGNOSIS — Z789 Other specified health status: Secondary | ICD-10-CM | POA: Diagnosis not present

## 2020-02-12 DIAGNOSIS — I10 Essential (primary) hypertension: Secondary | ICD-10-CM | POA: Diagnosis not present

## 2020-02-12 DIAGNOSIS — Z299 Encounter for prophylactic measures, unspecified: Secondary | ICD-10-CM | POA: Diagnosis not present

## 2020-02-12 DIAGNOSIS — N816 Rectocele: Secondary | ICD-10-CM | POA: Diagnosis not present

## 2020-02-12 DIAGNOSIS — K5900 Constipation, unspecified: Secondary | ICD-10-CM | POA: Diagnosis not present

## 2020-02-18 ENCOUNTER — Ambulatory Visit (INDEPENDENT_AMBULATORY_CARE_PROVIDER_SITE_OTHER): Payer: PPO | Admitting: Internal Medicine

## 2020-02-18 DIAGNOSIS — M1711 Unilateral primary osteoarthritis, right knee: Secondary | ICD-10-CM | POA: Diagnosis not present

## 2020-02-18 DIAGNOSIS — M25461 Effusion, right knee: Secondary | ICD-10-CM | POA: Diagnosis not present

## 2020-02-19 DIAGNOSIS — I10 Essential (primary) hypertension: Secondary | ICD-10-CM | POA: Diagnosis not present

## 2020-02-19 DIAGNOSIS — Z299 Encounter for prophylactic measures, unspecified: Secondary | ICD-10-CM | POA: Diagnosis not present

## 2020-02-19 DIAGNOSIS — F419 Anxiety disorder, unspecified: Secondary | ICD-10-CM | POA: Diagnosis not present

## 2020-02-19 DIAGNOSIS — F1320 Sedative, hypnotic or anxiolytic dependence, uncomplicated: Secondary | ICD-10-CM | POA: Diagnosis not present

## 2020-02-19 DIAGNOSIS — E7800 Pure hypercholesterolemia, unspecified: Secondary | ICD-10-CM | POA: Diagnosis not present

## 2020-02-20 DIAGNOSIS — H353131 Nonexudative age-related macular degeneration, bilateral, early dry stage: Secondary | ICD-10-CM | POA: Diagnosis not present

## 2020-02-20 DIAGNOSIS — H524 Presbyopia: Secondary | ICD-10-CM | POA: Diagnosis not present

## 2020-02-20 DIAGNOSIS — Z961 Presence of intraocular lens: Secondary | ICD-10-CM | POA: Diagnosis not present

## 2020-02-20 DIAGNOSIS — H40013 Open angle with borderline findings, low risk, bilateral: Secondary | ICD-10-CM | POA: Diagnosis not present

## 2020-02-20 DIAGNOSIS — H04123 Dry eye syndrome of bilateral lacrimal glands: Secondary | ICD-10-CM | POA: Diagnosis not present

## 2020-02-21 ENCOUNTER — Ambulatory Visit: Payer: PPO | Admitting: Physician Assistant

## 2020-02-21 DIAGNOSIS — I10 Essential (primary) hypertension: Secondary | ICD-10-CM | POA: Diagnosis not present

## 2020-02-24 ENCOUNTER — Telehealth (INDEPENDENT_AMBULATORY_CARE_PROVIDER_SITE_OTHER): Payer: Self-pay | Admitting: *Deleted

## 2020-02-24 NOTE — Telephone Encounter (Signed)
Noted  

## 2020-02-24 NOTE — Telephone Encounter (Signed)
She saw Jocee and she does have a rectocele on her left side.  She does not want to  do anything at her age.  She wanted you to know since you had sent her to be seen for this but she does not want (the Patient) does not want to do anything.

## 2020-02-26 NOTE — Progress Notes (Deleted)
Cardiology Office Note    Date:  02/26/2020   ID:  Heather [name], DOB July 17, 1931, MRN [id number]  PCP:  Glenda Chroman, MD  Cardiologist: No primary care provider on file. EPS: None  No chief complaint on file.   History of Present Illness:  Heather [name] is a 86 y.o. female with history of chest pain with nonobstructive disease on cardiac cath 2012, Lexiscan 2015 without ischemia normal LVEF, hypertension, chronic dyspnea on exertion.  Patient last saw Heather Kicks, NP 01/31/2020 with elevated blood pressures recently diagnosed COPD hospitalized at [facility].  2D echo 12/27/2019 normal LV function with grade 1 DD    Past Medical History:  Diagnosis Date  . Anxiety   . GERD (gastroesophageal reflux disease)   . Hx of shoulder surgery    Left Shoulder - pitched nerve.  . Other and unspecified hyperlipidemia   . Unspecified essential hypertension     Past Surgical History:  Procedure Laterality Date  . CERVICAL DISC SURGERY    . CESAREAN SECTION  1970  . History of shoulder surgery Left    pitched nerve.    Current Medications: No outpatient medications have been marked as taking for the 03/09/20 encounter (Appointment) with Imogene Burn, PA-C.     Allergies:   Patient has no known allergies.   Social History   Socioeconomic History  . Marital status: Widowed    Spouse name: Not on file  . Number of children: Not on file  . Years of education: Not on file  . Highest education level: Not on file  Occupational History  . Not on file  Tobacco Use  . Smoking status: Never Smoker  . Smokeless tobacco: Never Used  Substance and Sexual Activity  . Alcohol use: No    Alcohol/week: 0.0 standard drinks  . Drug use: No  . Sexual activity: Not on file  Other Topics Concern  . Not on file  Social History Narrative  . Not on file   Social Determinants of Health   Financial Resource Strain:   . Difficulty of Paying Living Expenses:   Food Insecurity:   .  Worried About Charity fundraiser in the Last Year:   . Arboriculturist in the Last Year:   Transportation Needs:   . Film/video editor (Medical):   Marland Kitchen Lack of Transportation (Non-Medical):   Physical Activity:   . Days of Exercise per Week:   . Minutes of Exercise per Session:   Stress:   . Feeling of Stress :   Social Connections:   . Frequency of Communication with Friends and Family:   . Frequency of Social Gatherings with Friends and Family:   . Attends Religious Services:   . Active Member of Clubs or Organizations:   . Attends Archivist Meetings:   Marland Kitchen Marital Status:      Family History:  The patient's ***family history includes Heart attack in her sister; Hypertension in an other family member.   ROS:   Please see the history of present illness.    ROS All other systems reviewed and are negative.   PHYSICAL EXAM:   VS:  There were no vitals taken for this visit.  Physical Exam  GEN: Well nourished, well developed, in no acute distress  HEENT: normal  Neck: no JVD, carotid bruits, or masses Cardiac:RRR; no murmurs, rubs, or gallops  Respiratory:  clear to auscultation bilaterally, normal work of breathing GI: soft, nontender, nondistended, + BS  Ext: without cyanosis, clubbing, or edema, Good distal pulses bilaterally MS: no deformity or atrophy  Skin: warm and dry, no rash Neuro:  Alert and Oriented x 3, Strength and sensation are intact Psych: euthymic mood, full affect  Wt Readings from Last 3 Encounters:  02/10/20 201 lb 8 oz (91.4 kg)  01/31/20 199 lb (90.3 kg)  01/14/20 198 lb (89.8 kg)      Studies/Labs Reviewed:   EKG:  EKG is*** ordered today.  The ekg ordered today demonstrates ***  Recent Labs: 03/18/2019: B Natriuretic Peptide 50.0 08/12/2019: ALT 147; BUN 13; Creat 0.56; Hemoglobin 13.1; Platelets 251; Potassium 4.3; Sodium 141   Lipid Panel No results found for: CHOL, TRIG, HDL, CHOLHDL, VLDL, LDLCALC, LDLDIRECT  Additional  studies/ records that were reviewed today include:  2D echo 12/27/2019 Echo 12/27/19 IMPRESSIONS     1. Left ventricular ejection fraction, by estimation, is 65 to 70%. The  left ventricle has hyperdynamic function. The left ventricle has no  regional wall motion abnormalities. There is mild left ventricular  hypertrophy. Left ventricular diastolic  parameters are consistent with Grade I diastolic dysfunction (impaired  relaxation).   2. Right ventricular systolic function is normal. The right ventricular  size is normal. There is mildly elevated pulmonary artery systolic  pressure.   3. The mitral valve is normal in structure and function. No evidence of  mitral valve regurgitation. No evidence of mitral stenosis.   4. The aortic valve is tricuspid. Aortic valve regurgitation is trivial.  No aortic stenosis is present.   5. The inferior vena cava is normal in size with greater than 50%  respiratory variability, suggesting right atrial pressure of 3 mmHg.   FINDINGS   Left Ventricle: Left ventricular ejection fraction, by estimation, is 65  to 70%. The left ventricle has hyperdynamic function. The left ventricle  has no regional wall motion abnormalities. The left ventricular internal  cavity size was normal in size.  There is mild left ventricular hypertrophy. Left ventricular diastolic  parameters are consistent with Grade I diastolic dysfunction (impaired  relaxation). Normal left ventricular filling pressure.   Right Ventricle: The right ventricular size is normal. No increase in  right ventricular wall thickness. Right ventricular systolic function is  normal. There is mildly elevated pulmonary artery systolic pressure. The  tricuspid regurgitant velocity is 2.81   m/s, and with an assumed right atrial pressure of 3 mmHg, the estimated  right ventricular systolic pressure is [id number] mmHg.   Left Atrium: Left atrial size was normal in size.   Right Atrium: Right atrial size was  normal in size.   Pericardium: There is no evidence of pericardial effusion.   Mitral Valve: The mitral valve is normal in structure and function. No  evidence of mitral valve regurgitation. No evidence of mitral valve  stenosis.   Tricuspid Valve: The tricuspid valve is normal in structure. Tricuspid  valve regurgitation is mild . No evidence of tricuspid stenosis.   Aortic Valve: The aortic valve is tricuspid. Aortic valve regurgitation is  trivial. No aortic stenosis is present. Aortic valve mean gradient  measures 6.2 mmHg. Aortic valve peak gradient measures 12.6 mmHg. Aortic  valve area, by VTI measures 2.54 cm.   Pulmonic Valve: The pulmonic valve was not well visualized. Pulmonic valve  regurgitation is mild. No evidence of pulmonic stenosis.   Aorta: The aortic root is normal in size and structure.   Pulmonary Artery: Mild pulmonary HTN, PASP is 34 mmHg.   Venous:  The inferior vena cava is normal in size with greater than 50%  respiratory variability, suggesting right atrial pressure of 3 mmHg.      Cath 07/2011 with mild non obstructive pain.       ASSESSMENT:    No diagnosis found.   PLAN:  In order of problems listed above:  History of chest pain nonobstructive disease on cath in 2012, normal Lexiscan 2015, Imdur added  Hypertension  Chronic diastolic dysfunction  Dyspepsia and elevated LFTs felt to be fatty liver followed by GI  Medication Adjustments/Labs and Tests Ordered: Current medicines are reviewed at length with the patient today.  Concerns regarding medicines are outlined above.  Medication changes, Labs and Tests ordered today are listed in the Patient Instructions below. There are no Patient Instructions on file for this visit.   Sumner Boast, PA-C  02/26/2020 2:47 PM    [facility] [address], [place], [place]  [address] Phone: [phone]; Fax: [phone]

## 2020-03-09 ENCOUNTER — Ambulatory Visit: Payer: PPO | Admitting: Physician Assistant

## 2020-03-09 DIAGNOSIS — I10 Essential (primary) hypertension: Secondary | ICD-10-CM

## 2020-03-09 DIAGNOSIS — I5032 Chronic diastolic (congestive) heart failure: Secondary | ICD-10-CM

## 2020-03-09 DIAGNOSIS — R079 Chest pain, unspecified: Secondary | ICD-10-CM

## 2020-03-11 ENCOUNTER — Telehealth (INDEPENDENT_AMBULATORY_CARE_PROVIDER_SITE_OTHER): Payer: Self-pay | Admitting: *Deleted

## 2020-03-11 DIAGNOSIS — I10 Essential (primary) hypertension: Secondary | ICD-10-CM | POA: Diagnosis not present

## 2020-03-11 DIAGNOSIS — Z299 Encounter for prophylactic measures, unspecified: Secondary | ICD-10-CM | POA: Diagnosis not present

## 2020-03-11 DIAGNOSIS — K3184 Gastroparesis: Secondary | ICD-10-CM | POA: Diagnosis not present

## 2020-03-11 DIAGNOSIS — R42 Dizziness and giddiness: Secondary | ICD-10-CM | POA: Diagnosis not present

## 2020-03-11 NOTE — Telephone Encounter (Signed)
Patient's daughter called in wanting to schedule patient for a procedure - please advise which procedure she needs - your last note mentions EGD & GES

## 2020-03-16 DIAGNOSIS — R0789 Other chest pain: Secondary | ICD-10-CM | POA: Diagnosis not present

## 2020-03-16 DIAGNOSIS — I10 Essential (primary) hypertension: Secondary | ICD-10-CM | POA: Diagnosis not present

## 2020-03-16 DIAGNOSIS — R079 Chest pain, unspecified: Secondary | ICD-10-CM | POA: Diagnosis not present

## 2020-03-16 DIAGNOSIS — I700 Atherosclerosis of aorta: Secondary | ICD-10-CM | POA: Diagnosis not present

## 2020-03-16 DIAGNOSIS — R000 Tachycardia, unspecified: Secondary | ICD-10-CM | POA: Diagnosis not present

## 2020-03-16 DIAGNOSIS — R0689 Other abnormalities of breathing: Secondary | ICD-10-CM | POA: Diagnosis not present

## 2020-03-16 DIAGNOSIS — R0602 Shortness of breath: Secondary | ICD-10-CM | POA: Diagnosis not present

## 2020-03-16 DIAGNOSIS — E785 Hyperlipidemia, unspecified: Secondary | ICD-10-CM | POA: Diagnosis not present

## 2020-03-16 DIAGNOSIS — R0902 Hypoxemia: Secondary | ICD-10-CM | POA: Diagnosis not present

## 2020-03-16 DIAGNOSIS — I2510 Atherosclerotic heart disease of native coronary artery without angina pectoris: Secondary | ICD-10-CM | POA: Diagnosis not present

## 2020-03-22 DIAGNOSIS — I10 Essential (primary) hypertension: Secondary | ICD-10-CM | POA: Diagnosis not present

## 2020-03-22 DIAGNOSIS — M159 Polyosteoarthritis, unspecified: Secondary | ICD-10-CM | POA: Diagnosis not present

## 2020-03-23 NOTE — Telephone Encounter (Signed)
Esophagogastroduodenoscopy

## 2020-03-24 ENCOUNTER — Encounter (INDEPENDENT_AMBULATORY_CARE_PROVIDER_SITE_OTHER): Payer: Self-pay | Admitting: *Deleted

## 2020-03-24 ENCOUNTER — Other Ambulatory Visit (INDEPENDENT_AMBULATORY_CARE_PROVIDER_SITE_OTHER): Payer: Self-pay | Admitting: *Deleted

## 2020-03-24 DIAGNOSIS — K219 Gastro-esophageal reflux disease without esophagitis: Secondary | ICD-10-CM

## 2020-03-24 NOTE — Telephone Encounter (Signed)
EGD sch'd 04/09/20, patient's daughter [name] aware, instructions mailed

## 2020-04-02 DIAGNOSIS — M1711 Unilateral primary osteoarthritis, right knee: Secondary | ICD-10-CM | POA: Diagnosis not present

## 2020-04-07 ENCOUNTER — Other Ambulatory Visit (HOSPITAL_COMMUNITY)
Admission: RE | Admit: 2020-04-07 | Discharge: 2020-04-07 | Disposition: A | Discharge status: Home / Self Care | Payer: PPO | Source: Ambulatory Visit | Attending: Internal Medicine | Admitting: Internal Medicine

## 2020-04-07 ENCOUNTER — Other Ambulatory Visit: Payer: Self-pay

## 2020-04-07 DIAGNOSIS — Z01812 Encounter for preprocedural laboratory examination: Secondary | ICD-10-CM | POA: Diagnosis not present

## 2020-04-07 DIAGNOSIS — Z20822 Contact with and (suspected) exposure to covid-19: Secondary | ICD-10-CM | POA: Diagnosis not present

## 2020-04-07 LAB — SARS CORONAVIRUS 2 (TAT 6-24 HRS): SARS Coronavirus 2: NEGATIVE

## 2020-04-09 ENCOUNTER — Encounter (HOSPITAL_COMMUNITY): Payer: Self-pay | Admitting: Internal Medicine

## 2020-04-09 ENCOUNTER — Other Ambulatory Visit: Payer: Self-pay

## 2020-04-09 ENCOUNTER — Encounter (HOSPITAL_COMMUNITY)
Admission: RE | Disposition: A | Discharge status: Home / Self Care | Payer: Self-pay | Source: Home / Self Care | Attending: Internal Medicine

## 2020-04-09 ENCOUNTER — Ambulatory Visit (HOSPITAL_COMMUNITY)
Admission: RE | Admit: 2020-04-09 | Discharge: 2020-04-09 | Disposition: A | Discharge status: Home / Self Care | Payer: PPO | Attending: Internal Medicine | Admitting: Internal Medicine

## 2020-04-09 DIAGNOSIS — I10 Essential (primary) hypertension: Secondary | ICD-10-CM | POA: Insufficient documentation

## 2020-04-09 DIAGNOSIS — Z7982 Long term (current) use of aspirin: Secondary | ICD-10-CM | POA: Diagnosis not present

## 2020-04-09 DIAGNOSIS — K3189 Other diseases of stomach and duodenum: Secondary | ICD-10-CM | POA: Diagnosis not present

## 2020-04-09 DIAGNOSIS — R1013 Epigastric pain: Secondary | ICD-10-CM | POA: Insufficient documentation

## 2020-04-09 DIAGNOSIS — Z79899 Other long term (current) drug therapy: Secondary | ICD-10-CM | POA: Insufficient documentation

## 2020-04-09 DIAGNOSIS — F419 Anxiety disorder, unspecified: Secondary | ICD-10-CM | POA: Insufficient documentation

## 2020-04-09 DIAGNOSIS — K228 Other specified diseases of esophagus: Secondary | ICD-10-CM | POA: Diagnosis not present

## 2020-04-09 DIAGNOSIS — K5909 Other constipation: Secondary | ICD-10-CM | POA: Diagnosis not present

## 2020-04-09 DIAGNOSIS — E785 Hyperlipidemia, unspecified: Secondary | ICD-10-CM | POA: Diagnosis not present

## 2020-04-09 DIAGNOSIS — K219 Gastro-esophageal reflux disease without esophagitis: Secondary | ICD-10-CM | POA: Diagnosis not present

## 2020-04-09 DIAGNOSIS — R1031 Right lower quadrant pain: Secondary | ICD-10-CM | POA: Diagnosis not present

## 2020-04-09 DIAGNOSIS — Z8249 Family history of ischemic heart disease and other diseases of the circulatory system: Secondary | ICD-10-CM | POA: Insufficient documentation

## 2020-04-09 DIAGNOSIS — R6881 Early satiety: Secondary | ICD-10-CM | POA: Insufficient documentation

## 2020-04-09 HISTORY — PX: ESOPHAGOGASTRODUODENOSCOPY: SHX5428

## 2020-04-09 HISTORY — PX: BIOPSY: SHX5522

## 2020-04-09 SURGERY — EGD (ESOPHAGOGASTRODUODENOSCOPY)
Anesthesia: Moderate Sedation

## 2020-04-09 MED ORDER — MIDAZOLAM HCL 5 MG/5ML IJ SOLN
INTRAMUSCULAR | Status: DC | PRN
  Administered 2020-04-09 (×4): 1 mg via INTRAVENOUS

## 2020-04-09 MED ORDER — MIDAZOLAM HCL 5 MG/5ML IJ SOLN
INTRAMUSCULAR | Status: AC
  Filled 2020-04-09: qty 10

## 2020-04-09 MED ORDER — LIDOCAINE VISCOUS HCL 2 % MT SOLN
OROMUCOSAL | Status: AC
  Filled 2020-04-09: qty 15

## 2020-04-09 MED ORDER — SODIUM CHLORIDE 0.9 % IV SOLN: INTRAVENOUS | Status: DC

## 2020-04-09 MED ORDER — MEPERIDINE HCL 50 MG/ML IJ SOLN
INTRAMUSCULAR | Status: DC | PRN
  Administered 2020-04-09 (×2): 20 mg
  Administered 2020-04-09: 10 mg

## 2020-04-09 MED ORDER — MEPERIDINE HCL 50 MG/ML IJ SOLN
INTRAMUSCULAR | Status: AC
  Filled 2020-04-09: qty 1

## 2020-04-09 MED ORDER — LIDOCAINE VISCOUS HCL 2 % MT SOLN
OROMUCOSAL | Status: DC | PRN
  Administered 2020-04-09: 5 mL via OROMUCOSAL

## 2020-04-09 NOTE — Op Note (Signed)
[facility] Patient Name: Heather [name] Procedure Date: 04/09/2020 10:12 AM MRN: [id number] Date of Birth: 10/11/1931 Attending MD: Hildred Laser , MD CSN: [id number] Age: 86 Admit Type: Outpatient Procedure:                Upper GI endoscopy Indications:              Epigastric abdominal pain, Early satiety Providers:                Hildred Laser, MD, Lurline Del, RN, Janeece Riggers, RN,                            Sterling Page Referring MD:             Glenda Chroman, MD Medicines:                Lidocaine spray, Meperidine 50 mg IV, Midazolam 4                            mg IV Complications:            No immediate complications. Estimated Blood Loss:     Estimated blood loss was minimal. Procedure:                Pre-Anesthesia Assessment:                           - Prior to the procedure, a History and Physical                            was performed, and patient medications and                            allergies were reviewed. The patient's tolerance of                            previous anesthesia was also reviewed. The risks                            and benefits of the procedure and the sedation                            options and risks were discussed with the patient.                            All questions were answered, and informed consent                            was obtained. Prior Anticoagulants: The patient has                            taken no previous anticoagulant or antiplatelet                            agents except for aspirin. ASA Grade Assessment: II                            -  A patient with mild systemic disease. After                            reviewing the risks and benefits, the patient was                            deemed in satisfactory condition to undergo the                            procedure.                           After obtaining informed consent, the endoscope was                            passed under direct vision.  Throughout the                            procedure, the patient's blood pressure, pulse, and                            oxygen saturations were monitored continuously. The                            [id number]-H190 ([id number]) scope was introduced through the                            mouth, and advanced to the second part of duodenum.                            The upper GI endoscopy was accomplished without                            difficulty. The patient tolerated the procedure                            well. Scope In: 10:39:42 AM Scope Out: 10:47:14 AM Total Procedure Duration: 0 hours 7 minutes 32 seconds  Findings:      The hypopharynx was normal.      The examined esophagus was normal.      The Z-line was irregular and was found 38 cm from the incisors.      Patchy mildly erythematous mucosa without bleeding was found in the       gastric antrum and in the prepyloric region of the stomach. Biopsies       were taken with a cold forceps for histology.      The exam of the stomach was otherwise normal.      The duodenal bulb and second portion of the duodenum were normal. Impression:               - Normal hypopharynx.                           - Normal esophagus.                           -  Z-line irregular, 38 cm from the incisors.                           - Erythematous mucosa in the antrum and prepyloric                            region of the stomach. Biopsied.                           - Normal duodenal bulb and second portion of the                            duodenum. Moderate Sedation:      Moderate (conscious) sedation was administered by the endoscopy nurse       and supervised by the endoscopist. The following parameters were       monitored: oxygen saturation, heart rate, blood pressure, CO2       capnography and response to care. Total physician intraservice time was       16 minutes. Recommendation:           - Resume previous diet today.                           -  Continue present medications.                           - No aspirin, ibuprofen, naproxen, or other                            non-steroidal anti-inflammatory drugs for 1 day.                           - FDgard one tablet before breakfast and kuncg                            daily.                           - Await pathology results. Procedure Code(s):        --- Professional ---                           [phone], Esophagogastroduodenoscopy, flexible,                            transoral; with biopsy, single or multiple                           G0500, Moderate sedation services provided by the                            same physician or other qualified health care                            professional performing a gastrointestinal  endoscopic service that sedation supports,                            requiring the presence of an independent trained                            observer to assist in the monitoring of the                            patient's level of consciousness and physiological                            status; initial 15 minutes of intra-service time;                            patient age 50 years or older (additional time may                            be reported with [id number], as appropriate) Diagnosis Code(s):        --- Professional ---                           K22.8, Other specified diseases of esophagus                           K31.89, Other diseases of stomach and duodenum                           R10.13, Epigastric pain                           R68.81, Early satiety CPT copyright 2019 American Medical Association. All rights reserved. The codes documented in this report are preliminary and upon coder review may  be revised to meet current compliance requirements. Hildred Laser, MD Hildred Laser, MD 04/09/2020 11:03:10 AM This report has been signed electronically. Number of Addenda: 0

## 2020-04-09 NOTE — H&P (Signed)
Heather [name] is an 86 y.o. female.   Chief Complaint: Patient is here for esophagogastroduodenoscopy. HPI: Patient is 86 year old Caucasian female with chronic postprandial epigastric pain fullness and early satiety.  She states whenever food she eats it just stays in her stomach and does not go anywhere.  However she does not have nausea vomiting.  She says she has heartburn at night but not during daytime.  She denies dysphagia.  She has chronic constipation.  She is using Dulcolax suppository on as-needed basis in addition to Cetronia.  She denies melena or rectal bleeding.  She has chronically elevated transaminases felt to be due to fatty liver.  She had ultrasound about 2 months ago at [facility] and was negative for cholelithiasis or gallbladder wall thickening or dilated bile duct. She is here for diagnostic esophagogastroduodenoscopy.  Past Medical History:  Diagnosis Date  . Anxiety   . GERD (gastroesophageal reflux disease)   . Hx of shoulder surgery    Left Shoulder - pitched nerve.  . Other and unspecified hyperlipidemia   . Unspecified essential hypertension     Past Surgical History:  Procedure Laterality Date  . CERVICAL DISC SURGERY    . CESAREAN SECTION  1970  . History of shoulder surgery Left    pitched nerve.    Family History  Problem Relation Age of Onset  . Hypertension Other   . Heart attack Sister    Social History:  reports that she has never smoked. She has never used smokeless tobacco. She reports that she does not drink alcohol and does not use drugs.  Allergies: No Known Allergies  Medications Prior to Admission  Medication Sig Dispense Refill  . albuterol (VENTOLIN HFA) 108 (90 Base) MCG/ACT inhaler Inhale 2 puffs into the lungs every 4 (four) hours as needed for wheezing or shortness of breath.     . ALPRAZolam (XANAX) 1 MG tablet Take 1 mg by mouth 2 (two) times daily.     Marland Kitchen amLODipine (NORVASC) 10 MG tablet Take 10 mg by mouth every morning.   12   . Ascorbic Acid (VITAMIN C WITH ROSE HIPS) 500 MG tablet Take 500 mg by mouth daily.    Marland Kitchen aspirin 81 MG tablet Take 162 mg by mouth in the morning and at bedtime.     . ferrous sulfate 325 (65 FE) MG tablet Take 325 mg by mouth daily with breakfast.    . furosemide (LASIX) 40 MG tablet Take 1 tablet (40 mg total) by mouth as needed. (Patient taking differently: Take 40 mg by mouth daily as needed for fluid. ) 90 tablet 3  . isosorbide mononitrate (IMDUR) 30 MG 24 hr tablet Take 2 tablets (60 mg total) by mouth daily. 180 tablet 3  . linaclotide (LINZESS) 290 MCG CAPS capsule Take 1 capsule (290 mcg total) by mouth daily before breakfast. 30 capsule 5  . metoprolol tartrate (LOPRESSOR) 25 MG tablet Take 25 mg by mouth 2 (two) times daily.  3  . Naphazoline-Pheniramine (OPCON-A) 0.027-0.315 % SOLN Place 1 drop into both eyes 2 (two) times daily as needed (dry eyes).    . nitroGLYCERIN (NITROSTAT) 0.4 MG SL tablet Place 0.4 mg under the tongue every 5 (five) minutes as needed for chest pain.    Marland Kitchen omeprazole (PRILOSEC) 40 MG capsule Take 40 mg by mouth daily.    . Calcium-Magnesium-Zinc (CAL-MAG-ZINC PO) Take 1 tablet by mouth daily.    Marland Kitchen VITAMIN D PO Take 1 capsule by mouth daily.  Results for orders placed or performed during the hospital encounter of 04/07/20 (from the past 48 hour(s))  SARS CORONAVIRUS 2 (TAT 6-24 HRS) Nasopharyngeal Nasopharyngeal Swab     Status: None   Collection Time: 04/07/20 11:15 AM   Specimen: Nasopharyngeal Swab  Result Value Ref Range   SARS Coronavirus 2 NEGATIVE NEGATIVE    Comment: (NOTE) SARS-CoV-2 target nucleic acids are NOT DETECTED.  The SARS-CoV-2 RNA is generally detectable in upper and lower respiratory specimens during the acute phase of infection. Negative results do not preclude SARS-CoV-2 infection, do not rule out co-infections with other pathogens, and should not be used as the sole basis for treatment or other patient management  decisions. Negative results must be combined with clinical observations, patient history, and epidemiological information. The expected result is Negative.  Fact Sheet for Patients: SugarRoll.be  Fact Sheet for Healthcare Providers: https://www.woods-mathews.com/  This test is not yet approved or cleared by the [place] FDA and  has been authorized for detection and/or diagnosis of SARS-CoV-2 by FDA under an Emergency Use Authorization (EUA). This EUA will remain  in effect (meaning this test can be used) for the duration of the COVID-19 declaration under Se ction 564(b)(1) of the Act, 21 U.S.C. section 360bbb-3(b)(1), unless the authorization is terminated or revoked sooner.  Performed at [facility], [place] [address], [place], [place] [address]    No results found.  Review of Systems  Blood pressure (!) 170/68, pulse 70, temperature 97.9 F (36.6 C), temperature source Oral, resp. rate 18, height 5\' 7"  (1.702 m), weight 90.3 kg, SpO2 95 %. Physical Exam  HENT:  Mouth/Throat: Mucous membranes are moist. Oropharynx is clear.  Eyes: Conjunctivae are normal. No scleral icterus.  Cardiovascular: Normal rate, regular rhythm and normal heart sounds.  No murmur heard. Respiratory: Effort normal and breath sounds normal.  GI:  Abdomen is full but it is soft on palpation with mild generalized tenderness.  No organomegaly or masses.  Musculoskeletal:        General: No swelling.     Cervical back: Normal range of motion.  Lymphadenopathy:    She has no cervical adenopathy.  Neurological: She is alert.  Skin: Skin is warm and dry.  Psychiatric: Mood normal.     Assessment/Plan Early satiety postprandial fullness and abdominal pain. Diagnostic esophagogastroduodenoscopy.  Hildred Laser, MD 04/09/2020, 10:25 AM

## 2020-04-09 NOTE — Discharge Instructions (Signed)
No aspirin or NSAIDs for 24 hours. Resume usual medications as before. Resume usual diet. No driving for 24 hours. Patient will call with biopsy results.  FDgard 1 tablet or capsule before breakfast and lunch daily.  Try this medication for 2 weeks and if it does not help then try IBgard before each meal for 1 to 2 weeks. Both of these medications are OTC.     Upper Endoscopy, Adult, Care After This sheet gives you information about how to care for yourself after your procedure. Your health care provider may also give you more specific instructions. If you have problems or questions, contact your health care provider. What can I expect after the procedure? After the procedure, it is common to have:  A sore throat.  Mild stomach pain or discomfort.  Bloating.  Nausea. Follow these instructions at home:   Follow instructions from your health care provider about what to eat or drink after your procedure.  Return to your normal activities as told by your health care provider. Ask your health care provider what activities are safe for you.  Take over-the-counter and prescription medicines only as told by your health care provider.  Do not drive for 24 hours if you were given a sedative during your procedure.  Keep all follow-up visits as told by your health care provider. This is important. Contact a health care provider if you have:  A sore throat that lasts longer than one day.  Trouble swallowing. Get help right away if:  You vomit blood or your vomit looks like coffee grounds.  You have: ? A fever. ? Bloody, black, or tarry stools. ? A severe sore throat or you cannot swallow. ? Difficulty breathing. ? Severe pain in your chest or abdomen. Summary  After the procedure, it is common to have a sore throat, mild stomach discomfort, bloating, and nausea.  Do not drive for 24 hours if you were given a sedative during the procedure.  Follow instructions from your  health care provider about what to eat or drink after your procedure.  Return to your normal activities as told by your health care provider. This information is not intended to replace advice given to you by your health care provider. Make sure you discuss any questions you have with your health care provider. Document Revised: 04/03/2018 Document Reviewed: 03/12/2018 Elsevier Patient Education  [place].

## 2020-04-10 ENCOUNTER — Other Ambulatory Visit: Payer: Self-pay

## 2020-04-10 LAB — SURGICAL PATHOLOGY

## 2020-04-11 ENCOUNTER — Other Ambulatory Visit (INDEPENDENT_AMBULATORY_CARE_PROVIDER_SITE_OTHER): Payer: Self-pay | Admitting: Internal Medicine

## 2020-04-11 MED ORDER — MOTEGRITY 1 MG PO TABS: 1.0000 mg | ORAL_TABLET | Freq: Every day | ORAL | 5 refills | Status: DC

## 2020-04-14 ENCOUNTER — Encounter (HOSPITAL_COMMUNITY): Payer: Self-pay | Admitting: Internal Medicine

## 2020-04-22 DIAGNOSIS — I10 Essential (primary) hypertension: Secondary | ICD-10-CM | POA: Diagnosis not present

## 2020-04-22 DIAGNOSIS — M159 Polyosteoarthritis, unspecified: Secondary | ICD-10-CM | POA: Diagnosis not present

## 2020-05-01 IMAGING — CR PORTABLE CHEST - 1 VIEW
1 series · 1 of 1 positions shown · non-contrast
Comparison: June 09, 2018

CLINICAL DATA: Chest pain and shortness of breath

EXAM:
PORTABLE CHEST 1 VIEW

[portable]
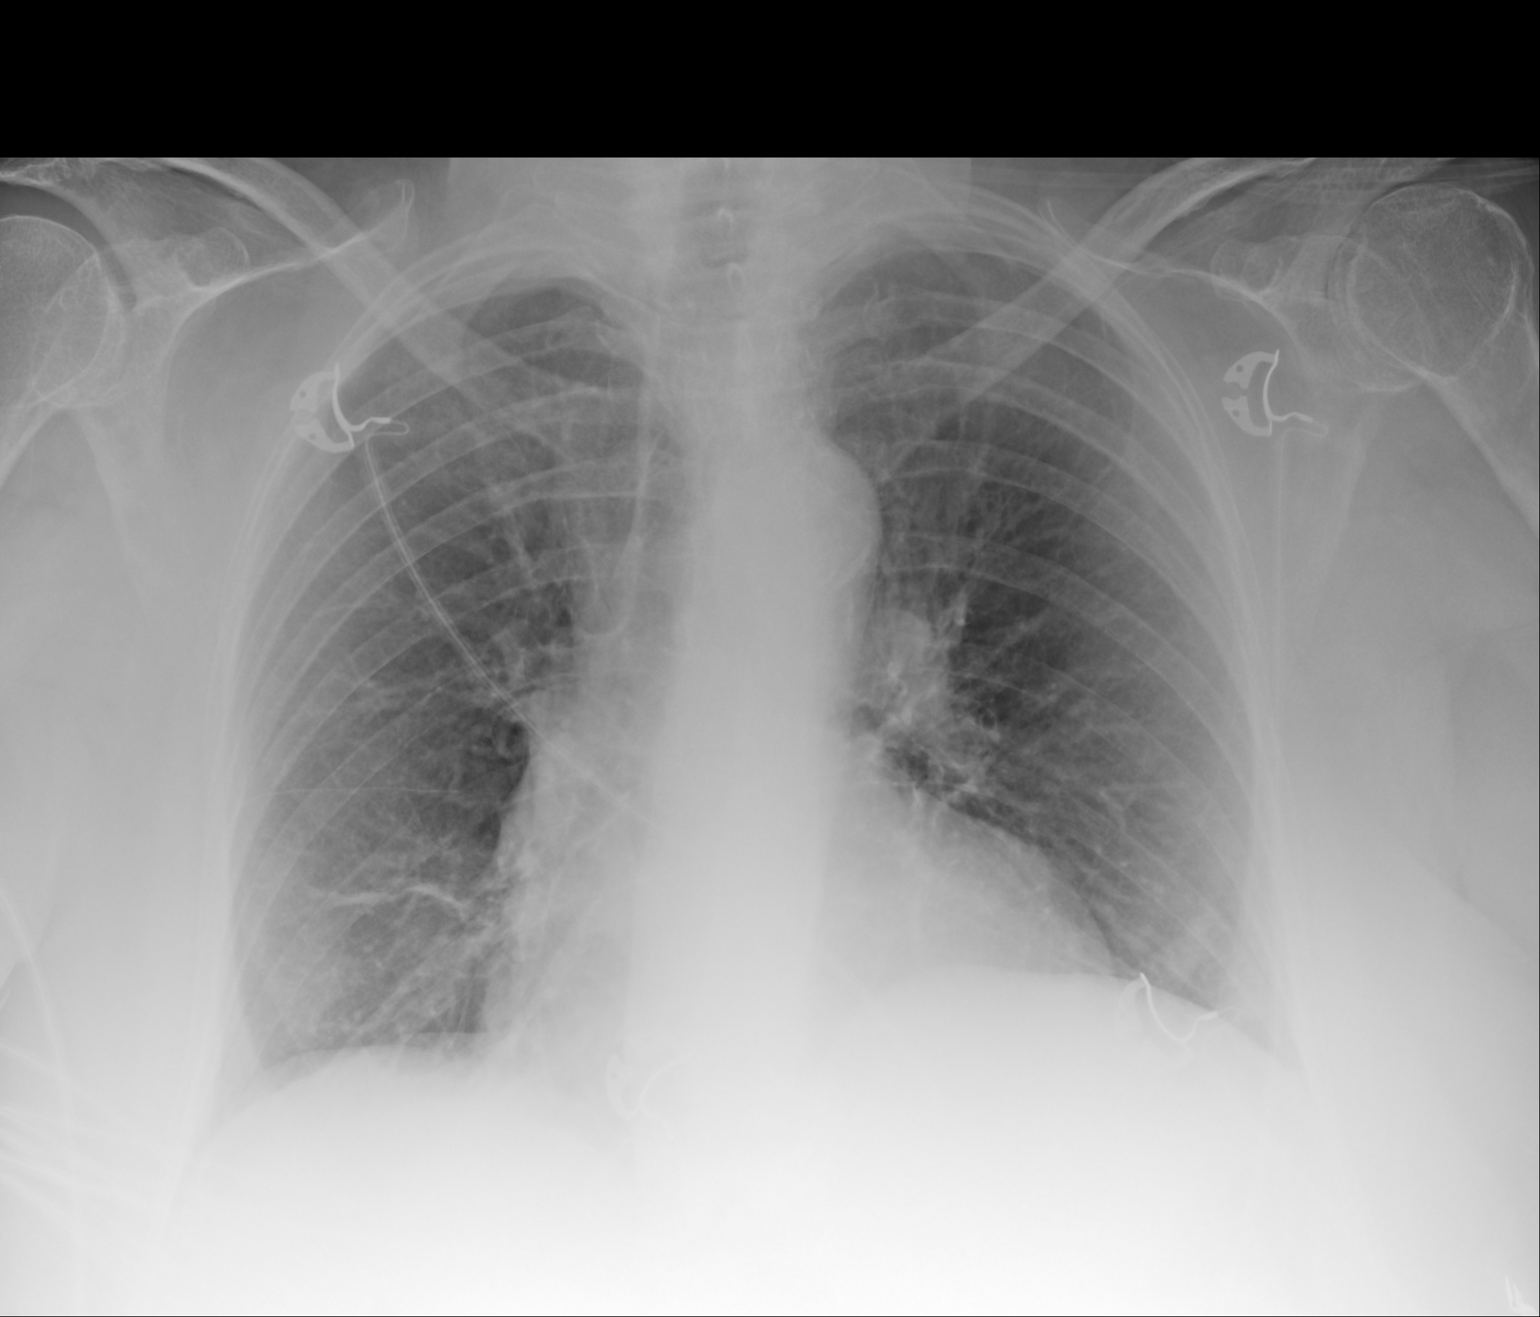

[1 of 1 positions shown; findings below may reference images not displayed]

FINDINGS: There is mild scarring in the right base. There is no edema or
consolidation. Heart is upper normal in size with mild pulmonary
venous hypertension. No adenopathy. There is aortic atherosclerosis.
No bone lesions. No pneumothorax.
IMPRESSION: Mild pulmonary vascular congestion without edema or consolidation.
Scarring right base. Aortic Atherosclerosis ([id number]).

## 2020-05-06 DIAGNOSIS — D692 Other nonthrombocytopenic purpura: Secondary | ICD-10-CM | POA: Diagnosis not present

## 2020-05-06 DIAGNOSIS — I10 Essential (primary) hypertension: Secondary | ICD-10-CM | POA: Diagnosis not present

## 2020-05-06 DIAGNOSIS — E1165 Type 2 diabetes mellitus with hyperglycemia: Secondary | ICD-10-CM | POA: Diagnosis not present

## 2020-05-06 DIAGNOSIS — F1320 Sedative, hypnotic or anxiolytic dependence, uncomplicated: Secondary | ICD-10-CM | POA: Diagnosis not present

## 2020-05-06 DIAGNOSIS — Z299 Encounter for prophylactic measures, unspecified: Secondary | ICD-10-CM | POA: Diagnosis not present

## 2020-05-06 DIAGNOSIS — F321 Major depressive disorder, single episode, moderate: Secondary | ICD-10-CM | POA: Diagnosis not present

## 2020-05-09 DIAGNOSIS — Z20822 Contact with and (suspected) exposure to covid-19: Secondary | ICD-10-CM | POA: Diagnosis not present

## 2020-05-09 DIAGNOSIS — J069 Acute upper respiratory infection, unspecified: Secondary | ICD-10-CM | POA: Diagnosis not present

## 2020-05-09 DIAGNOSIS — Z7982 Long term (current) use of aspirin: Secondary | ICD-10-CM | POA: Diagnosis not present

## 2020-05-09 DIAGNOSIS — E785 Hyperlipidemia, unspecified: Secondary | ICD-10-CM | POA: Diagnosis not present

## 2020-05-09 DIAGNOSIS — J449 Chronic obstructive pulmonary disease, unspecified: Secondary | ICD-10-CM | POA: Diagnosis not present

## 2020-05-09 DIAGNOSIS — D72829 Elevated white blood cell count, unspecified: Secondary | ICD-10-CM | POA: Diagnosis not present

## 2020-05-09 DIAGNOSIS — K5641 Fecal impaction: Secondary | ICD-10-CM | POA: Diagnosis not present

## 2020-05-09 DIAGNOSIS — R0902 Hypoxemia: Secondary | ICD-10-CM | POA: Diagnosis not present

## 2020-05-09 DIAGNOSIS — M6281 Muscle weakness (generalized): Secondary | ICD-10-CM | POA: Diagnosis not present

## 2020-05-09 DIAGNOSIS — J9809 Other diseases of bronchus, not elsewhere classified: Secondary | ICD-10-CM | POA: Diagnosis not present

## 2020-05-09 DIAGNOSIS — R2689 Other abnormalities of gait and mobility: Secondary | ICD-10-CM | POA: Diagnosis not present

## 2020-05-09 DIAGNOSIS — K644 Residual hemorrhoidal skin tags: Secondary | ICD-10-CM | POA: Diagnosis not present

## 2020-05-09 DIAGNOSIS — J9811 Atelectasis: Secondary | ICD-10-CM | POA: Diagnosis not present

## 2020-05-09 DIAGNOSIS — J9611 Chronic respiratory failure with hypoxia: Secondary | ICD-10-CM | POA: Diagnosis not present

## 2020-05-09 DIAGNOSIS — R339 Retention of urine, unspecified: Secondary | ICD-10-CM | POA: Diagnosis not present

## 2020-05-09 DIAGNOSIS — F419 Anxiety disorder, unspecified: Secondary | ICD-10-CM | POA: Diagnosis not present

## 2020-05-09 DIAGNOSIS — R509 Fever, unspecified: Secondary | ICD-10-CM | POA: Diagnosis not present

## 2020-05-09 DIAGNOSIS — K5900 Constipation, unspecified: Secondary | ICD-10-CM | POA: Diagnosis not present

## 2020-05-09 DIAGNOSIS — K219 Gastro-esophageal reflux disease without esophagitis: Secondary | ICD-10-CM | POA: Diagnosis not present

## 2020-05-09 DIAGNOSIS — R41841 Cognitive communication deficit: Secondary | ICD-10-CM | POA: Diagnosis not present

## 2020-05-09 DIAGNOSIS — K449 Diaphragmatic hernia without obstruction or gangrene: Secondary | ICD-10-CM | POA: Diagnosis not present

## 2020-05-09 DIAGNOSIS — K529 Noninfective gastroenteritis and colitis, unspecified: Secondary | ICD-10-CM | POA: Diagnosis not present

## 2020-05-09 DIAGNOSIS — K6289 Other specified diseases of anus and rectum: Secondary | ICD-10-CM | POA: Diagnosis not present

## 2020-05-09 DIAGNOSIS — K5120 Ulcerative (chronic) proctitis without complications: Secondary | ICD-10-CM | POA: Diagnosis not present

## 2020-05-09 DIAGNOSIS — R109 Unspecified abdominal pain: Secondary | ICD-10-CM | POA: Diagnosis not present

## 2020-05-09 DIAGNOSIS — K523 Indeterminate colitis: Secondary | ICD-10-CM | POA: Diagnosis not present

## 2020-05-09 DIAGNOSIS — I10 Essential (primary) hypertension: Secondary | ICD-10-CM | POA: Diagnosis not present

## 2020-05-15 DIAGNOSIS — M6281 Muscle weakness (generalized): Secondary | ICD-10-CM | POA: Diagnosis not present

## 2020-05-15 DIAGNOSIS — J069 Acute upper respiratory infection, unspecified: Secondary | ICD-10-CM | POA: Diagnosis not present

## 2020-05-15 DIAGNOSIS — I25119 Atherosclerotic heart disease of native coronary artery with unspecified angina pectoris: Secondary | ICD-10-CM | POA: Diagnosis not present

## 2020-05-15 DIAGNOSIS — K644 Residual hemorrhoidal skin tags: Secondary | ICD-10-CM | POA: Diagnosis not present

## 2020-05-15 DIAGNOSIS — K523 Indeterminate colitis: Secondary | ICD-10-CM | POA: Diagnosis not present

## 2020-05-15 DIAGNOSIS — E1165 Type 2 diabetes mellitus with hyperglycemia: Secondary | ICD-10-CM | POA: Diagnosis not present

## 2020-05-15 DIAGNOSIS — J449 Chronic obstructive pulmonary disease, unspecified: Secondary | ICD-10-CM | POA: Diagnosis not present

## 2020-05-15 DIAGNOSIS — K5900 Constipation, unspecified: Secondary | ICD-10-CM | POA: Diagnosis not present

## 2020-05-15 DIAGNOSIS — M159 Polyosteoarthritis, unspecified: Secondary | ICD-10-CM | POA: Diagnosis not present

## 2020-05-15 DIAGNOSIS — R109 Unspecified abdominal pain: Secondary | ICD-10-CM | POA: Diagnosis not present

## 2020-05-15 DIAGNOSIS — I10 Essential (primary) hypertension: Secondary | ICD-10-CM | POA: Diagnosis not present

## 2020-05-15 DIAGNOSIS — F419 Anxiety disorder, unspecified: Secondary | ICD-10-CM | POA: Diagnosis not present

## 2020-05-15 DIAGNOSIS — Z299 Encounter for prophylactic measures, unspecified: Secondary | ICD-10-CM | POA: Diagnosis not present

## 2020-05-15 DIAGNOSIS — K6289 Other specified diseases of anus and rectum: Secondary | ICD-10-CM | POA: Diagnosis not present

## 2020-05-15 DIAGNOSIS — R0902 Hypoxemia: Secondary | ICD-10-CM | POA: Diagnosis not present

## 2020-05-15 DIAGNOSIS — J9611 Chronic respiratory failure with hypoxia: Secondary | ICD-10-CM | POA: Diagnosis not present

## 2020-05-15 DIAGNOSIS — R2689 Other abnormalities of gait and mobility: Secondary | ICD-10-CM | POA: Diagnosis not present

## 2020-05-15 DIAGNOSIS — R41841 Cognitive communication deficit: Secondary | ICD-10-CM | POA: Diagnosis not present

## 2020-05-17 DIAGNOSIS — J9611 Chronic respiratory failure with hypoxia: Secondary | ICD-10-CM | POA: Diagnosis not present

## 2020-05-17 DIAGNOSIS — I25119 Atherosclerotic heart disease of native coronary artery with unspecified angina pectoris: Secondary | ICD-10-CM | POA: Diagnosis not present

## 2020-05-17 DIAGNOSIS — J449 Chronic obstructive pulmonary disease, unspecified: Secondary | ICD-10-CM | POA: Diagnosis not present

## 2020-05-17 DIAGNOSIS — E1165 Type 2 diabetes mellitus with hyperglycemia: Secondary | ICD-10-CM | POA: Diagnosis not present

## 2020-05-22 DIAGNOSIS — I10 Essential (primary) hypertension: Secondary | ICD-10-CM | POA: Diagnosis not present

## 2020-05-22 DIAGNOSIS — M159 Polyosteoarthritis, unspecified: Secondary | ICD-10-CM | POA: Diagnosis not present

## 2020-06-02 DIAGNOSIS — K5900 Constipation, unspecified: Secondary | ICD-10-CM | POA: Diagnosis not present

## 2020-06-02 DIAGNOSIS — I25119 Atherosclerotic heart disease of native coronary artery with unspecified angina pectoris: Secondary | ICD-10-CM | POA: Diagnosis not present

## 2020-06-02 DIAGNOSIS — J9611 Chronic respiratory failure with hypoxia: Secondary | ICD-10-CM | POA: Diagnosis not present

## 2020-06-02 DIAGNOSIS — Z299 Encounter for prophylactic measures, unspecified: Secondary | ICD-10-CM | POA: Diagnosis not present

## 2020-06-02 DIAGNOSIS — I10 Essential (primary) hypertension: Secondary | ICD-10-CM | POA: Diagnosis not present

## 2020-06-03 DIAGNOSIS — R2681 Unsteadiness on feet: Secondary | ICD-10-CM | POA: Diagnosis not present

## 2020-06-03 DIAGNOSIS — M6281 Muscle weakness (generalized): Secondary | ICD-10-CM | POA: Diagnosis not present

## 2020-06-03 DIAGNOSIS — R41841 Cognitive communication deficit: Secondary | ICD-10-CM | POA: Diagnosis not present

## 2020-06-03 DIAGNOSIS — I10 Essential (primary) hypertension: Secondary | ICD-10-CM | POA: Diagnosis not present

## 2020-06-03 DIAGNOSIS — J9611 Chronic respiratory failure with hypoxia: Secondary | ICD-10-CM | POA: Diagnosis not present

## 2020-06-03 DIAGNOSIS — F419 Anxiety disorder, unspecified: Secondary | ICD-10-CM | POA: Diagnosis not present

## 2020-06-03 DIAGNOSIS — I2510 Atherosclerotic heart disease of native coronary artery without angina pectoris: Secondary | ICD-10-CM | POA: Diagnosis not present

## 2020-06-03 DIAGNOSIS — K523 Indeterminate colitis: Secondary | ICD-10-CM | POA: Diagnosis not present

## 2020-06-03 DIAGNOSIS — E119 Type 2 diabetes mellitus without complications: Secondary | ICD-10-CM | POA: Diagnosis not present

## 2020-06-03 DIAGNOSIS — R109 Unspecified abdominal pain: Secondary | ICD-10-CM | POA: Diagnosis not present

## 2020-06-12 DIAGNOSIS — J9611 Chronic respiratory failure with hypoxia: Secondary | ICD-10-CM | POA: Diagnosis not present

## 2020-06-12 DIAGNOSIS — R109 Unspecified abdominal pain: Secondary | ICD-10-CM | POA: Diagnosis not present

## 2020-06-12 DIAGNOSIS — R41841 Cognitive communication deficit: Secondary | ICD-10-CM | POA: Diagnosis not present

## 2020-06-12 DIAGNOSIS — E119 Type 2 diabetes mellitus without complications: Secondary | ICD-10-CM | POA: Diagnosis not present

## 2020-06-15 DIAGNOSIS — R109 Unspecified abdominal pain: Secondary | ICD-10-CM | POA: Diagnosis not present

## 2020-06-15 DIAGNOSIS — I10 Essential (primary) hypertension: Secondary | ICD-10-CM | POA: Diagnosis not present

## 2020-06-15 DIAGNOSIS — Z09 Encounter for follow-up examination after completed treatment for conditions other than malignant neoplasm: Secondary | ICD-10-CM | POA: Diagnosis not present

## 2020-06-15 DIAGNOSIS — E1165 Type 2 diabetes mellitus with hyperglycemia: Secondary | ICD-10-CM | POA: Diagnosis not present

## 2020-06-18 ENCOUNTER — Other Ambulatory Visit: Payer: Self-pay

## 2020-06-18 ENCOUNTER — Encounter (INDEPENDENT_AMBULATORY_CARE_PROVIDER_SITE_OTHER): Payer: Self-pay | Admitting: Gastroenterology

## 2020-06-18 ENCOUNTER — Ambulatory Visit (INDEPENDENT_AMBULATORY_CARE_PROVIDER_SITE_OTHER): Payer: PPO | Admitting: Gastroenterology

## 2020-06-18 ENCOUNTER — Encounter (INDEPENDENT_AMBULATORY_CARE_PROVIDER_SITE_OTHER): Payer: Self-pay | Admitting: *Deleted

## 2020-06-18 VITALS — BP 132/72 | HR 109 | Temp 98.0°F | Ht 67.0 in | Wt 184.0 lb

## 2020-06-18 DIAGNOSIS — F40298 Other specified phobia: Secondary | ICD-10-CM | POA: Diagnosis not present

## 2020-06-18 DIAGNOSIS — R1032 Left lower quadrant pain: Secondary | ICD-10-CM | POA: Diagnosis not present

## 2020-06-18 DIAGNOSIS — R112 Nausea with vomiting, unspecified: Secondary | ICD-10-CM | POA: Diagnosis not present

## 2020-06-18 DIAGNOSIS — K760 Fatty (change of) liver, not elsewhere classified: Secondary | ICD-10-CM | POA: Diagnosis not present

## 2020-06-18 MED ORDER — HYOSCYAMINE SULFATE 0.125 MG SL SUBL: 0.1250 mg | SUBLINGUAL_TABLET | Freq: Four times a day (QID) | SUBLINGUAL | 0 refills | Status: DC | PRN

## 2020-06-18 NOTE — Patient Instructions (Addendum)
Schedule CT angio abdomen  with IV contrast Schedule GES Stop dicyclomine Start Levsin SL with meals Continue with Miralax daily RTC in 6 weeks

## 2020-06-18 NOTE — Progress Notes (Signed)
Maylon Peppers, M.D. Gastroenterology & Hepatology [facility] [address] [place], [place] [address]  Primary Care Physician: Glenda Chroman, MD [place] [address]  I will communicate my assessment and recommendations to the referring MD via EMR. "Note: Occasional unusual wording and randomly placed punctuation marks may result from the use of speech recognition technology to transcribe this document"  Problems: 1. Postprandial abdominal pain 2. Fatty liver  History of Present Illness: Heather [name] is a 86 y.o. female with past medical history of anxiety, GERD, hypertension, who presents for follow up after recent admission to the ER for abdominal pain and stool impaction.  The patient went to the ER at [facility] on 05/09/2020 due to abdominal pain and constipation.  The patient underwent a CT of the abdomen with IV contrast she was found to have sick centimeters stool ball in the rectum with minimal edema but suspicious for impaction and stercoral colitis.  There was also presence of changes suggestive of mild cirrhosis given nodularity of the liver.  During this admission the patient was found to have elevation of her liver enzymes with initial ALT of 100, AST 62, normal total bilirubin 0.5, albumin 3.7 and alkaline phosphatase 81.  Particularly, the next day her ALT and AST decreased to the low 30s.  Her CBC during her hospitalization showed leukocytosis of 18,000 with normal platelet count of 219 and hemoglobin of 12.5.  Patient received antibiotic treatment and also got tap water enemas with resolution of her symptomatology.  She reports that after she was discharged from the hospital her bowel movement frequency normalized to 1 bowel movement daily for which she is taking MiraLAX 1 cap every day.  Patient was last seen in clinic on 02/10/2020.  She was having episodes of constipation that were initially treated with  Linzess.  She was also found to have elevated aminotransferases at that time but no stigmata of chronic liver disease, findings likely related to NASH.  She was scheduled for an EGD as she was presenting chronic symptoms of incomplete emptying and discomfort in her abdomen, EGD on 04/09/2020 showed normal esophagus and duodenum, with presence of gastric erythema.  Biopsies were negative for H. pylori or any other alterations.  Patient was given recommendation to take FDgard but she is not taking medication.  The patient states that she is very concerned as she has had recurrent abdominal in both flanks for the last year.  She actually describes that she does not have any night symptoms or when sleeping but her pain is only elicited after eating any kind of food.  She does not recognize a specific food trigger that leads to her pain but she reported the pain is severe and does not radiate anywhere else, described as cramping.  She reported initially the pain last 2 hours after having her meal.  She has only been taking dicyclomine twice daily since last Tuesday per recommendation of her PCP without any significant improvement.  She states that she has lost close to 25 pounds since her hospitalization.  The patient denies having any vomiting but has had some nausea when her pain is severe.  Denies fever, chills, hematochezia, melena, hematemesis, abdominal distention, diarrhea, jaundice, pruritus.   Past Medical History: Past Medical History:  Diagnosis Date  . Anxiety   . GERD (gastroesophageal reflux disease)   . Hx of shoulder surgery    Left Shoulder - pitched nerve.  . Other and unspecified  hyperlipidemia   . Unspecified essential hypertension     Past Surgical History: Past Surgical History:  Procedure Laterality Date  . BIOPSY  04/09/2020   Procedure: BIOPSY;  Surgeon: Rogene Houston, MD;  [facility];  Service: Endoscopy;;  . CERVICAL DISC SURGERY    . CESAREAN SECTION  1970   . ESOPHAGOGASTRODUODENOSCOPY N/A 04/09/2020   Procedure: ESOPHAGOGASTRODUODENOSCOPY (EGD);  Surgeon: Rogene Houston, MD;  [facility];  Service: Endoscopy;  Laterality: N/A;  1030  . History of shoulder surgery Left    pitched nerve.    Family History: Family History  Problem Relation Age of Onset  . Hypertension Other   . Heart attack Sister     Social History: Social History   Tobacco Use  Smoking Status Never Smoker  Smokeless Tobacco Never Used   Social History   Substance and Sexual Activity  Alcohol Use No  . Alcohol/week: 0.0 standard drinks   Social History   Substance and Sexual Activity  Drug Use No    Allergies: No Known Allergies  Medications: Current Outpatient Medications  Medication Sig Dispense Refill  . albuterol (VENTOLIN HFA) 108 (90 Base) MCG/ACT inhaler Inhale 2 puffs into the lungs every 4 (four) hours as needed for wheezing or shortness of breath.     . ALPRAZolam (XANAX) 1 MG tablet Take 1 mg by mouth 2 (two) times daily.     Marland Kitchen amLODipine (NORVASC) 10 MG tablet Take 10 mg by mouth every morning.   12  . Ascorbic Acid (VITAMIN C WITH ROSE HIPS) 500 MG tablet Take 500 mg by mouth daily.    Marland Kitchen aspirin 81 MG tablet Take 2 tablets (162 mg total) by mouth in the morning and at bedtime. 30 tablet   . Calcium-Magnesium-Zinc (CAL-MAG-ZINC PO) Take 1 tablet by mouth daily.    . chlorthalidone (HYGROTON) 25 MG tablet Take by mouth.    . dicyclomine (BENTYL) 10 MG capsule Take 10 mg by mouth 2 (two) times daily.    . ferrous sulfate 325 (65 FE) MG tablet Take 325 mg by mouth daily with breakfast.    . furosemide (LASIX) 40 MG tablet Take 1 tablet (40 mg total) by mouth as needed. (Patient taking differently: Take 40 mg by mouth daily as needed for fluid. ) 90 tablet 3  . isosorbide mononitrate (IMDUR) 30 MG 24 hr tablet Take 2 tablets (60 mg total) by mouth daily. 180 tablet 3  . metoprolol tartrate (LOPRESSOR) 25 MG tablet Take 25 mg by  mouth 2 (two) times daily.  3  . Naphazoline-Pheniramine (OPCON-A) 0.027-0.315 % SOLN Place 1 drop into both eyes 2 (two) times daily as needed (dry eyes).    . nitroGLYCERIN (NITROSTAT) 0.4 MG SL tablet Place 0.4 mg under the tongue every 5 (five) minutes as needed for chest pain.    Marland Kitchen omeprazole (PRILOSEC) 40 MG capsule Take 40 mg by mouth daily.    . Polyethylene Glycol 3350 (MIRALAX PO) Take by mouth.    . Prucalopride Succinate (MOTEGRITY) 1 MG TABS Take 1 mg by mouth daily with breakfast. 30 tablet 5  . VITAMIN D PO Take 1 capsule by mouth daily.     . hyoscyamine (LEVSIN SL) 0.125 MG SL tablet Place 1 tablet (0.125 mg total) under the tongue every 6 (six) hours as needed for cramping. 30 tablet 0   No current facility-administered medications for this visit.    Review of Systems: GENERAL: negative for malaise, night  sweats HEENT: No changes in hearing or vision, no nose bleeds or other nasal problems. NECK: Negative for lumps, goiter, pain and significant neck swelling RESPIRATORY: Negative for cough, wheezing CARDIOVASCULAR: Negative for chest pain, leg swelling, palpitations, orthopnea GI: SEE HPI MUSCULOSKELETAL: Negative for joint pain or swelling, back pain, and muscle pain. SKIN: Negative for lesions, rash PSYCH: Negative for sleep disturbance, mood disorder and recent psychosocial stressors. HEMATOLOGY Negative for prolonged bleeding, bruising easily, and swollen nodes. ENDOCRINE: Negative for cold or heat intolerance, polyuria, polydipsia and goiter. NEURO: negative for tremor, gait imbalance, syncope and seizures. The remainder of the review of systems is noncontributory.   Physical Exam: BP 132/72 (BP Location: Left Arm, Patient Position: Sitting, Cuff Size: Normal)   Pulse (!) 109   Temp 98 F (36.7 C) (Oral)   Ht 5\' 7"  (1.702 m)   Wt 184 lb (83.5 kg)   BMI 28.82 kg/m  GENERAL: The patient is AO x3, in no acute distress. Uses roller aide. Looks frail. HEENT:  Head is normocephalic and atraumatic. EOMI are intact. Mouth is well hydrated and without lesions. NECK: Supple. No masses LUNGS: Clear to auscultation. No presence of rhonchi/wheezing/rales. Adequate chest expansion HEART: RRR, normal s1 and s2. ABDOMEN: mildly tender to palpation in lower abdominal area upon deep palpation, no guarding, no peritoneal signs, and nondistended. BS +. No masses. EXTREMITIES: Without any cyanosis, clubbing, rash, lesions or edema. NEUROLOGIC: AOx3, no focal motor deficit. SKIN: no jaundice, no rashes  Imaging/Labs: as above  I personally reviewed and interpreted the available labs, imaging and endoscopic files.  Impression and Plan: Soma Bachand is a 86 y.o. female with past medical history of anxiety, GERD, hypertension, who presents for follow up after recent admission to the ER for abdominal pain and stool impaction.  The patient has had chronic complaints of constipation which were recently complicated by episodes of stercoral colitis, however this seem to have clinically resolved with the intake of MiraLAX.  However, she has presented persistent abdominal pain which particularly is elicited with food intake and presence of sitophobia, raising concern for possible intestinal angina.  Due to this, we will need to proceed with a CTA of the abdomen to rule out any vascular compromise.  On the other hand she has had episodes of nausea and upper abdominal discomfort which will need further evaluation with gastric emptying study as previously considered.  The patient may benefit from now switching to sublingual Levsin with each meal instead of taking dicyclomine.  Finally, the patient had elevation in her liver enzymes which could be related to NASH.  We will recheck her LFTs today.  Patient was advised to continue losing weight as a measure to improve her fatty liver.  - Schedule CT angio abdomen  with IV contrast - Schedule GES - Stop dicyclomine and start  Levsin SL with meals - Continue with Miralax daily - Check CMP - Patient encouraged to lose weight as this may provide benefit for her specific complaint but also due to other comorbidities - RTC in 6 weeks  All questions were answered.      Harvel Quale, MD Gastroenterology and Hepatology [facility]

## 2020-06-19 ENCOUNTER — Telehealth (INDEPENDENT_AMBULATORY_CARE_PROVIDER_SITE_OTHER): Payer: Self-pay | Admitting: Gastroenterology

## 2020-06-19 LAB — COMPREHENSIVE METABOLIC PANEL
AG Ratio: 1.5 (calc) (ref 1.0–2.5)
ALT: 37 U/L — ABNORMAL HIGH (ref 6–29)
AST: 46 U/L — ABNORMAL HIGH (ref 10–35)
Albumin: 4.7 g/dL (ref 3.6–5.1)
Alkaline phosphatase (APISO): 54 U/L (ref 37–153)
BUN/Creatinine Ratio: 18 (calc) (ref 6–22)
BUN: 18 mg/dL (ref 7–25)
CO2: 27 mmol/L (ref 20–32)
Calcium: 10.3 mg/dL (ref 8.6–10.4)
Chloride: 96 mmol/L — ABNORMAL LOW (ref 98–110)
Creat: 1.01 mg/dL — ABNORMAL HIGH (ref 0.60–0.88)
Globulin: 3.2 g/dL (calc) (ref 1.9–3.7)
Glucose, Bld: 109 mg/dL (ref 65–139)
Potassium: 3.3 mmol/L — ABNORMAL LOW (ref 3.5–5.3)
Sodium: 139 mmol/L (ref 135–146)
Total Bilirubin: 0.6 mg/dL (ref 0.2–1.2)
Total Protein: 7.9 g/dL (ref 6.1–8.1)

## 2020-06-19 NOTE — Telephone Encounter (Signed)
I called the patient to inform her about the results of her recent blood testing which showed  Very mild elevation of aminotransferases but better compared to prior. Patient will benefit from losing more weight.  The patient understood and agreed.  Maylon Peppers, MD Gastroenterology and Hepatology [facility]

## 2020-06-23 DIAGNOSIS — I10 Essential (primary) hypertension: Secondary | ICD-10-CM | POA: Diagnosis not present

## 2020-06-25 ENCOUNTER — Encounter (HOSPITAL_COMMUNITY)
Admission: RE | Admit: 2020-06-25 | Discharge: 2020-06-25 | Disposition: A | Discharge status: Home / Self Care | Payer: PPO | Source: Ambulatory Visit | Attending: Gastroenterology | Admitting: Gastroenterology

## 2020-06-25 ENCOUNTER — Encounter (HOSPITAL_COMMUNITY): Payer: Self-pay

## 2020-06-25 ENCOUNTER — Other Ambulatory Visit: Payer: Self-pay

## 2020-06-25 DIAGNOSIS — R6881 Early satiety: Secondary | ICD-10-CM | POA: Diagnosis not present

## 2020-06-25 DIAGNOSIS — R109 Unspecified abdominal pain: Secondary | ICD-10-CM | POA: Diagnosis not present

## 2020-06-25 DIAGNOSIS — R112 Nausea with vomiting, unspecified: Secondary | ICD-10-CM | POA: Diagnosis not present

## 2020-06-25 MED ORDER — TECHNETIUM TC 99M SULFUR COLLOID
2.0000 | Freq: Once | INTRAVENOUS | Status: AC
  Administered 2020-06-25: 2.1 via ORAL

## 2020-06-25 MED ORDER — TECHNETIUM TC 99M SULFUR COLLOID: 2.0000 | Freq: Once | INTRAVENOUS | Status: DC

## 2020-06-26 DIAGNOSIS — J9611 Chronic respiratory failure with hypoxia: Secondary | ICD-10-CM | POA: Diagnosis not present

## 2020-06-26 DIAGNOSIS — F419 Anxiety disorder, unspecified: Secondary | ICD-10-CM | POA: Diagnosis not present

## 2020-06-26 DIAGNOSIS — K523 Indeterminate colitis: Secondary | ICD-10-CM | POA: Diagnosis not present

## 2020-06-26 DIAGNOSIS — R109 Unspecified abdominal pain: Secondary | ICD-10-CM | POA: Diagnosis not present

## 2020-06-26 DIAGNOSIS — M6281 Muscle weakness (generalized): Secondary | ICD-10-CM | POA: Diagnosis not present

## 2020-06-26 DIAGNOSIS — I2510 Atherosclerotic heart disease of native coronary artery without angina pectoris: Secondary | ICD-10-CM | POA: Diagnosis not present

## 2020-06-26 DIAGNOSIS — E119 Type 2 diabetes mellitus without complications: Secondary | ICD-10-CM | POA: Diagnosis not present

## 2020-06-26 DIAGNOSIS — R41841 Cognitive communication deficit: Secondary | ICD-10-CM | POA: Diagnosis not present

## 2020-06-26 DIAGNOSIS — I10 Essential (primary) hypertension: Secondary | ICD-10-CM | POA: Diagnosis not present

## 2020-06-26 DIAGNOSIS — R2681 Unsteadiness on feet: Secondary | ICD-10-CM | POA: Diagnosis not present

## 2020-07-08 ENCOUNTER — Telehealth (INDEPENDENT_AMBULATORY_CARE_PROVIDER_SITE_OTHER): Payer: Self-pay | Admitting: Gastroenterology

## 2020-07-08 ENCOUNTER — Ambulatory Visit (HOSPITAL_COMMUNITY)
Admission: RE | Admit: 2020-07-08 | Discharge: 2020-07-08 | Disposition: A | Discharge status: Home / Self Care | Payer: PPO | Source: Ambulatory Visit | Attending: Gastroenterology | Admitting: Gastroenterology

## 2020-07-08 ENCOUNTER — Other Ambulatory Visit (INDEPENDENT_AMBULATORY_CARE_PROVIDER_SITE_OTHER): Payer: Self-pay

## 2020-07-08 ENCOUNTER — Other Ambulatory Visit: Payer: Self-pay

## 2020-07-08 ENCOUNTER — Encounter (INDEPENDENT_AMBULATORY_CARE_PROVIDER_SITE_OTHER): Payer: Self-pay

## 2020-07-08 DIAGNOSIS — F40298 Other specified phobia: Secondary | ICD-10-CM | POA: Insufficient documentation

## 2020-07-08 DIAGNOSIS — I700 Atherosclerosis of aorta: Secondary | ICD-10-CM | POA: Diagnosis not present

## 2020-07-08 DIAGNOSIS — K551 Chronic vascular disorders of intestine: Secondary | ICD-10-CM | POA: Diagnosis not present

## 2020-07-08 DIAGNOSIS — I701 Atherosclerosis of renal artery: Secondary | ICD-10-CM | POA: Diagnosis not present

## 2020-07-08 DIAGNOSIS — R1032 Left lower quadrant pain: Secondary | ICD-10-CM | POA: Diagnosis not present

## 2020-07-08 DIAGNOSIS — N289 Disorder of kidney and ureter, unspecified: Secondary | ICD-10-CM | POA: Diagnosis not present

## 2020-07-08 MED ORDER — HYOSCYAMINE SULFATE 0.125 MG SL SUBL: 0.1250 mg | SUBLINGUAL_TABLET | Freq: Four times a day (QID) | SUBLINGUAL | 0 refills | Status: DC | PRN

## 2020-07-08 MED ORDER — IOHEXOL 350 MG/ML SOLN
100.0000 mL | Freq: Once | INTRAVENOUS | Status: AC | PRN
  Administered 2020-07-08: 100 mL via INTRAVENOUS

## 2020-07-08 NOTE — Telephone Encounter (Signed)
I spoke with the patient and her daughter regarding results of the abdominal CTA which showed mild atherosclerotic changes in her abdominal vasculature but no presence of major obstructions that would be consistent with a diagnosis of chronic mesenteric ischemia.  The patient has had negative EGD and abdominal scan in the past, also had a normal gastric emptying study.  She reports that she has presented recurrent bloating that is causing her significant discomfort but no real abdominal pain.  She is having 1 bowel movement per day.  Given these alterations, I consider that the patient has symptoms consistent with IBS/bowel hypersensitivity, for which I recommended them to take FDGard up to 3 times per day to improve her bloating sensation, as she has not felt any improvement with the use of Bentyl.  On the other hand, she was counseled to implement a low FODMAP diet.  Both understood and agreed.  They will follow-up in the clinic.  Maylon Peppers, MD Gastroenterology and Hepatology [facility]

## 2020-07-23 DIAGNOSIS — I10 Essential (primary) hypertension: Secondary | ICD-10-CM | POA: Diagnosis not present

## 2020-07-23 DIAGNOSIS — M159 Polyosteoarthritis, unspecified: Secondary | ICD-10-CM | POA: Diagnosis not present

## 2020-08-13 DIAGNOSIS — J449 Chronic obstructive pulmonary disease, unspecified: Secondary | ICD-10-CM | POA: Diagnosis not present

## 2020-08-13 DIAGNOSIS — Z299 Encounter for prophylactic measures, unspecified: Secondary | ICD-10-CM | POA: Diagnosis not present

## 2020-08-13 DIAGNOSIS — I10 Essential (primary) hypertension: Secondary | ICD-10-CM | POA: Diagnosis not present

## 2020-08-13 DIAGNOSIS — F419 Anxiety disorder, unspecified: Secondary | ICD-10-CM | POA: Diagnosis not present

## 2020-08-13 DIAGNOSIS — Z79899 Other long term (current) drug therapy: Secondary | ICD-10-CM | POA: Diagnosis not present

## 2020-08-13 DIAGNOSIS — E1165 Type 2 diabetes mellitus with hyperglycemia: Secondary | ICD-10-CM | POA: Diagnosis not present

## 2020-08-17 ENCOUNTER — Other Ambulatory Visit: Payer: Self-pay

## 2020-08-17 ENCOUNTER — Encounter (INDEPENDENT_AMBULATORY_CARE_PROVIDER_SITE_OTHER): Payer: Self-pay | Admitting: Gastroenterology

## 2020-08-17 ENCOUNTER — Ambulatory Visit (INDEPENDENT_AMBULATORY_CARE_PROVIDER_SITE_OTHER): Payer: PPO | Admitting: Gastroenterology

## 2020-08-17 ENCOUNTER — Telehealth (INDEPENDENT_AMBULATORY_CARE_PROVIDER_SITE_OTHER): Payer: Self-pay

## 2020-08-17 VITALS — BP 130/72 | HR 86 | Temp 99.3°F | Ht 67.0 in | Wt 185.8 lb

## 2020-08-17 DIAGNOSIS — R1084 Generalized abdominal pain: Secondary | ICD-10-CM

## 2020-08-17 DIAGNOSIS — K219 Gastro-esophageal reflux disease without esophagitis: Secondary | ICD-10-CM | POA: Diagnosis not present

## 2020-08-17 DIAGNOSIS — K581 Irritable bowel syndrome with constipation: Secondary | ICD-10-CM

## 2020-08-17 DIAGNOSIS — R7989 Other specified abnormal findings of blood chemistry: Secondary | ICD-10-CM

## 2020-08-17 NOTE — Progress Notes (Signed)
Patient profile: Thaila Bottoms is a 86 y.o. female seen for f/up of abd pain . Last seen in clinic on by Dr. Jenetta Downer on 06/18/20  History of Present Illness: Bianna Haran is seen today for evaluation.  She was last seen by Dr. Jenetta Downer in August, since that visit she has had a gastric emptying scan and a CT angiography  She is seen today accompanied by a family member who helps with history.  She reports she continues to have some discomfort in her epigastric area, this most notably occurs after meals and is worse if she does not move her stools well.  She reports epigastric discomfort can radiate to her sides and to bilateral lower abd. She feels relief of the pain when she is able to have a stool.  She has had a chronic problem with constipation, she was given some samples of Linzess which made her bowels move but her insurance did not cover.  She is currently taking MiraLAX once a day.  She is unsure if she is taking Motegrity on her medicine list. Unsure if tried FD guard. Takes Levsin PRN and this is not in bubble pack meds but rather unclear if helps significantly or how often take. Hoping to decrease xanax dose from 3x/day to 1-2x/day   She feels her bowels can move every couple days.  She had the pain this morning and her last bowel movement was 2 days ago.  She did move the stool twice this morning and her pain improved.  She has occasional nausea but no vomiting.  She is on omeprazole 40 mg to get some breakthrough heartburn and uses Tums as needed.  No dysphagia. Denies any rectal bleeding or melena.   We discussed her weight loss as below.  She has started increased dose of Lasix and feels a lot of the weight is fluid related.  She had to start potassium due to taking her Lasix recently. Repeat labs at pcp are pending.   Wt Readings from Last 3 Encounters:  08/17/20 185 lb 12.8 oz (84.3 kg)  06/18/20 184 lb (83.5 kg)  04/09/20 199 lb (90.3 kg)     Last Colonoscopy: Dr  Britta Mccreedy 2013 -diverticulosis in sigmoid, 2 small polyps    Last Endoscopy: 03/2020- Normal hypopharynx. - Normal esophagus.  - Z-line irregular, 38 cm from the incisors.  - Erythematous mucosa in the antrum and prepyloric region of the stomach. Biopsied.   - Normal duodenal bulb and second portion of the duodenum     Past Medical History:  Past Medical History:  Diagnosis Date  . Anxiety   . GERD (gastroesophageal reflux disease)   . Hx of shoulder surgery    Left Shoulder - pitched nerve.  . Other and unspecified hyperlipidemia   . Unspecified essential hypertension     Problem List: Patient Active Problem List   Diagnosis Date Noted  . Nausea with vomiting 06/18/2020  . Anorexia 11/11/2019  . Abdominal pain 08/12/2019  . Constipation 08/12/2019  . Rash and nonspecific skin eruption 08/12/2019  . Elevated LFTs 08/12/2019  . Carpal tunnel syndrome, right upper limb 08/31/2018  . Fatty liver 06/09/2018  . Essential hypertension 06/09/2018  . Anxiety 06/09/2018  . GERD (gastroesophageal reflux disease) 06/09/2018  . Palpitations 04/22/2012  . Chest pain 04/22/2012  . Coronary artery disease excluded 04/22/2012  . Insomnia 04/22/2012    Past Surgical History: Past Surgical History:  Procedure Laterality Date  . BIOPSY  04/09/2020   Procedure: BIOPSY;  Surgeon: Rogene Houston, MD;  [facility];  Service: Endoscopy;;  . CERVICAL DISC SURGERY    . CESAREAN SECTION  1970  . ESOPHAGOGASTRODUODENOSCOPY N/A 04/09/2020   Procedure: ESOPHAGOGASTRODUODENOSCOPY (EGD);  Surgeon: Rogene Houston, MD;  [facility];  Service: Endoscopy;  Laterality: N/A;  1030  . History of shoulder surgery Left    pitched nerve.    Allergies: No Known Allergies    Home Medications:  Current Outpatient Medications:  .  albuterol (VENTOLIN HFA) 108 (90 Base) MCG/ACT inhaler, Inhale 2 puffs into the lungs every 4 (four) hours as needed for wheezing or shortness of  breath. , Disp: , Rfl:  .  ALPRAZolam (XANAX) 1 MG tablet, Take 1 mg by mouth 2 (two) times daily. , Disp: , Rfl:  .  amLODipine (NORVASC) 10 MG tablet, Take 10 mg by mouth every morning. , Disp: , Rfl: 12 .  Ascorbic Acid (VITAMIN C WITH ROSE HIPS) 500 MG tablet, Take 500 mg by mouth daily., Disp: , Rfl:  .  aspirin 81 MG tablet, Take 2 tablets (162 mg total) by mouth in the morning and at bedtime., Disp: 30 tablet, Rfl:  .  Calcium-Magnesium-Zinc (CAL-MAG-ZINC PO), Take 1 tablet by mouth daily., Disp: , Rfl:  .  chlorthalidone (HYGROTON) 25 MG tablet, Take by mouth., Disp: , Rfl:  .  ferrous sulfate 325 (65 FE) MG tablet, Take 325 mg by mouth daily with breakfast., Disp: , Rfl:  .  furosemide (LASIX) 40 MG tablet, Take 40 mg by mouth daily., Disp: , Rfl:  .  hyoscyamine (LEVSIN SL) 0.125 MG SL tablet, Place 1 tablet (0.125 mg total) under the tongue every 6 (six) hours as needed for cramping., Disp: 30 tablet, Rfl: 0 .  metoprolol tartrate (LOPRESSOR) 25 MG tablet, Take 25 mg by mouth 2 (two) times daily., Disp: , Rfl: 3 .  Naphazoline-Pheniramine (OPCON-A) 0.027-0.315 % SOLN, Place 1 drop into both eyes 2 (two) times daily as needed (dry eyes)., Disp: , Rfl:  .  nitroGLYCERIN (NITROSTAT) 0.4 MG SL tablet, Place 0.4 mg under the tongue every 5 (five) minutes as needed for chest pain., Disp: , Rfl:  .  omeprazole (PRILOSEC) 40 MG capsule, Take 40 mg by mouth daily., Disp: , Rfl:  .  Polyethylene Glycol 3350 (MIRALAX PO), Take by mouth., Disp: , Rfl:  .  potassium chloride (KLOR-CON) 20 MEQ packet, Take 20 mEq by mouth daily., Disp: , Rfl:  .  Prucalopride Succinate (MOTEGRITY) 1 MG TABS, Take 1 mg by mouth daily with breakfast., Disp: 30 tablet, Rfl: 5 .  VITAMIN D PO, Take 1 capsule by mouth daily. , Disp: , Rfl:  .  furosemide (LASIX) 40 MG tablet, Take 1 tablet (40 mg total) by mouth as needed. (Patient taking differently: Take 40 mg by mouth daily as needed for fluid. ), Disp: 90 tablet, Rfl:  3 .  isosorbide mononitrate (IMDUR) 30 MG 24 hr tablet, Take 2 tablets (60 mg total) by mouth daily., Disp: 180 tablet, Rfl: 3   Family History: family history includes Heart attack in her sister; Hypertension in an other family member.    Social History:   reports that she has never smoked. She has never used smokeless tobacco. She reports that she does not drink alcohol and does not use drugs.   Review of Systems: Constitutional: Denies weight loss/weight gain  Eyes: No changes in vision. ENT: No oral lesions, sore throat.  GI: see HPI.  Heme/Lymph: No easy bruising.  CV: No chest pain.  GU: No hematuria.  Integumentary: No rashes.  Neuro: No headaches.  Psych: No depression/anxiety.  Endocrine: No heat/cold intolerance.  Allergic/Immunologic: No urticaria.  Resp: No cough, SOB.  Musculoskeletal: No joint swelling.    Physical Examination: BP 130/72 (BP Location: Right Arm, Patient Position: Sitting, Cuff Size: Large)   Pulse 86   Temp 99.3 F (37.4 C) (Oral)   Ht 5\' 7"  (1.702 m)   Wt 185 lb 12.8 oz (84.3 kg)   BMI 29.10 kg/m  Gen: NAD, alert and oriented x 4. Uses walker for assistance. Overweight.  HEENT: PEERLA, EOMI, Neck: supple, no JVD Chest: CTA bilaterally, no wheezes, crackles, or other adventitious sounds CV: RRR, no m/g/c/r Abd: soft, NT, ND, +BS in all four quadrants; no HSM, guarding, ridigity, or rebound tenderness Ext: no lower extremity noted Skin: no rash or lesions noted on observed skin Lymph: no noted LAD  Data Reviewed:  CT angio 07/08/20-IMPRESSION: The CT angiogram does not support chronic mesenteric ischemia as a source of the patient's abdominal pain, as the mesenteric arterial disease appears only mild without high-grade stenosis. No acute CT finding to account for abdominal pain. Bilateral renal artery disease without high-grade stenosis. There is estimated 50% narrowing of the proximal right renal artery Aortic Atherosclerosis  (ICD10-I70.0) Relatively atrophic pancreas otherwise unremarkable  GES 06/25/20-24% emptied at 1 hr ( normal >= 10%) 46% emptied at 2 hr ( normal >= 40%) 68% emptied at 3 hr ( normal >= 70%) 90% emptied at 4 hr ( normal >= 90% IMPRESSION: Normal gastric emptying study.     Assessment/Plan: Ms. Luse is a 86 y.o. female seen for follow-up of postprandial abdominal pain  1.  Abdominal pain-has been evaluated with CT scan at [facility] July 2021-showed impaction and stercoral colitis.  She had further evaluation with CT angiography and gastric emptying scan given specific postprandial component as above.  She does seem to relate pain drastically improving after she has a bowel movement and pain being more significant when not moving stools well.  Linzess was not covered by her insurance.  She is not sure if she is taking Motegrity or FD Guard.  We will have her increase her dosing of MiraLAX to 1 dose each morning and a second dose in the evening if does not help with bowel movement that day.  She will call and let me know she is taking Motegrity or FD Guard.  Her family feels she does better if she is able to ambulate more and encouraged her to ambulate as tolerated to help bowel motility.  We also discussed increasing her fluid intake.     She does feel the Levsin helps the pain some and this will be refilled today.  She will follow-up in 6 weeks to assess response to medication adjustment.   2. GERD - continue PPI. She supplements w/ Tums as needed  3. Early changes of cirrhosis on CT scan - LFTS minimally elevated. EGD w/o varices. No ascites. Minimally elevated LFTS. Will follow at her advanced age.  Kieryn was seen today for 2 month follow up.  Diagnoses and all orders for this visit:  Generalized abdominal pain  Chronic GERD    6 week f/up with Dr Jenetta Downer scheduled   I personally performed the service, non-incident to. (WP)  Laurine Blazer, [facility]

## 2020-08-17 NOTE — Telephone Encounter (Signed)
Heather [name] is taking a Rx Laxative and does need a refill and is on Potassium 53mcg

## 2020-08-17 NOTE — Patient Instructions (Addendum)
-  Take miralax once a day each morning, if you do not have a bowel movement that day please take a 2nd dose in evening.  -Use Levsin as needed for abd pain (refilled) -6 weeks follow up    Please call and let me know if taking Prucalopride Succinate (MOTEGRITY)

## 2020-08-18 ENCOUNTER — Ambulatory Visit (INDEPENDENT_AMBULATORY_CARE_PROVIDER_SITE_OTHER): Payer: PPO | Admitting: Internal Medicine

## 2020-08-20 ENCOUNTER — Other Ambulatory Visit (INDEPENDENT_AMBULATORY_CARE_PROVIDER_SITE_OTHER): Payer: Self-pay | Admitting: Gastroenterology

## 2020-08-20 DIAGNOSIS — K589 Irritable bowel syndrome without diarrhea: Secondary | ICD-10-CM | POA: Insufficient documentation

## 2020-08-20 DIAGNOSIS — R1032 Left lower quadrant pain: Secondary | ICD-10-CM

## 2020-08-20 NOTE — Telephone Encounter (Signed)
Last seen Laurine Blazer on 08/17/2020 for abdominal pain.

## 2020-08-21 DIAGNOSIS — E876 Hypokalemia: Secondary | ICD-10-CM | POA: Diagnosis not present

## 2020-08-22 DIAGNOSIS — I10 Essential (primary) hypertension: Secondary | ICD-10-CM | POA: Diagnosis not present

## 2020-08-25 DIAGNOSIS — Z299 Encounter for prophylactic measures, unspecified: Secondary | ICD-10-CM | POA: Diagnosis not present

## 2020-08-25 DIAGNOSIS — D485 Neoplasm of uncertain behavior of skin: Secondary | ICD-10-CM | POA: Diagnosis not present

## 2020-08-25 DIAGNOSIS — I10 Essential (primary) hypertension: Secondary | ICD-10-CM | POA: Diagnosis not present

## 2020-08-25 DIAGNOSIS — L820 Inflamed seborrheic keratosis: Secondary | ICD-10-CM | POA: Diagnosis not present

## 2020-08-27 NOTE — Telephone Encounter (Signed)
Hibberd daughter is aware of the recommendation and states that she has purchased the Wheeler and she is taking it

## 2020-08-27 NOTE — Telephone Encounter (Signed)
Please notify patient she can continue current regimen if she is doing well.  I would like her to bring her actual medication bottles to next appointment.  Dr. Jenetta Downer also recommended FD guard which she can purchase over-the-counter and she should try prior to her follow-up appt in December.

## 2020-09-07 DIAGNOSIS — M1711 Unilateral primary osteoarthritis, right knee: Secondary | ICD-10-CM | POA: Diagnosis not present

## 2020-09-22 DIAGNOSIS — M159 Polyosteoarthritis, unspecified: Secondary | ICD-10-CM | POA: Diagnosis not present

## 2020-09-22 DIAGNOSIS — I10 Essential (primary) hypertension: Secondary | ICD-10-CM | POA: Diagnosis not present

## 2020-09-28 ENCOUNTER — Ambulatory Visit (INDEPENDENT_AMBULATORY_CARE_PROVIDER_SITE_OTHER): Payer: PPO | Admitting: Gastroenterology

## 2020-10-01 DIAGNOSIS — D485 Neoplasm of uncertain behavior of skin: Secondary | ICD-10-CM | POA: Diagnosis not present

## 2020-10-01 DIAGNOSIS — L820 Inflamed seborrheic keratosis: Secondary | ICD-10-CM | POA: Diagnosis not present

## 2020-10-01 DIAGNOSIS — I10 Essential (primary) hypertension: Secondary | ICD-10-CM | POA: Diagnosis not present

## 2020-10-01 DIAGNOSIS — Z299 Encounter for prophylactic measures, unspecified: Secondary | ICD-10-CM | POA: Diagnosis not present

## 2020-10-22 DIAGNOSIS — Z789 Other specified health status: Secondary | ICD-10-CM | POA: Diagnosis not present

## 2020-10-22 DIAGNOSIS — E7800 Pure hypercholesterolemia, unspecified: Secondary | ICD-10-CM | POA: Diagnosis not present

## 2020-10-22 DIAGNOSIS — Z6830 Body mass index (BMI) 30.0-30.9, adult: Secondary | ICD-10-CM | POA: Diagnosis not present

## 2020-10-22 DIAGNOSIS — Z299 Encounter for prophylactic measures, unspecified: Secondary | ICD-10-CM | POA: Diagnosis not present

## 2020-10-22 DIAGNOSIS — Z1331 Encounter for screening for depression: Secondary | ICD-10-CM | POA: Diagnosis not present

## 2020-10-22 DIAGNOSIS — Z1339 Encounter for screening examination for other mental health and behavioral disorders: Secondary | ICD-10-CM | POA: Diagnosis not present

## 2020-10-22 DIAGNOSIS — M159 Polyosteoarthritis, unspecified: Secondary | ICD-10-CM | POA: Diagnosis not present

## 2020-10-22 DIAGNOSIS — Z79899 Other long term (current) drug therapy: Secondary | ICD-10-CM | POA: Diagnosis not present

## 2020-10-22 DIAGNOSIS — Z7189 Other specified counseling: Secondary | ICD-10-CM | POA: Diagnosis not present

## 2020-10-22 DIAGNOSIS — R5383 Other fatigue: Secondary | ICD-10-CM | POA: Diagnosis not present

## 2020-10-22 DIAGNOSIS — Z0000 Encounter for general adult medical examination without abnormal findings: Secondary | ICD-10-CM | POA: Diagnosis not present

## 2020-10-22 DIAGNOSIS — F419 Anxiety disorder, unspecified: Secondary | ICD-10-CM | POA: Diagnosis not present

## 2020-10-22 DIAGNOSIS — I10 Essential (primary) hypertension: Secondary | ICD-10-CM | POA: Diagnosis not present

## 2020-10-28 DIAGNOSIS — I10 Essential (primary) hypertension: Secondary | ICD-10-CM | POA: Diagnosis not present

## 2020-10-28 DIAGNOSIS — M25561 Pain in right knee: Secondary | ICD-10-CM | POA: Diagnosis not present

## 2020-10-28 DIAGNOSIS — Z299 Encounter for prophylactic measures, unspecified: Secondary | ICD-10-CM | POA: Diagnosis not present

## 2020-10-28 DIAGNOSIS — F419 Anxiety disorder, unspecified: Secondary | ICD-10-CM | POA: Diagnosis not present

## 2020-10-28 DIAGNOSIS — Z789 Other specified health status: Secondary | ICD-10-CM | POA: Diagnosis not present

## 2020-10-28 DIAGNOSIS — Z6830 Body mass index (BMI) 30.0-30.9, adult: Secondary | ICD-10-CM | POA: Diagnosis not present

## 2020-11-16 DIAGNOSIS — R262 Difficulty in walking, not elsewhere classified: Secondary | ICD-10-CM | POA: Diagnosis not present

## 2020-11-16 DIAGNOSIS — Z791 Long term (current) use of non-steroidal anti-inflammatories (NSAID): Secondary | ICD-10-CM | POA: Diagnosis not present

## 2020-11-16 DIAGNOSIS — E785 Hyperlipidemia, unspecified: Secondary | ICD-10-CM | POA: Diagnosis not present

## 2020-11-16 DIAGNOSIS — E669 Obesity, unspecified: Secondary | ICD-10-CM | POA: Diagnosis not present

## 2020-11-16 DIAGNOSIS — I10 Essential (primary) hypertension: Secondary | ICD-10-CM | POA: Diagnosis not present

## 2020-11-16 DIAGNOSIS — E876 Hypokalemia: Secondary | ICD-10-CM | POA: Diagnosis not present

## 2020-11-16 DIAGNOSIS — R112 Nausea with vomiting, unspecified: Secondary | ICD-10-CM | POA: Diagnosis not present

## 2020-11-16 DIAGNOSIS — R509 Fever, unspecified: Secondary | ICD-10-CM | POA: Diagnosis not present

## 2020-11-16 DIAGNOSIS — F419 Anxiety disorder, unspecified: Secondary | ICD-10-CM | POA: Diagnosis not present

## 2020-11-16 DIAGNOSIS — K644 Residual hemorrhoidal skin tags: Secondary | ICD-10-CM | POA: Diagnosis not present

## 2020-11-16 DIAGNOSIS — Z6828 Body mass index (BMI) 28.0-28.9, adult: Secondary | ICD-10-CM | POA: Diagnosis not present

## 2020-11-16 DIAGNOSIS — R1110 Vomiting, unspecified: Secondary | ICD-10-CM | POA: Diagnosis not present

## 2020-11-16 DIAGNOSIS — K5900 Constipation, unspecified: Secondary | ICD-10-CM | POA: Diagnosis not present

## 2020-11-16 DIAGNOSIS — N3000 Acute cystitis without hematuria: Secondary | ICD-10-CM | POA: Diagnosis not present

## 2020-11-16 DIAGNOSIS — D72829 Elevated white blood cell count, unspecified: Secondary | ICD-10-CM | POA: Diagnosis not present

## 2020-11-16 DIAGNOSIS — K219 Gastro-esophageal reflux disease without esophagitis: Secondary | ICD-10-CM | POA: Diagnosis not present

## 2020-11-16 DIAGNOSIS — K5909 Other constipation: Secondary | ICD-10-CM | POA: Diagnosis not present

## 2020-11-16 DIAGNOSIS — Z79899 Other long term (current) drug therapy: Secondary | ICD-10-CM | POA: Diagnosis not present

## 2020-11-16 DIAGNOSIS — R197 Diarrhea, unspecified: Secondary | ICD-10-CM | POA: Diagnosis not present

## 2020-11-16 DIAGNOSIS — R109 Unspecified abdominal pain: Secondary | ICD-10-CM | POA: Diagnosis not present

## 2020-11-16 DIAGNOSIS — Z20822 Contact with and (suspected) exposure to covid-19: Secondary | ICD-10-CM | POA: Diagnosis not present

## 2020-11-16 DIAGNOSIS — Z7982 Long term (current) use of aspirin: Secondary | ICD-10-CM | POA: Diagnosis not present

## 2020-11-16 DIAGNOSIS — R159 Full incontinence of feces: Secondary | ICD-10-CM | POA: Diagnosis not present

## 2020-11-17 DIAGNOSIS — R509 Fever, unspecified: Secondary | ICD-10-CM | POA: Diagnosis not present

## 2020-11-17 DIAGNOSIS — R112 Nausea with vomiting, unspecified: Secondary | ICD-10-CM | POA: Diagnosis not present

## 2020-11-17 DIAGNOSIS — R109 Unspecified abdominal pain: Secondary | ICD-10-CM | POA: Diagnosis not present

## 2020-11-17 DIAGNOSIS — E876 Hypokalemia: Secondary | ICD-10-CM | POA: Diagnosis not present

## 2020-11-23 DIAGNOSIS — N390 Urinary tract infection, site not specified: Secondary | ICD-10-CM | POA: Diagnosis not present

## 2020-11-23 DIAGNOSIS — E1165 Type 2 diabetes mellitus with hyperglycemia: Secondary | ICD-10-CM | POA: Diagnosis not present

## 2020-11-23 DIAGNOSIS — Z299 Encounter for prophylactic measures, unspecified: Secondary | ICD-10-CM | POA: Diagnosis not present

## 2020-11-23 DIAGNOSIS — I10 Essential (primary) hypertension: Secondary | ICD-10-CM | POA: Diagnosis not present

## 2020-11-23 DIAGNOSIS — Z09 Encounter for follow-up examination after completed treatment for conditions other than malignant neoplasm: Secondary | ICD-10-CM | POA: Diagnosis not present

## 2020-12-15 DIAGNOSIS — K5909 Other constipation: Secondary | ICD-10-CM | POA: Diagnosis not present

## 2020-12-15 DIAGNOSIS — F321 Major depressive disorder, single episode, moderate: Secondary | ICD-10-CM | POA: Diagnosis not present

## 2020-12-15 DIAGNOSIS — I10 Essential (primary) hypertension: Secondary | ICD-10-CM | POA: Diagnosis not present

## 2020-12-15 DIAGNOSIS — Z299 Encounter for prophylactic measures, unspecified: Secondary | ICD-10-CM | POA: Diagnosis not present

## 2020-12-21 DIAGNOSIS — I10 Essential (primary) hypertension: Secondary | ICD-10-CM | POA: Diagnosis not present

## 2020-12-30 ENCOUNTER — Other Ambulatory Visit: Payer: Self-pay | Admitting: Cardiology

## 2021-01-21 DIAGNOSIS — I10 Essential (primary) hypertension: Secondary | ICD-10-CM | POA: Diagnosis not present

## 2021-02-09 ENCOUNTER — Other Ambulatory Visit: Payer: Self-pay | Admitting: Cardiology

## 2021-02-19 DIAGNOSIS — I10 Essential (primary) hypertension: Secondary | ICD-10-CM | POA: Diagnosis not present

## 2021-03-01 DIAGNOSIS — R110 Nausea: Secondary | ICD-10-CM | POA: Diagnosis not present

## 2021-03-01 DIAGNOSIS — Z299 Encounter for prophylactic measures, unspecified: Secondary | ICD-10-CM | POA: Diagnosis not present

## 2021-03-01 DIAGNOSIS — K5909 Other constipation: Secondary | ICD-10-CM | POA: Diagnosis not present

## 2021-03-01 DIAGNOSIS — R109 Unspecified abdominal pain: Secondary | ICD-10-CM | POA: Diagnosis not present

## 2021-03-03 DIAGNOSIS — F321 Major depressive disorder, single episode, moderate: Secondary | ICD-10-CM | POA: Diagnosis not present

## 2021-03-03 DIAGNOSIS — I10 Essential (primary) hypertension: Secondary | ICD-10-CM | POA: Diagnosis not present

## 2021-03-03 DIAGNOSIS — F1320 Sedative, hypnotic or anxiolytic dependence, uncomplicated: Secondary | ICD-10-CM | POA: Diagnosis not present

## 2021-03-03 DIAGNOSIS — Z299 Encounter for prophylactic measures, unspecified: Secondary | ICD-10-CM | POA: Diagnosis not present

## 2021-03-03 DIAGNOSIS — E1165 Type 2 diabetes mellitus with hyperglycemia: Secondary | ICD-10-CM | POA: Diagnosis not present

## 2021-03-10 DIAGNOSIS — I25119 Atherosclerotic heart disease of native coronary artery with unspecified angina pectoris: Secondary | ICD-10-CM | POA: Diagnosis not present

## 2021-03-10 DIAGNOSIS — I10 Essential (primary) hypertension: Secondary | ICD-10-CM | POA: Diagnosis not present

## 2021-03-10 DIAGNOSIS — Z299 Encounter for prophylactic measures, unspecified: Secondary | ICD-10-CM | POA: Diagnosis not present

## 2021-03-10 DIAGNOSIS — E1165 Type 2 diabetes mellitus with hyperglycemia: Secondary | ICD-10-CM | POA: Diagnosis not present

## 2021-03-10 DIAGNOSIS — J449 Chronic obstructive pulmonary disease, unspecified: Secondary | ICD-10-CM | POA: Diagnosis not present

## 2021-03-11 DIAGNOSIS — R109 Unspecified abdominal pain: Secondary | ICD-10-CM | POA: Diagnosis not present

## 2021-03-11 DIAGNOSIS — K5900 Constipation, unspecified: Secondary | ICD-10-CM | POA: Diagnosis not present

## 2021-03-11 DIAGNOSIS — I10 Essential (primary) hypertension: Secondary | ICD-10-CM | POA: Diagnosis not present

## 2021-03-12 ENCOUNTER — Telehealth (INDEPENDENT_AMBULATORY_CARE_PROVIDER_SITE_OTHER): Payer: Self-pay | Admitting: *Deleted

## 2021-03-12 NOTE — Telephone Encounter (Signed)
I spoke with the patient daughter [name] [phone] she states her mother went to [facility] ED last night with a bowel blockage, and they recommended that the patient follow up here with her GI doctor. We have no openings till 07/2021. Please advise where to place the patient on our schedule. She was last seen by Thayer Headings and you before that.

## 2021-03-12 NOTE — Telephone Encounter (Signed)
I called and left a message if they still need our assistance to call us back.

## 2021-03-12 NOTE — Telephone Encounter (Signed)
Patient's daughter called - Ms Heather [name] was seen in ER fopr constipation - Ms Heather [name] is calling to see what they need to do - please advise

## 2021-03-12 NOTE — Telephone Encounter (Signed)
Can see me oin May 31st at 330 PM Thanks

## 2021-03-15 DIAGNOSIS — K219 Gastro-esophageal reflux disease without esophagitis: Secondary | ICD-10-CM | POA: Diagnosis not present

## 2021-03-15 DIAGNOSIS — I509 Heart failure, unspecified: Secondary | ICD-10-CM | POA: Diagnosis not present

## 2021-03-15 DIAGNOSIS — I25118 Atherosclerotic heart disease of native coronary artery with other forms of angina pectoris: Secondary | ICD-10-CM | POA: Diagnosis not present

## 2021-03-15 DIAGNOSIS — K5909 Other constipation: Secondary | ICD-10-CM | POA: Diagnosis not present

## 2021-03-15 DIAGNOSIS — E261 Secondary hyperaldosteronism: Secondary | ICD-10-CM | POA: Diagnosis not present

## 2021-03-15 NOTE — Telephone Encounter (Signed)
Mitzie please place on the schedule.  Patient daughter [name] aware of all.

## 2021-03-16 DIAGNOSIS — K5909 Other constipation: Secondary | ICD-10-CM | POA: Diagnosis not present

## 2021-03-16 DIAGNOSIS — D0439 Carcinoma in situ of skin of other parts of face: Secondary | ICD-10-CM | POA: Diagnosis not present

## 2021-03-16 DIAGNOSIS — I10 Essential (primary) hypertension: Secondary | ICD-10-CM | POA: Diagnosis not present

## 2021-03-16 DIAGNOSIS — Z299 Encounter for prophylactic measures, unspecified: Secondary | ICD-10-CM | POA: Diagnosis not present

## 2021-03-16 DIAGNOSIS — D485 Neoplasm of uncertain behavior of skin: Secondary | ICD-10-CM | POA: Diagnosis not present

## 2021-03-18 ENCOUNTER — Other Ambulatory Visit: Payer: Self-pay

## 2021-03-18 ENCOUNTER — Encounter (INDEPENDENT_AMBULATORY_CARE_PROVIDER_SITE_OTHER): Payer: Self-pay | Admitting: Gastroenterology

## 2021-03-18 ENCOUNTER — Telehealth (INDEPENDENT_AMBULATORY_CARE_PROVIDER_SITE_OTHER): Payer: PPO | Admitting: Gastroenterology

## 2021-03-18 VITALS — Ht 67.0 in | Wt 182.0 lb

## 2021-03-18 DIAGNOSIS — K5900 Constipation, unspecified: Secondary | ICD-10-CM | POA: Diagnosis not present

## 2021-03-18 NOTE — Progress Notes (Signed)
Maylon Peppers, M.D. Gastroenterology & Hepatology [facility] [address] [place], [place] [address] Primary Care Physician: Glenda Chroman, MD [address] [place] [address]  This is a telephone virtual visit.  It required patient-provider interaction for the medical decision making as documented below. The patient has consented and agreed to proceed with a Telehealth encounter given the current Coronavirus pandemic.  VIRTUAL VISIT NOTE Patient location: home Provider location: home  I will communicate my assessment and recommendations to the referring MD via EMR.  Problems: 1. Chronic constipation 2. Fatty liver  History of Present Illness:  Heather [name] is a 87 y.o. female with past medical history of anxiety, GERD, hypertension, who presents for evaluation of chronic constipation.  Patient was last seen on 10/17/2020.  At that time was taking MiraLAX once a day with some discomfort in her abdomen but it was not clear if she was taking that [place] compliantly.  She had previous investigations for her abdominal discomfort which included a gastric emptying study that was normal and a CT angio the abdomen and pelvis which were negative for intestinal angina.   Patient reports that she has presented persistent constipation despite taking multiple laxatives in the past. She reports that she has tried different medications including Miralax, stool softener and Linzess 145 mcg every day but she has not felt any improvement.  The patient's daughter is also part of the visit and she states that even though they have tried different medications she has presented recurrent episodes of fecal impaction.  Patient most recently went to [facility] on 03/11/2021 after she presented an episode of significant constipation with abdominal discomfort and bloating.  She was advised to take magnesium citrate which relieve her constipation.   She reports she had a large hard bowel movement at that time. She is having significant abdominal bloating and pain diffusely, especially when she is constipated. These symptoms improve when she moves her bowels. She feels nauseated due to the constipation but this has led to decreased food intake.  Since her last visit to the ER, the patient has only been taking stool softener a daily and Miralax every other day 1 cupful every day. She has been taking magnesium citrate when she is too constipated.  Tried Motegrity in the past without improvement based on the daughter's opinion.  The patient reports that she tried Linzess previously and it initially helped her relieve her constipation but it stopped working with time.  The patient denies having any nausea, vomiting, fever, chills, hematochezia, melena, hematemesis, diarrhea, jaundice, pruritus or weight loss.  Past Medical History: Past Medical History:  Diagnosis Date  . Anxiety   . GERD (gastroesophageal reflux disease)   . Hx of shoulder surgery    Left Shoulder - pitched nerve.  . Other and unspecified hyperlipidemia   . Unspecified essential hypertension     Past Surgical History: Past Surgical History:  Procedure Laterality Date  . BIOPSY  04/09/2020   Procedure: BIOPSY;  Surgeon: Rogene Houston, MD;  [facility];  Service: Endoscopy;;  . CERVICAL DISC SURGERY    . CESAREAN SECTION  1970  . ESOPHAGOGASTRODUODENOSCOPY N/A 04/09/2020   Procedure: ESOPHAGOGASTRODUODENOSCOPY (EGD);  Surgeon: Rogene Houston, MD;  [facility];  Service: Endoscopy;  Laterality: N/A;  1030  . History of shoulder surgery Left    pitched nerve.    Family History: Family History  Problem Relation Age of Onset  .  Hypertension Other   . Heart attack Sister     Social History: Social History   Tobacco Use  Smoking Status Never Smoker  Smokeless Tobacco Never Used   Social History   Substance and Sexual Activity   Alcohol Use No  . Alcohol/week: 0.0 standard drinks   Social History   Substance and Sexual Activity  Drug Use No    Allergies: No Known Allergies  Medications: Current Outpatient Medications  Medication Sig Dispense Refill  . albuterol (VENTOLIN HFA) 108 (90 Base) MCG/ACT inhaler Inhale 2 puffs into the lungs every 4 (four) hours as needed for wheezing or shortness of breath.     . ALPRAZolam (XANAX) 1 MG tablet Take 1 mg by mouth 2 (two) times daily.     Marland Kitchen amLODipine (NORVASC) 10 MG tablet Take 10 mg by mouth every morning.   12  . Ascorbic Acid (VITAMIN C WITH ROSE HIPS) 500 MG tablet Take 500 mg by mouth daily.    Marland Kitchen aspirin 81 MG tablet Take 2 tablets (162 mg total) by mouth in the morning and at bedtime. 30 tablet   . Calcium-Magnesium-Zinc (CAL-MAG-ZINC PO) Take 1 tablet by mouth daily.    . chlorthalidone (HYGROTON) 25 MG tablet Take 25 mg by mouth daily.    . ferrous sulfate 325 (65 FE) MG tablet Take 325 mg by mouth daily with breakfast.    . furosemide (LASIX) 40 MG tablet Take 40 mg by mouth daily.    . hyoscyamine (LEVSIN SL) 0.125 MG SL tablet place ONE tablet UNDER THE TONGUE EVERY 6 HOURS AS NEEDED FOR cramping 30 tablet 0  . isosorbide mononitrate (IMDUR) 30 MG 24 hr tablet Take 2 tablets (60 mg total) by mouth daily. 180 tablet 3  . metoprolol tartrate (LOPRESSOR) 25 MG tablet Take 25 mg by mouth 2 (two) times daily.  3  . Naphazoline-Pheniramine (OPCON-A) 0.027-0.315 % SOLN Place 1 drop into both eyes 2 (two) times daily as needed (dry eyes).    . nitroGLYCERIN (NITROSTAT) 0.4 MG SL tablet Place 0.4 mg under the tongue every 5 (five) minutes as needed for chest pain.    Marland Kitchen omeprazole (PRILOSEC) 40 MG capsule Take 40 mg by mouth daily.    . potassium chloride (KLOR-CON) 20 MEQ packet Take 20 mEq by mouth daily.    . Prucalopride Succinate (MOTEGRITY) 1 MG TABS Take 1 mg by mouth daily with breakfast. 30 tablet 5  . VITAMIN D PO Take 1 capsule by mouth daily.     .  Polyethylene Glycol 3350 (MIRALAX PO) Take by mouth. (Patient not taking: Reported on 03/18/2021)     No current facility-administered medications for this visit.    Review of Systems: GENERAL: negative for malaise, night sweats HEENT: No changes in hearing or vision, no nose bleeds or other nasal problems. NECK: Negative for lumps, goiter, pain and significant neck swelling RESPIRATORY: Negative for cough, wheezing CARDIOVASCULAR: Negative for chest pain, leg swelling, palpitations, orthopnea GI: SEE HPI MUSCULOSKELETAL: Negative for joint pain or swelling, back pain, and muscle pain. SKIN: Negative for lesions, rash PSYCH: Negative for sleep disturbance, mood disorder and recent psychosocial stressors. HEMATOLOGY Negative for prolonged bleeding, bruising easily, and swollen nodes. ENDOCRINE: Negative for cold or heat intolerance, polyuria, polydipsia and goiter. NEURO: negative for tremor, gait imbalance, syncope and seizures. The remainder of the review of systems is noncontributory.   Physical Exam: No exam was performed as this was a telephone encounter  Imaging/Labs: as above  I  personally reviewed and interpreted the available labs, imaging and endoscopic files.  Impression and Plan: Heather [name] is a 87 y.o. female with past medical history of anxiety, GERD, hypertension, who presents for evaluation of chronic constipation.  The patient has presented chronic symptoms of constipation which have not responded to most of over-the-counter laxatives.  I consider that she can have some benefit from increasing her current MiraLAX to twice a day dosing.  I advised her that she could increase endoscopic ultrasound up to 3 times a day and assess symptom control with goal of decreasing her bloating and abdominal distention by making her bowels more frequent.  If this fails to improve her symptoms, we may add Linzess in conjunction to MiraLAX to improve her symptoms tomorrow.  Both the  daughter and the patient understood and agreed.  Also advised him that taking magnesium citrate on a daily basis is not ideal and she should only take it as a salvage therapy.  - Increase Miralax 1 cupful every 12 hours. If after one week there is no improvement, increase to 1 cup every 8 hours - If persistence of bloating/abdominal pain despite taking Miralax, patient will call to the office as Linzess 145 mcg may need to be added to current regimen - Take magnesium citrate only if severe symptoms despite taking the other laxatives  All questions were answered.      Total visit time: I spent a total of 30 minutes  Maylon Peppers, MD Gastroenterology and Hepatology [facility]

## 2021-03-18 NOTE — Patient Instructions (Signed)
Increase Miralax 1 cupful every 12 hours. If after one week there is no improvement, increase to 1 cup every 8 hours If persistence of bloating/abdominal pain despite taking Miralax, call to the office as Linzess 145 mcg may need to be added to current regimen Take magnesium citrate only if you're having severe symptoms despite taking the other laxatives

## 2021-03-23 ENCOUNTER — Ambulatory Visit (INDEPENDENT_AMBULATORY_CARE_PROVIDER_SITE_OTHER): Payer: PPO | Admitting: Gastroenterology

## 2021-03-23 DIAGNOSIS — I10 Essential (primary) hypertension: Secondary | ICD-10-CM | POA: Diagnosis not present

## 2021-04-22 ENCOUNTER — Telehealth (INDEPENDENT_AMBULATORY_CARE_PROVIDER_SITE_OTHER): Payer: Self-pay | Admitting: Gastroenterology

## 2021-04-22 DIAGNOSIS — I10 Essential (primary) hypertension: Secondary | ICD-10-CM | POA: Diagnosis not present

## 2021-04-22 NOTE — Telephone Encounter (Signed)
I spoke with the patient she states she is miserable, nauseous,bloating,abdominal edema and Periumbilical pain. She states she can not eat as it makes her nauseous.She states she ate a fish sandwich two hours ago and she had to lay down and stay there with the covers pulled above her head, as she was to nauseous to get up. She has some shortness of breath upon exertion.She states she has some issues with constipation,but did have a bm this am. She says her food does not move from her stomach when she eats, and this is what makes her nauseous. She said she had thought about going to the Ed,but she wanted to go get her driving licenses renewed tomorrow. I advised that if she feels that she needs to go to the Ed then She will need to go. Patient states she will speak with her son as he is on his way to her home and ask if he thinks she should go to the Ed.

## 2021-04-22 NOTE — Telephone Encounter (Signed)
Patients daughter called stated patient is having abdominal pain - states medication is not helping - please advise - ph# [phone]

## 2021-04-22 NOTE — Telephone Encounter (Signed)
Spoke with the patient, who reported that she is having significant constipation and abdominal bloating with discomfort.  States that her symptoms have not improved with MiraLAX.  I advised her to start taking Trulance, samples will be provided, she will pick them up tomorrow.  She will give [place] a call to let us know if her symptoms improved with this or not.

## 2021-04-23 NOTE — Telephone Encounter (Signed)
I left # 30 of Trulance at the front desk for her to pick up.

## 2021-05-10 ENCOUNTER — Telehealth (INDEPENDENT_AMBULATORY_CARE_PROVIDER_SITE_OTHER): Payer: Self-pay | Admitting: Gastroenterology

## 2021-05-10 DIAGNOSIS — K5904 Chronic idiopathic constipation: Secondary | ICD-10-CM

## 2021-05-10 DIAGNOSIS — K5909 Other constipation: Secondary | ICD-10-CM

## 2021-05-10 MED ORDER — LINACLOTIDE 145 MCG PO CAPS: 145.0000 ug | ORAL_CAPSULE | Freq: Every day | ORAL | 3 refills | Status: DC

## 2021-05-10 NOTE — Telephone Encounter (Signed)
Patient called to the office, states she has not had regular Bms even though she took Trulance 3 mg qday (actually went up to BID dosing on her own). I told her that I will switch her to Linzess 145 mcg qday and she should stop Trulance.  Patient understood and agreed.

## 2021-05-13 DIAGNOSIS — B351 Tinea unguium: Secondary | ICD-10-CM | POA: Diagnosis not present

## 2021-05-13 DIAGNOSIS — M79676 Pain in unspecified toe(s): Secondary | ICD-10-CM | POA: Diagnosis not present

## 2021-05-21 DIAGNOSIS — I10 Essential (primary) hypertension: Secondary | ICD-10-CM | POA: Diagnosis not present

## 2021-05-23 DIAGNOSIS — I10 Essential (primary) hypertension: Secondary | ICD-10-CM | POA: Diagnosis not present

## 2021-05-23 DIAGNOSIS — F419 Anxiety disorder, unspecified: Secondary | ICD-10-CM | POA: Diagnosis not present

## 2021-05-31 DIAGNOSIS — K5909 Other constipation: Secondary | ICD-10-CM | POA: Diagnosis not present

## 2021-05-31 DIAGNOSIS — I2510 Atherosclerotic heart disease of native coronary artery without angina pectoris: Secondary | ICD-10-CM | POA: Diagnosis not present

## 2021-05-31 DIAGNOSIS — D692 Other nonthrombocytopenic purpura: Secondary | ICD-10-CM | POA: Diagnosis not present

## 2021-05-31 DIAGNOSIS — I509 Heart failure, unspecified: Secondary | ICD-10-CM | POA: Diagnosis not present

## 2021-05-31 DIAGNOSIS — E261 Secondary hyperaldosteronism: Secondary | ICD-10-CM | POA: Diagnosis not present

## 2021-05-31 DIAGNOSIS — F411 Generalized anxiety disorder: Secondary | ICD-10-CM | POA: Diagnosis not present

## 2021-06-08 ENCOUNTER — Telehealth (INDEPENDENT_AMBULATORY_CARE_PROVIDER_SITE_OTHER): Payer: Self-pay

## 2021-06-08 NOTE — Telephone Encounter (Signed)
Patient called today stating she is staying nauseated when she eats, no vomiting.  She is having epigastric pain and bloating states to touch her left breast is sore. She has been constipated and had to take two suppositories this am to have a bm. She states she had a good Bm after this. She reports to have not had a bm since two days ago. She has occasional shortness of breath and edema in bilateral extremities.She states the symptoms have been on going for the last month. She stated she has not had any dark or bloody stools. She has had a cough and drainage on going for he last two weeks. She takes Furosemide 40 mg once per day. She is taking Linzess 145 mcg per day .

## 2021-06-09 ENCOUNTER — Other Ambulatory Visit (INDEPENDENT_AMBULATORY_CARE_PROVIDER_SITE_OTHER): Payer: Self-pay | Admitting: Gastroenterology

## 2021-06-09 DIAGNOSIS — K5909 Other constipation: Secondary | ICD-10-CM

## 2021-06-09 MED ORDER — PEG 3350-KCL-NA BICARB-NACL 420 G PO SOLR: 4000.0000 mL | Freq: Once | ORAL | 0 refills | Status: AC

## 2021-06-09 MED ORDER — LINACLOTIDE 290 MCG PO CAPS: 290.0000 ug | ORAL_CAPSULE | Freq: Every day | ORAL | 3 refills | Status: DC

## 2021-06-09 NOTE — Telephone Encounter (Signed)
Spoke with the patient, states she has presented recurrent nausea and bloating despite taking Linzess two pills (unclear if she is taking the 145 mcg dose as the patient does not know) and MiraLAX.  She uses suppository yesterday and was able finally to have bowel movement.  I will send a bowel prep to help her since her bowel but I advised her to continue taking Linzess compliantly 290 micrograms every day and MiraLAX TID.

## 2021-06-15 DIAGNOSIS — Z299 Encounter for prophylactic measures, unspecified: Secondary | ICD-10-CM | POA: Diagnosis not present

## 2021-06-15 DIAGNOSIS — E1165 Type 2 diabetes mellitus with hyperglycemia: Secondary | ICD-10-CM | POA: Diagnosis not present

## 2021-06-15 DIAGNOSIS — J9611 Chronic respiratory failure with hypoxia: Secondary | ICD-10-CM | POA: Diagnosis not present

## 2021-06-15 DIAGNOSIS — I25119 Atherosclerotic heart disease of native coronary artery with unspecified angina pectoris: Secondary | ICD-10-CM | POA: Diagnosis not present

## 2021-06-15 DIAGNOSIS — I10 Essential (primary) hypertension: Secondary | ICD-10-CM | POA: Diagnosis not present

## 2021-06-21 DIAGNOSIS — R110 Nausea: Secondary | ICD-10-CM | POA: Diagnosis not present

## 2021-06-21 DIAGNOSIS — F1320 Sedative, hypnotic or anxiolytic dependence, uncomplicated: Secondary | ICD-10-CM | POA: Diagnosis not present

## 2021-06-21 DIAGNOSIS — R197 Diarrhea, unspecified: Secondary | ICD-10-CM | POA: Diagnosis not present

## 2021-06-21 DIAGNOSIS — Z299 Encounter for prophylactic measures, unspecified: Secondary | ICD-10-CM | POA: Diagnosis not present

## 2021-06-21 DIAGNOSIS — I25119 Atherosclerotic heart disease of native coronary artery with unspecified angina pectoris: Secondary | ICD-10-CM | POA: Diagnosis not present

## 2021-06-23 DIAGNOSIS — I10 Essential (primary) hypertension: Secondary | ICD-10-CM | POA: Diagnosis not present

## 2021-06-24 ENCOUNTER — Encounter (INDEPENDENT_AMBULATORY_CARE_PROVIDER_SITE_OTHER): Payer: Self-pay | Admitting: Gastroenterology

## 2021-06-24 ENCOUNTER — Ambulatory Visit (INDEPENDENT_AMBULATORY_CARE_PROVIDER_SITE_OTHER): Payer: PPO | Admitting: Gastroenterology

## 2021-06-24 ENCOUNTER — Other Ambulatory Visit: Payer: Self-pay

## 2021-06-24 VITALS — BP 108/69 | HR 79 | Temp 99.8°F | Ht 67.0 in | Wt 185.0 lb

## 2021-06-24 DIAGNOSIS — K5900 Constipation, unspecified: Secondary | ICD-10-CM | POA: Diagnosis not present

## 2021-06-24 DIAGNOSIS — K760 Fatty (change of) liver, not elsewhere classified: Secondary | ICD-10-CM | POA: Diagnosis not present

## 2021-06-24 NOTE — Progress Notes (Addendum)
Referring Provider: Glenda Chroman, MD Primary Care Physician:  Glenda Chroman, MD Primary GI Physician: Jenetta Downer  Chief Complaint  Patient presents with   Follow-up    3 month follow up. Having nausea, abdominal pain, gas, bloating, heartburn, has BM once a day if taking miralax, laxative, appetite not good due to nausea, abdominal pain and bloating.     HPI:   Heather [name] is a 87 y.o. female with past medical history of anxiety, gerd, htn, chronic constipation and fatty liver.  Patient presenting today for follow up of fatty liver constipation.  Chronic constipation:ongoing, patient has been on multiple medications in the past.Has tried miralax, linzess 145 mcg, motegrity. previous gastric emtpying study that was normal, CT angio of abdomen and pelvis which were negative (07/08/20). Seen at [facility] on 03/11/21 for abd pain, bloating related to constipation, had to be disimpacted there. Took mag citrate therafter with good result.  At last visit she was advised to take miralax 1 capful q12 hour and increase to 1 capful q8 hours if no results in 1 week. She was given some samples of linzess 229mg by Dr. VWoody Sellerthereafter which she tried, we sent a prescription for her for this however, she stated it was too expensive ($60). She recently ran out of linzess samples and started back on miralax, currently taking 2 capfuls every morning, she denies straining and states that it is a normal, formed stool, having 1-2 BMs per day usually in the morning.  She reports around 2pm she is having abdominal pain, feeling like she needs to have another BM but does not until the next morning when she takes her miralax, pain then resolves. Had 4 episodes of diarrhea a few days ago after not having a BM for 2-3 days. She denied any associated symptoms at that time  Denies melena or hematochezia, denies weight loss, denies early satiety.   Fatty liver: no recent LFTs since august 2021, AST 46, ALT  37, Alk phos and t bili WNL. Recent labs at Dr. VMarcial Pacas we will obtain these. No recent imagining or previous elastography. last CT abdomen 07/08/20 questioned possible cirrhosis  Last Colonoscopy:08/22/12 moderate diverticulosis in sigmoid, two polyps in sigmoid (tubular adenoma)  Last Endoscopy:04/09/20- Normal hypopharynx. - Normal esophagus. - Z-line irregular, 38 cm from the incisors. - Erythematous mucosa in the antrum and prepyloric region of the stomach. (Gastric antral mucosa with non specific reactive gastropathy. - Normal duodenal bulb and second portion of the duodenum.   Past Medical History:  Diagnosis Date   Anxiety    GERD (gastroesophageal reflux disease)    Hx of shoulder surgery    Left Shoulder - pitched nerve.   Other and unspecified hyperlipidemia    Unspecified essential hypertension     Past Surgical History:  Procedure Laterality Date   BIOPSY  04/09/2020   Procedure: BIOPSY;  Surgeon: RRogene Houston MD;  [facility];  Service: Endoscopy;;   CERVICAL [place]  ESOPHAGOGASTRODUODENOSCOPY N/A 04/09/2020   Procedure: ESOPHAGOGASTRODUODENOSCOPY (EGD);  Surgeon: RRogene Houston MD;  [facility];  Service: Endoscopy;  Laterality: N/A;  1030   History of shoulder surgery Left    pitched nerve.    Current Outpatient Medications  Medication Sig Dispense Refill   albuterol (VENTOLIN HFA) 108 (90 Base) MCG/ACT inhaler Inhale 2 puffs into the lungs every 4 (four) hours as needed for wheezing or shortness  of breath.      ALPRAZolam (XANAX) 1 MG tablet Take 1 mg by mouth 2 (two) times daily.      amLODipine (NORVASC) 10 MG tablet Take 10 mg by mouth every morning.   12   Ascorbic Acid (VITAMIN C WITH ROSE HIPS) 500 MG tablet Take 500 mg by mouth daily.     aspirin 81 MG tablet Take 2 tablets (162 mg total) by mouth in the morning and at bedtime. 30 tablet    Calcium-Magnesium-Zinc (CAL-MAG-ZINC PO) Take 1  tablet by mouth daily.     chlorthalidone (HYGROTON) 25 MG tablet Take 25 mg by mouth daily.     ferrous sulfate 325 (65 FE) MG tablet Take 325 mg by mouth daily with breakfast.     furosemide (LASIX) 40 MG tablet Take 40 mg by mouth daily.     isosorbide mononitrate (IMDUR) 30 MG 24 hr tablet Take 2 tablets (60 mg total) by mouth daily. 180 tablet 3   meloxicam (MOBIC) 7.5 MG tablet Take 7.5 mg by mouth daily.     metoprolol tartrate (LOPRESSOR) 25 MG tablet Take 25 mg by mouth 2 (two) times daily.  3   Naphazoline-Pheniramine (OPCON-A) 0.027-0.315 % SOLN Place 1 drop into both eyes 2 (two) times daily as needed (dry eyes).     nitroGLYCERIN (NITROSTAT) 0.4 MG SL tablet Place 0.4 mg under the tongue every 5 (five) minutes as needed for chest pain.     omeprazole (PRILOSEC) 40 MG capsule Take 40 mg by mouth daily.     Polyethylene Glycol 3350 (MIRALAX PO) Take by mouth.     potassium chloride (KLOR-CON) 20 MEQ packet Take 20 mEq by mouth daily.     VITAMIN D PO Take 1 capsule by mouth daily.      hyoscyamine (LEVSIN SL) 0.125 MG SL tablet place ONE tablet UNDER THE TONGUE EVERY 6 HOURS AS NEEDED FOR cramping (Patient not taking: Reported on 06/24/2021) 30 tablet 0   linaclotide (LINZESS) 290 MCG CAPS capsule Take 1 capsule (290 mcg total) by mouth daily before breakfast. (Patient not taking: Reported on 06/24/2021) 90 capsule 3   No current facility-administered medications for this visit.    Allergies as of 06/24/2021   (No Known Allergies)    Family History  Problem Relation Age of Onset   Hypertension Other    Heart attack Sister     Social History   Socioeconomic History   Marital status: Widowed    Spouse name: Not on file   Number of children: Not on file   Years of education: Not on file   Highest education level: Not on file  Occupational History   Not on file  Tobacco Use   Smoking status: Never   Smokeless tobacco: Never  Vaping Use   Vaping Use: Never used   Substance and Sexual Activity   Alcohol use: No    Alcohol/week: 0.0 standard drinks   Drug use: No   Sexual activity: Not on file  Other Topics Concern   Not on file  Social History Narrative   Not on file   Social Determinants of Health   Financial Resource Strain: Not on file  Food Insecurity: Not on file  Transportation Needs: Not on file  Physical Activity: Not on file  Stress: Not on file  Social Connections: Not on file    Review of Systems: Gen: Denies fever, chills, anorexia. Denies fatigue, weakness, weight loss.  CV: Denies chest pain, palpitations, syncope,  peripheral edema, and claudication. Resp: Denies dyspnea at rest, cough, wheezing, coughing up blood, and pleurisy. GI: Denies vomiting blood, jaundice, and fecal incontinence. Denies dysphagia or odynophagia. +constipation, +lower abdominal pain Derm: Denies rash, itching, dry skin Psych: Denies depression, anxiety, memory loss, confusion. No homicidal or suicidal ideation.  Heme: Denies bruising, bleeding, and enlarged lymph nodes.  Physical Exam: BP 108/69 (BP Location: Right Arm, Patient Position: Sitting, Cuff Size: Large)   Pulse 79   Temp 99.8 F (37.7 C) (Oral)   Ht _0  (1.702 m)   Wt 185 lb (83.9 kg)   BMI 28.98 kg/m  General:   Alert and oriented. No distress noted. Pleasant and cooperative.  Head:  Normocephalic and atraumatic. Eyes:  Conjuctiva clear without scleral icterus. Mouth:  Oral mucosa pink and moist. Good dentition. No lesions. Heart: Normal rate and rhythm, s1 and s2 heart sounds present.  Lungs: Clear lung sounds in all lobes. Respirations equal and unlabored. Abdomen:  +BS, soft, non-tender and non-distended. No rebound or guarding. No HSM or masses noted. Derm: No palmar erythema or jaundice Msk:  Symmetrical without gross deformities. Normal posture. Extremities:  Without edema. Neurologic:  Alert and  oriented x4 Psych:  Alert and cooperative. Normal mood and  affect.  Invalid input(s): 6 MONTHS   ASSESSMENT: Heather [name] is a 87 y.o. female presenting today for follow up of constipation and fatty liver.   Longstanding history of constipation, has tried multipled medications in the past, linzess 253mg works well but patient not able to afford prescription. Was taking samples from dr vyas however, she ran out. Is doing 2 capfuls of miralax in the morning with good result, however, begins to develop lower abdominal pain around 2 pm like she needs to have a BM but is not able to until taking her next morning dose of miralax which improves abdominal pain, we will add another capful of miralax in the afternoon. She endorses 4 episodes of diarrhea a few days ago after not having a BM for 2-3 days. I suspect this could have been related to overflow diarrhea. Typically having 1-2 BMs in the morning.  No red flag symptoms. Patient denies melena, hematochezia, nausea, vomiting,   dysphagia, odyonophagia, early satiety or weight loss.   Will obtain labs from Dr. VWoody Seller will let patient know if we need to order LFTs. Will calculate NAFLD score based on this, if abnormal, we will proceed with liver elastography and fibrotest to evaluate for possible cirrhosis as suggested on CT of abdomen in 2021.    PLAN:  Continue 2 capfuls miralax in morning, add another capful in afternoon 2.  We will Obtain labs from dr vyas 3. Calculate NAFLD score once we have updated LFTs   Follow Up: 3 months  Heather [name] L. CAlver Sorrow MSN, APRN, AGNP-C Adult-Gerontology Nurse Practitioner [facility]  **Addendum, labs were obtained from PCP for review of LFTs (collected 06/21/21) AST 66, ALT 49, Plt 244, Albumin 4.2, alk phos 63, NAFLD score -0.31 indeterminate, we will hold off on elastography and fibrotest at this time.

## 2021-06-24 NOTE — Patient Instructions (Addendum)
Please continue 2 capfuls of miralax in the morning, and take 1 capful in the afternoon. I suspect your abdominal pain is due to some ongoing constipation. We will obtain labs from Dr. Woody Seller, if they did not check your liver function, we will let you know and order liver function tests.   Follow up in 3 months

## 2021-07-23 DIAGNOSIS — I10 Essential (primary) hypertension: Secondary | ICD-10-CM | POA: Diagnosis not present

## 2021-07-27 ENCOUNTER — Encounter (INDEPENDENT_AMBULATORY_CARE_PROVIDER_SITE_OTHER): Payer: Self-pay

## 2021-07-28 DIAGNOSIS — K219 Gastro-esophageal reflux disease without esophagitis: Secondary | ICD-10-CM | POA: Diagnosis not present

## 2021-07-28 DIAGNOSIS — K5900 Constipation, unspecified: Secondary | ICD-10-CM | POA: Diagnosis not present

## 2021-07-28 DIAGNOSIS — Z299 Encounter for prophylactic measures, unspecified: Secondary | ICD-10-CM | POA: Diagnosis not present

## 2021-07-28 DIAGNOSIS — G4700 Insomnia, unspecified: Secondary | ICD-10-CM | POA: Diagnosis not present

## 2021-07-28 DIAGNOSIS — R21 Rash and other nonspecific skin eruption: Secondary | ICD-10-CM | POA: Diagnosis not present

## 2021-08-02 ENCOUNTER — Telehealth (INDEPENDENT_AMBULATORY_CARE_PROVIDER_SITE_OTHER): Payer: Self-pay | Admitting: *Deleted

## 2021-08-02 DIAGNOSIS — K5900 Constipation, unspecified: Secondary | ICD-10-CM | POA: Diagnosis not present

## 2021-08-02 DIAGNOSIS — R5381 Other malaise: Secondary | ICD-10-CM | POA: Diagnosis not present

## 2021-08-02 NOTE — Telephone Encounter (Signed)
Pt's daughter [name] called and states pt is having nausea, constipation, and tenderness around navel and out on both sides. Was taken off linzess because it was not helping, now taking miralax 2 capfuls daily, called pcp last Thursday and states was told to take a dulcolax and do enema. Did have a BM after that. Took dulcolax again today. No BM since doing enema last Thursday  [facility].   Pt's number  [phone]

## 2021-08-05 NOTE — Telephone Encounter (Signed)
Pt states she had Bm today and would like to hold off on amitiz and will try miralax 2 capfuls in the mornings and one in the evenings and call back if any problems.

## 2021-08-09 IMAGING — NM NM GASTRIC EMPTYING
10 series · 10 of 10 positions shown · non-contrast
Comparison: None

CLINICAL DATA: Nausea and vomiting, early satiety, stomach ache

EXAM:
NUCLEAR MEDICINE GASTRIC EMPTYING SCAN
TECHNIQUE: After oral ingestion of radiolabeled meal, sequential abdominal
images were obtained for 4 hours. Percentage of activity emptying
the stomach was calculated at 1 hour, 2 hour, 3 hour, and 4 hours.
RADIOPHARMACEUTICALS:  2.1 mCi Pc-11m sulfur colloid in standardized
meal

[Series 1: 0 min · 4.14mm/px · 1 of 1 slices shown (1 of 2)]
[im 1/1]
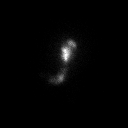

[Series 1: 0 min · 4.14mm/px · 1 of 1 slices shown (2 of 2)]
[im 1/1]
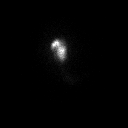

[Series 2: 60 min · 4.14mm/px · 1 of 1 slices shown (1 of 2)]
[im 1/1]
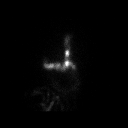

[Series 2: 60 min · 4.14mm/px · 1 of 1 slices shown (2 of 2)]
[im 1/1]
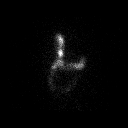

[Series 3: 120 min · 4.14mm/px · 1 of 1 slices shown (1 of 2)]
[im 1/1]
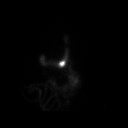

[Series 3: 120 min · 4.14mm/px · 1 of 1 slices shown (2 of 2)]
[im 1/1  full-range]
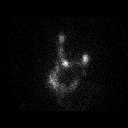

[Series 4: 180 min · 4.14mm/px · 1 of 1 slices shown (1 of 2)]
[im 1/1]
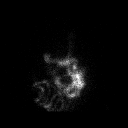

[Series 4: 180 min · 4.14mm/px · 1 of 1 slices shown (2 of 2)]
[im 1/1  full-range]
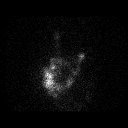

[Series 5: 240 min · 4.14mm/px · 1 of 1 slices shown (1 of 2)]
[im 1/1]
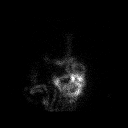

[Series 5: 240 min · 4.14mm/px · 1 of 1 slices shown (2 of 2)]
[im 1/1  full-range]
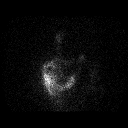

[10 of 10 positions shown; findings below may reference images not displayed]

FINDINGS: Expected location of the stomach in the left upper quadrant.

Ingested meal empties the stomach gradually over the course of the
study.

24% emptied at 1 hr ( normal >= 10%)

46% emptied at 2 hr ( normal >= 40%)

68% emptied at 3 hr ( normal >= 70%)

90% emptied at 4 hr ( normal >= 90%)
IMPRESSION: Normal gastric emptying study.

## 2021-08-23 DIAGNOSIS — I10 Essential (primary) hypertension: Secondary | ICD-10-CM | POA: Diagnosis not present

## 2021-08-30 ENCOUNTER — Other Ambulatory Visit (INDEPENDENT_AMBULATORY_CARE_PROVIDER_SITE_OTHER): Payer: Self-pay | Admitting: Gastroenterology

## 2021-08-30 ENCOUNTER — Telehealth (INDEPENDENT_AMBULATORY_CARE_PROVIDER_SITE_OTHER): Payer: Self-pay | Admitting: Gastroenterology

## 2021-08-30 DIAGNOSIS — R1032 Left lower quadrant pain: Secondary | ICD-10-CM

## 2021-08-30 MED ORDER — HYOSCYAMINE SULFATE 0.125 MG SL SUBL: 0.1250 mg | SUBLINGUAL_TABLET | Freq: Three times a day (TID) | SUBLINGUAL | 0 refills | Status: DC | PRN

## 2021-08-30 NOTE — Telephone Encounter (Signed)
Patient called today stating her stomach has been hurting ongoing for the last two months.She states it it the entire area of stomach from her naval across. She denies any nausea,vomiting,diarrhea, constipation,fever.She has been taking Tums, Miralax, omeprazole and has ran out of Hyoscyamine, no longer taking Linzess. If medication sent in she wants it to go to [place]. Please advise.

## 2021-08-30 NOTE — Telephone Encounter (Signed)
Spoke with the daughter today, she will be prescribed Levsin as the daughter does not recall if she has taking that in the past and it showed that she is not currently taking the medication.  I discussed with her that she has had multiple investigations including an EGD, CT angio of the abdomen and pelvis with IV contrast, gastric emptying study all of which have been negative.  She has been moving her bowels adequately on a daily basis but even with bowel movement she is still having episodes of pain.  If this fails to provide any relief, can consider using Librax versus low-dose TCA.

## 2021-08-30 NOTE — Telephone Encounter (Signed)
Daughter called stated patient is having abd pain other than diarrhea - should she come in for an appointment or should she go to the ED  - please advise [phone]

## 2021-09-03 DIAGNOSIS — E1159 Type 2 diabetes mellitus with other circulatory complications: Secondary | ICD-10-CM | POA: Diagnosis not present

## 2021-09-03 DIAGNOSIS — I110 Hypertensive heart disease with heart failure: Secondary | ICD-10-CM | POA: Diagnosis not present

## 2021-09-03 DIAGNOSIS — D692 Other nonthrombocytopenic purpura: Secondary | ICD-10-CM | POA: Diagnosis not present

## 2021-09-03 DIAGNOSIS — K219 Gastro-esophageal reflux disease without esophagitis: Secondary | ICD-10-CM | POA: Diagnosis not present

## 2021-09-03 DIAGNOSIS — I509 Heart failure, unspecified: Secondary | ICD-10-CM | POA: Diagnosis not present

## 2021-09-03 DIAGNOSIS — E1169 Type 2 diabetes mellitus with other specified complication: Secondary | ICD-10-CM | POA: Diagnosis not present

## 2021-09-03 DIAGNOSIS — I25118 Atherosclerotic heart disease of native coronary artery with other forms of angina pectoris: Secondary | ICD-10-CM | POA: Diagnosis not present

## 2021-09-03 DIAGNOSIS — E785 Hyperlipidemia, unspecified: Secondary | ICD-10-CM | POA: Diagnosis not present

## 2021-09-03 DIAGNOSIS — Z6828 Body mass index (BMI) 28.0-28.9, adult: Secondary | ICD-10-CM | POA: Diagnosis not present

## 2021-09-14 DIAGNOSIS — Z299 Encounter for prophylactic measures, unspecified: Secondary | ICD-10-CM | POA: Diagnosis not present

## 2021-09-14 DIAGNOSIS — Z6830 Body mass index (BMI) 30.0-30.9, adult: Secondary | ICD-10-CM | POA: Diagnosis not present

## 2021-09-14 DIAGNOSIS — I10 Essential (primary) hypertension: Secondary | ICD-10-CM | POA: Diagnosis not present

## 2021-09-14 DIAGNOSIS — R6889 Other general symptoms and signs: Secondary | ICD-10-CM | POA: Diagnosis not present

## 2021-09-14 DIAGNOSIS — R109 Unspecified abdominal pain: Secondary | ICD-10-CM | POA: Diagnosis not present

## 2021-09-17 DIAGNOSIS — J329 Chronic sinusitis, unspecified: Secondary | ICD-10-CM | POA: Diagnosis not present

## 2021-09-17 DIAGNOSIS — Z6830 Body mass index (BMI) 30.0-30.9, adult: Secondary | ICD-10-CM | POA: Diagnosis not present

## 2021-09-17 DIAGNOSIS — Z299 Encounter for prophylactic measures, unspecified: Secondary | ICD-10-CM | POA: Diagnosis not present

## 2021-09-20 DIAGNOSIS — R0789 Other chest pain: Secondary | ICD-10-CM | POA: Diagnosis not present

## 2021-09-20 DIAGNOSIS — R002 Palpitations: Secondary | ICD-10-CM | POA: Diagnosis not present

## 2021-09-20 DIAGNOSIS — I10 Essential (primary) hypertension: Secondary | ICD-10-CM | POA: Diagnosis not present

## 2021-09-20 DIAGNOSIS — Z299 Encounter for prophylactic measures, unspecified: Secondary | ICD-10-CM | POA: Diagnosis not present

## 2021-09-20 DIAGNOSIS — R0600 Dyspnea, unspecified: Secondary | ICD-10-CM | POA: Diagnosis not present

## 2021-09-21 ENCOUNTER — Telehealth (INDEPENDENT_AMBULATORY_CARE_PROVIDER_SITE_OTHER): Payer: Self-pay | Admitting: *Deleted

## 2021-09-21 ENCOUNTER — Other Ambulatory Visit (INDEPENDENT_AMBULATORY_CARE_PROVIDER_SITE_OTHER): Payer: Self-pay | Admitting: Gastroenterology

## 2021-09-21 MED ORDER — DICYCLOMINE HCL 10 MG PO CAPS: 10.0000 mg | ORAL_CAPSULE | Freq: Three times a day (TID) | ORAL | 1 refills | Status: DC

## 2021-09-21 NOTE — Telephone Encounter (Signed)
Medication sent.  Thanks.

## 2021-09-21 NOTE — Telephone Encounter (Signed)
Discussed with pt's daughter and she would like rx for dicyclomine sent to [facility]

## 2021-09-21 NOTE — Telephone Encounter (Signed)
Sure, I'll send the Bentyl, she needs to stop hyoscyamine Thanks

## 2021-09-21 NOTE — Telephone Encounter (Signed)
Did she feel better with the hyoscyamine? If not I can send the Bentyl instead. May need to be seen in the office

## 2021-09-21 NOTE — Telephone Encounter (Signed)
Daughter called and states pt is taking hyoscamine was sent in on 11/7 for 10 tablets and is finishedwith that Rx States her stomach is sore today. Feels like if she could vomit she would feel better. States stomach has been sore for several days. Asking if she could have abdominal xray. ( I advised she would probably need office visit before xray could be ordered but told her I would ask) States Pcp recommended dicyclomine. In the past and she use to be on it but cannot remember why it was switched. If not able to take dicylomine she is asking for refill on hysocamine.   [facility].   Last Seen 06/24/21

## 2021-09-21 NOTE — Telephone Encounter (Signed)
Daughter states her stomach was still sore feeling with hysocamine. Would like to try dicyclomine again if she is able to take it.

## 2021-09-22 DIAGNOSIS — J449 Chronic obstructive pulmonary disease, unspecified: Secondary | ICD-10-CM | POA: Diagnosis not present

## 2021-09-22 DIAGNOSIS — I10 Essential (primary) hypertension: Secondary | ICD-10-CM | POA: Diagnosis not present

## 2021-09-27 ENCOUNTER — Telehealth (INDEPENDENT_AMBULATORY_CARE_PROVIDER_SITE_OTHER): Payer: Self-pay | Admitting: Gastroenterology

## 2021-09-27 DIAGNOSIS — K219 Gastro-esophageal reflux disease without esophagitis: Secondary | ICD-10-CM | POA: Diagnosis not present

## 2021-09-27 DIAGNOSIS — K589 Irritable bowel syndrome without diarrhea: Secondary | ICD-10-CM | POA: Diagnosis not present

## 2021-09-27 DIAGNOSIS — Z299 Encounter for prophylactic measures, unspecified: Secondary | ICD-10-CM | POA: Diagnosis not present

## 2021-09-27 DIAGNOSIS — I10 Essential (primary) hypertension: Secondary | ICD-10-CM | POA: Diagnosis not present

## 2021-09-27 NOTE — Telephone Encounter (Signed)
Spoke with the patient daughter, who reported the patient was presenting the same symptom of abdominal discomfort as she has complained multiple times but no new symptoms or changes in the severity of her discomfort.  Daughter agrees that her symptoms are likely related to her chronic IBS and they will try to manage it with her current medications.  Advised her to stop taking the meloxicam as this can also cause some gastrointestinal insult.  Maylon Peppers, MD Gastroenterology and Hepatology [facility]

## 2021-09-27 NOTE — Telephone Encounter (Signed)
Patient left voice mail message stating she would like to speak to you about her abd pain - please advise- ph# [phone]

## 2021-09-30 DIAGNOSIS — I10 Essential (primary) hypertension: Secondary | ICD-10-CM | POA: Diagnosis not present

## 2021-09-30 DIAGNOSIS — I700 Atherosclerosis of aorta: Secondary | ICD-10-CM | POA: Diagnosis not present

## 2021-09-30 DIAGNOSIS — E1165 Type 2 diabetes mellitus with hyperglycemia: Secondary | ICD-10-CM | POA: Diagnosis not present

## 2021-09-30 DIAGNOSIS — Z299 Encounter for prophylactic measures, unspecified: Secondary | ICD-10-CM | POA: Diagnosis not present

## 2021-09-30 DIAGNOSIS — D692 Other nonthrombocytopenic purpura: Secondary | ICD-10-CM | POA: Diagnosis not present

## 2021-10-08 DIAGNOSIS — Z6828 Body mass index (BMI) 28.0-28.9, adult: Secondary | ICD-10-CM | POA: Diagnosis not present

## 2021-10-08 DIAGNOSIS — M25551 Pain in right hip: Secondary | ICD-10-CM | POA: Diagnosis not present

## 2021-10-08 DIAGNOSIS — J069 Acute upper respiratory infection, unspecified: Secondary | ICD-10-CM | POA: Diagnosis not present

## 2021-10-08 DIAGNOSIS — Z299 Encounter for prophylactic measures, unspecified: Secondary | ICD-10-CM | POA: Diagnosis not present

## 2021-10-08 DIAGNOSIS — R42 Dizziness and giddiness: Secondary | ICD-10-CM | POA: Diagnosis not present

## 2021-10-22 DIAGNOSIS — I10 Essential (primary) hypertension: Secondary | ICD-10-CM | POA: Diagnosis not present

## 2021-10-24 ENCOUNTER — Other Ambulatory Visit (INDEPENDENT_AMBULATORY_CARE_PROVIDER_SITE_OTHER): Payer: Self-pay | Admitting: Gastroenterology

## 2021-10-26 NOTE — Telephone Encounter (Signed)
Last seen 06/24/21

## 2021-10-28 ENCOUNTER — Encounter (HOSPITAL_COMMUNITY): Payer: Self-pay | Admitting: *Deleted

## 2021-10-28 ENCOUNTER — Emergency Department (HOSPITAL_COMMUNITY)
Admission: EM | Admit: 2021-10-28 | Discharge: 2021-10-28 | Disposition: A | Discharge status: Home / Self Care | Payer: PPO | Attending: Emergency Medicine | Admitting: Emergency Medicine

## 2021-10-28 ENCOUNTER — Telehealth (INDEPENDENT_AMBULATORY_CARE_PROVIDER_SITE_OTHER): Payer: Self-pay

## 2021-10-28 DIAGNOSIS — R109 Unspecified abdominal pain: Secondary | ICD-10-CM | POA: Diagnosis not present

## 2021-10-28 DIAGNOSIS — Z5321 Procedure and treatment not carried out due to patient leaving prior to being seen by health care provider: Secondary | ICD-10-CM | POA: Diagnosis not present

## 2021-10-28 DIAGNOSIS — R197 Diarrhea, unspecified: Secondary | ICD-10-CM | POA: Diagnosis not present

## 2021-10-28 LAB — COMPREHENSIVE METABOLIC PANEL
ALT: 35 U/L (ref 0–44)
AST: 55 U/L — ABNORMAL HIGH (ref 15–41)
Albumin: 4 g/dL (ref 3.5–5.0)
Alkaline Phosphatase: 48 U/L (ref 38–126)
Anion gap: 14 (ref 5–15)
BUN: 13 mg/dL (ref 8–23)
CO2: 26 mmol/L (ref 22–32)
Calcium: 9.6 mg/dL (ref 8.9–10.3)
Chloride: 99 mmol/L (ref 98–111)
Creatinine, Ser: 0.96 mg/dL (ref 0.44–1.00)
GFR, Estimated: 57 mL/min — ABNORMAL LOW (ref >60)
Glucose, Bld: 129 mg/dL — ABNORMAL HIGH (ref 70–99)
Potassium: 3.3 mmol/L — ABNORMAL LOW (ref 3.5–5.1)
Sodium: 139 mmol/L (ref 135–145)
Total Bilirubin: 0.5 mg/dL (ref 0.3–1.2)
Total Protein: 7.2 g/dL (ref 6.5–8.1)

## 2021-10-28 LAB — CBC
HCT: 47.4 % — ABNORMAL HIGH (ref 36.0–46.0)
Hemoglobin: 16.5 g/dL — ABNORMAL HIGH (ref 12.0–15.0)
MCH: 30.6 pg (ref 26.0–34.0)
MCHC: 34.8 g/dL (ref 30.0–36.0)
MCV: 87.9 fL (ref 80.0–100.0)
Platelets: 228 10*3/uL (ref 150–400)
RBC: 5.39 MIL/uL — ABNORMAL HIGH (ref 3.87–5.11)
RDW: 14.4 % (ref 11.5–15.5)
WBC: 10 10*3/uL (ref 4.0–10.5)
nRBC: 0 % (ref 0.0–0.2)

## 2021-10-28 LAB — TYPE AND SCREEN
ABO/RH(D): A POS
Antibody Screen: NEGATIVE

## 2021-10-28 NOTE — ED Triage Notes (Signed)
Abdominal pain with diarrhea intermittent for 2 months

## 2021-10-28 NOTE — Telephone Encounter (Signed)
Patient called today stating she has had dark tarry stools for the last few days. She also has periumbilical pain that sometimes radiates to the left and right.She is also having abdominal swelling that corrected after a bm. She states she has had two bm's today. She denies any fevers or seeing bright red blood. Spoke with Dr. Jenetta Downer and he suggests the patient go to the Ed. Patient and her female family member state understanding and plan on going on to [facility].

## 2021-10-28 NOTE — Telephone Encounter (Signed)
Thanks, she also may need to set a follow up appointment if discharged from the ER

## 2021-10-29 NOTE — Telephone Encounter (Signed)
Patient daughter Sawn called and states they took her mother to [facility] last night as we recommended. She states the left Ama due to them being there for 4.5 hours with out being treated. She reports her mother could not sit any longer so they left. I did see they had preformed blood work which showed her Hgb being in the 16.5 and a slightly low K at 3.3. The daughter wanted to know if we could just do a ct scan on her mom without having her waiting in the Ed, or if she did go back to the Ed could we ask them to take her back sooner. I advised we normally do not do stat ct scans on the patient especially if she is having the issues she is having, as if it is something that needs to addressed immediately they would probably keep her in the hospital any way. I also advised that we could not call the Ed and tell them to see a patient before others as we do not know their patient load and the reason others are being seen as they could be worse off than her mother. Please advise, (we leave office at 11:30 AM) thanks.

## 2021-10-29 NOTE — Telephone Encounter (Signed)
Thanks, the fact that her hemoglobin is normal makes it unlikely she is having an active GI bleeding, which is reassuring. For the abdominal pain, it would be OK to do a CT scan to evaluate her acute symptoms (although she had one a year and a hafl ago which was normal). She needs to follow up in clinic.  Hi Darius Bump,   Can you please schedule a CT abdomen and pelvis with IV contrast? Dx: abdominal pain.  Thanks,  Maylon Peppers, MD Gastroenterology and Hepatology [facility]

## 2021-10-29 NOTE — Telephone Encounter (Signed)
I called and left a detailed message that the labs were reassuring for no GI bleed, someone will be in contact with them to set up a CT scan, patient will need to call in for an appointment here in the clinic. I advised that we would be closing at 11:30 am today.

## 2021-10-29 NOTE — Telephone Encounter (Signed)
Mitzie please schedule patient for a follow up appointment .  I spoke with the patient daughter she is aware of all and aware Mitzie will call her with an appointment. Aware someone will be in touch regarding getting CT scan.

## 2021-11-01 ENCOUNTER — Other Ambulatory Visit (INDEPENDENT_AMBULATORY_CARE_PROVIDER_SITE_OTHER): Payer: Self-pay

## 2021-11-01 DIAGNOSIS — R1032 Left lower quadrant pain: Secondary | ICD-10-CM

## 2021-11-01 NOTE — Telephone Encounter (Signed)
Thanks

## 2021-11-03 ENCOUNTER — Ambulatory Visit (INDEPENDENT_AMBULATORY_CARE_PROVIDER_SITE_OTHER): Payer: PPO | Admitting: Gastroenterology

## 2021-11-03 ENCOUNTER — Other Ambulatory Visit: Payer: Self-pay

## 2021-11-03 ENCOUNTER — Encounter (INDEPENDENT_AMBULATORY_CARE_PROVIDER_SITE_OTHER): Payer: Self-pay | Admitting: Gastroenterology

## 2021-11-03 VITALS — BP 122/69 | HR 69 | Temp 98.0°F | Ht 67.0 in | Wt 176.4 lb

## 2021-11-03 DIAGNOSIS — R112 Nausea with vomiting, unspecified: Secondary | ICD-10-CM

## 2021-11-03 DIAGNOSIS — K7460 Unspecified cirrhosis of liver: Secondary | ICD-10-CM | POA: Diagnosis not present

## 2021-11-03 DIAGNOSIS — K5909 Other constipation: Secondary | ICD-10-CM

## 2021-11-03 DIAGNOSIS — K581 Irritable bowel syndrome with constipation: Secondary | ICD-10-CM

## 2021-11-03 DIAGNOSIS — K7581 Nonalcoholic steatohepatitis (NASH): Secondary | ICD-10-CM | POA: Diagnosis not present

## 2021-11-03 MED ORDER — LINACLOTIDE 290 MCG PO CAPS: 290.0000 ug | ORAL_CAPSULE | Freq: Every day | ORAL | 3 refills | Status: DC

## 2021-11-03 MED ORDER — PEG 3350-KCL-NA BICARB-NACL 420 G PO SOLR: 4000.0000 mL | Freq: Once | ORAL | 0 refills | Status: AC

## 2021-11-03 NOTE — Progress Notes (Signed)
Maylon Peppers, M.D. Gastroenterology & Hepatology [facility] [address] [place], [place] [address]  Primary Care Physician: Glenda Chroman, MD [place] [id number]  I will communicate my assessment and recommendations to the referring MD via EMR.  Problems: IBS-C Compensated NASH cirrhosis  History of Present Illness: Heather [name] is a 88 y.o. female with past medical history of IBS-C, anxiety, GERD, hypertension, [name] cirrhosis, who presents for follow up of new episodes of diarrhea and chronic constipation.  The patient was last seen on 06/24/2021. At that time, the patient was advised to continue MiraLAX.  Patient comes to the office with her daughter.  Patient is not a good historian.  The patient reports that prior to December she was significantly constipated despite taking her Miralax every day, 2 capfuls compliantly.  With that regimen, she reported that she had a bowel movement every other day occasionally but not consistently.  The patient is concerned as she had some short bouts episodes of diarrhea in the last 2 months.  He states that these periods of time have been brief of probably 5 to 7 days in which she had multiple episodes of diarrhea.  She states that she had first episode in December which it is unclear if resolved on its own or if she took a medication for this.  States that in January 2nd she had diarrhea for three day, stopped for a few days and restarted two days ago. She states she had up to 5 bowel movements per day, but had significant urgency. States her bowel movements were multiple but they were little pebbles.  Notably, she stopped taking Miralax as she did not feel she was feeling relief , started Linzess every other day for 5 days (stopped as she had large amount of diarrhea).   She reports feeling very distended in her abdomen and has discomfort diffusely which usually goes along with  constipation episodes.  The patient denies having any nausea, vomiting, fever, chills, hematochezia, melena, hematemesis, diarrhea, jaundice, pruritus or weight loss.  Due to the symptoms and abdominal pain, the patient  called to the office and she was sent to the ER for further evaluation.  She was also endorsing melena episodes.  CBC performed that day at [facility] showed a hemoglobin of 16.5, platelets of 228 and white cell count of 10,000, CMP showed mildly decreased potassium of 3.370 139 rest of electrolytes within normal limits, AST was 55, ALT of 35, albumin 4.0 and total bilirubin of 0.5.  Most recent CT angio abdomen pelvis with IV contrast 07/08/2020 FINDINGS: VASCULAR   Aorta: Mild atherosclerotic changes of the lower thoracic and the abdominal aorta. No dissection. No periaortic fluid. No aneurysm. Celiac: Mild atherosclerosis at the origin of the celiac artery without high-grade stenosis or occlusion. Celiac artery contributes to typical splenic artery and common hepatic artery arrangement. Left gastric artery also arises from the splenic artery and appears stenotic at the origin similar to prior CT scans.  SMA: Mild atherosclerotic changes at the origin of the SMA without high-grade stenosis or occlusion. Distal branches are patent.  Renals: - Right: Single right renal artery with calcified atherosclerotic changes at the origin. Estimated 50% narrowing. - Left: Single left renal artery with pre hilar branch to the posterior and lower segment. Calcified atherosclerotic changes at the origin of the left renal artery without evidence of high-grade stenosis. IMA: IMA remains patent with atherosclerotic changes at the origin. Right  lower extremity: Unremarkable course, caliber, and contour of the right iliac system. No aneurysm, dissection, or occlusion. Minimal atherosclerosis. Hypogastric artery is patent. Common femoral artery patent. Proximal SFA and profunda femoris  patent. Left lower extremity: Unremarkable course, caliber, and contour of the left iliac system. No aneurysm, dissection, or occlusion. Minimal atherosclerosis. Hypogastric artery is patent. Common femoral artery patent. Proximal SFA and profunda femoris patent.  Veins: Unremarkable appearance of the venous system.   NON-VASCULAR Hepatobiliary: Similar appearance of the liver, with enlargement of caudate and left liver, potentially early cirrhotic change. No overt nodular changes. No focal liver lesion. Focal fatty sparing along the falciform ligament. Similar appearance of benign-appearing rounded lesion associated with the diaphragm/eleventh rib, posterior to segment 6. Unremarkable gallbladder. Pancreas: Relatively atrophic pancreas otherwise unremarkable. Spleen: Unremarkable Adrenals/Urinary Tract: - Right adrenal gland: Unremarkable - Left adrenal gland: Unremarkable. - Right kidney: No hydronephrosis, nephrolithiasis, inflammation, or ureteral dilation. No focal lesion. - Left Kidney: No hydronephrosis, nephrolithiasis, inflammation, or ureteral dilation. No focal lesion. - Urinary Bladder: Urinary bladder decompressed. Stomach/Bowel: - Stomach: Hiatal hernia.  Otherwise unremarkable stomach. - Small bowel: Decompressed small bowel. No focal wall thickening or inflammatory changes. No transition point. - Appendix: Normal. - Colon: Mild to moderate stool burden. No focal inflammatory changes. Diverticular disease of the sigmoid colon. No focal inflammatory changes. Interval resolution of formed stool within the rectum. Lymphatic: No adenopathy. Mesenteric: No free fluid or air. No mesenteric adenopathy. Reproductive: Unremarkable appearance of the pelvic organs. Other: Nonspecific pelvic floor laxity Musculoskeletal: Osteopenia. Post interventional changes of T12 vertebral augmentation. Multilevel degenerative changes including vacuum disc phenomenon at L1-L2 and L5-S1. Interosseous  hemangioma of L4. No bony canal narrowing.  No acute displaced fracture  Last Colonoscopy:08/22/12 moderate diverticulosis in sigmoid, two polyps in sigmoid (tubular adenoma)   Last Endoscopy:04/09/20- Normal hypopharynx. - Normal esophagus. - Z-line irregular, 38 cm from the incisors. - Erythematous mucosa in the antrum and prepyloric region of the stomach. (Gastric antral mucosa with non specific reactive gastropathy. - Normal duodenal bulb and second portion of the duodenum.    Past Medical History: Past Medical History:  Diagnosis Date   Anxiety    GERD (gastroesophageal reflux disease)    Hx of shoulder surgery    Left Shoulder - pitched nerve.   Other and unspecified hyperlipidemia    Unspecified essential hypertension     Past Surgical History: Past Surgical History:  Procedure Laterality Date   BIOPSY  04/09/2020   Procedure: BIOPSY;  Surgeon: Rogene Houston, MD;  Location: [facility];  Service: Endoscopy;;   CERVICAL [place]   colonscopy  08/22/2012   moderate diverticulosos in sigmoid and two tubular adenomas   ESOPHAGOGASTRODUODENOSCOPY N/A 04/09/2020   [facility]:Normal hypopharynx.normal esophagus, z line irreg, 38 cm from incisors, erythematous mucosa in antrum and preplyoric region, gastric antral mucosa with non specific reactive gastropathy, normal duodenal bul and second portion of duodenum   History of shoulder surgery Left    pitched nerve.    Family History: Family History  Problem Relation Age of Onset   Hypertension Other    Heart attack Sister     Social History: Social History   Tobacco Use  Smoking Status Never  Smokeless Tobacco Never   Social History   Substance and Sexual Activity  Alcohol Use No   Alcohol/week: 0.0 standard drinks   Social History   Substance and Sexual Activity  Drug Use No  Allergies: No Known Allergies  Medications: Current Outpatient Medications  Medication Sig  Dispense Refill   albuterol (VENTOLIN HFA) 108 (90 Base) MCG/ACT inhaler Inhale 2 puffs into the lungs every 4 (four) hours as needed for wheezing or shortness of breath.      ALPRAZolam (XANAX) 1 MG tablet Take 1 mg by mouth 2 (two) times daily.      amLODipine (NORVASC) 10 MG tablet Take 10 mg by mouth every morning.   12   Ascorbic Acid (VITAMIN C WITH ROSE HIPS) 500 MG tablet Take 500 mg by mouth daily.     aspirin 81 MG tablet Take 2 tablets (162 mg total) by mouth in the morning and at bedtime. 30 tablet    Calcium-Magnesium-Zinc (CAL-MAG-ZINC PO) Take 1 tablet by mouth daily.     chlorthalidone (HYGROTON) 25 MG tablet Take 25 mg by mouth daily.     dicyclomine (BENTYL) 10 MG capsule TAKE ONE CAPSULE BY MOUTH FOUR TIMES DAILY BEFORE MEALS and AT BEDTIME 90 capsule 1   furosemide (LASIX) 40 MG tablet Take 40 mg by mouth daily.     meloxicam (MOBIC) 7.5 MG tablet Take 7.5 mg by mouth daily.     metoprolol tartrate (LOPRESSOR) 25 MG tablet Take 25 mg by mouth 2 (two) times daily.  3   Naphazoline-Pheniramine (OPCON-A) 0.027-0.315 % SOLN Place 1 drop into both eyes 2 (two) times daily as needed (dry eyes).     nitroGLYCERIN (NITROSTAT) 0.4 MG SL tablet Place 0.4 mg under the tongue every 5 (five) minutes as needed for chest pain.     omeprazole (PRILOSEC) 40 MG capsule Take 40 mg by mouth daily.     Polyethylene Glycol 3350 (MIRALAX PO) Take by mouth.     polyethylene glycol-electrolytes (NULYTELY) 420 g solution Take 4,000 mLs by mouth once for 1 dose. 4000 mL 0   potassium chloride (KLOR-CON) 20 MEQ packet Take 20 mEq by mouth daily.     VITAMIN D PO Take 1 capsule by mouth daily.      linaclotide (LINZESS) 290 MCG CAPS capsule Take 1 capsule (290 mcg total) by mouth daily before breakfast. 90 capsule 3   No current facility-administered medications for this visit.    Review of Systems: GENERAL: negative for malaise, night sweats HEENT: No changes in hearing or vision, no nose bleeds or  other nasal problems. NECK: Negative for lumps, goiter, pain and significant neck swelling RESPIRATORY: Negative for cough, wheezing CARDIOVASCULAR: Negative for chest pain, leg swelling, palpitations, orthopnea GI: SEE HPI MUSCULOSKELETAL: Negative for joint pain or swelling, back pain, and muscle pain. SKIN: Negative for lesions, rash PSYCH: Negative for sleep disturbance, mood disorder and recent psychosocial stressors. HEMATOLOGY Negative for prolonged bleeding, bruising easily, and swollen nodes. ENDOCRINE: Negative for cold or heat intolerance, polyuria, polydipsia and goiter. NEURO: negative for tremor, gait imbalance, syncope and seizures. The remainder of the review of systems is noncontributory.   Physical Exam: BP 122/69 (BP Location: Left Arm, Patient Position: Sitting, Cuff Size: Large)    Pulse 69    Temp 98 F (36.7 C) (Oral)    Ht 5\' 7"  (1.702 m)    Wt 176 lb 6.4 oz (80 kg)    BMI 27.63 kg/m  GENERAL: The patient is AO x3, in no acute distress. Elder. HEENT: Head is normocephalic and atraumatic. EOMI are intact. Mouth is well hydrated and without lesions. NECK: Supple. No masses LUNGS: Clear to auscultation. No presence of rhonchi/wheezing/rales. Adequate chest expansion HEART:  RRR, normal s1 and s2. ABDOMEN: Soft, nontender, no guarding, no peritoneal signs, and midly distended diffusely. BS +. No masses. EXTREMITIES: Without any cyanosis, clubbing, rash, lesions or edema. NEUROLOGIC: AOx3, no focal motor deficit. SKIN: no jaundice, no rashes  Imaging/Labs: as above  I personally reviewed and interpreted the available labs, imaging and endoscopic files.  Impression and Plan: Heather [name] is a 88 y.o. female with past medical history of IBS-C, anxiety, GERD, hypertension, [name] cirrhosis, who presents for follow up of new episodes of diarrhea and chronic constipation.  The patient has presented chronic history of constipation with a few short episodes of  diarrhea of recent onset.  She is not a good historian but given the timeline of symptoms she describes, it seems that her primary problem is significant constipation due to IBS-C which may be leading to overflow diarrhea -in fact, her most recent episodes of "diarrhea" were described as pebble shaped stools after she took [place].  I had a thorough discussion with the patient and the daughter regarding the need to improve her bowel movement frequency.  She had evidence of significant constipation in a CT angio in 2021.  We will give her a bowel prep today and advised her to start taking the Linzess to 90 mcg every day.  If she presents persistent diarrhea we can decrease the dose of the Linzess if needed.  I agree that she may need to have her schedule CT scan performed to rule out any other causes of her current symptoms.  On the other hand, the patient presented chronically get elevated liver enzymes due to  NASH.  Her most recent Fib-4 score was 3.59 - consistent with F3-4 fibrosis.  Due to this, I consider she may have early cirrhosis without decompensating events (ascites, encephalopathy or GI bleeding).  We will check MELD labs and AFP today.  These labs will need to be checked every 6 months.  - Proceed with scheduled abdominal CT - Take bowel prep in the following two days - Start Linzess 290 mcg qday - Check MELD labs, AFP and TSH  All questions were answered.      Harvel Quale, MD Gastroenterology and Hepatology [facility]

## 2021-11-03 NOTE — Patient Instructions (Addendum)
Proceed with scheduled abdominal CT Take bowel prep in the following two days Start Linzess 290 mcg qday Perform blood workup

## 2021-11-04 LAB — TSH: TSH: 0.86 mIU/L (ref 0.40–4.50)

## 2021-11-04 LAB — COMPREHENSIVE METABOLIC PANEL
AG Ratio: 1.5 (calc) (ref 1.0–2.5)
ALT: 35 U/L — ABNORMAL HIGH (ref 6–29)
AST: 53 U/L — ABNORMAL HIGH (ref 10–35)
Albumin: 4.4 g/dL (ref 3.6–5.1)
Alkaline phosphatase (APISO): 49 U/L (ref 37–153)
BUN: 15 mg/dL (ref 7–25)
CO2: 25 mmol/L (ref 20–32)
Calcium: 10.3 mg/dL (ref 8.6–10.4)
Chloride: 101 mmol/L (ref 98–110)
Creat: 0.92 mg/dL (ref 0.60–0.95)
Globulin: 3 g/dL (calc) (ref 1.9–3.7)
Glucose, Bld: 97 mg/dL (ref 65–99)
Potassium: 3.9 mmol/L (ref 3.5–5.3)
Sodium: 140 mmol/L (ref 135–146)
Total Bilirubin: 0.5 mg/dL (ref 0.2–1.2)
Total Protein: 7.4 g/dL (ref 6.1–8.1)

## 2021-11-04 LAB — PROTIME-INR
INR: 1
Prothrombin Time: 9.8 s (ref 9.0–11.5)

## 2021-11-04 LAB — AFP TUMOR MARKER: AFP-Tumor Marker: 8.2 ng/mL — ABNORMAL HIGH

## 2021-11-05 DIAGNOSIS — R5383 Other fatigue: Secondary | ICD-10-CM | POA: Diagnosis not present

## 2021-11-05 DIAGNOSIS — Z0000 Encounter for general adult medical examination without abnormal findings: Secondary | ICD-10-CM | POA: Diagnosis not present

## 2021-11-05 DIAGNOSIS — Z1339 Encounter for screening examination for other mental health and behavioral disorders: Secondary | ICD-10-CM | POA: Diagnosis not present

## 2021-11-05 DIAGNOSIS — Z6829 Body mass index (BMI) 29.0-29.9, adult: Secondary | ICD-10-CM | POA: Diagnosis not present

## 2021-11-05 DIAGNOSIS — Z299 Encounter for prophylactic measures, unspecified: Secondary | ICD-10-CM | POA: Diagnosis not present

## 2021-11-05 DIAGNOSIS — Z7189 Other specified counseling: Secondary | ICD-10-CM | POA: Diagnosis not present

## 2021-11-05 DIAGNOSIS — Z789 Other specified health status: Secondary | ICD-10-CM | POA: Diagnosis not present

## 2021-11-05 DIAGNOSIS — E1165 Type 2 diabetes mellitus with hyperglycemia: Secondary | ICD-10-CM | POA: Diagnosis not present

## 2021-11-05 DIAGNOSIS — Z79899 Other long term (current) drug therapy: Secondary | ICD-10-CM | POA: Diagnosis not present

## 2021-11-05 DIAGNOSIS — E7800 Pure hypercholesterolemia, unspecified: Secondary | ICD-10-CM | POA: Diagnosis not present

## 2021-11-05 DIAGNOSIS — Z1331 Encounter for screening for depression: Secondary | ICD-10-CM | POA: Diagnosis not present

## 2021-11-05 DIAGNOSIS — I10 Essential (primary) hypertension: Secondary | ICD-10-CM | POA: Diagnosis not present

## 2021-11-12 ENCOUNTER — Other Ambulatory Visit: Payer: Self-pay

## 2021-11-12 ENCOUNTER — Encounter (HOSPITAL_COMMUNITY): Payer: Self-pay

## 2021-11-12 ENCOUNTER — Ambulatory Visit (HOSPITAL_COMMUNITY)
Admission: RE | Admit: 2021-11-12 | Discharge: 2021-11-12 | Disposition: A | Discharge status: Home / Self Care | Payer: PPO | Source: Ambulatory Visit | Attending: Gastroenterology | Admitting: Gastroenterology

## 2021-11-12 DIAGNOSIS — R109 Unspecified abdominal pain: Secondary | ICD-10-CM | POA: Diagnosis not present

## 2021-11-12 DIAGNOSIS — R1032 Left lower quadrant pain: Secondary | ICD-10-CM | POA: Diagnosis not present

## 2021-11-12 MED ORDER — IOHEXOL 300 MG/ML  SOLN
85.0000 mL | Freq: Once | INTRAMUSCULAR | Status: AC | PRN
  Administered 2021-11-12: 85 mL via INTRAVENOUS

## 2021-11-15 DIAGNOSIS — E2839 Other primary ovarian failure: Secondary | ICD-10-CM | POA: Diagnosis not present

## 2021-11-21 DIAGNOSIS — I10 Essential (primary) hypertension: Secondary | ICD-10-CM | POA: Diagnosis not present

## 2021-12-09 DIAGNOSIS — I25119 Atherosclerotic heart disease of native coronary artery with unspecified angina pectoris: Secondary | ICD-10-CM | POA: Diagnosis not present

## 2021-12-09 DIAGNOSIS — J9611 Chronic respiratory failure with hypoxia: Secondary | ICD-10-CM | POA: Diagnosis not present

## 2021-12-09 DIAGNOSIS — R109 Unspecified abdominal pain: Secondary | ICD-10-CM | POA: Diagnosis not present

## 2021-12-09 DIAGNOSIS — Z299 Encounter for prophylactic measures, unspecified: Secondary | ICD-10-CM | POA: Diagnosis not present

## 2021-12-09 DIAGNOSIS — I700 Atherosclerosis of aorta: Secondary | ICD-10-CM | POA: Diagnosis not present

## 2021-12-14 DIAGNOSIS — M549 Dorsalgia, unspecified: Secondary | ICD-10-CM | POA: Diagnosis not present

## 2021-12-14 DIAGNOSIS — Z789 Other specified health status: Secondary | ICD-10-CM | POA: Diagnosis not present

## 2021-12-14 DIAGNOSIS — Z6829 Body mass index (BMI) 29.0-29.9, adult: Secondary | ICD-10-CM | POA: Diagnosis not present

## 2021-12-14 DIAGNOSIS — Z299 Encounter for prophylactic measures, unspecified: Secondary | ICD-10-CM | POA: Diagnosis not present

## 2021-12-14 DIAGNOSIS — R42 Dizziness and giddiness: Secondary | ICD-10-CM | POA: Diagnosis not present

## 2021-12-17 DIAGNOSIS — Z299 Encounter for prophylactic measures, unspecified: Secondary | ICD-10-CM | POA: Diagnosis not present

## 2021-12-17 DIAGNOSIS — I10 Essential (primary) hypertension: Secondary | ICD-10-CM | POA: Diagnosis not present

## 2021-12-17 DIAGNOSIS — I700 Atherosclerosis of aorta: Secondary | ICD-10-CM | POA: Diagnosis not present

## 2021-12-17 DIAGNOSIS — N390 Urinary tract infection, site not specified: Secondary | ICD-10-CM | POA: Diagnosis not present

## 2021-12-17 DIAGNOSIS — M545 Low back pain, unspecified: Secondary | ICD-10-CM | POA: Diagnosis not present

## 2021-12-17 DIAGNOSIS — I25119 Atherosclerotic heart disease of native coronary artery with unspecified angina pectoris: Secondary | ICD-10-CM | POA: Diagnosis not present

## 2021-12-18 DIAGNOSIS — X58XXXD Exposure to other specified factors, subsequent encounter: Secondary | ICD-10-CM | POA: Diagnosis not present

## 2021-12-18 DIAGNOSIS — F419 Anxiety disorder, unspecified: Secondary | ICD-10-CM | POA: Diagnosis not present

## 2021-12-18 DIAGNOSIS — M549 Dorsalgia, unspecified: Secondary | ICD-10-CM | POA: Diagnosis not present

## 2021-12-18 DIAGNOSIS — R2689 Other abnormalities of gait and mobility: Secondary | ICD-10-CM | POA: Diagnosis not present

## 2021-12-18 DIAGNOSIS — K5900 Constipation, unspecified: Secondary | ICD-10-CM | POA: Diagnosis not present

## 2021-12-18 DIAGNOSIS — R52 Pain, unspecified: Secondary | ICD-10-CM | POA: Diagnosis not present

## 2021-12-18 DIAGNOSIS — I10 Essential (primary) hypertension: Secondary | ICD-10-CM | POA: Diagnosis not present

## 2021-12-18 DIAGNOSIS — K581 Irritable bowel syndrome with constipation: Secondary | ICD-10-CM | POA: Diagnosis not present

## 2021-12-18 DIAGNOSIS — S32020D Wedge compression fracture of second lumbar vertebra, subsequent encounter for fracture with routine healing: Secondary | ICD-10-CM | POA: Diagnosis not present

## 2021-12-18 DIAGNOSIS — R41841 Cognitive communication deficit: Secondary | ICD-10-CM | POA: Diagnosis not present

## 2021-12-18 DIAGNOSIS — I700 Atherosclerosis of aorta: Secondary | ICD-10-CM | POA: Diagnosis not present

## 2021-12-18 DIAGNOSIS — R262 Difficulty in walking, not elsewhere classified: Secondary | ICD-10-CM | POA: Diagnosis not present

## 2021-12-18 DIAGNOSIS — M5126 Other intervertebral disc displacement, lumbar region: Secondary | ICD-10-CM | POA: Diagnosis not present

## 2021-12-18 DIAGNOSIS — W19XXXD Unspecified fall, subsequent encounter: Secondary | ICD-10-CM | POA: Diagnosis not present

## 2021-12-18 DIAGNOSIS — Z7982 Long term (current) use of aspirin: Secondary | ICD-10-CM | POA: Diagnosis not present

## 2021-12-18 DIAGNOSIS — M545 Low back pain, unspecified: Secondary | ICD-10-CM | POA: Diagnosis not present

## 2021-12-18 DIAGNOSIS — K760 Fatty (change of) liver, not elsewhere classified: Secondary | ICD-10-CM | POA: Diagnosis not present

## 2021-12-18 DIAGNOSIS — S32029A Unspecified fracture of second lumbar vertebra, initial encounter for closed fracture: Secondary | ICD-10-CM | POA: Diagnosis not present

## 2021-12-18 DIAGNOSIS — M6281 Muscle weakness (generalized): Secondary | ICD-10-CM | POA: Diagnosis not present

## 2021-12-18 DIAGNOSIS — E876 Hypokalemia: Secondary | ICD-10-CM | POA: Diagnosis not present

## 2021-12-18 DIAGNOSIS — Z79899 Other long term (current) drug therapy: Secondary | ICD-10-CM | POA: Diagnosis not present

## 2021-12-18 DIAGNOSIS — Z742 Need for assistance at home and no other household member able to render care: Secondary | ICD-10-CM | POA: Diagnosis not present

## 2021-12-18 DIAGNOSIS — R42 Dizziness and giddiness: Secondary | ICD-10-CM | POA: Diagnosis not present

## 2021-12-18 DIAGNOSIS — R54 Age-related physical debility: Secondary | ICD-10-CM | POA: Diagnosis not present

## 2021-12-18 DIAGNOSIS — M5136 Other intervertebral disc degeneration, lumbar region: Secondary | ICD-10-CM | POA: Diagnosis not present

## 2021-12-18 DIAGNOSIS — R109 Unspecified abdominal pain: Secondary | ICD-10-CM | POA: Diagnosis not present

## 2021-12-18 DIAGNOSIS — K589 Irritable bowel syndrome without diarrhea: Secondary | ICD-10-CM | POA: Diagnosis not present

## 2021-12-18 DIAGNOSIS — Z20822 Contact with and (suspected) exposure to covid-19: Secondary | ICD-10-CM | POA: Diagnosis not present

## 2021-12-18 DIAGNOSIS — E785 Hyperlipidemia, unspecified: Secondary | ICD-10-CM | POA: Diagnosis not present

## 2021-12-18 DIAGNOSIS — R531 Weakness: Secondary | ICD-10-CM | POA: Diagnosis not present

## 2021-12-18 DIAGNOSIS — K219 Gastro-esophageal reflux disease without esophagitis: Secondary | ICD-10-CM | POA: Diagnosis not present

## 2021-12-18 DIAGNOSIS — S32020A Wedge compression fracture of second lumbar vertebra, initial encounter for closed fracture: Secondary | ICD-10-CM | POA: Diagnosis not present

## 2021-12-18 DIAGNOSIS — H3530 Unspecified macular degeneration: Secondary | ICD-10-CM | POA: Diagnosis not present

## 2021-12-18 DIAGNOSIS — M48061 Spinal stenosis, lumbar region without neurogenic claudication: Secondary | ICD-10-CM | POA: Diagnosis not present

## 2021-12-19 DIAGNOSIS — Z79899 Other long term (current) drug therapy: Secondary | ICD-10-CM | POA: Diagnosis not present

## 2021-12-19 DIAGNOSIS — S32020D Wedge compression fracture of second lumbar vertebra, subsequent encounter for fracture with routine healing: Secondary | ICD-10-CM | POA: Diagnosis not present

## 2021-12-19 DIAGNOSIS — W19XXXD Unspecified fall, subsequent encounter: Secondary | ICD-10-CM | POA: Diagnosis not present

## 2021-12-20 DIAGNOSIS — M5136 Other intervertebral disc degeneration, lumbar region: Secondary | ICD-10-CM | POA: Diagnosis not present

## 2021-12-20 DIAGNOSIS — M5126 Other intervertebral disc displacement, lumbar region: Secondary | ICD-10-CM | POA: Diagnosis not present

## 2021-12-20 DIAGNOSIS — M48061 Spinal stenosis, lumbar region without neurogenic claudication: Secondary | ICD-10-CM | POA: Diagnosis not present

## 2021-12-21 ENCOUNTER — Other Ambulatory Visit (INDEPENDENT_AMBULATORY_CARE_PROVIDER_SITE_OTHER): Payer: Self-pay | Admitting: Gastroenterology

## 2021-12-21 DIAGNOSIS — I10 Essential (primary) hypertension: Secondary | ICD-10-CM | POA: Diagnosis not present

## 2021-12-21 NOTE — Telephone Encounter (Signed)
Seen 11/03/21 and has appt in april

## 2021-12-28 DIAGNOSIS — M6281 Muscle weakness (generalized): Secondary | ICD-10-CM | POA: Diagnosis not present

## 2021-12-28 DIAGNOSIS — E1165 Type 2 diabetes mellitus with hyperglycemia: Secondary | ICD-10-CM | POA: Diagnosis not present

## 2021-12-28 DIAGNOSIS — K5900 Constipation, unspecified: Secondary | ICD-10-CM | POA: Diagnosis not present

## 2021-12-28 DIAGNOSIS — S32020D Wedge compression fracture of second lumbar vertebra, subsequent encounter for fracture with routine healing: Secondary | ICD-10-CM | POA: Diagnosis not present

## 2021-12-28 DIAGNOSIS — R41841 Cognitive communication deficit: Secondary | ICD-10-CM | POA: Diagnosis not present

## 2021-12-28 DIAGNOSIS — K760 Fatty (change of) liver, not elsewhere classified: Secondary | ICD-10-CM | POA: Diagnosis not present

## 2021-12-28 DIAGNOSIS — M545 Low back pain, unspecified: Secondary | ICD-10-CM | POA: Diagnosis not present

## 2021-12-28 DIAGNOSIS — F411 Generalized anxiety disorder: Secondary | ICD-10-CM | POA: Diagnosis not present

## 2021-12-28 DIAGNOSIS — S22029A Unspecified fracture of second thoracic vertebra, initial encounter for closed fracture: Secondary | ICD-10-CM | POA: Diagnosis not present

## 2021-12-28 DIAGNOSIS — R54 Age-related physical debility: Secondary | ICD-10-CM | POA: Diagnosis not present

## 2021-12-28 DIAGNOSIS — F419 Anxiety disorder, unspecified: Secondary | ICD-10-CM | POA: Diagnosis not present

## 2021-12-28 DIAGNOSIS — K219 Gastro-esophageal reflux disease without esophagitis: Secondary | ICD-10-CM | POA: Diagnosis not present

## 2021-12-28 DIAGNOSIS — E785 Hyperlipidemia, unspecified: Secondary | ICD-10-CM | POA: Diagnosis not present

## 2021-12-28 DIAGNOSIS — M549 Dorsalgia, unspecified: Secondary | ICD-10-CM | POA: Diagnosis not present

## 2021-12-28 DIAGNOSIS — M179 Osteoarthritis of knee, unspecified: Secondary | ICD-10-CM | POA: Diagnosis not present

## 2021-12-28 DIAGNOSIS — Z299 Encounter for prophylactic measures, unspecified: Secondary | ICD-10-CM | POA: Diagnosis not present

## 2021-12-28 DIAGNOSIS — R109 Unspecified abdominal pain: Secondary | ICD-10-CM | POA: Diagnosis not present

## 2021-12-28 DIAGNOSIS — R262 Difficulty in walking, not elsewhere classified: Secondary | ICD-10-CM | POA: Diagnosis not present

## 2021-12-28 DIAGNOSIS — S32020A Wedge compression fracture of second lumbar vertebra, initial encounter for closed fracture: Secondary | ICD-10-CM | POA: Diagnosis not present

## 2021-12-28 DIAGNOSIS — I10 Essential (primary) hypertension: Secondary | ICD-10-CM | POA: Diagnosis not present

## 2021-12-28 DIAGNOSIS — R2689 Other abnormalities of gait and mobility: Secondary | ICD-10-CM | POA: Diagnosis not present

## 2021-12-28 DIAGNOSIS — K589 Irritable bowel syndrome without diarrhea: Secondary | ICD-10-CM | POA: Diagnosis not present

## 2021-12-28 DIAGNOSIS — E876 Hypokalemia: Secondary | ICD-10-CM | POA: Diagnosis not present

## 2021-12-28 DIAGNOSIS — M7071 Other bursitis of hip, right hip: Secondary | ICD-10-CM | POA: Diagnosis not present

## 2021-12-28 DIAGNOSIS — M4850XA Collapsed vertebra, not elsewhere classified, site unspecified, initial encounter for fracture: Secondary | ICD-10-CM | POA: Diagnosis not present

## 2021-12-28 DIAGNOSIS — J449 Chronic obstructive pulmonary disease, unspecified: Secondary | ICD-10-CM | POA: Diagnosis not present

## 2021-12-30 DIAGNOSIS — Z299 Encounter for prophylactic measures, unspecified: Secondary | ICD-10-CM | POA: Diagnosis not present

## 2021-12-30 DIAGNOSIS — S22029A Unspecified fracture of second thoracic vertebra, initial encounter for closed fracture: Secondary | ICD-10-CM | POA: Diagnosis not present

## 2021-12-30 DIAGNOSIS — J449 Chronic obstructive pulmonary disease, unspecified: Secondary | ICD-10-CM | POA: Diagnosis not present

## 2021-12-30 DIAGNOSIS — E1165 Type 2 diabetes mellitus with hyperglycemia: Secondary | ICD-10-CM | POA: Diagnosis not present

## 2021-12-30 DIAGNOSIS — I10 Essential (primary) hypertension: Secondary | ICD-10-CM | POA: Diagnosis not present

## 2022-01-04 DIAGNOSIS — I10 Essential (primary) hypertension: Secondary | ICD-10-CM | POA: Diagnosis not present

## 2022-01-04 DIAGNOSIS — F419 Anxiety disorder, unspecified: Secondary | ICD-10-CM | POA: Diagnosis not present

## 2022-01-04 DIAGNOSIS — M4850XA Collapsed vertebra, not elsewhere classified, site unspecified, initial encounter for fracture: Secondary | ICD-10-CM | POA: Diagnosis not present

## 2022-01-04 DIAGNOSIS — Z299 Encounter for prophylactic measures, unspecified: Secondary | ICD-10-CM | POA: Diagnosis not present

## 2022-01-11 DIAGNOSIS — Z299 Encounter for prophylactic measures, unspecified: Secondary | ICD-10-CM | POA: Diagnosis not present

## 2022-01-11 DIAGNOSIS — I10 Essential (primary) hypertension: Secondary | ICD-10-CM | POA: Diagnosis not present

## 2022-01-11 DIAGNOSIS — M179 Osteoarthritis of knee, unspecified: Secondary | ICD-10-CM | POA: Diagnosis not present

## 2022-01-11 DIAGNOSIS — M4850XA Collapsed vertebra, not elsewhere classified, site unspecified, initial encounter for fracture: Secondary | ICD-10-CM | POA: Diagnosis not present

## 2022-01-14 DIAGNOSIS — Z299 Encounter for prophylactic measures, unspecified: Secondary | ICD-10-CM | POA: Diagnosis not present

## 2022-01-14 DIAGNOSIS — M179 Osteoarthritis of knee, unspecified: Secondary | ICD-10-CM | POA: Diagnosis not present

## 2022-01-14 DIAGNOSIS — I10 Essential (primary) hypertension: Secondary | ICD-10-CM | POA: Diagnosis not present

## 2022-01-14 DIAGNOSIS — M7071 Other bursitis of hip, right hip: Secondary | ICD-10-CM | POA: Diagnosis not present

## 2022-01-14 DIAGNOSIS — E1165 Type 2 diabetes mellitus with hyperglycemia: Secondary | ICD-10-CM | POA: Diagnosis not present

## 2022-01-18 DIAGNOSIS — M4850XA Collapsed vertebra, not elsewhere classified, site unspecified, initial encounter for fracture: Secondary | ICD-10-CM | POA: Diagnosis not present

## 2022-01-18 DIAGNOSIS — Z299 Encounter for prophylactic measures, unspecified: Secondary | ICD-10-CM | POA: Diagnosis not present

## 2022-01-18 DIAGNOSIS — I10 Essential (primary) hypertension: Secondary | ICD-10-CM | POA: Diagnosis not present

## 2022-01-19 DIAGNOSIS — M545 Low back pain, unspecified: Secondary | ICD-10-CM | POA: Diagnosis not present

## 2022-01-21 DIAGNOSIS — J449 Chronic obstructive pulmonary disease, unspecified: Secondary | ICD-10-CM | POA: Diagnosis not present

## 2022-01-21 DIAGNOSIS — Z7951 Long term (current) use of inhaled steroids: Secondary | ICD-10-CM | POA: Diagnosis not present

## 2022-01-21 DIAGNOSIS — E785 Hyperlipidemia, unspecified: Secondary | ICD-10-CM | POA: Diagnosis not present

## 2022-01-21 DIAGNOSIS — D692 Other nonthrombocytopenic purpura: Secondary | ICD-10-CM | POA: Diagnosis not present

## 2022-01-21 DIAGNOSIS — E1159 Type 2 diabetes mellitus with other circulatory complications: Secondary | ICD-10-CM | POA: Diagnosis not present

## 2022-01-21 DIAGNOSIS — I10 Essential (primary) hypertension: Secondary | ICD-10-CM | POA: Diagnosis not present

## 2022-01-21 DIAGNOSIS — I25118 Atherosclerotic heart disease of native coronary artery with other forms of angina pectoris: Secondary | ICD-10-CM | POA: Diagnosis not present

## 2022-01-21 DIAGNOSIS — E1169 Type 2 diabetes mellitus with other specified complication: Secondary | ICD-10-CM | POA: Diagnosis not present

## 2022-01-21 DIAGNOSIS — Z6828 Body mass index (BMI) 28.0-28.9, adult: Secondary | ICD-10-CM | POA: Diagnosis not present

## 2022-01-21 DIAGNOSIS — I509 Heart failure, unspecified: Secondary | ICD-10-CM | POA: Diagnosis not present

## 2022-01-26 ENCOUNTER — Encounter (HOSPITAL_COMMUNITY): Payer: Self-pay | Admitting: Emergency Medicine

## 2022-01-26 ENCOUNTER — Inpatient Hospital Stay (HOSPITAL_COMMUNITY)
Admission: EM | Admit: 2022-01-26 | Discharge: 2022-01-28 | DRG: 871 | Disposition: A | Discharge status: Nursing Home (Includes ICF) | Payer: PPO | Source: Skilled Nursing Facility | Attending: Family Medicine | Admitting: Family Medicine

## 2022-01-26 DIAGNOSIS — K449 Diaphragmatic hernia without obstruction or gangrene: Secondary | ICD-10-CM | POA: Diagnosis present

## 2022-01-26 DIAGNOSIS — R1111 Vomiting without nausea: Secondary | ICD-10-CM | POA: Diagnosis not present

## 2022-01-26 DIAGNOSIS — R079 Chest pain, unspecified: Secondary | ICD-10-CM | POA: Diagnosis not present

## 2022-01-26 DIAGNOSIS — R778 Other specified abnormalities of plasma proteins: Secondary | ICD-10-CM | POA: Diagnosis not present

## 2022-01-26 DIAGNOSIS — J189 Pneumonia, unspecified organism: Secondary | ICD-10-CM | POA: Diagnosis not present

## 2022-01-26 DIAGNOSIS — E876 Hypokalemia: Secondary | ICD-10-CM | POA: Diagnosis not present

## 2022-01-26 DIAGNOSIS — S32020A Wedge compression fracture of second lumbar vertebra, initial encounter for closed fracture: Secondary | ICD-10-CM | POA: Diagnosis not present

## 2022-01-26 DIAGNOSIS — K7581 Nonalcoholic steatohepatitis (NASH): Secondary | ICD-10-CM | POA: Diagnosis not present

## 2022-01-26 DIAGNOSIS — K581 Irritable bowel syndrome with constipation: Secondary | ICD-10-CM | POA: Diagnosis not present

## 2022-01-26 DIAGNOSIS — E785 Hyperlipidemia, unspecified: Secondary | ICD-10-CM | POA: Diagnosis not present

## 2022-01-26 DIAGNOSIS — K219 Gastro-esophageal reflux disease without esophagitis: Secondary | ICD-10-CM | POA: Diagnosis present

## 2022-01-26 DIAGNOSIS — R739 Hyperglycemia, unspecified: Secondary | ICD-10-CM | POA: Diagnosis not present

## 2022-01-26 DIAGNOSIS — Z789 Other specified health status: Secondary | ICD-10-CM | POA: Diagnosis not present

## 2022-01-26 DIAGNOSIS — K7460 Unspecified cirrhosis of liver: Secondary | ICD-10-CM | POA: Diagnosis present

## 2022-01-26 DIAGNOSIS — R1110 Vomiting, unspecified: Secondary | ICD-10-CM | POA: Diagnosis not present

## 2022-01-26 DIAGNOSIS — F419 Anxiety disorder, unspecified: Secondary | ICD-10-CM | POA: Diagnosis not present

## 2022-01-26 DIAGNOSIS — J9601 Acute respiratory failure with hypoxia: Secondary | ICD-10-CM | POA: Diagnosis present

## 2022-01-26 DIAGNOSIS — M4856XA Collapsed vertebra, not elsewhere classified, lumbar region, initial encounter for fracture: Secondary | ICD-10-CM | POA: Diagnosis present

## 2022-01-26 DIAGNOSIS — I10 Essential (primary) hypertension: Secondary | ICD-10-CM | POA: Diagnosis not present

## 2022-01-26 DIAGNOSIS — K922 Gastrointestinal hemorrhage, unspecified: Secondary | ICD-10-CM | POA: Diagnosis not present

## 2022-01-26 DIAGNOSIS — Z8249 Family history of ischemic heart disease and other diseases of the circulatory system: Secondary | ICD-10-CM

## 2022-01-26 DIAGNOSIS — J690 Pneumonitis due to inhalation of food and vomit: Secondary | ICD-10-CM | POA: Diagnosis present

## 2022-01-26 DIAGNOSIS — I447 Left bundle-branch block, unspecified: Secondary | ICD-10-CM | POA: Diagnosis present

## 2022-01-26 DIAGNOSIS — M79605 Pain in left leg: Secondary | ICD-10-CM | POA: Diagnosis not present

## 2022-01-26 DIAGNOSIS — A419 Sepsis, unspecified organism: Secondary | ICD-10-CM | POA: Diagnosis not present

## 2022-01-26 DIAGNOSIS — R112 Nausea with vomiting, unspecified: Secondary | ICD-10-CM | POA: Diagnosis not present

## 2022-01-26 DIAGNOSIS — R1013 Epigastric pain: Secondary | ICD-10-CM | POA: Diagnosis not present

## 2022-01-26 DIAGNOSIS — M79604 Pain in right leg: Secondary | ICD-10-CM | POA: Diagnosis not present

## 2022-01-26 DIAGNOSIS — R109 Unspecified abdominal pain: Secondary | ICD-10-CM | POA: Diagnosis present

## 2022-01-26 DIAGNOSIS — Z299 Encounter for prophylactic measures, unspecified: Secondary | ICD-10-CM | POA: Diagnosis not present

## 2022-01-26 DIAGNOSIS — K589 Irritable bowel syndrome without diarrhea: Secondary | ICD-10-CM | POA: Diagnosis present

## 2022-01-26 DIAGNOSIS — Z79899 Other long term (current) drug therapy: Secondary | ICD-10-CM

## 2022-01-26 DIAGNOSIS — I700 Atherosclerosis of aorta: Secondary | ICD-10-CM | POA: Diagnosis not present

## 2022-01-26 DIAGNOSIS — Z7982 Long term (current) use of aspirin: Secondary | ICD-10-CM | POA: Diagnosis not present

## 2022-01-26 LAB — CBC WITH DIFFERENTIAL/PLATELET
Abs Immature Granulocytes: 0.11 10*3/uL — ABNORMAL HIGH (ref 0.00–0.07)
Basophils Absolute: 0.1 10*3/uL (ref 0.0–0.1)
Basophils Relative: 0 %
Eosinophils Absolute: 0.1 10*3/uL (ref 0.0–0.5)
Eosinophils Relative: 0 %
HCT: 41.1 % (ref 36.0–46.0)
Hemoglobin: 14.5 g/dL (ref 12.0–15.0)
Immature Granulocytes: 1 %
Lymphocytes Relative: 5 %
Lymphs Abs: 1.1 10*3/uL (ref 0.7–4.0)
MCH: 30.4 pg (ref 26.0–34.0)
MCHC: 35.3 g/dL (ref 30.0–36.0)
MCV: 86.2 fL (ref 80.0–100.0)
Monocytes Absolute: 0.7 10*3/uL (ref 0.1–1.0)
Monocytes Relative: 3 %
Neutro Abs: 20.8 10*3/uL — ABNORMAL HIGH (ref 1.7–7.7)
Neutrophils Relative %: 91 %
Platelets: 216 10*3/uL (ref 150–400)
RBC: 4.77 MIL/uL (ref 3.87–5.11)
RDW: 13.8 % (ref 11.5–15.5)
WBC: 22.9 10*3/uL — ABNORMAL HIGH (ref 4.0–10.5)
nRBC: 0 % (ref 0.0–0.2)

## 2022-01-26 LAB — COMPREHENSIVE METABOLIC PANEL
ALT: 40 U/L (ref 0–44)
AST: 40 U/L (ref 15–41)
Albumin: 3.7 g/dL (ref 3.5–5.0)
Alkaline Phosphatase: 81 U/L (ref 38–126)
Anion gap: 15 (ref 5–15)
BUN: 27 mg/dL — ABNORMAL HIGH (ref 8–23)
CO2: 24 mmol/L (ref 22–32)
Calcium: 9.2 mg/dL (ref 8.9–10.3)
Chloride: 101 mmol/L (ref 98–111)
Creatinine, Ser: 1.1 mg/dL — ABNORMAL HIGH (ref 0.44–1.00)
GFR, Estimated: 48 mL/min — ABNORMAL LOW (ref >60)
Glucose, Bld: 145 mg/dL — ABNORMAL HIGH (ref 70–99)
Potassium: 2.8 mmol/L — ABNORMAL LOW (ref 3.5–5.1)
Sodium: 140 mmol/L (ref 135–145)
Total Bilirubin: 0.6 mg/dL (ref 0.3–1.2)
Total Protein: 7.2 g/dL (ref 6.5–8.1)

## 2022-01-26 LAB — POC OCCULT BLOOD, ED: Fecal Occult Bld: POSITIVE — AB

## 2022-01-26 LAB — LIPASE, BLOOD: Lipase: 31 U/L (ref 11–51)

## 2022-01-26 MED ORDER — ONDANSETRON HCL 4 MG/2ML IJ SOLN
4.0000 mg | Freq: Once | INTRAMUSCULAR | Status: AC
  Administered 2022-01-26: 4 mg via INTRAVENOUS
  Filled 2022-01-26: qty 2

## 2022-01-26 MED ORDER — POTASSIUM CHLORIDE 10 MEQ/100ML IV SOLN
10.0000 meq | INTRAVENOUS | Status: AC
  Administered 2022-01-27 (×2): 10 meq via INTRAVENOUS
  Filled 2022-01-26 (×2): qty 100

## 2022-01-26 MED ORDER — LACTATED RINGERS IV BOLUS
1000.0000 mL | Freq: Once | INTRAVENOUS | Status: AC
  Administered 2022-01-26: 1000 mL via INTRAVENOUS

## 2022-01-26 NOTE — ED Provider Notes (Addendum)
?[facility] ?Provider Note ? ?CSN: [id number] ?Arrival date & time: 01/26/22 2245 ? ?Chief Complaint(s) ?Emesis ? ?HPI ?Heather [name] is a 88 y.o. female with PMH NASH cirrhosis, anxiety, HTN who presents to the emergency department from her facility with complaints of vomiting.  There apparently have been multiple ER presentations from this facility associated with eating chili beans.  The patient did eat these chili beans this afternoon and had immediate onset vomiting but no diarrhea.  She currently endorses a burning chest pain that started after vomiting today but no bloody vomiting.  Endorses nausea but denies dysuria, shortness of breath, fever or other systemic symptoms. ? ? ?Emesis ? ?Past Medical History ?Past Medical History:  ?Diagnosis Date  ? Anxiety   ? GERD (gastroesophageal reflux disease)   ? Hx of shoulder surgery   ? Left Shoulder - pitched nerve.  ? Other and unspecified hyperlipidemia   ? Unspecified essential hypertension   ? ?Patient Active Problem List  ? Diagnosis Date Noted  ? Liver cirrhosis secondary to NASH ([place]) 11/03/2021  ? IBS (irritable bowel syndrome) 08/20/2020  ? Nausea with vomiting 06/18/2020  ? Anorexia 11/11/2019  ? Abdominal pain 08/12/2019  ? Rash and nonspecific skin eruption 08/12/2019  ? Elevated LFTs 08/12/2019  ? Carpal tunnel syndrome, right upper limb 08/31/2018  ? Essential hypertension 06/09/2018  ? Anxiety 06/09/2018  ? GERD (gastroesophageal reflux disease) 06/09/2018  ? Palpitations 04/22/2012  ? Chest pain 04/22/2012  ? Coronary artery disease excluded 04/22/2012  ? Insomnia 04/22/2012  ? ?Home Medication(s) ?Prior to Admission medications   ?Medication Sig Start Date End Date Taking? Authorizing Provider  ?albuterol (VENTOLIN HFA) 108 (90 Base) MCG/ACT inhaler Inhale 2 puffs into the lungs every 4 (four) hours as needed for wheezing or shortness of breath.  03/15/19   [provider]  ?ALPRAZolam Duanne Moron) 1 MG tablet Take 1 mg  by mouth 2 (two) times daily.  03/16/12   [provider]  ?amLODipine (NORVASC) 10 MG tablet Take 10 mg by mouth every morning.  07/14/17   [provider]  ?Ascorbic Acid (VITAMIN C WITH ROSE HIPS) 500 MG tablet Take 500 mg by mouth daily.    [provider]  ?aspirin 81 MG tablet Take 2 tablets (162 mg total) by mouth in the morning and at bedtime. 04/10/20   Rogene Houston, MD  ?Calcium-Magnesium-Zinc (CAL-MAG-ZINC PO) Take 1 tablet by mouth daily.    [provider]  ?chlorthalidone (HYGROTON) 25 MG tablet Take 25 mg by mouth daily. 11/23/16   [provider]  ?dicyclomine (BENTYL) 10 MG capsule TAKE ONE CAPSULE BY MOUTH FOUR TIMES DAILY BEFORE MEALS and AT BEDTIME 12/21/21   Harvel Quale, MD  ?furosemide (LASIX) 40 MG tablet Take 40 mg by mouth daily.    [provider]  ?linaclotide Rolan Lipa) 290 MCG CAPS capsule Take 1 capsule (290 mcg total) by mouth daily before breakfast. 11/03/21   Montez Morita, Quillian Quince, MD  ?meloxicam (MOBIC) 7.5 MG tablet Take 7.5 mg by mouth daily.    [provider]  ?metoprolol tartrate (LOPRESSOR) 25 MG tablet Take 25 mg by mouth 2 (two) times daily. 07/10/18   [provider]  ?Naphazoline-Pheniramine (OPCON-A) 0.027-0.315 % SOLN Place 1 drop into both eyes 2 (two) times daily as needed (dry eyes).    [provider]  ?nitroGLYCERIN (NITROSTAT) 0.4 MG SL tablet Place 0.4 mg under the tongue every 5 (five) minutes as needed for chest pain.  [provider]  ?omeprazole (PRILOSEC) 40 MG capsule Take 40 mg by mouth daily.    [provider]  ?Polyethylene Glycol 3350 (MIRALAX PO) Take by mouth.    [provider]  ?potassium chloride (KLOR-CON) 20 MEQ packet Take 20 mEq by mouth daily.    [provider]  ?VITAMIN D PO Take 1 capsule by mouth daily.     [provider]  ?                                                                                                                                   ?Past Surgical History ?Past Surgical History:  ?Procedure Laterality Date  ? BIOPSY  04/09/2020  ? Procedure: BIOPSY;  Surgeon: Rogene Houston, MD;  [facility];  Service: Endoscopy;;  ? CERVICAL DISC SURGERY    ? [place]  ? colonscopy  08/22/2012  ? moderate diverticulosos in sigmoid and two tubular adenomas  ? ESOPHAGOGASTRODUODENOSCOPY N/A 04/09/2020  ? Rehman:Normal hypopharynx.normal esophagus, z line irreg, 38 cm from incisors, erythematous mucosa in antrum and preplyoric region, gastric antral mucosa with non specific reactive gastropathy, normal duodenal bul and second portion of duodenum  ? History of shoulder surgery Left   ? pitched nerve.  ? ?Family History ?Family History  ?Problem Relation Age of Onset  ? Hypertension Other   ? Heart attack Sister   ? ? ?Social History ?Social History  ? ?Tobacco Use  ? Smoking status: Never  ? Smokeless tobacco: Never  ?Vaping Use  ? Vaping Use: Never used  ?Substance Use Topics  ? Alcohol use: No  ?  Alcohol/week: 0.0 standard drinks  ? Drug use: No  ? ?Allergies ?Patient has no known allergies. ? ?Review of Systems ?Review of Systems  ?Cardiovascular:  Positive for chest pain.  ?Gastrointestinal:  Positive for nausea and vomiting.  ? ?Physical Exam ?Vital Signs  ?I have reviewed the triage vital signs ?BP (!) 127/50   Pulse 100   Resp 19   Ht '5\' 7"'$  (1.702 m)   Wt 80 kg   SpO2 91%   BMI 27.62 kg/m?  ? ?Physical Exam ?Vitals and nursing note reviewed.  ?Constitutional:   ?   General: She is not in acute distress. ?   Appearance: She is well-developed.  ?HENT:  ?   Head: Normocephalic and atraumatic.  ?Eyes:  ?   Conjunctiva/sclera: Conjunctivae normal.  ?Cardiovascular:  ?   Rate and Rhythm: Normal rate and regular rhythm.  ?   Heart sounds: No murmur heard. ?Pulmonary:  ?   Effort: Pulmonary effort is normal. No respiratory distress.  ?   Breath sounds: Normal breath sounds.   ?Abdominal:  ?   Palpations: Abdomen is soft.  ?   Tenderness: There is abdominal tenderness (epigastric).  ?Musculoskeletal:     ?   General: No swelling.  ?   Cervical back:  Neck supple.  ?Skin: ?   General: Skin is warm and dry.  ?   Capillary Refill: Capillary refill takes less than 2 seconds.  ?Neurological:  ?   Mental Status: She is alert.  ?Psychiatric:     ?   Mood and Affect: Mood normal.  ? ? ?ED Results and Treatments ?Labs ?(all labs ordered are listed, but only abnormal results are displayed) ?Labs Reviewed  ?CBC WITH DIFFERENTIAL/PLATELET - Abnormal; Notable for the following components:  ?    Result Value  ? WBC 22.9 (*)   ? Neutro Abs 20.8 (*)   ? Abs Immature Granulocytes 0.11 (*)   ? All other components within normal limits  ?COMPREHENSIVE METABOLIC PANEL - Abnormal; Notable for the following components:  ? Potassium 2.8 (*)   ? Glucose, Bld 145 (*)   ? BUN 27 (*)   ? Creatinine, Ser 1.10 (*)   ? GFR, Estimated 48 (*)   ? All other components within normal limits  ?LIPASE, BLOOD  ?URINALYSIS, ROUTINE W REFLEX MICROSCOPIC  ?                                                                                                                       ? ?Radiology ?No results found. ? ?Pertinent labs & imaging results that were available during my care of the patient were reviewed by me and considered in my medical decision making (see MDM for details). ? ?Medications Ordered in ED ?Medications  ?ondansetron (ZOFRAN) injection 4 mg (4 mg Intravenous Given 01/26/22 2320)  ?lactated ringers bolus 1,000 mL (1,000 mLs Intravenous New Bag/Given 01/26/22 2320)  ?                                                               ?                                                                    ?Procedures ?Marland KitchenCritical Care ?Performed by: Teressa Lower, MD ?Authorized by: Teressa Lower, MD  ? ?Critical care provider statement:  ?  Critical care time (minutes):  30 ?  Critical care was necessary to treat or prevent  imminent or life-threatening deterioration of the following conditions:  Respiratory failure ?  Critical care was time spent personally by me on the following activities:  Development of treatment plan with patien

## 2022-01-26 NOTE — ED Triage Notes (Signed)
Pt brought in [facility] from [facility]. Pt c/o nausea and vomiting for the past few hours after eating chilli beans.  ?

## 2022-01-27 ENCOUNTER — Emergency Department (HOSPITAL_COMMUNITY): Payer: PPO

## 2022-01-27 ENCOUNTER — Other Ambulatory Visit: Payer: Self-pay

## 2022-01-27 DIAGNOSIS — K581 Irritable bowel syndrome with constipation: Secondary | ICD-10-CM | POA: Diagnosis present

## 2022-01-27 DIAGNOSIS — I700 Atherosclerosis of aorta: Secondary | ICD-10-CM | POA: Diagnosis not present

## 2022-01-27 DIAGNOSIS — K922 Gastrointestinal hemorrhage, unspecified: Secondary | ICD-10-CM | POA: Diagnosis present

## 2022-01-27 DIAGNOSIS — I447 Left bundle-branch block, unspecified: Secondary | ICD-10-CM | POA: Diagnosis present

## 2022-01-27 DIAGNOSIS — R112 Nausea with vomiting, unspecified: Secondary | ICD-10-CM | POA: Diagnosis not present

## 2022-01-27 DIAGNOSIS — R7989 Other specified abnormal findings of blood chemistry: Secondary | ICD-10-CM

## 2022-01-27 DIAGNOSIS — R739 Hyperglycemia, unspecified: Secondary | ICD-10-CM | POA: Diagnosis present

## 2022-01-27 DIAGNOSIS — E876 Hypokalemia: Secondary | ICD-10-CM | POA: Diagnosis present

## 2022-01-27 DIAGNOSIS — J690 Pneumonitis due to inhalation of food and vomit: Secondary | ICD-10-CM | POA: Diagnosis present

## 2022-01-27 DIAGNOSIS — K7581 Nonalcoholic steatohepatitis (NASH): Secondary | ICD-10-CM | POA: Diagnosis present

## 2022-01-27 DIAGNOSIS — R778 Other specified abnormalities of plasma proteins: Secondary | ICD-10-CM

## 2022-01-27 DIAGNOSIS — K219 Gastro-esophageal reflux disease without esophagitis: Secondary | ICD-10-CM | POA: Diagnosis not present

## 2022-01-27 DIAGNOSIS — I10 Essential (primary) hypertension: Secondary | ICD-10-CM

## 2022-01-27 DIAGNOSIS — J189 Pneumonia, unspecified organism: Secondary | ICD-10-CM

## 2022-01-27 DIAGNOSIS — R1110 Vomiting, unspecified: Secondary | ICD-10-CM | POA: Diagnosis not present

## 2022-01-27 DIAGNOSIS — F419 Anxiety disorder, unspecified: Secondary | ICD-10-CM | POA: Diagnosis present

## 2022-01-27 DIAGNOSIS — A419 Sepsis, unspecified organism: Secondary | ICD-10-CM | POA: Diagnosis present

## 2022-01-27 DIAGNOSIS — E785 Hyperlipidemia, unspecified: Secondary | ICD-10-CM | POA: Diagnosis present

## 2022-01-27 DIAGNOSIS — K449 Diaphragmatic hernia without obstruction or gangrene: Secondary | ICD-10-CM | POA: Diagnosis present

## 2022-01-27 DIAGNOSIS — R1013 Epigastric pain: Secondary | ICD-10-CM

## 2022-01-27 DIAGNOSIS — Z8249 Family history of ischemic heart disease and other diseases of the circulatory system: Secondary | ICD-10-CM | POA: Diagnosis not present

## 2022-01-27 DIAGNOSIS — Z7982 Long term (current) use of aspirin: Secondary | ICD-10-CM | POA: Diagnosis not present

## 2022-01-27 DIAGNOSIS — J9601 Acute respiratory failure with hypoxia: Secondary | ICD-10-CM | POA: Diagnosis present

## 2022-01-27 DIAGNOSIS — R079 Chest pain, unspecified: Secondary | ICD-10-CM | POA: Diagnosis not present

## 2022-01-27 DIAGNOSIS — M4856XA Collapsed vertebra, not elsewhere classified, lumbar region, initial encounter for fracture: Secondary | ICD-10-CM | POA: Diagnosis present

## 2022-01-27 DIAGNOSIS — Z79899 Other long term (current) drug therapy: Secondary | ICD-10-CM | POA: Diagnosis not present

## 2022-01-27 DIAGNOSIS — K7460 Unspecified cirrhosis of liver: Secondary | ICD-10-CM | POA: Diagnosis not present

## 2022-01-27 DIAGNOSIS — R109 Unspecified abdominal pain: Secondary | ICD-10-CM | POA: Diagnosis not present

## 2022-01-27 LAB — TROPONIN I (HIGH SENSITIVITY)
Troponin I (High Sensitivity): 27 ng/L — ABNORMAL HIGH (ref <18)
Troponin I (High Sensitivity): 103 ng/L (ref <18)
Troponin I (High Sensitivity): 90 ng/L — ABNORMAL HIGH (ref <18)
Troponin I (High Sensitivity): 93 ng/L — ABNORMAL HIGH (ref <18)

## 2022-01-27 LAB — URINALYSIS, ROUTINE W REFLEX MICROSCOPIC
Bilirubin Urine: NEGATIVE
Glucose, UA: NEGATIVE mg/dL
Hgb urine dipstick: NEGATIVE
Ketones, ur: NEGATIVE mg/dL
Leukocytes,Ua: NEGATIVE
Nitrite: NEGATIVE
Protein, ur: NEGATIVE mg/dL
Specific Gravity, Urine: 1.021 (ref 1.005–1.030)
pH: 5 (ref 5.0–8.0)

## 2022-01-27 LAB — PROCALCITONIN: Procalcitonin: 4.49 ng/mL

## 2022-01-27 LAB — STREP PNEUMONIAE URINARY ANTIGEN: Strep Pneumo Urinary Antigen: NEGATIVE

## 2022-01-27 MED ORDER — ONDANSETRON HCL 4 MG/2ML IJ SOLN: 4.0000 mg | Freq: Four times a day (QID) | INTRAMUSCULAR | Status: DC | PRN

## 2022-01-27 MED ORDER — SODIUM CHLORIDE 0.9 % IV SOLN
1.0000 g | INTRAVENOUS | Status: DC
  Administered 2022-01-27: 1 g via INTRAVENOUS
  Filled 2022-01-27: qty 10

## 2022-01-27 MED ORDER — POTASSIUM CHLORIDE 10 MEQ/100ML IV SOLN
10.0000 meq | Freq: Once | INTRAVENOUS | Status: AC
  Administered 2022-01-27: 10 meq via INTRAVENOUS
  Filled 2022-01-27: qty 100

## 2022-01-27 MED ORDER — SODIUM CHLORIDE 0.9 % IV SOLN
500.0000 mg | INTRAVENOUS | Status: DC
  Administered 2022-01-27: 500 mg via INTRAVENOUS
  Filled 2022-01-27: qty 5

## 2022-01-27 MED ORDER — PANTOPRAZOLE SODIUM 40 MG IV SOLR
40.0000 mg | INTRAVENOUS | Status: DC
  Administered 2022-01-27 – 2022-01-28 (×2): 40 mg via INTRAVENOUS
  Filled 2022-01-27 (×2): qty 10

## 2022-01-27 MED ORDER — AZITHROMYCIN 250 MG PO TABS
500.0000 mg | ORAL_TABLET | Freq: Every day | ORAL | Status: DC
  Administered 2022-01-28: 500 mg via ORAL
  Filled 2022-01-27: qty 2

## 2022-01-27 MED ORDER — ACETAMINOPHEN 325 MG PO TABS
650.0000 mg | ORAL_TABLET | Freq: Four times a day (QID) | ORAL | Status: DC | PRN
  Administered 2022-01-27 – 2022-01-28 (×3): 650 mg via ORAL
  Filled 2022-01-27 (×3): qty 2

## 2022-01-27 MED ORDER — AMPICILLIN-SULBACTAM SODIUM 3 (2-1) G IJ SOLR
3.0000 g | Freq: Three times a day (TID) | INTRAMUSCULAR | Status: DC
  Administered 2022-01-27 – 2022-01-28 (×4): 3 g via INTRAVENOUS
  Filled 2022-01-27 (×8): qty 8

## 2022-01-27 MED ORDER — DICYCLOMINE HCL 10 MG PO CAPS
10.0000 mg | ORAL_CAPSULE | Freq: Three times a day (TID) | ORAL | Status: DC
  Administered 2022-01-27 – 2022-01-28 (×4): 10 mg via ORAL
  Filled 2022-01-27 (×4): qty 1

## 2022-01-27 MED ORDER — DM-GUAIFENESIN ER 30-600 MG PO TB12
1.0000 | ORAL_TABLET | Freq: Two times a day (BID) | ORAL | Status: DC
  Administered 2022-01-27 – 2022-01-28 (×3): 1 via ORAL
  Filled 2022-01-27 (×4): qty 1

## 2022-01-27 MED ORDER — LINACLOTIDE 145 MCG PO CAPS
290.0000 ug | ORAL_CAPSULE | Freq: Every day | ORAL | Status: DC
  Administered 2022-01-27: 290 ug via ORAL
  Filled 2022-01-27: qty 2

## 2022-01-27 MED ORDER — IOHEXOL 300 MG/ML  SOLN
75.0000 mL | Freq: Once | INTRAMUSCULAR | Status: AC | PRN
  Administered 2022-01-27: 75 mL via INTRAVENOUS

## 2022-01-27 NOTE — Progress Notes (Signed)
Patient seen and evaluated, chart reviewed, please see EMR for updated orders. Please see full H&P dictated by admitting physician Dr Josephine Cables for same date of service.  ? ?Brief Summary:- ?88 y.o. female with medical history significant for essential hypertension, compression fracture (L2 lumbar vertebra), hypokalemia, irritable bowel syndrome and GERD admitted on 01/27/2022 with sepsis secondary to aspiration pneumonia ? ? ?A/p ?1)Sepsis secondary to presumed aspiration pneumonia ?-Speech evaluation appreciated recommends regular diet and thin liquid ?-Clinical exam and imaging studies consistent with aspiration pneumonia ?-Strep pneumo antigen negative, Legionella antigen pending ?-Continue Unasyn and azithromycin ?-Bronchodilators and mucolytics/ordered ?-Continue supplemental oxygen ? ?2) abdominal pain with nausea vomiting---- ?Hiatal hernia/GERD-- ?emesis most likely contributed to #1 above ?-CT abdomen and pelvis without acute findings ?-Protonix and as needed Zofran as ? ?3) hypokalemia--- in the setting of emesis, replace and recheck ? ?4)HTN--continue to hold amlodipine given sepsis and concerns for hemodynamic instability ? ?5) elevated troponin --- pattern not consistent with ACS, suspect type II demand ischemia secondary to sepsis/pneumonia with hypoxia ?-EKG sinus rhythm with incomplete left bundle branch block, compared to EKG from May 2020 no significant change incomplete liver branch block with previously present ?--Echo requested ? ?-- Total care time over 46 minutes ?Roxan Hockey, MD ? ?

## 2022-01-27 NOTE — Progress Notes (Signed)
Pharmacy Antibiotic Note ? ?Heather [name] is a 88 y.o. female admitted on 01/26/2022 with pneumonia.  Pharmacy has been consulted for Unasyn dosing. WBC elevated. CrCl ~35-40.  ? ?Plan: ?Unasyn 3g IV q8h ?Trend WBC, temp, renal function  ?F/U infectious work-up ? ? ?Height: '5\' 7"'$  (170.2 cm) ?Weight: 80 kg (176 lb 5.9 oz) ?IBW/kg (Calculated) : 61.6 ? ?No data recorded. ? ?Recent Labs  ?Lab 01/26/22 ?2309  ?WBC 22.9*  ?CREATININE 1.10*  ?  ?Estimated Creatinine Clearance: 38.5 mL/min (A) (by C-G formula based on SCr of 1.1 mg/dL (H)).   ? ?No Known Allergies ? ?Narda Bonds, PharmD, BCPS ?Clinical Pharmacist ?Phone: [phone] ? ? ?

## 2022-01-27 NOTE — TOC Initial Note (Signed)
Transition of Care (TOC) - Initial/Assessment Note  ? ? ?Patient Details  ?Name: Heather [name] ?MRN: [id number] ?Date of Birth: August 02, 1931 ? ?Transition of Care (TOC) CM/SW Contact:    ?Iona Beard, LCSWA ?Phone Number: ?01/27/2022, 12:55 PM ? ?Clinical Narrative:                 ?Pt is resident at [facility]. CSW spoke with facility staff who state that pt came to them from [facility]. Pt was able to walk with walker at [facility] but would call for assistance each time to prevent falling. Pt at ALF has been staying in wheelchair as she does not have a walker at the facility.  ? ?Facility states they do not take admissions over the weekend. CSW updated MD of this information. TOC to follow.  ? ?Expected Discharge Plan: Assisted Living ?Barriers to Discharge: Continued Medical Work up ? ? ?Patient Goals and CMS Choice ?Patient states their goals for this hospitalization and ongoing recovery are:: Return to ALF ?CMS Medicare.gov Compare Post Acute Care list provided to:: Patient ?Choice offered to / list presented to : Patient ? ?Expected Discharge Plan and Services ?Expected Discharge Plan: Assisted Living ?In-house Referral: Clinical Social Work ?  ?Post Acute Care Choice: Resumption of Svcs/PTA Provider ?Living arrangements for the past 2 months: [place] ?                ?  ?  ?  ?  ?  ?  ?  ?  ?  ?  ? ?Prior Living Arrangements/Services ?Living arrangements for the past 2 months: [place] ?Lives with:: Facility Resident ?Patient language and need for interpreter reviewed:: Yes ?Do you feel safe going back to the place where you live?: Yes      ?Need for Family Participation in Patient Care: Yes (Comment) ?Care giver support system in place?: Yes (comment) ?  ?Criminal Activity/Legal Involvement Pertinent to Current Situation/Hospitalization: No - Comment as needed ? ?Activities of Daily Living ?  ?  ? ?Permission Sought/Granted ?  ?  ?   ?   ?   ?   ? ?Emotional Assessment ?   ?  ?  ?  ?Alcohol / Substance Use: Not Applicable ?Psych Involvement: No (comment) ? ?Admission diagnosis:  Aspiration pneumonia ([place]) [J69.0] ?Patient Active Problem List  ? Diagnosis Date Noted  ? Aspiration pneumonia ([place]) 01/27/2022  ? Elevated troponin 01/27/2022  ? Sepsis ([place]) 01/27/2022  ? Multifocal pneumonia 01/27/2022  ? Liver cirrhosis secondary to NASH ([place]) 11/03/2021  ? IBS (irritable bowel syndrome) 08/20/2020  ? Nausea with vomiting 06/18/2020  ? Anorexia 11/11/2019  ? Abdominal pain 08/12/2019  ? Rash and nonspecific skin eruption 08/12/2019  ? Elevated LFTs 08/12/2019  ? Carpal tunnel syndrome, right upper limb 08/31/2018  ? Essential hypertension 06/09/2018  ? Anxiety 06/09/2018  ? GERD (gastroesophageal reflux disease) 06/09/2018  ? Palpitations 04/22/2012  ? Chest pain 04/22/2012  ? Coronary artery disease excluded 04/22/2012  ? Insomnia 04/22/2012  ? ?PCP:  Glenda Chroman, MD ?Pharmacy:   ?[facility] ?[address] ?[place] [address] ?Phone: [phone] Fax: [phone] ? ? ? ? ?Social Determinants of Health (SDOH) Interventions ?  ? ?Readmission Risk Interventions ?   ? View : No data to display.  ?  ?  ?  ? ? ? ?

## 2022-01-27 NOTE — H&P (Signed)
?History and Physical  ? ? ?PatientMylinh [name] JYN:[id number] DOB: [birth year] ?DOA: 01/26/2022 ?DOS: the patient was seen and examined on 01/27/2022 ?PCP: Glenda Chroman, MD  ?Patient coming from: SNF ? ?Chief Complaint:  ?Chief Complaint  ?Patient presents with  ? Emesis  ? ?HPI: Heather [name] is a 88 y.o. female with medical history significant for essential hypertension, compression fracture (L2 lumbar vertebra), hypokalemia, irritable bowel syndrome and GERD who presents to the emergency department EMS from nursing home facility due to complaint of nonbloody vomiting after eating chili beans this afternoon.  Vomiting was associated with nausea, she complained of burning sensation in chest, but she denies shortness of breath, fever, chills, diarrhea.  EMS was activated and patient was sent to the ED for further evaluation and management. ? ? ?ED Course:  ?In the emergency department, she was intermittently tachypneic, BP 132/58, patient was hypoxic on room air (86-88% per ED physician), this improved with supplemental oxygen at 2 LPM.  Work-up in the ED showed leukocytosis, hypokalemia, hyperglycemia, FOBT was positive, troponin x 2 - 27 > 103.  Patient had a bowel movement while in the ED which was black in nature, stool occult test done was positive. ?Chest x-ray showed infiltrates in the right lower lung suggesting pneumonia ?CT abdomen and pelvis with contrast showed right perihilar and basilar infiltration possibly representing ?multifocal pneumonia or aspiration. No evidence of bowel obstruction or inflammation ?Patient was treated with IV ceftriaxone and azithromycin.  IV hydration was provided, potassium was replenished.  Hospitalist was asked to admit patient for further evaluation and management. ? ?Review of Systems: ?Review of systems as noted in the HPI. All other systems reviewed and are negative. ? ? ?Past Medical History:  ?Diagnosis Date  ? Anxiety   ? GERD (gastroesophageal reflux  disease)   ? Hx of shoulder surgery   ? Left Shoulder - pitched nerve.  ? Other and unspecified hyperlipidemia   ? Unspecified essential hypertension   ? ?Past Surgical History:  ?Procedure Laterality Date  ? BIOPSY  04/09/2020  ? Procedure: BIOPSY;  Surgeon: Rogene Houston, MD;  [facility];  Service: Endoscopy;;  ? CERVICAL DISC SURGERY    ? [place]  ? colonscopy  08/22/2012  ? moderate diverticulosos in sigmoid and two tubular adenomas  ? ESOPHAGOGASTRODUODENOSCOPY N/A 04/09/2020  ? Rehman:Normal hypopharynx.normal esophagus, z line irreg, 38 cm from incisors, erythematous mucosa in antrum and preplyoric region, gastric antral mucosa with non specific reactive gastropathy, normal duodenal bul and second portion of duodenum  ? History of shoulder surgery Left   ? pitched nerve.  ? ? ?Social History:  reports that she has never smoked. She has never used smokeless tobacco. She reports that she does not drink alcohol and does not use drugs. ? ? ?No Known Allergies ? ?Family History  ?Problem Relation Age of Onset  ? Hypertension Other   ? Heart attack Sister   ?  ? ?Prior to Admission medications   ?Medication Sig Start Date End Date Taking? Authorizing Provider  ?albuterol (VENTOLIN HFA) 108 (90 Base) MCG/ACT inhaler Inhale 2 puffs into the lungs every 4 (four) hours as needed for wheezing or shortness of breath.  03/15/19   [provider]  ?ALPRAZolam Duanne Moron) 1 MG tablet Take 1 mg by mouth 2 (two) times daily.  03/16/12   [provider]  ?amLODipine (NORVASC) 10 MG tablet Take 10 mg by mouth every morning.  07/14/17   [provider]  ?Ascorbic Acid (VITAMIN C WITH ROSE HIPS) 500 MG tablet Take 500 mg by mouth daily.    [provider]  ?aspirin 81 MG tablet Take 2 tablets (162 mg total) by mouth in the morning and at bedtime. 04/10/20   Rogene Houston, MD  ?Calcium-Magnesium-Zinc (CAL-MAG-ZINC PO) Take 1 tablet by mouth daily.    [provider]  ?chlorthalidone (HYGROTON) 25 MG tablet Take 25 mg by mouth daily. 11/23/16   [provider]  ?dicyclomine (BENTYL) 10 MG capsule TAKE ONE CAPSULE BY MOUTH FOUR TIMES DAILY BEFORE MEALS and AT BEDTIME 12/21/21   Harvel Quale, MD  ?furosemide (LASIX) 40 MG tablet Take 40 mg by mouth daily.    [provider]  ?linaclotide Rolan Lipa) 290 MCG CAPS capsule Take 1 capsule (290 mcg total) by mouth daily before breakfast. 11/03/21   Montez Morita, Quillian Quince, MD  ?meloxicam (MOBIC) 7.5 MG tablet Take 7.5 mg by mouth daily.    [provider]  ?metoprolol tartrate (LOPRESSOR) 25 MG tablet Take 25 mg by mouth 2 (two) times daily. 07/10/18   [provider]  ?Naphazoline-Pheniramine (OPCON-A) 0.027-0.315 % SOLN Place 1 drop into both eyes 2 (two) times daily as needed (dry eyes).    [provider]  ?nitroGLYCERIN (NITROSTAT) 0.4 MG SL tablet Place 0.4 mg under the tongue every 5 (five) minutes as needed for chest pain.    [provider]  ?omeprazole (PRILOSEC) 40 MG capsule Take 40 mg by mouth daily.    [provider]  ?Polyethylene Glycol 3350 (MIRALAX PO) Take by mouth.    [provider]  ?potassium chloride (KLOR-CON) 20 MEQ packet Take 20 mEq by mouth daily.    [provider]  ?VITAMIN D PO Take 1 capsule by mouth daily.     [provider]  ? ? ?Physical Exam: ?BP (!) 121/48   Pulse 75   Resp (!) 23   Ht '5\' 7"'$  (1.702 m)   Wt 80 kg   SpO2 95%   BMI 27.62 kg/m?  ? ?General: 88 y.o. year-old female well developed well nourished in no acute distress.  Somnolent, but easily arousable, though quickly goes back to sleep.  ?HEENT: NCAT, EOMI ?Neck: Supple, trachea medial ?Cardiovascular: Regular rate and rhythm with no rubs or gallops.  No thyromegaly or JVD noted.  No lower extremity edema. 2/4 pulses in all 4 extremities. ?Respiratory: Tachypnea clear to auscultation with no wheezes or rales. Good  inspiratory effort. ?Abdomen: Soft, tender to palpation of epigastrium.  Nondistended with normal bowel sounds x4 quadrants. ?Muskuloskeletal: No cyanosis, clubbing or edema noted bilaterally ?Neuro: CN II-XII intact, strength 5/5 x 4, sensation, reflexes intact ?Skin: No ulcerative lesions noted or rashes ?Psychiatry: Mood is appropriate for condition and setting ?   ?   ?   ?Labs on Admission:  ?Basic Metabolic Panel: ?Recent Labs  ?Lab 01/26/22 ?2309  ?NA 140  ?K 2.8*  ?CL 101  ?CO2 24  ?GLUCOSE 145*  ?BUN 27*  ?CREATININE 1.10*  ?CALCIUM 9.2  ? ?Liver Function Tests: ?Recent Labs  ?Lab 01/26/22 ?2309  ?AST 40  ?ALT 40  ?ALKPHOS 81  ?BILITOT 0.6  ?PROT 7.2  ?ALBUMIN 3.7  ? ?Recent Labs  ?Lab 01/26/22 ?2309  ?LIPASE 31  ? ?No results for input(s): AMMONIA in the last 168 hours. ?CBC: ?Recent Labs  ?Lab 01/26/22 ?2309  ?WBC 22.9*  ?NEUTROABS 20.8*  ?HGB 14.5  ?HCT 41.1  ?MCV 86.2  ?  PLT 216  ? ?Cardiac Enzymes: ?No results for input(s): CKTOTAL, CKMB, CKMBINDEX, TROPONINI in the last 168 hours. ? ?BNP (last 3 results) ?No results for input(s): BNP in the last 8760 hours. ? ?ProBNP (last 3 results) ?No results for input(s): PROBNP in the last 8760 hours. ? ?CBG: ?No results for input(s): GLUCAP in the last 168 hours. ? ?Radiological Exams on Admission: ?CT ABDOMEN PELVIS W CONTRAST ? ?Result Date: 01/27/2022 ?CLINICAL DATA:  Epigastric pain, nausea, and vomiting. EXAM: CT ABDOMEN AND PELVIS WITH CONTRAST TECHNIQUE: Multidetector CT imaging of the abdomen and pelvis was performed using the standard protocol following bolus administration of intravenous contrast. RADIATION DOSE REDUCTION: This exam was performed according to the departmental dose-optimization program which includes automated exposure control, adjustment of the mA and/or kV according to patient size and/or use of iterative reconstruction technique. CONTRAST:  67m OMNIPAQUE IOHEXOL 300 MG/ML  SOLN COMPARISON:  12/18/2021 FINDINGS: Lower chest: Right  perihilar and basilar patchy infiltration. This could represent multifocal pneumonia or aspiration, in the setting of vomiting. Small esophageal hiatal hernia. Distal esophageal wall is thickened, possibly due to reflux disease. He

## 2022-01-27 NOTE — ED Notes (Signed)
Bladder Scan:331m ?

## 2022-01-27 NOTE — Evaluation (Signed)
Clinical/Bedside Swallow Evaluation ?Patient Details  ?Name: Heather [name] ?MRN: [id number] ?Date of Birth: [birth year] ? ?Today's Date: 01/27/2022 ?Time: SLP Start Time (ACUTE ONLY): 4650 SLP Stop Time (ACUTE ONLY): 3546 ?SLP Time Calculation (min) (ACUTE ONLY): 24 min ? ?Past Medical History:  ?Past Medical History:  ?Diagnosis Date  ? Anxiety   ? GERD (gastroesophageal reflux disease)   ? Hx of shoulder surgery   ? Left Shoulder - pitched nerve.  ? Other and unspecified hyperlipidemia   ? Unspecified essential hypertension   ? ?Past Surgical History:  ?Past Surgical History:  ?Procedure Laterality Date  ? BIOPSY  04/09/2020  ? Procedure: BIOPSY;  Surgeon: Rogene Houston, MD;  [facility];  Service: Endoscopy;;  ? CERVICAL DISC SURGERY    ? [place]  ? colonscopy  08/22/2012  ? moderate diverticulosos in sigmoid and two tubular adenomas  ? ESOPHAGOGASTRODUODENOSCOPY N/A 04/09/2020  ? Rehman:Normal hypopharynx.normal esophagus, z line irreg, 38 cm from incisors, erythematous mucosa in antrum and preplyoric region, gastric antral mucosa with non specific reactive gastropathy, normal duodenal bul and second portion of duodenum  ? History of shoulder surgery Left   ? pitched nerve.  ? ?HPI:  ?[name] is a 88 y.o. female with medical history significant for essential hypertension, compression fracture (L2 lumbar vertebra), hypokalemia, irritable bowel syndrome and GERD who presents to the emergency department EMS from nursing home facility due to complaint of nonbloody vomiting after eating chili beans this afternoon.  Vomiting was associated with nausea, she complained of burning sensation in chest, but she denies shortness of breath, fever, chills, diarrhea.  EMS was activated and patient was sent to the ED for further evaluation and management. Chest x-ray showed infiltrates in the right lower lung suggesting pneumonia  CT abdomen and pelvis with contrast showed right perihilar  and basilar infiltration possibly representing  multifocal pneumonia or aspiration. BSE requested.  ?  ?Assessment / Plan / Recommendation  ?Clinical Impression ? Clinical swallow evaluation completed at bedside (in the ER) with family present. Pt and family report no previous difficulty swallowing, but report that Pt vomited last night several times. Oral motor examination is WNL, Pt wears U/L dentures. Pt consumed ice chips, thin water via cup/straw, puree, and regular textures and exhibited no overt signs or symptoms of aspiration and no reports of globus. Recommend regular textures and thin liquids with standard aspiration and reflux precautions. No further SLP services indicated at this time. Above to Pt, family, and RN. ?SLP Visit Diagnosis: Dysphagia, unspecified (R13.10) ?   ?Aspiration Risk ? No limitations  ?  ?Diet Recommendation Regular;Thin liquid  ? ?Liquid Administration via: Cup;Straw ?Medication Administration: Whole meds with liquid ?Supervision: Patient able to self feed ?Postural Changes: Seated upright at 90 degrees;Remain upright for at least 30 minutes after po intake  ?  ?Other  Recommendations Oral Care Recommendations: Oral care BID ?Other Recommendations: Clarify dietary restrictions   ? ?Recommendations for follow up therapy are one component of a multi-disciplinary discharge planning process, led by the attending physician.  Recommendations may be updated based on patient status, additional functional criteria and insurance authorization. ? ?Follow up Recommendations No SLP follow up  ? ? ?  ?Assistance Recommended at Discharge Set up Supervision/Assistance  ?Functional Status Assessment Patient has not had a recent decline in their functional status  ?Frequency and Duration    ?  ?  ?   ? ?Prognosis    ? ?  ? ?Swallow Study   ?  General Date of Onset: 01/26/22 ?HPI: [name] is a 88 y.o. female with medical history significant for essential hypertension, compression fracture (L2  lumbar vertebra), hypokalemia, irritable bowel syndrome and GERD who presents to the emergency department EMS from nursing home facility due to complaint of nonbloody vomiting after eating chili beans this afternoon.  Vomiting was associated with nausea, she complained of burning sensation in chest, but she denies shortness of breath, fever, chills, diarrhea.  EMS was activated and patient was sent to the ED for further evaluation and management. Chest x-ray showed infiltrates in the right lower lung suggesting pneumonia  CT abdomen and pelvis with contrast showed right perihilar and basilar infiltration possibly representing  multifocal pneumonia or aspiration. BSE requested. ?Type of Study: Bedside Swallow Evaluation ?Diet Prior to this Study: NPO ?Temperature Spikes Noted: No ?Respiratory Status: Room air ?History of Recent Intubation: No ?Behavior/Cognition: Alert;Cooperative;Pleasant mood ?Oral Cavity Assessment: Within Functional Limits ?Oral Care Completed by SLP: No ?Oral Cavity - Dentition: Dentures, top;Dentures, bottom ?Vision: Functional for self-feeding ?Self-Feeding Abilities: Able to feed self ?Patient Positioning: Upright in bed ?Baseline Vocal Quality: Normal ?Volitional Cough: Strong ?Volitional Swallow: Able to elicit  ?  ?Oral/Motor/Sensory Function Overall Oral Motor/Sensory Function: Within functional limits   ?Ice Chips Ice chips: Within functional limits ?Presentation: Spoon   ?Thin Liquid Thin Liquid: Within functional limits ?Presentation: Cup;Self Fed;Straw  ?  ?Nectar Thick Nectar Thick Liquid: Not tested   ?Honey Thick Honey Thick Liquid: Not tested   ?Puree Puree: Within functional limits ?Presentation: Spoon   ?Solid ? ? ?  Solid: Within functional limits ?Presentation: Spoon  ? ?  ?Thank you, ? ?Genene Churn, [place] ?[phone] ? ?Thomasina Housley ?01/27/2022,4:02 PM ? ? ? ?

## 2022-01-27 NOTE — Progress Notes (Signed)
Report from ED  ?

## 2022-01-27 NOTE — Progress Notes (Signed)
Attempted report from ED ?

## 2022-01-28 DIAGNOSIS — K7581 Nonalcoholic steatohepatitis (NASH): Secondary | ICD-10-CM

## 2022-01-28 DIAGNOSIS — J690 Pneumonitis due to inhalation of food and vomit: Secondary | ICD-10-CM | POA: Diagnosis not present

## 2022-01-28 DIAGNOSIS — K219 Gastro-esophageal reflux disease without esophagitis: Secondary | ICD-10-CM | POA: Diagnosis not present

## 2022-01-28 DIAGNOSIS — K581 Irritable bowel syndrome with constipation: Secondary | ICD-10-CM | POA: Diagnosis not present

## 2022-01-28 DIAGNOSIS — K7460 Unspecified cirrhosis of liver: Secondary | ICD-10-CM

## 2022-01-28 LAB — COMPREHENSIVE METABOLIC PANEL
ALT: 27 U/L (ref 0–44)
AST: 31 U/L (ref 15–41)
Albumin: 2.9 g/dL — ABNORMAL LOW (ref 3.5–5.0)
Alkaline Phosphatase: 56 U/L (ref 38–126)
Anion gap: 9 (ref 5–15)
BUN: 15 mg/dL (ref 8–23)
CO2: 28 mmol/L (ref 22–32)
Calcium: 8.8 mg/dL — ABNORMAL LOW (ref 8.9–10.3)
Chloride: 104 mmol/L (ref 98–111)
Creatinine, Ser: 0.7 mg/dL (ref 0.44–1.00)
GFR, Estimated: >60 mL/min (ref >60)
Glucose, Bld: 100 mg/dL — ABNORMAL HIGH (ref 70–99)
Potassium: 2.5 mmol/L — CL (ref 3.5–5.1)
Sodium: 141 mmol/L (ref 135–145)
Total Bilirubin: 0.7 mg/dL (ref 0.3–1.2)
Total Protein: 5.8 g/dL — ABNORMAL LOW (ref 6.5–8.1)

## 2022-01-28 LAB — CBC
HCT: 36.7 % (ref 36.0–46.0)
Hemoglobin: 12.2 g/dL (ref 12.0–15.0)
MCH: 29.8 pg (ref 26.0–34.0)
MCHC: 33.2 g/dL (ref 30.0–36.0)
MCV: 89.5 fL (ref 80.0–100.0)
Platelets: 152 10*3/uL (ref 150–400)
RBC: 4.1 MIL/uL (ref 3.87–5.11)
RDW: 14.4 % (ref 11.5–15.5)
WBC: 14.9 10*3/uL — ABNORMAL HIGH (ref 4.0–10.5)
nRBC: 0 % (ref 0.0–0.2)

## 2022-01-28 LAB — MAGNESIUM: Magnesium: 2.1 mg/dL (ref 1.7–2.4)

## 2022-01-28 LAB — LEGIONELLA PNEUMOPHILA SEROGP 1 UR AG: L. pneumophila Serogp 1 Ur Ag: NEGATIVE

## 2022-01-28 LAB — AMMONIA: Ammonia: 52 umol/L — ABNORMAL HIGH (ref 9–35)

## 2022-01-28 MED ORDER — ALPRAZOLAM 1 MG PO TABS: 1.0000 mg | ORAL_TABLET | Freq: Three times a day (TID) | ORAL | 0 refills | Status: DC

## 2022-01-28 MED ORDER — ASPIRIN 81 MG PO TABS: 81.0000 mg | ORAL_TABLET | Freq: Every day | ORAL | 2 refills | Status: AC

## 2022-01-28 MED ORDER — LACTULOSE 10 GM/15ML PO SOLN: 20.0000 g | Freq: Two times a day (BID) | ORAL | 0 refills | Status: AC

## 2022-01-28 MED ORDER — GUAIFENESIN ER 600 MG PO TB12: 600.0000 mg | ORAL_TABLET | Freq: Two times a day (BID) | ORAL | 0 refills | Status: AC

## 2022-01-28 MED ORDER — LACTULOSE 10 GM/15ML PO SOLN: 20.0000 g | Freq: Two times a day (BID) | ORAL | 0 refills | Status: DC

## 2022-01-28 MED ORDER — AMOXICILLIN-POT CLAVULANATE 875-125 MG PO TABS: 1.0000 | ORAL_TABLET | Freq: Two times a day (BID) | ORAL | 0 refills | Status: AC

## 2022-01-28 MED ORDER — POTASSIUM CHLORIDE CRYS ER 20 MEQ PO TBCR
40.0000 meq | EXTENDED_RELEASE_TABLET | Freq: Once | ORAL | Status: AC
  Administered 2022-01-28: 40 meq via ORAL
  Filled 2022-01-28: qty 2

## 2022-01-28 MED ORDER — POTASSIUM CHLORIDE CRYS ER 20 MEQ PO TBCR
40.0000 meq | EXTENDED_RELEASE_TABLET | ORAL | Status: DC
  Administered 2022-01-28: 40 meq via ORAL
  Filled 2022-01-28: qty 2

## 2022-01-28 MED ORDER — ALBUTEROL SULFATE HFA 108 (90 BASE) MCG/ACT IN AERS: 2.0000 | INHALATION_SPRAY | RESPIRATORY_TRACT | 1 refills | Status: DC | PRN

## 2022-01-28 MED ORDER — POTASSIUM CHLORIDE ER 10 MEQ PO TBCR: 10.0000 meq | EXTENDED_RELEASE_TABLET | Freq: Every day | ORAL | 2 refills | Status: AC

## 2022-01-28 MED ORDER — POTASSIUM CHLORIDE 10 MEQ/100ML IV SOLN
10.0000 meq | INTRAVENOUS | Status: AC
  Administered 2022-01-28 (×2): 10 meq via INTRAVENOUS
  Filled 2022-01-28 (×2): qty 100

## 2022-01-28 NOTE — Progress Notes (Signed)
Pt resting comfortably throughout this writers shift. Pt has been pleasant when awake and has been ambulating to the bathroom with 1 assist and walker twice during the night. Pt IV site on R forearm appeared infiltrated so IV was removed and assessed. New IV placed to pts L forearm. Pt tolerated well. Will continue to monitor.  ?

## 2022-01-28 NOTE — Discharge Instructions (Addendum)
1)Take lactulose as prescribed--- Goal is to Have at least 2 mushy/Non-solid bowel movements Each day-- ?2) repeat CBC and repeat CMP blood test around Tuesday, 02/01/2022 advised ?3)-May give 1 additional dose of lactulose if no bowel movement in 24 hr period ?

## 2022-01-28 NOTE — NC FL2 (Deleted)
?[place] MEDICAID FL2 LEVEL OF CARE SCREENING TOOL  ?  ? ?IDENTIFICATION  ?Patient Name: ?Heather [name] Birthdate: 1931-05-22 Sex: female Admission Date (Current Location): ?01/26/2022  ?[place] and [place] Number: ? [place] and Address:  ?[facility] [address], [place] ?     Provider Number: ?[id number]  ?Attending Physician Name and Address:  ?Roxan Hockey, MD ? Relative Name and Phone Number:  ?Roche,[name] (Daughter)   [phone] ?   ?Current Level of Care: ?Hospital Recommended Level of Care: ?Assisted Living Facility Prior Approval Number: ?  ? ?Date Approved/Denied: ?  PASRR Number: ?  ? ?Discharge Plan: ?Other (Comment) ([place]) ?  ? ?Current Diagnoses: ?Patient Active Problem List  ? Diagnosis Date Noted  ? Aspiration pneumonia ([place]) 01/27/2022  ? Elevated troponin 01/27/2022  ? Sepsis ([place]) 01/27/2022  ? Multifocal pneumonia 01/27/2022  ? Liver cirrhosis secondary to NASH ([place]) 11/03/2021  ? IBS (irritable bowel syndrome) 08/20/2020  ? Nausea with vomiting 06/18/2020  ? Anorexia 11/11/2019  ? Abdominal pain 08/12/2019  ? Rash and nonspecific skin eruption 08/12/2019  ? Elevated LFTs 08/12/2019  ? Carpal tunnel syndrome, right upper limb 08/31/2018  ? Essential hypertension 06/09/2018  ? Anxiety 06/09/2018  ? GERD (gastroesophageal reflux disease) 06/09/2018  ? Palpitations 04/22/2012  ? Chest pain 04/22/2012  ? Coronary artery disease excluded 04/22/2012  ? Insomnia 04/22/2012  ? ? ?Orientation RESPIRATION BLADDER Height & Weight   ?  ?Self, Time, Situation, Place ? Normal Continent Weight: 176 lb 5.9 oz (80 kg) ?Height:  '5\' 7"'$  (170.2 cm)  ?BEHAVIORAL SYMPTOMS/MOOD NEUROLOGICAL BOWEL NUTRITION STATUS  ?    Continent Diet (Regular)  ?AMBULATORY STATUS COMMUNICATION OF NEEDS Skin   ?Limited Assist Verbally Normal ?  ?  ?  ?    ?     ?     ? ? ?Personal Care Assistance Level of Assistance  ?Bathing, Feeding, Dressing Bathing Assistance:  Limited assistance ?Feeding assistance: Independent ?Dressing Assistance: Limited assistance ?   ? ?Functional Limitations Info  ?Sight, Hearing, Speech Sight Info: Adequate ?Hearing Info: Adequate ?Speech Info: Adequate  ? ? ?SPECIAL CARE FACTORS FREQUENCY  ?    ?  ?  ?  ?  ?  ?  ?   ? ? ?Contractures Contractures Info: Not present  ? ? ?Additional Factors Info  ?Code Status, Allergies Code Status Info: FULL ?Allergies Info: NKA ?  ?  ?  ?   ? ?Current Medications (01/28/2022):  This is the current hospital active medication list ?Current Facility-Administered Medications  ?Medication Dose Route Frequency Provider Last Rate Last Admin  ? acetaminophen (TYLENOL) tablet 650 mg  650 mg Oral Q6H PRN Adefeso, Oladapo, DO   650 mg at 01/28/22 1002  ? Ampicillin-Sulbactam (UNASYN) 3 g in sodium chloride 0.9 % 100 mL IVPB  3 g Intravenous Q8H Erenest Blank, RPH 200 mL/hr at 01/28/22 1127 3 g at 01/28/22 1127  ? azithromycin (ZITHROMAX) tablet 500 mg  500 mg Oral Daily Donnamae Jude, RPH   500 mg at 01/28/22 1601  ? dextromethorphan-guaiFENesin (MUCINEX DM) 30-600 MG per 12 hr tablet 1 tablet  1 tablet Oral BID Adefeso, Oladapo, DO   1 tablet at 01/28/22 0847  ? dicyclomine (BENTYL) capsule 10 mg  10 mg Oral TID AC Adefeso, Oladapo, DO   10 mg at 01/28/22 1130  ? linaclotide (LINZESS) capsule 290 mcg  290 mcg Oral QAC breakfast Adefeso, Oladapo, DO  290 mcg at 01/27/22 0708  ? ondansetron (ZOFRAN) injection 4 mg  4 mg Intravenous Q6H PRN Adefeso, Oladapo, DO      ? pantoprazole (PROTONIX) injection 40 mg  40 mg Intravenous Q24H Adefeso, Oladapo, DO   40 mg at 01/28/22 0347  ? potassium chloride SA (KLOR-CON M) CR tablet 40 mEq  40 mEq Oral Q3H Emokpae, Courage, MD   40 mEq at 01/28/22 1130  ? ? ? ?Discharge Medications: ?Please see discharge summary for a list of discharge medications. ? ?Relevant Imaging Results: ? ?Relevant Lab Results: ? ? ?Additional Information ?SSN: [id number] ? ?Iona Beard, LCSWA ? ? ? ? ?

## 2022-01-28 NOTE — Discharge Summary (Signed)
?                                                                                ? ? ?Heather [name], is a 88 y.o. female  DOB Feb 02, 1931  MRN [id number]. ? ?Admission date:  01/26/2022  Admitting Physician  Bernadette Hoit, DO ? ?Discharge Date:  01/28/2022  ? ?Primary MD  Glenda Chroman, MD ? ?Recommendations for primary care physician for things to follow:  ? ?1)Take lactulose as prescribed--- Goal is to Have at least 2 mushy/Non-solid bowel movements Each day-- ?2)Repeat CBC and repeat CMP blood test around Tuesday, 02/01/2022 advised ? ?Admission Diagnosis  Aspiration pneumonia ([place]) [J69.0] ?Aspiration pneumonia of right lower lobe, unspecified aspiration pneumonia type ([place]) [J69.0] ? ? ?Discharge Diagnosis  Aspiration pneumonia ([place]) [J69.0] ?Aspiration pneumonia of right lower lobe, unspecified aspiration pneumonia type ([place]) [J69.0]   ? ?Principal Problem: ?  Aspiration pneumonia ([place]) ?Active Problems: ?  Liver cirrhosis secondary to NASH [facility]) ?  Sepsis ([place]) ?  Multifocal pneumonia ?  Essential hypertension ?  GERD (gastroesophageal reflux disease) ?  Abdominal pain ?  Nausea with vomiting ?  IBS (irritable bowel syndrome) ?  Elevated troponin ?    ? ?Past Medical History:  ?Diagnosis Date  ? Anxiety   ? GERD (gastroesophageal reflux disease)   ? Hx of shoulder surgery   ? Left Shoulder - pitched nerve.  ? Other and unspecified hyperlipidemia   ? Unspecified essential hypertension   ? ? ?Past Surgical History:  ?Procedure Laterality Date  ? BIOPSY  04/09/2020  ? Procedure: BIOPSY;  Surgeon: Rogene Houston, MD;  [facility];  Service: Endoscopy;;  ? CERVICAL DISC SURGERY    ? [place]  ? colonscopy  08/22/2012  ? moderate diverticulosos in sigmoid and two tubular adenomas  ? ESOPHAGOGASTRODUODENOSCOPY N/A 04/09/2020  ? Rehman:Normal hypopharynx.normal esophagus, z line irreg, 38 cm from incisors, erythematous mucosa in antrum and preplyoric region, gastric antral mucosa with non  specific reactive gastropathy, normal duodenal bul and second portion of duodenum  ? History of shoulder surgery Left   ? pitched nerve.  ? ? ? HPI  from the history and physical done on the day of admission:  ? ? HPI: Heather [name] is a 88 y.o. female with medical history significant for essential hypertension, compression fracture (L2 lumbar vertebra), hypokalemia, irritable bowel syndrome and GERD who presents to the emergency department EMS from nursing home facility due to complaint of nonbloody vomiting after eating chili beans this afternoon.  Vomiting was associated with nausea, she complained of burning sensation in chest, but she denies shortness of breath, fever, chills, diarrhea.  EMS was activated and patient was sent to the ED for further evaluation and management. ?  ?  ?ED Course:  ?In the emergency department, she was intermittently tachypneic, BP 132/58, patient was hypoxic on room air (86-88% per ED physician), this improved with supplemental oxygen at 2 LPM.  Work-up in the ED showed leukocytosis, hypokalemia, hyperglycemia, FOBT was positive, troponin x 2 - 27 > 103.  Patient had a bowel movement while in the ED which was black in nature, stool occult test  done was positive. ?Chest x-ray showed infiltrates in the right lower lung suggesting pneumonia ?CT abdomen and pelvis with contrast showed right perihilar and basilar infiltration possibly representing ?multifocal pneumonia or aspiration. No evidence of bowel obstruction or inflammation ?Patient was treated with IV ceftriaxone and azithromycin.  IV hydration was provided, potassium was replenished.  Hospitalist was asked to admit patient for further evaluation and management. ?  ?Review of Systems: ?Review of systems as noted in the HPI. All other systems reviewed and are negative. ? ? Hospital Course:  ? ? ?Assessment and Plan: ? ?Brief Summary:- ?88 y.o. female with medical history significant for essential hypertension, compression  fracture (L2 lumbar vertebra), hypokalemia, irritable bowel syndrome and GERD admitted on 01/27/2022 with sepsis secondary to aspiration pneumonia ?  ?  ?A/p ?1)Sepsis secondary to presumed aspiration pneumonia ?-Speech evaluation appreciated recommends regular diet and thin liquid ?-Clinical exam and imaging studies consistent with aspiration pneumonia ?-Strep pneumo antigen negative, Legionella antigen pending ?-Patient was treated with Unasyn and azithromycin ?-Bronchodilators and mucolytics/ordered ?-Respiratory status improved significantly patient has been weaned off oxygen at this time ?-Okay to discharge on Augmentin ?  ?2)Abdominal pain with nausea vomiting---- ?Hiatal hernia/GERD-- ?emesis most likely contributed to #1 above ?-CT abdomen and pelvis without acute findings ?PPI and and Carafate as ordered  ? ?3)Hypokalemia--- in the setting of emesis, replace and recheck BMP on 02/01/2022 ?-Potassium supplementation prescribed given ongoing use of Lasix and lactulose ?-Chlorthalidone is to be discontinued ?  ?4)HTN--stop chlorthalidone due to persistent hypokalemia concerns for dehydration , especially in the setting of concomitant Lasix use  ?-Okay to restart amlodipine  ? ?5)Elevated Troponin --- pattern not consistent with ACS, suspect type II demand ischemia secondary to sepsis/pneumonia with hypoxia ?-EKG sinus rhythm with incomplete left bundle branch block, compared to EKG from May 2020 no significant change incomplete liver branch block with previously present ?-Patient remains asymptomatic from a cardiovascular standpoint at this time ? ?6)NASH Liver cirrhosis--- serum ammonia is 52 ?-Lactulose as prescribed ?-May give 1 additional dose of lactulose if no bowel movement in 24 hr period ? ? ?Discharge Condition: stable, discharge back to ALF with home health physical therapy ?--Called and updated patient's daughter [name]) prior to discharge, discharge plan of care reviewed, questions answered ? ?Follow  UP--with PCP ?  ?Consults obtained -  ? ?Diet and Activity recommendation:  As advised ? ?Discharge Instructions   ? ?Discharge Instructions   ? ? Call MD for:  difficulty breathing, headache or visual disturbances   Complete by: As directed ?  ? Call MD for:  persistant dizziness or light-headedness   Complete by: As directed ?  ? Call MD for:  persistant nausea and vomiting   Complete by: As directed ?  ? Call MD for:  temperature >100.4   Complete by: As directed ?  ? Diet general   Complete by: As directed ?  ? Discharge instructions   Complete by: As directed ?  ? 1)Take lactulose as prescribed--- Goal is to Have at least 2 mushy/Non-solid bowel movements Each day-- ?2) repeat CBC and repeat CMP blood test around Tuesday, 02/01/2022 advised  ? Discharge instructions   Complete by: As directed ?  ? 1)Take lactulose as prescribed--- Goal is to Have at least 2 mushy/Non-solid bowel movements Each day-- ?2) repeat CBC and repeat CMP blood test around Tuesday, 02/01/2022 advised ?3)-May give 1 additional dose of lactulose if no bowel movement in 24 hr period  ? Face-to-face encounter (required for  Medicare/Medicaid patients)   Complete by: As directed ?  ? I Rain [place] certify that this patient is under my care and that I, or a nurse practitioner or physician's assistant working with me, had a face-to-face encounter that meets the physician face-to-face encounter requirements with this patient on 01/28/2022. The encounter with the patient was in whole, or in part for the following medical condition(s) which is the primary reason for home health care (List medical condition):  ?- ?Generalized weakness and deconditioning  ? The encounter with the patient was in whole, or in part, for the following medical condition, which is the primary reason for home health care: Generalized weakness and deconditioning  ? I certify that, based on my findings, the following services are medically necessary home health services:  Physical therapy  ? Reason for Medically Necessary Home Health Services: Skilled Nursing- Change/Decline in Patient Status  ? My clinical findings support the need for the above services: Unable to leave ho

## 2022-01-28 NOTE — [place] FL2 (Signed)
?[place] MEDICAID FL2 LEVEL OF CARE SCREENING TOOL  ?  ? ?IDENTIFICATION  ?Patient Name: ?Heather [name] Birthdate: Nov 25, 1930 Sex: female Admission Date (Current Location): ?01/26/2022  ?[place] and [place] Number: ? [place] and Address:  ?[facility] [address], [place] ?     Provider Number: ?[id number]  ?Attending Physician Name and Address:  ?Roxan Hockey, MD ? Relative Name and Phone Number:  ?Roche,[name] (Daughter)   [phone] ?   ?Current Level of Care: ?Hospital Recommended Level of Care: ?Assisted Living Facility Prior Approval Number: ?  ? ?Date Approved/Denied: ?  PASRR Number: ?  ? ?Discharge Plan: ?Other (Comment) ([place]) ?  ? ?Current Diagnoses: ?Patient Active Problem List  ? Diagnosis Date Noted  ? Aspiration pneumonia ([place]) 01/27/2022  ? Elevated troponin 01/27/2022  ? Sepsis ([place]) 01/27/2022  ? Multifocal pneumonia 01/27/2022  ? Liver cirrhosis secondary to NASH ([place]) 11/03/2021  ? IBS (irritable bowel syndrome) 08/20/2020  ? Nausea with vomiting 06/18/2020  ? Anorexia 11/11/2019  ? Abdominal pain 08/12/2019  ? Rash and nonspecific skin eruption 08/12/2019  ? Elevated LFTs 08/12/2019  ? Carpal tunnel syndrome, right upper limb 08/31/2018  ? Essential hypertension 06/09/2018  ? Anxiety 06/09/2018  ? GERD (gastroesophageal reflux disease) 06/09/2018  ? Palpitations 04/22/2012  ? Chest pain 04/22/2012  ? Coronary artery disease excluded 04/22/2012  ? Insomnia 04/22/2012  ? ? ?Orientation RESPIRATION BLADDER Height & Weight   ?  ?Self, Time, Situation, Place ? Normal Continent Weight: 176 lb 5.9 oz (80 kg) ?Height:  '5\' 7"'$  (170.2 cm)  ?BEHAVIORAL SYMPTOMS/MOOD NEUROLOGICAL BOWEL NUTRITION STATUS  ?    Continent Diet (Regular)  ?AMBULATORY STATUS COMMUNICATION OF NEEDS Skin   ?Limited Assist Verbally Normal ?  ?  ?  ?    ?     ?     ? ? ?Personal Care Assistance Level of Assistance  ?Bathing, Feeding, Dressing Bathing Assistance:  Limited assistance ?Feeding assistance: Independent ?Dressing Assistance: Limited assistance ?   ? ?Functional Limitations Info  ?Sight, Hearing, Speech Sight Info: Adequate ?Hearing Info: Adequate ?Speech Info: Adequate  ? ? ?SPECIAL CARE FACTORS FREQUENCY  ?    ?  ?  ?  ?  ?  ?  ?   ? ? ?Contractures Contractures Info: Not present  ? ? ?Additional Factors Info  ?Code Status, Allergies Code Status Info: FULL ?Allergies Info: NKA ?  ?  ?  ?   ? ?Current Medications (01/28/2022):  This is the current hospital active medication list ?Current Facility-Administered Medications  ?Medication Dose Route Frequency Provider Last Rate Last Admin  ? acetaminophen (TYLENOL) tablet 650 mg  650 mg Oral Q6H PRN Adefeso, Oladapo, DO   650 mg at 01/28/22 1002  ? Ampicillin-Sulbactam (UNASYN) 3 g in sodium chloride 0.9 % 100 mL IVPB  3 g Intravenous Q8H Erenest Blank, RPH 200 mL/hr at 01/28/22 1127 3 g at 01/28/22 1127  ? azithromycin (ZITHROMAX) tablet 500 mg  500 mg Oral Daily Donnamae Jude, RPH   500 mg at 01/28/22 1443  ? dextromethorphan-guaiFENesin (MUCINEX DM) 30-600 MG per 12 hr tablet 1 tablet  1 tablet Oral BID Adefeso, Oladapo, DO   1 tablet at 01/28/22 0847  ? dicyclomine (BENTYL) capsule 10 mg  10 mg Oral TID AC Adefeso, Oladapo, DO   10 mg at 01/28/22 1130  ? linaclotide (LINZESS) capsule 290 mcg  290 mcg Oral QAC breakfast Adefeso, Oladapo, DO  290 mcg at 01/27/22 0708  ? ondansetron (ZOFRAN) injection 4 mg  4 mg Intravenous Q6H PRN Adefeso, Oladapo, DO      ? pantoprazole (PROTONIX) injection 40 mg  40 mg Intravenous Q24H Adefeso, Oladapo, DO   40 mg at 01/28/22 0347  ? potassium chloride SA (KLOR-CON M) CR tablet 40 mEq  40 mEq Oral Q3H Emokpae, Courage, MD   40 mEq at 01/28/22 1130  ? ? ? ?Discharge Medications: ?acetaminophen 500 MG tablet ?Commonly known as: TYLENOL ?Take 500 mg by mouth every 4 (four) hours as needed for mild pain, moderate pain or fever. ?   ?albuterol 108 (90 Base) MCG/ACT inhaler ?Commonly  known as: VENTOLIN HFA ?Inhale 2 puffs into the lungs every 4 (four) hours as needed for wheezing or shortness of breath. ?   ?ALPRAZolam 1 MG tablet ?Commonly known as: Duanne Moron ?Take 1 tablet (1 mg total) by mouth 3 (three) times daily. ?   ?alum & mag hydroxide-simeth 200-200-20 MG/5ML suspension ?Commonly known as: MAALOX/MYLANTA ?Take 30 mLs by mouth every 6 (six) hours as needed for indigestion or heartburn. ?   ?amLODipine 10 MG tablet ?Commonly known as: NORVASC ?Take 10 mg by mouth every morning. ?   ?amoxicillin-clavulanate 875-125 MG tablet ?Commonly known as: Augmentin ?Take 1 tablet by mouth 2 (two) times daily for 5 days. ?   ?aspirin 81 MG tablet ?Take 1 tablet (81 mg total) by mouth daily with breakfast. ?What changed: when to take this ?   ?CAL-MAG-ZINC PO ?Take 1 tablet by mouth daily. ?   ?Diclofenac Sodium 3 % Gel ?Apply 1-2 g topically in the morning, at noon, and at bedtime. ?   ?dicyclomine 10 MG capsule ?Commonly known as: BENTYL ?TAKE ONE CAPSULE BY MOUTH FOUR TIMES DAILY BEFORE MEALS and AT BEDTIME ?What changed: See the new instructions. ?   ?ferrous sulfate 325 (65 FE) MG tablet ?Take 325 mg by mouth daily with breakfast. ?   ?fluticasone furoate-vilanterol 100-25 MCG/ACT Aepb ?Commonly known as: BREO ELLIPTA ?Inhale 1 puff into the lungs daily. ?   ?furosemide 40 MG tablet ?Commonly known as: LASIX ?Take 40 mg by mouth daily. ?   ?gabapentin 300 MG capsule ?Commonly known as: NEURONTIN ?Take 300 mg by mouth daily. ?   ?guaiFENesin 600 MG 12 hr tablet ?Commonly known as: Mucinex ?Take 1 tablet (600 mg total) by mouth 2 (two) times daily for 10 days. ?   ?isosorbide mononitrate 60 MG 24 hr tablet ?Commonly known as: IMDUR ?Take 60 mg by mouth daily. ?   ?lactulose 10 GM/15ML solution ?Commonly known as: [place] ?Take 30 mLs (20 g total) by mouth 2 (two) times daily. Goal is to Have at least 2 mushy/Non-solid bowel movements Each day-- ?May give 1 additional dose of lactulose if no bowel  movement in 24 hr period ?   ?metoprolol tartrate 25 MG tablet ?Commonly known as: LOPRESSOR ?Take 25 mg by mouth 2 (two) times daily. ?   ?nitroGLYCERIN 0.4 MG SL tablet ?Commonly known as: NITROSTAT ?Place 0.4 mg under the tongue every 5 (five) minutes as needed for chest pain. ?   ?omeprazole 40 MG capsule ?Commonly known as: PRILOSEC ?Take 40 mg by mouth daily. ?   ?Opcon-A 0.027-0.315 % Soln ?Generic drug: Naphazoline-Pheniramine ?Place 1 drop into both eyes 2 (two) times daily as needed (dry eyes). ?   ?potassium chloride 10 MEQ tablet ?Commonly known as: KLOR-CON ?Take 1 tablet (10 mEq total) by mouth daily. Take While taking  Lactulose ?   ?sucralfate 1 g tablet ?Commonly known as: CARAFATE ?Take 1 g by mouth 4 (four) times daily. ?   ? ? ?Relevant Imaging Results: ? ?Relevant Lab Results: ? ? ?Additional Information ?SSN: [id number] ? ?Iona Beard, LCSWA ? ? ? ? ?

## 2022-01-29 DIAGNOSIS — J690 Pneumonitis due to inhalation of food and vomit: Secondary | ICD-10-CM | POA: Diagnosis not present

## 2022-01-29 LAB — URINE CULTURE: Culture: NO GROWTH

## 2022-01-31 DIAGNOSIS — Z9181 History of falling: Secondary | ICD-10-CM | POA: Diagnosis not present

## 2022-01-31 DIAGNOSIS — A419 Sepsis, unspecified organism: Secondary | ICD-10-CM | POA: Diagnosis not present

## 2022-01-31 DIAGNOSIS — Z791 Long term (current) use of non-steroidal anti-inflammatories (NSAID): Secondary | ICD-10-CM | POA: Diagnosis not present

## 2022-01-31 DIAGNOSIS — K760 Fatty (change of) liver, not elsewhere classified: Secondary | ICD-10-CM | POA: Diagnosis not present

## 2022-01-31 DIAGNOSIS — K589 Irritable bowel syndrome without diarrhea: Secondary | ICD-10-CM | POA: Diagnosis not present

## 2022-01-31 DIAGNOSIS — K7460 Unspecified cirrhosis of liver: Secondary | ICD-10-CM | POA: Diagnosis not present

## 2022-01-31 DIAGNOSIS — E785 Hyperlipidemia, unspecified: Secondary | ICD-10-CM | POA: Diagnosis not present

## 2022-01-31 DIAGNOSIS — Z7982 Long term (current) use of aspirin: Secondary | ICD-10-CM | POA: Diagnosis not present

## 2022-01-31 DIAGNOSIS — S32028D Other fracture of second lumbar vertebra, subsequent encounter for fracture with routine healing: Secondary | ICD-10-CM | POA: Diagnosis not present

## 2022-01-31 DIAGNOSIS — K7581 Nonalcoholic steatohepatitis (NASH): Secondary | ICD-10-CM | POA: Diagnosis not present

## 2022-01-31 DIAGNOSIS — K219 Gastro-esophageal reflux disease without esophagitis: Secondary | ICD-10-CM | POA: Diagnosis not present

## 2022-01-31 DIAGNOSIS — K449 Diaphragmatic hernia without obstruction or gangrene: Secondary | ICD-10-CM | POA: Diagnosis not present

## 2022-01-31 DIAGNOSIS — Z7951 Long term (current) use of inhaled steroids: Secondary | ICD-10-CM | POA: Diagnosis not present

## 2022-01-31 DIAGNOSIS — F419 Anxiety disorder, unspecified: Secondary | ICD-10-CM | POA: Diagnosis not present

## 2022-01-31 DIAGNOSIS — I10 Essential (primary) hypertension: Secondary | ICD-10-CM | POA: Diagnosis not present

## 2022-01-31 DIAGNOSIS — J690 Pneumonitis due to inhalation of food and vomit: Secondary | ICD-10-CM | POA: Diagnosis not present

## 2022-01-31 DIAGNOSIS — E876 Hypokalemia: Secondary | ICD-10-CM | POA: Diagnosis not present

## 2022-02-01 LAB — CULTURE, BLOOD (ROUTINE X 2)
Culture: NO GROWTH
Culture: NO GROWTH
Special Requests: ADEQUATE
Special Requests: ADEQUATE

## 2022-02-02 DIAGNOSIS — M25561 Pain in right knee: Secondary | ICD-10-CM | POA: Diagnosis not present

## 2022-02-02 DIAGNOSIS — S32020S Wedge compression fracture of second lumbar vertebra, sequela: Secondary | ICD-10-CM | POA: Diagnosis not present

## 2022-02-03 ENCOUNTER — Ambulatory Visit (INDEPENDENT_AMBULATORY_CARE_PROVIDER_SITE_OTHER): Payer: PPO | Admitting: Gastroenterology

## 2022-02-03 DIAGNOSIS — F419 Anxiety disorder, unspecified: Secondary | ICD-10-CM | POA: Diagnosis not present

## 2022-02-03 DIAGNOSIS — K7460 Unspecified cirrhosis of liver: Secondary | ICD-10-CM | POA: Diagnosis not present

## 2022-02-03 DIAGNOSIS — A419 Sepsis, unspecified organism: Secondary | ICD-10-CM | POA: Diagnosis not present

## 2022-02-03 DIAGNOSIS — E785 Hyperlipidemia, unspecified: Secondary | ICD-10-CM | POA: Diagnosis not present

## 2022-02-03 DIAGNOSIS — Z299 Encounter for prophylactic measures, unspecified: Secondary | ICD-10-CM | POA: Diagnosis not present

## 2022-02-03 DIAGNOSIS — I10 Essential (primary) hypertension: Secondary | ICD-10-CM | POA: Diagnosis not present

## 2022-02-03 DIAGNOSIS — Z789 Other specified health status: Secondary | ICD-10-CM | POA: Diagnosis not present

## 2022-02-03 DIAGNOSIS — J690 Pneumonitis due to inhalation of food and vomit: Secondary | ICD-10-CM | POA: Diagnosis not present

## 2022-02-03 DIAGNOSIS — E876 Hypokalemia: Secondary | ICD-10-CM | POA: Diagnosis not present

## 2022-02-09 ENCOUNTER — Other Ambulatory Visit: Payer: Self-pay | Admitting: Physician Assistant

## 2022-02-09 ENCOUNTER — Ambulatory Visit (INDEPENDENT_AMBULATORY_CARE_PROVIDER_SITE_OTHER): Payer: PPO | Admitting: Orthopaedic Surgery

## 2022-02-09 ENCOUNTER — Encounter: Payer: Self-pay | Admitting: Orthopaedic Surgery

## 2022-02-09 ENCOUNTER — Ambulatory Visit (INDEPENDENT_AMBULATORY_CARE_PROVIDER_SITE_OTHER): Payer: PPO

## 2022-02-09 DIAGNOSIS — M5440 Lumbago with sciatica, unspecified side: Secondary | ICD-10-CM | POA: Diagnosis not present

## 2022-02-09 DIAGNOSIS — G8929 Other chronic pain: Secondary | ICD-10-CM

## 2022-02-09 DIAGNOSIS — M545 Low back pain, unspecified: Secondary | ICD-10-CM

## 2022-02-09 MED ORDER — HYDROCODONE-ACETAMINOPHEN 5-325 MG PO TABS: 1.0000 | ORAL_TABLET | Freq: Two times a day (BID) | ORAL | 0 refills | Status: DC | PRN

## 2022-02-09 NOTE — Progress Notes (Signed)
? ?Office Visit Note ?  ?Patient: Heather [name]           ?Date of Birth: January 22, 1931           ?MRN: [id number] ?Visit Date: 02/09/2022 ?             ?Requested by: Glenda Chroman, MD ?[address] ?[place],  [place] [address] ?PCP: Glenda Chroman, MD ? ? ?Assessment & Plan: ?Visit Diagnoses: No diagnosis found. ? ?Plan: Patient is a pleasant 88 year old woman with a history of lower back pain.  She is several years status post kyphoplasty of T12.  Most recently in February she fell on her back and had an L2 compression fracture.  She is at an assisted living and was doing fairly well but with therapy yesterday she has had an increase in her lower back pain.  She says the pain wraps around to her thighs anteriorly.  Exam was consistent with lower back pain.  An MRI done 2 months ago demonstrated acute inferior endplate fracture of L2 without canal compromise multilevel disc disease and facet disease with moderate spinal and bilateral lateral recess stenosis we will fit her for a back support today.  If this does not help her could consider epidural steroid injections. Though she complained of thigh pain she had no groin, pain and painless good range of motion ? ?Follow-Up Instructions: No follow-ups on file.  ? ?Orders:  ?No orders of the defined types were placed in this encounter. ? ?No orders of the defined types were placed in this encounter. ? ? ? ? Procedures: ?No procedures performed ? ? ?Clinical Data: ?No additional findings. ? ? ?Subjective: ?Chief Complaint  ?Patient presents with  ? Lower Back - Pain  ?Patient presents today for her lower back. She states that she fell onto her buttock when trying to sit on her walker two months ago. She said that the brakes were not locked and she fell. She did not have any pain in her lower back. Two weeks later she woke up and could not walk.  She went to the ED at [facility] and had x-rays taken. She was told that she had a compression fracture at L2. She went to rehab  and then to assisted living, where she still is. She has been doing physical therapy and noticed that her lower back started to hurt today after therapy. She does have a history of a kyphoplasty in her mid back many years ago.  ? ? ? ?Review of Systems  ?All other systems reviewed and are negative. ? ? ?Objective: ?Vital Signs: There were no vitals taken for this visit. ? ?Physical Exam ?Constitutional:   ?   Appearance: Normal appearance.  ?Pulmonary:  ?   Effort: Pulmonary effort is normal.  ?Musculoskeletal:     ?   General: Swelling present.  ?Skin: ?   General: Skin is warm and dry.  ?Neurological:  ?   General: No focal deficit present.  ?   Mental Status: She is alert.  ? ? ?Ortho Exam ?Lower back: no tenderness to palpation of the lower back. No crepitation felt. No pain with hip range of motion. Strength is fair and equal bilaterally. Sensation is intact ?Specialty Comments:  ?No specialty comments available. ? ?Imaging: ?No results found. ? ? ?PMFS History: ?Patient Active Problem List  ? Diagnosis Date Noted  ? Aspiration pneumonia ([place]) 01/27/2022  ? Elevated troponin 01/27/2022  ? Sepsis ([place]) 01/27/2022  ? Multifocal pneumonia 01/27/2022  ?  Liver cirrhosis secondary to NASH ([place]) 11/03/2021  ? IBS (irritable bowel syndrome) 08/20/2020  ? Nausea with vomiting 06/18/2020  ? Anorexia 11/11/2019  ? Abdominal pain 08/12/2019  ? Rash and nonspecific skin eruption 08/12/2019  ? Elevated LFTs 08/12/2019  ? Carpal tunnel syndrome, right upper limb 08/31/2018  ? Essential hypertension 06/09/2018  ? Anxiety 06/09/2018  ? GERD (gastroesophageal reflux disease) 06/09/2018  ? Palpitations 04/22/2012  ? Chest pain 04/22/2012  ? Coronary artery disease excluded 04/22/2012  ? Insomnia 04/22/2012  ? ?Past Medical History:  ?Diagnosis Date  ? Anxiety   ? GERD (gastroesophageal reflux disease)   ? Hx of shoulder surgery   ? Left Shoulder - pitched nerve.  ? Other and unspecified hyperlipidemia   ? Unspecified essential  hypertension   ?  ?Family History  ?Problem Relation Age of Onset  ? Hypertension Other   ? Heart attack Sister   ?  ?Past Surgical History:  ?Procedure Laterality Date  ? BIOPSY  04/09/2020  ? Procedure: BIOPSY;  Surgeon: Rogene Houston, MD;  Location: [facility];  Service: Endoscopy;;  ? CERVICAL DISC SURGERY    ? [place]  ? colonscopy  08/22/2012  ? moderate diverticulosos in sigmoid and two tubular adenomas  ? ESOPHAGOGASTRODUODENOSCOPY N/A 04/09/2020  ? [place]:Normal hypopharynx.normal esophagus, z line irreg, 38 cm from incisors, erythematous mucosa in antrum and preplyoric region, gastric antral mucosa with non specific reactive gastropathy, normal duodenal bul and second portion of duodenum  ? History of shoulder surgery Left   ? pitched nerve.  ? ?Social History  ? ?Occupational History  ? Not on file  ?Tobacco Use  ? Smoking status: Never  ? Smokeless tobacco: Never  ?Vaping Use  ? Vaping Use: Never used  ?Substance and Sexual Activity  ? Alcohol use: No  ?  Alcohol/week: 0.0 standard drinks  ? Drug use: No  ? Sexual activity: Not on file  ? ? ? ? ? ? ?

## 2022-02-10 DIAGNOSIS — K760 Fatty (change of) liver, not elsewhere classified: Secondary | ICD-10-CM | POA: Diagnosis not present

## 2022-02-10 DIAGNOSIS — I10 Essential (primary) hypertension: Secondary | ICD-10-CM | POA: Diagnosis not present

## 2022-02-10 DIAGNOSIS — K219 Gastro-esophageal reflux disease without esophagitis: Secondary | ICD-10-CM | POA: Diagnosis not present

## 2022-02-10 DIAGNOSIS — K588 Other irritable bowel syndrome: Secondary | ICD-10-CM | POA: Diagnosis not present

## 2022-02-16 DIAGNOSIS — S32028D Other fracture of second lumbar vertebra, subsequent encounter for fracture with routine healing: Secondary | ICD-10-CM | POA: Diagnosis not present

## 2022-02-16 DIAGNOSIS — K7581 Nonalcoholic steatohepatitis (NASH): Secondary | ICD-10-CM | POA: Diagnosis not present

## 2022-02-16 DIAGNOSIS — I10 Essential (primary) hypertension: Secondary | ICD-10-CM | POA: Diagnosis not present

## 2022-02-16 DIAGNOSIS — Z7951 Long term (current) use of inhaled steroids: Secondary | ICD-10-CM | POA: Diagnosis not present

## 2022-02-16 DIAGNOSIS — F419 Anxiety disorder, unspecified: Secondary | ICD-10-CM | POA: Diagnosis not present

## 2022-02-16 DIAGNOSIS — A419 Sepsis, unspecified organism: Secondary | ICD-10-CM | POA: Diagnosis not present

## 2022-02-16 DIAGNOSIS — J690 Pneumonitis due to inhalation of food and vomit: Secondary | ICD-10-CM | POA: Diagnosis not present

## 2022-02-16 DIAGNOSIS — K7460 Unspecified cirrhosis of liver: Secondary | ICD-10-CM | POA: Diagnosis not present

## 2022-02-16 DIAGNOSIS — K219 Gastro-esophageal reflux disease without esophagitis: Secondary | ICD-10-CM | POA: Diagnosis not present

## 2022-02-16 DIAGNOSIS — E876 Hypokalemia: Secondary | ICD-10-CM | POA: Diagnosis not present

## 2022-02-16 DIAGNOSIS — K589 Irritable bowel syndrome without diarrhea: Secondary | ICD-10-CM | POA: Diagnosis not present

## 2022-02-16 DIAGNOSIS — K760 Fatty (change of) liver, not elsewhere classified: Secondary | ICD-10-CM | POA: Diagnosis not present

## 2022-02-16 DIAGNOSIS — K449 Diaphragmatic hernia without obstruction or gangrene: Secondary | ICD-10-CM | POA: Diagnosis not present

## 2022-02-16 DIAGNOSIS — Z9181 History of falling: Secondary | ICD-10-CM | POA: Diagnosis not present

## 2022-02-16 DIAGNOSIS — Z7982 Long term (current) use of aspirin: Secondary | ICD-10-CM | POA: Diagnosis not present

## 2022-02-16 DIAGNOSIS — Z791 Long term (current) use of non-steroidal anti-inflammatories (NSAID): Secondary | ICD-10-CM | POA: Diagnosis not present

## 2022-02-16 DIAGNOSIS — E785 Hyperlipidemia, unspecified: Secondary | ICD-10-CM | POA: Diagnosis not present

## 2022-02-17 DIAGNOSIS — K760 Fatty (change of) liver, not elsewhere classified: Secondary | ICD-10-CM | POA: Diagnosis not present

## 2022-02-17 DIAGNOSIS — F419 Anxiety disorder, unspecified: Secondary | ICD-10-CM | POA: Diagnosis not present

## 2022-02-17 DIAGNOSIS — K7460 Unspecified cirrhosis of liver: Secondary | ICD-10-CM | POA: Diagnosis not present

## 2022-02-18 DIAGNOSIS — K760 Fatty (change of) liver, not elsewhere classified: Secondary | ICD-10-CM | POA: Diagnosis not present

## 2022-02-18 DIAGNOSIS — S32028D Other fracture of second lumbar vertebra, subsequent encounter for fracture with routine healing: Secondary | ICD-10-CM | POA: Diagnosis not present

## 2022-02-18 DIAGNOSIS — K589 Irritable bowel syndrome without diarrhea: Secondary | ICD-10-CM | POA: Diagnosis not present

## 2022-02-18 DIAGNOSIS — E876 Hypokalemia: Secondary | ICD-10-CM | POA: Diagnosis not present

## 2022-02-18 DIAGNOSIS — F419 Anxiety disorder, unspecified: Secondary | ICD-10-CM | POA: Diagnosis not present

## 2022-02-18 DIAGNOSIS — K449 Diaphragmatic hernia without obstruction or gangrene: Secondary | ICD-10-CM | POA: Diagnosis not present

## 2022-02-18 DIAGNOSIS — E785 Hyperlipidemia, unspecified: Secondary | ICD-10-CM | POA: Diagnosis not present

## 2022-02-18 DIAGNOSIS — Z9181 History of falling: Secondary | ICD-10-CM | POA: Diagnosis not present

## 2022-02-18 DIAGNOSIS — J690 Pneumonitis due to inhalation of food and vomit: Secondary | ICD-10-CM | POA: Diagnosis not present

## 2022-02-18 DIAGNOSIS — Z7951 Long term (current) use of inhaled steroids: Secondary | ICD-10-CM | POA: Diagnosis not present

## 2022-02-18 DIAGNOSIS — A419 Sepsis, unspecified organism: Secondary | ICD-10-CM | POA: Diagnosis not present

## 2022-02-18 DIAGNOSIS — K219 Gastro-esophageal reflux disease without esophagitis: Secondary | ICD-10-CM | POA: Diagnosis not present

## 2022-02-18 DIAGNOSIS — I10 Essential (primary) hypertension: Secondary | ICD-10-CM | POA: Diagnosis not present

## 2022-02-18 DIAGNOSIS — K7581 Nonalcoholic steatohepatitis (NASH): Secondary | ICD-10-CM | POA: Diagnosis not present

## 2022-02-18 DIAGNOSIS — Z7982 Long term (current) use of aspirin: Secondary | ICD-10-CM | POA: Diagnosis not present

## 2022-02-18 DIAGNOSIS — Z79899 Other long term (current) drug therapy: Secondary | ICD-10-CM | POA: Diagnosis not present

## 2022-02-18 DIAGNOSIS — Z791 Long term (current) use of non-steroidal anti-inflammatories (NSAID): Secondary | ICD-10-CM | POA: Diagnosis not present

## 2022-02-18 DIAGNOSIS — K7460 Unspecified cirrhosis of liver: Secondary | ICD-10-CM | POA: Diagnosis not present

## 2022-02-19 DIAGNOSIS — N390 Urinary tract infection, site not specified: Secondary | ICD-10-CM | POA: Diagnosis not present

## 2022-02-24 DIAGNOSIS — F419 Anxiety disorder, unspecified: Secondary | ICD-10-CM | POA: Diagnosis not present

## 2022-02-24 DIAGNOSIS — Z7982 Long term (current) use of aspirin: Secondary | ICD-10-CM | POA: Diagnosis not present

## 2022-02-24 DIAGNOSIS — E785 Hyperlipidemia, unspecified: Secondary | ICD-10-CM | POA: Diagnosis not present

## 2022-02-24 DIAGNOSIS — Z791 Long term (current) use of non-steroidal anti-inflammatories (NSAID): Secondary | ICD-10-CM | POA: Diagnosis not present

## 2022-02-24 DIAGNOSIS — K589 Irritable bowel syndrome without diarrhea: Secondary | ICD-10-CM | POA: Diagnosis not present

## 2022-02-24 DIAGNOSIS — K7581 Nonalcoholic steatohepatitis (NASH): Secondary | ICD-10-CM | POA: Diagnosis not present

## 2022-02-24 DIAGNOSIS — J690 Pneumonitis due to inhalation of food and vomit: Secondary | ICD-10-CM | POA: Diagnosis not present

## 2022-02-24 DIAGNOSIS — A419 Sepsis, unspecified organism: Secondary | ICD-10-CM | POA: Diagnosis not present

## 2022-02-24 DIAGNOSIS — E876 Hypokalemia: Secondary | ICD-10-CM | POA: Diagnosis not present

## 2022-02-24 DIAGNOSIS — Z9181 History of falling: Secondary | ICD-10-CM | POA: Diagnosis not present

## 2022-02-24 DIAGNOSIS — I10 Essential (primary) hypertension: Secondary | ICD-10-CM | POA: Diagnosis not present

## 2022-02-24 DIAGNOSIS — K449 Diaphragmatic hernia without obstruction or gangrene: Secondary | ICD-10-CM | POA: Diagnosis not present

## 2022-02-24 DIAGNOSIS — K7460 Unspecified cirrhosis of liver: Secondary | ICD-10-CM | POA: Diagnosis not present

## 2022-02-24 DIAGNOSIS — S32028D Other fracture of second lumbar vertebra, subsequent encounter for fracture with routine healing: Secondary | ICD-10-CM | POA: Diagnosis not present

## 2022-02-24 DIAGNOSIS — K760 Fatty (change of) liver, not elsewhere classified: Secondary | ICD-10-CM | POA: Diagnosis not present

## 2022-02-24 DIAGNOSIS — K219 Gastro-esophageal reflux disease without esophagitis: Secondary | ICD-10-CM | POA: Diagnosis not present

## 2022-02-24 DIAGNOSIS — Z7951 Long term (current) use of inhaled steroids: Secondary | ICD-10-CM | POA: Diagnosis not present

## 2022-02-25 DIAGNOSIS — A419 Sepsis, unspecified organism: Secondary | ICD-10-CM | POA: Diagnosis not present

## 2022-02-25 DIAGNOSIS — J690 Pneumonitis due to inhalation of food and vomit: Secondary | ICD-10-CM | POA: Diagnosis not present

## 2022-02-25 DIAGNOSIS — K7460 Unspecified cirrhosis of liver: Secondary | ICD-10-CM | POA: Diagnosis not present

## 2022-02-25 DIAGNOSIS — F419 Anxiety disorder, unspecified: Secondary | ICD-10-CM | POA: Diagnosis not present

## 2022-02-25 DIAGNOSIS — K760 Fatty (change of) liver, not elsewhere classified: Secondary | ICD-10-CM | POA: Diagnosis not present

## 2022-02-25 DIAGNOSIS — K219 Gastro-esophageal reflux disease without esophagitis: Secondary | ICD-10-CM | POA: Diagnosis not present

## 2022-02-25 DIAGNOSIS — Z9181 History of falling: Secondary | ICD-10-CM | POA: Diagnosis not present

## 2022-02-25 DIAGNOSIS — Z7951 Long term (current) use of inhaled steroids: Secondary | ICD-10-CM | POA: Diagnosis not present

## 2022-02-25 DIAGNOSIS — I10 Essential (primary) hypertension: Secondary | ICD-10-CM | POA: Diagnosis not present

## 2022-02-25 DIAGNOSIS — E876 Hypokalemia: Secondary | ICD-10-CM | POA: Diagnosis not present

## 2022-02-25 DIAGNOSIS — Z791 Long term (current) use of non-steroidal anti-inflammatories (NSAID): Secondary | ICD-10-CM | POA: Diagnosis not present

## 2022-02-25 DIAGNOSIS — K449 Diaphragmatic hernia without obstruction or gangrene: Secondary | ICD-10-CM | POA: Diagnosis not present

## 2022-02-25 DIAGNOSIS — K589 Irritable bowel syndrome without diarrhea: Secondary | ICD-10-CM | POA: Diagnosis not present

## 2022-02-25 DIAGNOSIS — S32028D Other fracture of second lumbar vertebra, subsequent encounter for fracture with routine healing: Secondary | ICD-10-CM | POA: Diagnosis not present

## 2022-02-25 DIAGNOSIS — K7581 Nonalcoholic steatohepatitis (NASH): Secondary | ICD-10-CM | POA: Diagnosis not present

## 2022-02-25 DIAGNOSIS — Z7982 Long term (current) use of aspirin: Secondary | ICD-10-CM | POA: Diagnosis not present

## 2022-02-25 DIAGNOSIS — E785 Hyperlipidemia, unspecified: Secondary | ICD-10-CM | POA: Diagnosis not present

## 2022-03-01 DIAGNOSIS — Z7982 Long term (current) use of aspirin: Secondary | ICD-10-CM | POA: Diagnosis not present

## 2022-03-01 DIAGNOSIS — J690 Pneumonitis due to inhalation of food and vomit: Secondary | ICD-10-CM | POA: Diagnosis not present

## 2022-03-01 DIAGNOSIS — K760 Fatty (change of) liver, not elsewhere classified: Secondary | ICD-10-CM | POA: Diagnosis not present

## 2022-03-01 DIAGNOSIS — K7581 Nonalcoholic steatohepatitis (NASH): Secondary | ICD-10-CM | POA: Diagnosis not present

## 2022-03-01 DIAGNOSIS — E785 Hyperlipidemia, unspecified: Secondary | ICD-10-CM | POA: Diagnosis not present

## 2022-03-01 DIAGNOSIS — Z7951 Long term (current) use of inhaled steroids: Secondary | ICD-10-CM | POA: Diagnosis not present

## 2022-03-01 DIAGNOSIS — S32028D Other fracture of second lumbar vertebra, subsequent encounter for fracture with routine healing: Secondary | ICD-10-CM | POA: Diagnosis not present

## 2022-03-01 DIAGNOSIS — I10 Essential (primary) hypertension: Secondary | ICD-10-CM | POA: Diagnosis not present

## 2022-03-01 DIAGNOSIS — Z9181 History of falling: Secondary | ICD-10-CM | POA: Diagnosis not present

## 2022-03-01 DIAGNOSIS — F419 Anxiety disorder, unspecified: Secondary | ICD-10-CM | POA: Diagnosis not present

## 2022-03-01 DIAGNOSIS — K219 Gastro-esophageal reflux disease without esophagitis: Secondary | ICD-10-CM | POA: Diagnosis not present

## 2022-03-01 DIAGNOSIS — A419 Sepsis, unspecified organism: Secondary | ICD-10-CM | POA: Diagnosis not present

## 2022-03-01 DIAGNOSIS — K7460 Unspecified cirrhosis of liver: Secondary | ICD-10-CM | POA: Diagnosis not present

## 2022-03-01 DIAGNOSIS — K589 Irritable bowel syndrome without diarrhea: Secondary | ICD-10-CM | POA: Diagnosis not present

## 2022-03-01 DIAGNOSIS — E876 Hypokalemia: Secondary | ICD-10-CM | POA: Diagnosis not present

## 2022-03-01 DIAGNOSIS — K449 Diaphragmatic hernia without obstruction or gangrene: Secondary | ICD-10-CM | POA: Diagnosis not present

## 2022-03-01 DIAGNOSIS — Z791 Long term (current) use of non-steroidal anti-inflammatories (NSAID): Secondary | ICD-10-CM | POA: Diagnosis not present

## 2022-03-02 DIAGNOSIS — K449 Diaphragmatic hernia without obstruction or gangrene: Secondary | ICD-10-CM | POA: Diagnosis not present

## 2022-03-02 DIAGNOSIS — S32028D Other fracture of second lumbar vertebra, subsequent encounter for fracture with routine healing: Secondary | ICD-10-CM | POA: Diagnosis not present

## 2022-03-02 DIAGNOSIS — E876 Hypokalemia: Secondary | ICD-10-CM | POA: Diagnosis not present

## 2022-03-02 DIAGNOSIS — F419 Anxiety disorder, unspecified: Secondary | ICD-10-CM | POA: Diagnosis not present

## 2022-03-02 DIAGNOSIS — Z7951 Long term (current) use of inhaled steroids: Secondary | ICD-10-CM | POA: Diagnosis not present

## 2022-03-02 DIAGNOSIS — Z791 Long term (current) use of non-steroidal anti-inflammatories (NSAID): Secondary | ICD-10-CM | POA: Diagnosis not present

## 2022-03-02 DIAGNOSIS — K219 Gastro-esophageal reflux disease without esophagitis: Secondary | ICD-10-CM | POA: Diagnosis not present

## 2022-03-02 DIAGNOSIS — K760 Fatty (change of) liver, not elsewhere classified: Secondary | ICD-10-CM | POA: Diagnosis not present

## 2022-03-02 DIAGNOSIS — Z9181 History of falling: Secondary | ICD-10-CM | POA: Diagnosis not present

## 2022-03-02 DIAGNOSIS — I10 Essential (primary) hypertension: Secondary | ICD-10-CM | POA: Diagnosis not present

## 2022-03-02 DIAGNOSIS — J690 Pneumonitis due to inhalation of food and vomit: Secondary | ICD-10-CM | POA: Diagnosis not present

## 2022-03-02 DIAGNOSIS — K7581 Nonalcoholic steatohepatitis (NASH): Secondary | ICD-10-CM | POA: Diagnosis not present

## 2022-03-02 DIAGNOSIS — Z7982 Long term (current) use of aspirin: Secondary | ICD-10-CM | POA: Diagnosis not present

## 2022-03-02 DIAGNOSIS — A419 Sepsis, unspecified organism: Secondary | ICD-10-CM | POA: Diagnosis not present

## 2022-03-02 DIAGNOSIS — K589 Irritable bowel syndrome without diarrhea: Secondary | ICD-10-CM | POA: Diagnosis not present

## 2022-03-02 DIAGNOSIS — E785 Hyperlipidemia, unspecified: Secondary | ICD-10-CM | POA: Diagnosis not present

## 2022-03-02 DIAGNOSIS — K7460 Unspecified cirrhosis of liver: Secondary | ICD-10-CM | POA: Diagnosis not present

## 2022-03-04 DIAGNOSIS — Z6827 Body mass index (BMI) 27.0-27.9, adult: Secondary | ICD-10-CM | POA: Diagnosis not present

## 2022-03-04 DIAGNOSIS — I700 Atherosclerosis of aorta: Secondary | ICD-10-CM | POA: Diagnosis not present

## 2022-03-04 DIAGNOSIS — I25118 Atherosclerotic heart disease of native coronary artery with other forms of angina pectoris: Secondary | ICD-10-CM | POA: Diagnosis not present

## 2022-03-04 DIAGNOSIS — I509 Heart failure, unspecified: Secondary | ICD-10-CM | POA: Diagnosis not present

## 2022-03-04 DIAGNOSIS — Z7951 Long term (current) use of inhaled steroids: Secondary | ICD-10-CM | POA: Diagnosis not present

## 2022-03-04 DIAGNOSIS — D692 Other nonthrombocytopenic purpura: Secondary | ICD-10-CM | POA: Diagnosis not present

## 2022-03-04 DIAGNOSIS — F330 Major depressive disorder, recurrent, mild: Secondary | ICD-10-CM | POA: Diagnosis not present

## 2022-03-04 DIAGNOSIS — J449 Chronic obstructive pulmonary disease, unspecified: Secondary | ICD-10-CM | POA: Diagnosis not present

## 2022-03-07 DIAGNOSIS — K589 Irritable bowel syndrome without diarrhea: Secondary | ICD-10-CM | POA: Diagnosis not present

## 2022-03-07 DIAGNOSIS — K7581 Nonalcoholic steatohepatitis (NASH): Secondary | ICD-10-CM | POA: Diagnosis not present

## 2022-03-07 DIAGNOSIS — Z7982 Long term (current) use of aspirin: Secondary | ICD-10-CM | POA: Diagnosis not present

## 2022-03-07 DIAGNOSIS — E876 Hypokalemia: Secondary | ICD-10-CM | POA: Diagnosis not present

## 2022-03-07 DIAGNOSIS — Z7951 Long term (current) use of inhaled steroids: Secondary | ICD-10-CM | POA: Diagnosis not present

## 2022-03-07 DIAGNOSIS — K219 Gastro-esophageal reflux disease without esophagitis: Secondary | ICD-10-CM | POA: Diagnosis not present

## 2022-03-07 DIAGNOSIS — K760 Fatty (change of) liver, not elsewhere classified: Secondary | ICD-10-CM | POA: Diagnosis not present

## 2022-03-07 DIAGNOSIS — K7460 Unspecified cirrhosis of liver: Secondary | ICD-10-CM | POA: Diagnosis not present

## 2022-03-07 DIAGNOSIS — J690 Pneumonitis due to inhalation of food and vomit: Secondary | ICD-10-CM | POA: Diagnosis not present

## 2022-03-07 DIAGNOSIS — K449 Diaphragmatic hernia without obstruction or gangrene: Secondary | ICD-10-CM | POA: Diagnosis not present

## 2022-03-07 DIAGNOSIS — Z791 Long term (current) use of non-steroidal anti-inflammatories (NSAID): Secondary | ICD-10-CM | POA: Diagnosis not present

## 2022-03-07 DIAGNOSIS — F419 Anxiety disorder, unspecified: Secondary | ICD-10-CM | POA: Diagnosis not present

## 2022-03-07 DIAGNOSIS — Z9181 History of falling: Secondary | ICD-10-CM | POA: Diagnosis not present

## 2022-03-07 DIAGNOSIS — E785 Hyperlipidemia, unspecified: Secondary | ICD-10-CM | POA: Diagnosis not present

## 2022-03-07 DIAGNOSIS — S32028D Other fracture of second lumbar vertebra, subsequent encounter for fracture with routine healing: Secondary | ICD-10-CM | POA: Diagnosis not present

## 2022-03-07 DIAGNOSIS — A419 Sepsis, unspecified organism: Secondary | ICD-10-CM | POA: Diagnosis not present

## 2022-03-07 DIAGNOSIS — I10 Essential (primary) hypertension: Secondary | ICD-10-CM | POA: Diagnosis not present

## 2022-03-10 DIAGNOSIS — K588 Other irritable bowel syndrome: Secondary | ICD-10-CM | POA: Diagnosis not present

## 2022-03-10 DIAGNOSIS — K219 Gastro-esophageal reflux disease without esophagitis: Secondary | ICD-10-CM | POA: Diagnosis not present

## 2022-03-10 DIAGNOSIS — K760 Fatty (change of) liver, not elsewhere classified: Secondary | ICD-10-CM | POA: Diagnosis not present

## 2022-03-10 DIAGNOSIS — I10 Essential (primary) hypertension: Secondary | ICD-10-CM | POA: Diagnosis not present

## 2022-03-14 DIAGNOSIS — K7460 Unspecified cirrhosis of liver: Secondary | ICD-10-CM | POA: Diagnosis not present

## 2022-03-14 DIAGNOSIS — K760 Fatty (change of) liver, not elsewhere classified: Secondary | ICD-10-CM | POA: Diagnosis not present

## 2022-03-14 DIAGNOSIS — Z791 Long term (current) use of non-steroidal anti-inflammatories (NSAID): Secondary | ICD-10-CM | POA: Diagnosis not present

## 2022-03-14 DIAGNOSIS — F419 Anxiety disorder, unspecified: Secondary | ICD-10-CM | POA: Diagnosis not present

## 2022-03-14 DIAGNOSIS — K589 Irritable bowel syndrome without diarrhea: Secondary | ICD-10-CM | POA: Diagnosis not present

## 2022-03-14 DIAGNOSIS — Z7982 Long term (current) use of aspirin: Secondary | ICD-10-CM | POA: Diagnosis not present

## 2022-03-14 DIAGNOSIS — Z9181 History of falling: Secondary | ICD-10-CM | POA: Diagnosis not present

## 2022-03-14 DIAGNOSIS — S32028D Other fracture of second lumbar vertebra, subsequent encounter for fracture with routine healing: Secondary | ICD-10-CM | POA: Diagnosis not present

## 2022-03-14 DIAGNOSIS — E785 Hyperlipidemia, unspecified: Secondary | ICD-10-CM | POA: Diagnosis not present

## 2022-03-14 DIAGNOSIS — A419 Sepsis, unspecified organism: Secondary | ICD-10-CM | POA: Diagnosis not present

## 2022-03-14 DIAGNOSIS — K219 Gastro-esophageal reflux disease without esophagitis: Secondary | ICD-10-CM | POA: Diagnosis not present

## 2022-03-14 DIAGNOSIS — K449 Diaphragmatic hernia without obstruction or gangrene: Secondary | ICD-10-CM | POA: Diagnosis not present

## 2022-03-14 DIAGNOSIS — J690 Pneumonitis due to inhalation of food and vomit: Secondary | ICD-10-CM | POA: Diagnosis not present

## 2022-03-14 DIAGNOSIS — E876 Hypokalemia: Secondary | ICD-10-CM | POA: Diagnosis not present

## 2022-03-14 DIAGNOSIS — K7581 Nonalcoholic steatohepatitis (NASH): Secondary | ICD-10-CM | POA: Diagnosis not present

## 2022-03-14 DIAGNOSIS — Z7951 Long term (current) use of inhaled steroids: Secondary | ICD-10-CM | POA: Diagnosis not present

## 2022-03-14 DIAGNOSIS — I10 Essential (primary) hypertension: Secondary | ICD-10-CM | POA: Diagnosis not present

## 2022-03-22 DIAGNOSIS — I10 Essential (primary) hypertension: Secondary | ICD-10-CM | POA: Diagnosis not present

## 2022-03-22 DIAGNOSIS — Z299 Encounter for prophylactic measures, unspecified: Secondary | ICD-10-CM | POA: Diagnosis not present

## 2022-03-22 DIAGNOSIS — S22029A Unspecified fracture of second thoracic vertebra, initial encounter for closed fracture: Secondary | ICD-10-CM | POA: Diagnosis not present

## 2022-03-22 DIAGNOSIS — F321 Major depressive disorder, single episode, moderate: Secondary | ICD-10-CM | POA: Diagnosis not present

## 2022-03-22 DIAGNOSIS — D692 Other nonthrombocytopenic purpura: Secondary | ICD-10-CM | POA: Diagnosis not present

## 2022-03-22 DIAGNOSIS — Z6830 Body mass index (BMI) 30.0-30.9, adult: Secondary | ICD-10-CM | POA: Diagnosis not present

## 2022-03-23 DIAGNOSIS — I10 Essential (primary) hypertension: Secondary | ICD-10-CM | POA: Diagnosis not present

## 2022-03-23 DIAGNOSIS — E876 Hypokalemia: Secondary | ICD-10-CM | POA: Diagnosis not present

## 2022-03-23 DIAGNOSIS — K7581 Nonalcoholic steatohepatitis (NASH): Secondary | ICD-10-CM | POA: Diagnosis not present

## 2022-03-23 DIAGNOSIS — F419 Anxiety disorder, unspecified: Secondary | ICD-10-CM | POA: Diagnosis not present

## 2022-03-23 DIAGNOSIS — Z7982 Long term (current) use of aspirin: Secondary | ICD-10-CM | POA: Diagnosis not present

## 2022-03-23 DIAGNOSIS — K760 Fatty (change of) liver, not elsewhere classified: Secondary | ICD-10-CM | POA: Diagnosis not present

## 2022-03-23 DIAGNOSIS — S32028D Other fracture of second lumbar vertebra, subsequent encounter for fracture with routine healing: Secondary | ICD-10-CM | POA: Diagnosis not present

## 2022-03-23 DIAGNOSIS — J690 Pneumonitis due to inhalation of food and vomit: Secondary | ICD-10-CM | POA: Diagnosis not present

## 2022-03-23 DIAGNOSIS — K7460 Unspecified cirrhosis of liver: Secondary | ICD-10-CM | POA: Diagnosis not present

## 2022-03-23 DIAGNOSIS — K219 Gastro-esophageal reflux disease without esophagitis: Secondary | ICD-10-CM | POA: Diagnosis not present

## 2022-03-23 DIAGNOSIS — E785 Hyperlipidemia, unspecified: Secondary | ICD-10-CM | POA: Diagnosis not present

## 2022-03-23 DIAGNOSIS — A419 Sepsis, unspecified organism: Secondary | ICD-10-CM | POA: Diagnosis not present

## 2022-03-23 DIAGNOSIS — K589 Irritable bowel syndrome without diarrhea: Secondary | ICD-10-CM | POA: Diagnosis not present

## 2022-03-23 DIAGNOSIS — Z7951 Long term (current) use of inhaled steroids: Secondary | ICD-10-CM | POA: Diagnosis not present

## 2022-03-23 DIAGNOSIS — K449 Diaphragmatic hernia without obstruction or gangrene: Secondary | ICD-10-CM | POA: Diagnosis not present

## 2022-03-23 DIAGNOSIS — Z9181 History of falling: Secondary | ICD-10-CM | POA: Diagnosis not present

## 2022-03-23 DIAGNOSIS — Z791 Long term (current) use of non-steroidal anti-inflammatories (NSAID): Secondary | ICD-10-CM | POA: Diagnosis not present

## 2022-03-25 ENCOUNTER — Other Ambulatory Visit: Payer: Self-pay | Admitting: Physician Assistant

## 2022-03-25 DIAGNOSIS — K589 Irritable bowel syndrome without diarrhea: Secondary | ICD-10-CM | POA: Diagnosis not present

## 2022-03-25 DIAGNOSIS — K7581 Nonalcoholic steatohepatitis (NASH): Secondary | ICD-10-CM | POA: Diagnosis not present

## 2022-03-25 DIAGNOSIS — Z791 Long term (current) use of non-steroidal anti-inflammatories (NSAID): Secondary | ICD-10-CM | POA: Diagnosis not present

## 2022-03-25 DIAGNOSIS — E876 Hypokalemia: Secondary | ICD-10-CM | POA: Diagnosis not present

## 2022-03-25 DIAGNOSIS — K7460 Unspecified cirrhosis of liver: Secondary | ICD-10-CM | POA: Diagnosis not present

## 2022-03-25 DIAGNOSIS — A419 Sepsis, unspecified organism: Secondary | ICD-10-CM | POA: Diagnosis not present

## 2022-03-25 DIAGNOSIS — K449 Diaphragmatic hernia without obstruction or gangrene: Secondary | ICD-10-CM | POA: Diagnosis not present

## 2022-03-25 DIAGNOSIS — G8929 Other chronic pain: Secondary | ICD-10-CM

## 2022-03-25 DIAGNOSIS — I10 Essential (primary) hypertension: Secondary | ICD-10-CM | POA: Diagnosis not present

## 2022-03-25 DIAGNOSIS — K219 Gastro-esophageal reflux disease without esophagitis: Secondary | ICD-10-CM | POA: Diagnosis not present

## 2022-03-25 DIAGNOSIS — J690 Pneumonitis due to inhalation of food and vomit: Secondary | ICD-10-CM | POA: Diagnosis not present

## 2022-03-25 DIAGNOSIS — Z7951 Long term (current) use of inhaled steroids: Secondary | ICD-10-CM | POA: Diagnosis not present

## 2022-03-25 DIAGNOSIS — S32028D Other fracture of second lumbar vertebra, subsequent encounter for fracture with routine healing: Secondary | ICD-10-CM | POA: Diagnosis not present

## 2022-03-25 DIAGNOSIS — K760 Fatty (change of) liver, not elsewhere classified: Secondary | ICD-10-CM | POA: Diagnosis not present

## 2022-03-25 DIAGNOSIS — E785 Hyperlipidemia, unspecified: Secondary | ICD-10-CM | POA: Diagnosis not present

## 2022-03-25 DIAGNOSIS — F419 Anxiety disorder, unspecified: Secondary | ICD-10-CM | POA: Diagnosis not present

## 2022-03-25 DIAGNOSIS — Z9181 History of falling: Secondary | ICD-10-CM | POA: Diagnosis not present

## 2022-03-25 DIAGNOSIS — Z7982 Long term (current) use of aspirin: Secondary | ICD-10-CM | POA: Diagnosis not present

## 2022-03-25 NOTE — Progress Notes (Signed)
ESI ordered. 

## 2022-03-28 DIAGNOSIS — N390 Urinary tract infection, site not specified: Secondary | ICD-10-CM | POA: Diagnosis not present

## 2022-03-30 ENCOUNTER — Emergency Department (HOSPITAL_COMMUNITY)
Admission: EM | Admit: 2022-03-30 | Discharge: 2022-03-30 | Disposition: A | Discharge status: Home / Self Care | Payer: PPO | Attending: Emergency Medicine | Admitting: Emergency Medicine

## 2022-03-30 ENCOUNTER — Other Ambulatory Visit: Payer: Self-pay

## 2022-03-30 ENCOUNTER — Emergency Department (HOSPITAL_COMMUNITY): Payer: PPO

## 2022-03-30 ENCOUNTER — Encounter (HOSPITAL_COMMUNITY): Payer: Self-pay | Admitting: Emergency Medicine

## 2022-03-30 DIAGNOSIS — K6289 Other specified diseases of anus and rectum: Secondary | ICD-10-CM | POA: Diagnosis not present

## 2022-03-30 DIAGNOSIS — K5730 Diverticulosis of large intestine without perforation or abscess without bleeding: Secondary | ICD-10-CM | POA: Diagnosis not present

## 2022-03-30 DIAGNOSIS — R52 Pain, unspecified: Secondary | ICD-10-CM | POA: Diagnosis not present

## 2022-03-30 DIAGNOSIS — K5900 Constipation, unspecified: Secondary | ICD-10-CM | POA: Insufficient documentation

## 2022-03-30 DIAGNOSIS — K449 Diaphragmatic hernia without obstruction or gangrene: Secondary | ICD-10-CM | POA: Diagnosis not present

## 2022-03-30 DIAGNOSIS — K219 Gastro-esophageal reflux disease without esophagitis: Secondary | ICD-10-CM | POA: Insufficient documentation

## 2022-03-30 DIAGNOSIS — Z7982 Long term (current) use of aspirin: Secondary | ICD-10-CM | POA: Diagnosis not present

## 2022-03-30 DIAGNOSIS — I10 Essential (primary) hypertension: Secondary | ICD-10-CM | POA: Insufficient documentation

## 2022-03-30 DIAGNOSIS — Z79899 Other long term (current) drug therapy: Secondary | ICD-10-CM | POA: Diagnosis not present

## 2022-03-30 DIAGNOSIS — R109 Unspecified abdominal pain: Secondary | ICD-10-CM | POA: Insufficient documentation

## 2022-03-30 DIAGNOSIS — K8689 Other specified diseases of pancreas: Secondary | ICD-10-CM | POA: Diagnosis not present

## 2022-03-30 NOTE — ED Notes (Signed)
Placed new brief on pt after EDP exam

## 2022-03-30 NOTE — ED Provider Notes (Signed)
[facility] Provider Note   CSN: [id number] Arrival date & time: 03/30/22  5188     History  Chief Complaint  Patient presents with   Constipation    Heather [name] is a 88 y.o. female.  The history is provided by the patient.  Constipation She has history of hypertension, GERD, cirrhosis and comes in complaining of rectal pain and constipation for the last 3 days.  She states she has not had a bowel movement during that time.  She does have chronic problems with constipation and takes lactulose and milk of magnesia as needed she also has had multiple falls in the recent past and is complaining of pain in her legs from falls 2 weeks ago.  She denies any nausea or vomiting.   Home Medications Prior to Admission medications   Medication Sig Start Date End Date Taking? Authorizing Provider  acetaminophen (TYLENOL) 500 MG tablet Take 500 mg by mouth every 4 (four) hours as needed for mild pain, moderate pain or fever.    [provider]  albuterol (VENTOLIN HFA) 108 (90 Base) MCG/ACT inhaler Inhale 2 puffs into the lungs every 4 (four) hours as needed for wheezing or shortness of breath. 01/28/22   Roxan Hockey, MD  ALPRAZolam Duanne Moron) 1 MG tablet Take 1 tablet (1 mg total) by mouth 3 (three) times daily. 01/28/22   Roxan Hockey, MD  alum & mag hydroxide-simeth (MAALOX/MYLANTA) 200-200-20 MG/5ML suspension Take 30 mLs by mouth every 6 (six) hours as needed for indigestion or heartburn.    [provider]  amLODipine (NORVASC) 10 MG tablet Take 10 mg by mouth every morning.  07/14/17   [provider]  aspirin 81 MG tablet Take 1 tablet (81 mg total) by mouth daily with breakfast. 01/28/22   Emokpae, Courage, MD  Calcium-Magnesium-Zinc (CAL-MAG-ZINC PO) Take 1 tablet by mouth daily.    [provider]  Diclofenac Sodium 3 % GEL Apply 1-2 g topically in the morning, at noon, and at bedtime.    [provider]  dicyclomine  (BENTYL) 10 MG capsule TAKE ONE CAPSULE BY MOUTH FOUR TIMES DAILY BEFORE MEALS and AT BEDTIME Patient taking differently: Take 10 mg by mouth daily at 6 (six) AM. 12/21/21   Montez Morita, Quillian Quince, MD  ferrous sulfate 325 (65 FE) MG tablet Take 325 mg by mouth daily with breakfast.    [provider]  fluticasone furoate-vilanterol (BREO ELLIPTA) 100-25 MCG/ACT AEPB Inhale 1 puff into the lungs daily. 03/04/20   [provider]  furosemide (LASIX) 40 MG tablet Take 40 mg by mouth daily.    [provider]  gabapentin (NEURONTIN) 300 MG capsule Take 300 mg by mouth daily. 01/17/22   [provider]  HYDROcodone-acetaminophen (NORCO/VICODIN) 5-325 MG tablet SMARTSIG:1 Tablet(s) By Mouth Every 12 Hours PRN 02/17/22   [provider]  isosorbide mononitrate (IMDUR) 60 MG 24 hr tablet Take 60 mg by mouth daily. 01/17/22   [provider]  lactulose (CHRONULAC) 10 GM/15ML solution Take 30 mLs (20 g total) by mouth 2 (two) times daily. Goal is to Have at least 2 mushy/Non-solid bowel movements Each day-- May give 1 additional dose of lactulose if no bowel movement in 24 hr period 01/28/22   Roxan Hockey, MD  metoprolol tartrate (LOPRESSOR) 25 MG tablet Take 25 mg by mouth 2 (two) times daily. 07/10/18   [provider]  MILK OF MAGNESIA 400 MG/5ML suspension SMARTSIG:30 Milliliter(s) By Mouth Every Night PRN 02/17/22  [provider]  Naphazoline-Pheniramine (OPCON-A) 0.027-0.315 % SOLN Place 1 drop into both eyes 2 (two) times daily as needed (dry eyes).    [provider]  nitroGLYCERIN (NITROSTAT) 0.4 MG SL tablet Place 0.4 mg under the tongue every 5 (five) minutes as needed for chest pain.    [provider]  omeprazole (PRILOSEC) 40 MG capsule Take 40 mg by mouth daily.    [provider]  potassium chloride (KLOR-CON) 10 MEQ tablet Take 1 tablet (10 mEq total) by mouth daily. Take While taking Lactulose  01/28/22   Emokpae, Courage, MD  sucralfate (CARAFATE) 1 g tablet Take 1 g by mouth 4 (four) times daily. 01/17/22   [provider]  traMADol (ULTRAM) 50 MG tablet Take 50 mg by mouth 2 (two) times daily as needed. 03/04/22   [provider]      Allergies    Patient has no known allergies.    Review of Systems   Review of Systems  Gastrointestinal:  Positive for constipation.  All other systems reviewed and are negative.  Physical Exam Updated Vital Signs BP (!) 159/65 (BP Location: Right Arm)   Pulse 77   Temp 98.4 F (36.9 C) (Oral)   Resp (!) 21   Ht '5\' 7"'$  (1.702 m)   Wt 81.6 kg   SpO2 92%   BMI 28.19 kg/m  Physical Exam Vitals and nursing note reviewed. Exam conducted with a chaperone present.  88 year old female, resting comfortably and in no acute distress. Vital signs are significant for elevated blood pressure. Oxygen saturation is 92%, which is normal. Head is normocephalic and atraumatic. PERRLA, EOMI. Oropharynx is clear. Neck is nontender and supple without adenopathy or JVD. Back is nontender and there is no CVA tenderness. Lungs are clear without rales, wheezes, or rhonchi. Chest is nontender. Heart has regular rate and rhythm without murmur. Abdomen is soft, flat, nontender. Rectal: External skin tags present.  Normal sphincter tone.  No fecal impaction.  Small amount of liquid light brown stool present. Extremities have no cyanosis or edema. Skin is warm and dry without rash. Neurologic: Awake and alert, cranial nerves are intact, moves all extremities equally.  ED Results / Procedures / Treatments    Radiology CT Renal Stone Study  Result Date: 03/30/2022 CLINICAL DATA:  Flank pain with kidney stone suspected. Constipation for 3 days EXAM: CT ABDOMEN AND PELVIS WITHOUT CONTRAST TECHNIQUE: Multidetector CT imaging of the abdomen and pelvis was performed following the standard protocol without IV contrast. RADIATION DOSE REDUCTION: This exam  was performed according to the departmental dose-optimization program which includes automated exposure control, adjustment of the mA and/or kV according to patient size and/or use of iterative reconstruction technique. COMPARISON:  01/27/2022 FINDINGS: Lower chest:  Small sliding hiatal hernia Hepatobiliary: Prominent liver fissures and caudate lobe size but no definite surface lobulation for cirrhosis.No evidence of biliary obstruction or stone. Pancreas: Generalized atrophy Spleen: Unremarkable. Adrenals/Urinary Tract: Negative adrenals. No hydronephrosis or stone. Unremarkable bladder. Stomach/Bowel: No obstruction. Left colonic diverticulosis. No visible bowel inflammation. Vascular/Lymphatic: Atheromatous calcification of the aorta and iliacs. No mass or adenopathy. Reproductive:No pathologic findings. 13 mm simple appearing right ovarian cyst. No follow-up imaging recommended. Note: This recommendation does not apply to premenarchal patients and to those with increased risk (genetic, family history, elevated tumor markers or other high-risk factors) of ovarian cancer. Reference: JACR 2020 Feb; 17(2):248-254 Other: No ascites or pneumoperitoneum. Homogeneous nodule along the posterior aspect of the right diaphragm, benign  given multi year stability Musculoskeletal: L2 compression fracture with inferior endplate depression and sclerosis. Fracture lines are still readily apparent and there is mild paravertebral edema. Some callus is likely present and there is vacuum phenomenon in the adjacent disc space. Remote T12 compression fracture with cement augmentation. Generalized osteopenia and lumbar spine degeneration with mild L4-5 anterolisthesis. Sizable hemangioma at the L4 body. IMPRESSION: 1. Subacute/un-healed L2 compression fracture with mild height loss. 2. No acute intra-abdominal finding. 3. Atherosclerosis and colonic diverticulosis. Electronically Signed   By: Jorje Guild M.D.   On: 03/30/2022 05:27     Procedures Procedures    Medications Ordered in ED Medications - No data to display  ED Course/ Medical Decision Making/ A&P                           Medical Decision Making Amount and/or Complexity of Data Reviewed Radiology: ordered.   Rectal pain with complaints of constipation but no evidence of fecal impaction.  Will send for CT scan to assess stool burden.  She does apparently have history of bowel obstruction, but I doubt if bowel obstruction is present.  Old records are reviewed, and per office visit on 11/03/2021, she is followed for IBS-C.  Last CT of abdomen and pelvis was on 01/27/2022 at which time there was no evidence of bowel obstruction, diverticulosis was present.  CT scan shows no acute intra-abdominal process, no significant stool burden.  I have independently viewed the images, and agree with radiologist interpretation.  Patient's daughter has arrived and states that actually, patient had a bowel movement just prior to EMS arriving.  Apparently, the lactulose and milk of magnesia had taken effect.  Daughter is concerned because patient had a urinalysis 2 days ago which showed UTI and specimen was sent for culture and she had not been started on antibiotics.  She is advised to follow-up with the physician who had ordered the urine culture.  Final Clinical Impression(s) / ED Diagnoses Final diagnoses:  Proctalgia    Rx / DC Orders ED Discharge Orders     None         Delora Fuel, MD 16/01/09 [phone]

## 2022-03-30 NOTE — Discharge Instructions (Addendum)
Continue your current medications for constipation.   You can safely increase your dose of Miralax, if needed.

## 2022-03-30 NOTE — ED Notes (Signed)
ED Provider at bedside. 

## 2022-03-30 NOTE — ED Notes (Signed)
Patient transported to CT 

## 2022-03-30 NOTE — ED Triage Notes (Addendum)
To ed via [facility] from the [facility]. C/o constipation x3 days. Chronic back/leg pain. Hx of bowel obstruction Taken milk of mag and lactulose @ 0115. Takes every day

## 2022-03-31 ENCOUNTER — Telehealth: Payer: Self-pay | Admitting: Physician Assistant

## 2022-03-31 NOTE — Telephone Encounter (Signed)
Pt called and asked for the injection to be sent to [facility]

## 2022-04-01 ENCOUNTER — Other Ambulatory Visit: Payer: Self-pay | Admitting: Physician Assistant

## 2022-04-01 DIAGNOSIS — M545 Low back pain, unspecified: Secondary | ICD-10-CM

## 2022-04-01 NOTE — Telephone Encounter (Signed)
I sw Cathy with [facility] and she is going to call pt to schedule

## 2022-04-05 DIAGNOSIS — F419 Anxiety disorder, unspecified: Secondary | ICD-10-CM | POA: Diagnosis not present

## 2022-04-05 DIAGNOSIS — Z299 Encounter for prophylactic measures, unspecified: Secondary | ICD-10-CM | POA: Diagnosis not present

## 2022-04-05 DIAGNOSIS — K5900 Constipation, unspecified: Secondary | ICD-10-CM | POA: Diagnosis not present

## 2022-04-05 DIAGNOSIS — I10 Essential (primary) hypertension: Secondary | ICD-10-CM | POA: Diagnosis not present

## 2022-04-06 ENCOUNTER — Ambulatory Visit
Admission: RE | Admit: 2022-04-06 | Discharge: 2022-04-06 | Disposition: A | Discharge status: Home / Self Care | Payer: PPO | Source: Ambulatory Visit | Attending: Physician Assistant | Admitting: Physician Assistant

## 2022-04-06 DIAGNOSIS — G8929 Other chronic pain: Secondary | ICD-10-CM

## 2022-04-06 DIAGNOSIS — M48061 Spinal stenosis, lumbar region without neurogenic claudication: Secondary | ICD-10-CM | POA: Diagnosis not present

## 2022-04-06 DIAGNOSIS — M47817 Spondylosis without myelopathy or radiculopathy, lumbosacral region: Secondary | ICD-10-CM | POA: Diagnosis not present

## 2022-04-06 MED ORDER — METHYLPREDNISOLONE ACETATE 40 MG/ML INJ SUSP (RADIOLOG
80.0000 mg | Freq: Once | INTRAMUSCULAR | Status: AC
  Administered 2022-04-06: 80 mg via EPIDURAL

## 2022-04-06 MED ORDER — IOPAMIDOL (ISOVUE-M 200) INJECTION 41%
1.0000 mL | Freq: Once | INTRAMUSCULAR | Status: AC
  Administered 2022-04-06: 1 mL via EPIDURAL

## 2022-04-06 NOTE — Discharge Instructions (Signed)

## 2022-04-07 DIAGNOSIS — I10 Essential (primary) hypertension: Secondary | ICD-10-CM | POA: Diagnosis not present

## 2022-04-07 DIAGNOSIS — R350 Frequency of micturition: Secondary | ICD-10-CM | POA: Diagnosis not present

## 2022-04-07 DIAGNOSIS — Z789 Other specified health status: Secondary | ICD-10-CM | POA: Diagnosis not present

## 2022-04-07 DIAGNOSIS — Z299 Encounter for prophylactic measures, unspecified: Secondary | ICD-10-CM | POA: Diagnosis not present

## 2022-04-15 DIAGNOSIS — K760 Fatty (change of) liver, not elsewhere classified: Secondary | ICD-10-CM | POA: Diagnosis not present

## 2022-04-15 DIAGNOSIS — F419 Anxiety disorder, unspecified: Secondary | ICD-10-CM | POA: Diagnosis not present

## 2022-04-22 DIAGNOSIS — S32029A Unspecified fracture of second lumbar vertebra, initial encounter for closed fracture: Secondary | ICD-10-CM | POA: Diagnosis not present

## 2022-04-22 DIAGNOSIS — I10 Essential (primary) hypertension: Secondary | ICD-10-CM | POA: Diagnosis not present

## 2022-04-22 DIAGNOSIS — K7689 Other specified diseases of liver: Secondary | ICD-10-CM | POA: Diagnosis not present

## 2022-04-22 DIAGNOSIS — M545 Low back pain, unspecified: Secondary | ICD-10-CM | POA: Diagnosis not present

## 2022-04-22 DIAGNOSIS — N281 Cyst of kidney, acquired: Secondary | ICD-10-CM | POA: Diagnosis not present

## 2022-04-22 DIAGNOSIS — E785 Hyperlipidemia, unspecified: Secondary | ICD-10-CM | POA: Diagnosis not present

## 2022-04-22 DIAGNOSIS — K4090 Unilateral inguinal hernia, without obstruction or gangrene, not specified as recurrent: Secondary | ICD-10-CM | POA: Diagnosis not present

## 2022-04-22 DIAGNOSIS — R1030 Lower abdominal pain, unspecified: Secondary | ICD-10-CM | POA: Diagnosis not present

## 2022-04-22 DIAGNOSIS — G8929 Other chronic pain: Secondary | ICD-10-CM | POA: Diagnosis not present

## 2022-04-22 DIAGNOSIS — K4020 Bilateral inguinal hernia, without obstruction or gangrene, not specified as recurrent: Secondary | ICD-10-CM | POA: Diagnosis not present

## 2022-04-22 DIAGNOSIS — X58XXXA Exposure to other specified factors, initial encounter: Secondary | ICD-10-CM | POA: Diagnosis not present

## 2022-04-22 DIAGNOSIS — S32000A Wedge compression fracture of unspecified lumbar vertebra, initial encounter for closed fracture: Secondary | ICD-10-CM | POA: Diagnosis not present

## 2022-04-22 DIAGNOSIS — R350 Frequency of micturition: Secondary | ICD-10-CM | POA: Diagnosis not present

## 2022-04-25 ENCOUNTER — Inpatient Hospital Stay (HOSPITAL_COMMUNITY)
Admission: EM | Admit: 2022-04-25 | Discharge: 2022-04-29 | DRG: 542 | Disposition: A | Discharge status: Skilled Nursing Facility | Payer: PPO | Attending: Internal Medicine | Admitting: Internal Medicine

## 2022-04-25 ENCOUNTER — Other Ambulatory Visit: Payer: Self-pay

## 2022-04-25 ENCOUNTER — Emergency Department (HOSPITAL_COMMUNITY): Payer: PPO

## 2022-04-25 ENCOUNTER — Encounter (HOSPITAL_COMMUNITY): Payer: Self-pay

## 2022-04-25 DIAGNOSIS — Z9181 History of falling: Secondary | ICD-10-CM | POA: Diagnosis not present

## 2022-04-25 DIAGNOSIS — K7581 Nonalcoholic steatohepatitis (NASH): Secondary | ICD-10-CM | POA: Diagnosis present

## 2022-04-25 DIAGNOSIS — K5900 Constipation, unspecified: Secondary | ICD-10-CM | POA: Diagnosis not present

## 2022-04-25 DIAGNOSIS — K7460 Unspecified cirrhosis of liver: Secondary | ICD-10-CM | POA: Diagnosis present

## 2022-04-25 DIAGNOSIS — Z8249 Family history of ischemic heart disease and other diseases of the circulatory system: Secondary | ICD-10-CM

## 2022-04-25 DIAGNOSIS — K6389 Other specified diseases of intestine: Secondary | ICD-10-CM | POA: Diagnosis present

## 2022-04-25 DIAGNOSIS — J9811 Atelectasis: Secondary | ICD-10-CM | POA: Diagnosis present

## 2022-04-25 DIAGNOSIS — K5730 Diverticulosis of large intestine without perforation or abscess without bleeding: Secondary | ICD-10-CM | POA: Diagnosis not present

## 2022-04-25 DIAGNOSIS — R935 Abnormal findings on diagnostic imaging of other abdominal regions, including retroperitoneum: Secondary | ICD-10-CM

## 2022-04-25 DIAGNOSIS — R41841 Cognitive communication deficit: Secondary | ICD-10-CM | POA: Diagnosis not present

## 2022-04-25 DIAGNOSIS — R2681 Unsteadiness on feet: Secondary | ICD-10-CM | POA: Diagnosis not present

## 2022-04-25 DIAGNOSIS — F22 Delusional disorders: Secondary | ICD-10-CM | POA: Diagnosis not present

## 2022-04-25 DIAGNOSIS — F0394 Unspecified dementia, unspecified severity, with anxiety: Secondary | ICD-10-CM | POA: Diagnosis not present

## 2022-04-25 DIAGNOSIS — M549 Dorsalgia, unspecified: Secondary | ICD-10-CM | POA: Diagnosis present

## 2022-04-25 DIAGNOSIS — R911 Solitary pulmonary nodule: Secondary | ICD-10-CM | POA: Diagnosis not present

## 2022-04-25 DIAGNOSIS — R1033 Periumbilical pain: Secondary | ICD-10-CM | POA: Diagnosis not present

## 2022-04-25 DIAGNOSIS — L8990 Pressure ulcer of unspecified site, unspecified stage: Secondary | ICD-10-CM | POA: Insufficient documentation

## 2022-04-25 DIAGNOSIS — Z79899 Other long term (current) drug therapy: Secondary | ICD-10-CM

## 2022-04-25 DIAGNOSIS — K219 Gastro-esophageal reflux disease without esophagitis: Secondary | ICD-10-CM | POA: Diagnosis present

## 2022-04-25 DIAGNOSIS — M4854XA Collapsed vertebra, not elsewhere classified, thoracic region, initial encounter for fracture: Secondary | ICD-10-CM | POA: Diagnosis not present

## 2022-04-25 DIAGNOSIS — K921 Melena: Secondary | ICD-10-CM | POA: Diagnosis not present

## 2022-04-25 DIAGNOSIS — E785 Hyperlipidemia, unspecified: Secondary | ICD-10-CM | POA: Diagnosis present

## 2022-04-25 DIAGNOSIS — E041 Nontoxic single thyroid nodule: Secondary | ICD-10-CM | POA: Diagnosis not present

## 2022-04-25 DIAGNOSIS — Z7982 Long term (current) use of aspirin: Secondary | ICD-10-CM

## 2022-04-25 DIAGNOSIS — G8929 Other chronic pain: Secondary | ICD-10-CM | POA: Diagnosis not present

## 2022-04-25 DIAGNOSIS — R072 Precordial pain: Secondary | ICD-10-CM | POA: Diagnosis not present

## 2022-04-25 DIAGNOSIS — R001 Bradycardia, unspecified: Secondary | ICD-10-CM | POA: Diagnosis not present

## 2022-04-25 DIAGNOSIS — J9601 Acute respiratory failure with hypoxia: Secondary | ICD-10-CM | POA: Diagnosis not present

## 2022-04-25 DIAGNOSIS — R109 Unspecified abdominal pain: Secondary | ICD-10-CM | POA: Diagnosis not present

## 2022-04-25 DIAGNOSIS — K581 Irritable bowel syndrome with constipation: Secondary | ICD-10-CM | POA: Diagnosis present

## 2022-04-25 DIAGNOSIS — M6281 Muscle weakness (generalized): Secondary | ICD-10-CM | POA: Diagnosis not present

## 2022-04-25 DIAGNOSIS — R079 Chest pain, unspecified: Secondary | ICD-10-CM | POA: Diagnosis not present

## 2022-04-25 DIAGNOSIS — M4856XA Collapsed vertebra, not elsewhere classified, lumbar region, initial encounter for fracture: Principal | ICD-10-CM | POA: Diagnosis present

## 2022-04-25 DIAGNOSIS — S32010A Wedge compression fracture of first lumbar vertebra, initial encounter for closed fracture: Secondary | ICD-10-CM | POA: Diagnosis present

## 2022-04-25 DIAGNOSIS — M545 Low back pain, unspecified: Secondary | ICD-10-CM | POA: Diagnosis not present

## 2022-04-25 DIAGNOSIS — I10 Essential (primary) hypertension: Secondary | ICD-10-CM | POA: Diagnosis present

## 2022-04-25 DIAGNOSIS — R279 Unspecified lack of coordination: Secondary | ICD-10-CM | POA: Diagnosis not present

## 2022-04-25 DIAGNOSIS — Z743 Need for continuous supervision: Secondary | ICD-10-CM | POA: Diagnosis not present

## 2022-04-25 DIAGNOSIS — S32010D Wedge compression fracture of first lumbar vertebra, subsequent encounter for fracture with routine healing: Secondary | ICD-10-CM | POA: Diagnosis not present

## 2022-04-25 DIAGNOSIS — F0392 Unspecified dementia, unspecified severity, with psychotic disturbance: Secondary | ICD-10-CM | POA: Diagnosis not present

## 2022-04-25 DIAGNOSIS — F419 Anxiety disorder, unspecified: Secondary | ICD-10-CM | POA: Diagnosis not present

## 2022-04-25 DIAGNOSIS — N281 Cyst of kidney, acquired: Secondary | ICD-10-CM | POA: Diagnosis not present

## 2022-04-25 LAB — URINALYSIS, ROUTINE W REFLEX MICROSCOPIC
Bilirubin Urine: NEGATIVE
Glucose, UA: NEGATIVE mg/dL
Hgb urine dipstick: NEGATIVE
Ketones, ur: NEGATIVE mg/dL
Leukocytes,Ua: NEGATIVE
Nitrite: NEGATIVE
Protein, ur: NEGATIVE mg/dL
Specific Gravity, Urine: 1.017 (ref 1.005–1.030)
pH: 7 (ref 5.0–8.0)

## 2022-04-25 LAB — COMPREHENSIVE METABOLIC PANEL
ALT: 21 U/L (ref 0–44)
AST: 22 U/L (ref 15–41)
Albumin: 4 g/dL (ref 3.5–5.0)
Alkaline Phosphatase: 94 U/L (ref 38–126)
Anion gap: 9 (ref 5–15)
BUN: 19 mg/dL (ref 8–23)
CO2: 28 mmol/L (ref 22–32)
Calcium: 9.5 mg/dL (ref 8.9–10.3)
Chloride: 100 mmol/L (ref 98–111)
Creatinine, Ser: 0.82 mg/dL (ref 0.44–1.00)
GFR, Estimated: >60 mL/min (ref >60)
Glucose, Bld: 100 mg/dL — ABNORMAL HIGH (ref 70–99)
Potassium: 3.7 mmol/L (ref 3.5–5.1)
Sodium: 137 mmol/L (ref 135–145)
Total Bilirubin: 0.5 mg/dL (ref 0.3–1.2)
Total Protein: 8 g/dL (ref 6.5–8.1)

## 2022-04-25 LAB — CBC WITH DIFFERENTIAL/PLATELET
Abs Immature Granulocytes: 0.03 10*3/uL (ref 0.00–0.07)
Basophils Absolute: 0.1 10*3/uL (ref 0.0–0.1)
Basophils Relative: 1 %
Eosinophils Absolute: 0.3 10*3/uL (ref 0.0–0.5)
Eosinophils Relative: 3 %
HCT: 48.8 % — ABNORMAL HIGH (ref 36.0–46.0)
Hemoglobin: 16.4 g/dL — ABNORMAL HIGH (ref 12.0–15.0)
Immature Granulocytes: 0 %
Lymphocytes Relative: 26 %
Lymphs Abs: 2.7 10*3/uL (ref 0.7–4.0)
MCH: 29.7 pg (ref 26.0–34.0)
MCHC: 33.6 g/dL (ref 30.0–36.0)
MCV: 88.2 fL (ref 80.0–100.0)
Monocytes Absolute: 0.8 10*3/uL (ref 0.1–1.0)
Monocytes Relative: 7 %
Neutro Abs: 6.6 10*3/uL (ref 1.7–7.7)
Neutrophils Relative %: 63 %
Platelets: 208 10*3/uL (ref 150–400)
RBC: 5.53 MIL/uL — ABNORMAL HIGH (ref 3.87–5.11)
RDW: 12.6 % (ref 11.5–15.5)
WBC: 10.5 10*3/uL (ref 4.0–10.5)
nRBC: 0 % (ref 0.0–0.2)

## 2022-04-25 LAB — MAGNESIUM: Magnesium: 2.4 mg/dL (ref 1.7–2.4)

## 2022-04-25 LAB — TROPONIN I (HIGH SENSITIVITY)
Troponin I (High Sensitivity): 3 ng/L (ref <18)
Troponin I (High Sensitivity): 3 ng/L (ref <18)

## 2022-04-25 LAB — D-DIMER, QUANTITATIVE: D-Dimer, Quant: 1.87 ug/mL-FEU — ABNORMAL HIGH (ref 0.00–0.50)

## 2022-04-25 LAB — LIPASE, BLOOD: Lipase: 26 U/L (ref 11–51)

## 2022-04-25 MED ORDER — PANTOPRAZOLE SODIUM 40 MG PO TBEC
80.0000 mg | DELAYED_RELEASE_TABLET | Freq: Every day | ORAL | Status: DC
  Administered 2022-04-25 – 2022-04-29 (×5): 80 mg via ORAL
  Filled 2022-04-25 (×5): qty 2

## 2022-04-25 MED ORDER — ACETAMINOPHEN 500 MG PO TABS
500.0000 mg | ORAL_TABLET | ORAL | Status: DC | PRN
Start: 2022-04-25 — End: 2022-04-25

## 2022-04-25 MED ORDER — ONDANSETRON HCL 4 MG PO TABS: 4.0000 mg | ORAL_TABLET | Freq: Four times a day (QID) | ORAL | Status: DC | PRN

## 2022-04-25 MED ORDER — GABAPENTIN 300 MG PO CAPS
300.0000 mg | ORAL_CAPSULE | Freq: Every day | ORAL | Status: DC
  Administered 2022-04-25 – 2022-04-29 (×5): 300 mg via ORAL
  Filled 2022-04-25 (×5): qty 1

## 2022-04-25 MED ORDER — ONDANSETRON HCL 4 MG/2ML IJ SOLN: 4.0000 mg | Freq: Four times a day (QID) | INTRAMUSCULAR | Status: DC | PRN

## 2022-04-25 MED ORDER — SORBITOL 70 % SOLN
960.0000 mL | TOPICAL_OIL | Freq: Once | ORAL | Status: AC
  Administered 2022-04-26: 960 mL via RECTAL
  Filled 2022-04-25: qty 473

## 2022-04-25 MED ORDER — AMLODIPINE BESYLATE 5 MG PO TABS
10.0000 mg | ORAL_TABLET | Freq: Every morning | ORAL | Status: DC
  Filled 2022-04-25: qty 2

## 2022-04-25 MED ORDER — ACETAMINOPHEN 325 MG PO TABS: 650.0000 mg | ORAL_TABLET | Freq: Four times a day (QID) | ORAL | Status: DC | PRN

## 2022-04-25 MED ORDER — ALBUTEROL SULFATE (2.5 MG/3ML) 0.083% IN NEBU: 2.5000 mg | INHALATION_SOLUTION | RESPIRATORY_TRACT | Status: DC | PRN

## 2022-04-25 MED ORDER — IOHEXOL 350 MG/ML SOLN
100.0000 mL | Freq: Once | INTRAVENOUS | Status: AC | PRN
  Administered 2022-04-25: 100 mL via INTRAVENOUS

## 2022-04-25 MED ORDER — ISOSORBIDE MONONITRATE ER 60 MG PO TB24
60.0000 mg | ORAL_TABLET | Freq: Every day | ORAL | Status: DC
  Administered 2022-04-25 – 2022-04-29 (×5): 60 mg via ORAL
  Filled 2022-04-25 (×5): qty 1

## 2022-04-25 MED ORDER — POLYETHYLENE GLYCOL 3350 17 G PO PACK
17.0000 g | PACK | Freq: Every day | ORAL | Status: DC
  Administered 2022-04-25 – 2022-04-29 (×5): 17 g via ORAL
  Filled 2022-04-25 (×5): qty 1

## 2022-04-25 MED ORDER — ALPRAZOLAM 1 MG PO TABS
1.0000 mg | ORAL_TABLET | Freq: Three times a day (TID) | ORAL | Status: DC
Start: 2022-04-25 — End: 2022-04-25

## 2022-04-25 MED ORDER — ENOXAPARIN SODIUM 40 MG/0.4ML IJ SOSY
40.0000 mg | PREFILLED_SYRINGE | INTRAMUSCULAR | Status: DC
Start: 2022-04-25 — End: 2022-04-27
  Administered 2022-04-25 – 2022-04-27 (×3): 40 mg via SUBCUTANEOUS
  Filled 2022-04-25 (×3): qty 0.4

## 2022-04-25 MED ORDER — FLUTICASONE FUROATE-VILANTEROL 100-25 MCG/ACT IN AEPB
1.0000 | INHALATION_SPRAY | Freq: Every day | RESPIRATORY_TRACT | Status: DC
  Administered 2022-04-26 – 2022-04-29 (×3): 1 via RESPIRATORY_TRACT
  Filled 2022-04-25: qty 28

## 2022-04-25 MED ORDER — GADOBUTROL 1 MMOL/ML IV SOLN
7.0000 mL | Freq: Once | INTRAVENOUS | Status: AC | PRN
Start: 2022-04-25 — End: 2022-04-25
  Administered 2022-04-25: 7 mL via INTRAVENOUS

## 2022-04-25 MED ORDER — KETOROLAC TROMETHAMINE 15 MG/ML IJ SOLN
15.0000 mg | Freq: Once | INTRAMUSCULAR | Status: AC
  Administered 2022-04-25: 15 mg via INTRAVENOUS
  Filled 2022-04-25: qty 1

## 2022-04-25 MED ORDER — LACTATED RINGERS IV SOLN
INTRAVENOUS | Status: DC
Start: 2022-04-25 — End: 2022-04-28

## 2022-04-25 MED ORDER — OXYCODONE-ACETAMINOPHEN 5-325 MG PO TABS
1.0000 | ORAL_TABLET | Freq: Once | ORAL | Status: AC
  Administered 2022-04-25: 1 via ORAL
  Filled 2022-04-25: qty 1

## 2022-04-25 MED ORDER — ASPIRIN 81 MG PO TBEC
81.0000 mg | DELAYED_RELEASE_TABLET | Freq: Every day | ORAL | Status: DC
  Administered 2022-04-25 – 2022-04-27 (×3): 81 mg via ORAL
  Filled 2022-04-25 (×3): qty 1

## 2022-04-25 MED ORDER — LIDOCAINE 5 % EX PTCH
1.0000 | MEDICATED_PATCH | CUTANEOUS | Status: DC
  Administered 2022-04-25 – 2022-04-28 (×4): 1 via TRANSDERMAL
  Filled 2022-04-25 (×4): qty 1

## 2022-04-25 MED ORDER — METOPROLOL TARTRATE 25 MG PO TABS
25.0000 mg | ORAL_TABLET | Freq: Two times a day (BID) | ORAL | Status: DC
  Filled 2022-04-25 (×2): qty 1

## 2022-04-25 MED ORDER — OXYCODONE-ACETAMINOPHEN 5-325 MG PO TABS
1.0000 | ORAL_TABLET | ORAL | Status: DC | PRN
  Administered 2022-04-25: 1 via ORAL
  Filled 2022-04-25: qty 1

## 2022-04-25 MED ORDER — SENNOSIDES-DOCUSATE SODIUM 8.6-50 MG PO TABS
1.0000 | ORAL_TABLET | Freq: Two times a day (BID) | ORAL | Status: DC
Start: 2022-04-25 — End: 2022-04-29
  Administered 2022-04-25 – 2022-04-29 (×8): 1 via ORAL
  Filled 2022-04-25 (×8): qty 1

## 2022-04-25 MED ORDER — MORPHINE SULFATE (PF) 2 MG/ML IV SOLN: 2.0000 mg | INTRAVENOUS | Status: DC | PRN

## 2022-04-25 MED ORDER — SODIUM CHLORIDE 0.9% FLUSH
3.0000 mL | Freq: Two times a day (BID) | INTRAVENOUS | Status: DC
Start: 2022-04-25 — End: 2022-04-29
  Administered 2022-04-25 – 2022-04-29 (×7): 3 mL via INTRAVENOUS

## 2022-04-25 MED ORDER — ALPRAZOLAM 1 MG PO TABS
1.0000 mg | ORAL_TABLET | Freq: Two times a day (BID) | ORAL | Status: DC | PRN
  Administered 2022-04-26 (×3): 1 mg via ORAL
  Filled 2022-04-25 (×3): qty 1

## 2022-04-25 MED ORDER — METHOCARBAMOL 500 MG PO TABS
500.0000 mg | ORAL_TABLET | Freq: Once | ORAL | Status: AC
  Administered 2022-04-25: 500 mg via ORAL
  Filled 2022-04-25: qty 1

## 2022-04-25 MED ORDER — ALBUTEROL SULFATE HFA 108 (90 BASE) MCG/ACT IN AERS: 2.0000 | INHALATION_SPRAY | RESPIRATORY_TRACT | Status: DC | PRN

## 2022-04-25 MED ORDER — ACETAMINOPHEN 650 MG RE SUPP: 650.0000 mg | Freq: Four times a day (QID) | RECTAL | Status: DC | PRN

## 2022-04-25 NOTE — ED Notes (Signed)
Patient transported to CT 

## 2022-04-25 NOTE — ED Provider Notes (Signed)
[facility] EMERGENCY DEPARTMENT Provider Note   CSN: [id number] Arrival date & time: 04/25/22  7322     History {Add pertinent medical, surgical, social history, OB history to HPI:1} Chief Complaint  Patient presents with   Abdominal Pain    Heather [name] is a 88 y.o. female.   Abdominal Pain Associated symptoms: chest pain and constipation   Patient presents for abdominal pain.  Medical history includes anxiety, GERD, HLD, HTN, IBS, cirrhosis.  She was seen at [facility] emergency department 4 days ago for urinary frequency, lower abdominal pain, and lower back pain.  At that time, she had not had a bowel movement in 3 days.  She underwent a CT scan of abdomen and pelvis with no acute findings.  Patient reports that pain worsened in severity today.  Due to the severity pain, patient was unable to be assisted out of her bed.  At the time, she also endorsed chest pain.  She continues to endorse constipation.  She also endorses recent urinary incontinence.  History per daughter: Patient had a remote fall in February.  She did sustain a lower back injury at that time.  Because of this, she was living in a rehab facility.  She is currently living in an assisted living facility.  Patient underwent a steroid shot in her lower back 3 weeks ago.  She did have some improvement in her symptoms after that but her pain has been worsening over the past 4 days.  Patient does not use oxygen at baseline.     Home Medications Prior to Admission medications   Medication Sig Start Date End Date Taking? Authorizing Provider  acetaminophen (TYLENOL) 500 MG tablet Take 500 mg by mouth every 4 (four) hours as needed for mild pain, moderate pain or fever.    [provider]  albuterol (VENTOLIN HFA) 108 (90 Base) MCG/ACT inhaler Inhale 2 puffs into the lungs every 4 (four) hours as needed for wheezing or shortness of breath. 01/28/22   Roxan Hockey, MD  ALPRAZolam Duanne Moron) 1 MG tablet Take 1  tablet (1 mg total) by mouth 3 (three) times daily. 01/28/22   Roxan Hockey, MD  alum & mag hydroxide-simeth (MAALOX/MYLANTA) 200-200-20 MG/5ML suspension Take 30 mLs by mouth every 6 (six) hours as needed for indigestion or heartburn.    [provider]  amLODipine (NORVASC) 10 MG tablet Take 10 mg by mouth every morning.  07/14/17   [provider]  aspirin 81 MG tablet Take 1 tablet (81 mg total) by mouth daily with breakfast. 01/28/22   Emokpae, Courage, MD  Calcium-Magnesium-Zinc (CAL-MAG-ZINC PO) Take 1 tablet by mouth daily.    [provider]  Diclofenac Sodium 3 % GEL Apply 1-2 g topically in the morning, at noon, and at bedtime.    [provider]  dicyclomine (BENTYL) 10 MG capsule TAKE ONE CAPSULE BY MOUTH FOUR TIMES DAILY BEFORE MEALS and AT BEDTIME Patient taking differently: Take 10 mg by mouth daily at 6 (six) AM. 12/21/21   Montez Morita, Quillian Quince, MD  ferrous sulfate 325 (65 FE) MG tablet Take 325 mg by mouth daily with breakfast.    [provider]  fluticasone furoate-vilanterol (BREO ELLIPTA) 100-25 MCG/ACT AEPB Inhale 1 puff into the lungs daily. 03/04/20   [provider]  furosemide (LASIX) 40 MG tablet Take 40 mg by mouth daily.    [provider]  gabapentin (NEURONTIN) 300 MG capsule Take 300 mg by mouth daily. 01/17/22   [provider]  HYDROcodone-acetaminophen (NORCO/VICODIN) 5-325 MG tablet SMARTSIG:1 Tablet(s) By Mouth Every 12 Hours PRN 02/17/22   [provider]  isosorbide mononitrate (IMDUR) 60 MG 24 hr tablet Take 60 mg by mouth daily. 01/17/22   [provider]  lactulose (CHRONULAC) 10 GM/15ML solution Take 30 mLs (20 g total) by mouth 2 (two) times daily. Goal is to Have at least 2 mushy/Non-solid bowel movements Each day-- May give 1 additional dose of lactulose if no bowel movement in 24 hr period 01/28/22   Roxan Hockey, MD  metoprolol tartrate (LOPRESSOR) 25 MG tablet  Take 25 mg by mouth 2 (two) times daily. 07/10/18   [provider]  MILK OF MAGNESIA 400 MG/5ML suspension SMARTSIG:30 Milliliter(s) By Mouth Every Night PRN 02/17/22   [provider]  Naphazoline-Pheniramine (OPCON-A) 0.027-0.315 % SOLN Place 1 drop into both eyes 2 (two) times daily as needed (dry eyes).    [provider]  nitroGLYCERIN (NITROSTAT) 0.4 MG SL tablet Place 0.4 mg under the tongue every 5 (five) minutes as needed for chest pain.    [provider]  omeprazole (PRILOSEC) 40 MG capsule Take 40 mg by mouth daily.    [provider]  potassium chloride (KLOR-CON) 10 MEQ tablet Take 1 tablet (10 mEq total) by mouth daily. Take While taking Lactulose 01/28/22   Emokpae, Courage, MD  sucralfate (CARAFATE) 1 g tablet Take 1 g by mouth 4 (four) times daily. 01/17/22   [provider]  traMADol (ULTRAM) 50 MG tablet Take 50 mg by mouth 2 (two) times daily as needed. 03/04/22   [provider]      Allergies    Patient has no known allergies.    Review of Systems   Review of Systems  Cardiovascular:  Positive for chest pain.  Gastrointestinal:  Positive for abdominal pain and constipation.  Genitourinary:  Positive for difficulty urinating.  Musculoskeletal:  Positive for back pain.  All other systems reviewed and are negative.   Physical Exam Updated Vital Signs There were no vitals taken for this visit. Physical Exam Vitals and nursing note reviewed.  Constitutional:      General: She is not in acute distress.    Appearance: She is well-developed. She is ill-appearing (Chronically). She is not toxic-appearing or diaphoretic.  HENT:     Head: Normocephalic and atraumatic.     Mouth/Throat:     Mouth: Mucous membranes are moist.     Pharynx: Oropharynx is clear.  Eyes:     Extraocular Movements: Extraocular movements intact.     Conjunctiva/sclera: Conjunctivae normal.  Cardiovascular:     Rate and Rhythm: Normal  rate and regular rhythm.     Heart sounds: No murmur heard. Pulmonary:     Effort: Pulmonary effort is normal. No respiratory distress.     Breath sounds: Normal breath sounds. No wheezing or rales.  Chest:     Chest wall: No tenderness.  Abdominal:     Palpations: Abdomen is soft.     Tenderness: There is abdominal tenderness in the suprapubic area.  Musculoskeletal:        General: No swelling.     Cervical back: Neck supple.  Skin:    General: Skin is warm and dry.  Neurological:     General: No focal deficit present.     Mental Status: She is alert and oriented to person, place, and time.     Cranial Nerves: Cranial nerves 2-12 are intact. No cranial nerve deficit, dysarthria or  facial asymmetry.     Sensory: Sensation is intact. No sensory deficit.     Motor: Weakness (BLE) present.  Psychiatric:        Mood and Affect: Mood normal.        Behavior: Behavior normal.     ED Results / Procedures / Treatments   Labs (all labs ordered are listed, but only abnormal results are displayed) Labs Reviewed - No data to display  EKG None  Radiology No results found.  Procedures Procedures  {Document cardiac monitor, telemetry assessment procedure when appropriate:1}  Medications Ordered in ED Medications - No data to display  ED Course/ Medical Decision Making/ A&P                           Medical Decision Making Amount and/or Complexity of Data Reviewed Labs: ordered. Radiology: ordered.  Risk OTC drugs. Prescription drug management.   This patient presents to the ED for concern of lower abdominal pain and lower back pain, this involves an extensive number of treatment options, and is a complaint that carries with it a high risk of complications and morbidity.  The differential diagnosis includes occult injury, pyelonephritis, neoplasm, constipation, urinary retention, spinal compression syndrome   Co morbidities that complicate the patient  evaluation  anxiety, GERD, HLD, HTN, IBS, cirrhosis   Additional history obtained:  Additional history obtained from patient's daughter External records from outside source obtained and reviewed including EMR   Lab Tests:  I Ordered, and personally interpreted labs.  The pertinent results include: Clyda Hurdle is elevated.  Patient has no leukocytosis.  Her electrolytes are normal.  Troponin is normal and D-dimer is moderately elevated.   Imaging Studies ordered:  I ordered imaging studies including CTA chest, CT of abdomen and pelvis, MRI of lumbar spine I independently visualized and interpreted imaging which showed acute L1 compression fracture; redemonstration of known L2 compression fracture with progression of canal and foraminal stenosis; distention of ascending and transverse colon with stool burden present I agree with the radiologist interpretation   Cardiac Monitoring: / EKG:  The patient was maintained on a cardiac monitor.  I personally viewed and interpreted the cardiac monitored which showed an underlying rhythm of: Sinus rhythm  Problem List / ED Course / Critical interventions / Medication management  Patient is a pleasant 88 year old female presenting from her assisted living facility for low back pain, lower abdominal pain, and inability to get up out of bed due to the pain.  She also endorses some transient chest pain when she was trying to get up.  Patient has history of remote lower back compression fracture.  She did wear a brace and did have a stay in a rehab facility following this injury, which her daughter states was in February.  She has developed recurrent symptoms over the past several days.  She was seen in outside hospital ED for evaluation 4 days ago.  Patient states that she has not had a bowel movement in the past 5 days.  On exam, patient has lower abdominal tenderness as well as lower back tenderness.  Although she is not on home oxygen, patient is observed  to have SPO2 in the mid to high 80s on room air.  She was placed on 2 L of supplemental oxygen.  Vital signs are otherwise normal.  Patient was given multimodal pain control.  Right diagnostic work-up was initiated. I ordered medication including ***  for ***  Reevaluation of  the patient after these medicines showed that the patient {resolved/improved/worsened:23923::"improved"} I have reviewed the patients home medicines and have made adjustments as needed   Social Determinants of Health:  ***   Test / Admission - Considered:  ***   {Document critical care time when appropriate:1} {Document review of labs and clinical decision tools ie heart score, Chads2Vasc2 etc:1}  {Document your independent review of radiology images, and any outside records:1} {Document your discussion with family members, caretakers, and with consultants:1} {Document social determinants of health affecting pt's care:1} {Document your decision making why or why not admission, treatments were needed:1} Final Clinical Impression(s) / ED Diagnoses Final diagnoses:  None    Rx / DC Orders ED Discharge Orders     None

## 2022-04-25 NOTE — ED Notes (Signed)
Pt taken off of bedpan. No BM at this time. Clean brief and purewick placed on pt

## 2022-04-25 NOTE — H&P (Addendum)
History and Physical    Patient: Heather [name] OVF:[id number] DOB: 07-18-1931 DOA: 04/25/2022 DOS: the patient was seen and examined on 04/25/2022 PCP: Glenda Chroman, MD  Patient coming from: Home  Chief Complaint:  Chief Complaint  Patient presents with   Abdominal Pain   HPI: Heather [name] is a 88 y.o. female with medical history significant of hypertension, hyperlipidemia, anxiety, and GERD who presents with complaints of abdominal pain.  Pain is in the lower abdomen and into the back.  Reports associated symptoms of chest discomfort, urinary frequency, constipation for which her last bowel movement was 3-4 days ago.  Patient previously had fallen and sustained a compression fracture of L2 back in February for which she was sent to rehab.  She does not report any recent falls onset symptoms. Due to her symptoms she was needing significant assistance to get up.  Upon admission to the emergency department patient was seen to be afebrile with O2 saturations as low as 88% with improvement on 2 L nasal cannula oxygen.  Labs noted WBC 10.5, hemoglobin 16.4, high-sensitivity troponins negative x2, and D-dimer 1.87.  Urinalysis did not show any significant signs of infection.  CT angiogram of the chest abdomen pelvis noted new L1 compression fracture with chronic L2 compression fracture, distention of the cecum without signs of infection.  Review of Systems: As mentioned in the history of present illness. All other systems reviewed and are negative. Past Medical History:  Diagnosis Date   Anxiety    GERD (gastroesophageal reflux disease)    Hx of shoulder surgery    Left Shoulder - pitched nerve.   Other and unspecified hyperlipidemia    Unspecified essential hypertension    Past Surgical History:  Procedure Laterality Date   BIOPSY  04/09/2020   Procedure: BIOPSY;  Surgeon: Rogene Houston, MD;  Location: [facility];  Service: Endoscopy;;   CERVICAL [place]   colonscopy  08/22/2012   moderate diverticulosos in sigmoid and two tubular adenomas   ESOPHAGOGASTRODUODENOSCOPY N/A 04/09/2020   [place]hman:Normal hypopharynx.normal esophagus, z line irreg, 38 cm from incisors, erythematous mucosa in antrum and preplyoric region, gastric antral mucosa with non specific reactive gastropathy, normal duodenal bul and second portion of duodenum   History of shoulder surgery Left    pitched nerve.   Social History:  reports that she has never smoked. She has never used smokeless tobacco. She reports that she does not drink alcohol and does not use drugs.  No Known Allergies  Family History  Problem Relation Age of Onset   Hypertension Other    Heart attack Sister     Prior to Admission medications   Medication Sig Start Date End Date Taking? Authorizing Provider  acetaminophen (TYLENOL) 500 MG tablet Take 500 mg by mouth every 4 (four) hours as needed for mild pain, moderate pain or fever.    [provider]  albuterol (VENTOLIN HFA) 108 (90 Base) MCG/ACT inhaler Inhale 2 puffs into the lungs every 4 (four) hours as needed for wheezing or shortness of breath. 01/28/22   Roxan Hockey, MD  ALPRAZolam Duanne Moron) 1 MG tablet Take 1 tablet (1 mg total) by mouth 3 (three) times daily. 01/28/22   Roxan Hockey, MD  alum & mag hydroxide-simeth (MAALOX/MYLANTA) 200-200-20 MG/5ML suspension Take 30 mLs by mouth every 6 (six) hours as needed for indigestion or heartburn.    [provider]  amLODipine (NORVASC) 10 MG tablet Take 10  mg by mouth every morning.  07/14/17   [provider]  aspirin 81 MG tablet Take 1 tablet (81 mg total) by mouth daily with breakfast. 01/28/22   Emokpae, Courage, MD  Calcium-Magnesium-Zinc (CAL-MAG-ZINC PO) Take 1 tablet by mouth daily.    [provider]  Diclofenac Sodium 3 % GEL Apply 1-2 g topically in the morning, at noon, and at bedtime.    [provider]  dicyclomine (BENTYL)  10 MG capsule TAKE ONE CAPSULE BY MOUTH FOUR TIMES DAILY BEFORE MEALS and AT BEDTIME Patient taking differently: Take 10 mg by mouth daily at 6 (six) AM. 12/21/21   Montez Morita, Quillian Quince, MD  ferrous sulfate 325 (65 FE) MG tablet Take 325 mg by mouth daily with breakfast.    [provider]  fluticasone furoate-vilanterol (BREO ELLIPTA) 100-25 MCG/ACT AEPB Inhale 1 puff into the lungs daily. 03/04/20   [provider]  furosemide (LASIX) 40 MG tablet Take 40 mg by mouth daily.    [provider]  gabapentin (NEURONTIN) 300 MG capsule Take 300 mg by mouth daily. 01/17/22   [provider]  HYDROcodone-acetaminophen (NORCO/VICODIN) 5-325 MG tablet SMARTSIG:1 Tablet(s) By Mouth Every 12 Hours PRN 02/17/22   [provider]  isosorbide mononitrate (IMDUR) 60 MG 24 hr tablet Take 60 mg by mouth daily. 01/17/22   [provider]  lactulose (CHRONULAC) 10 GM/15ML solution Take 30 mLs (20 g total) by mouth 2 (two) times daily. Goal is to Have at least 2 mushy/Non-solid bowel movements Each day-- May give 1 additional dose of lactulose if no bowel movement in 24 hr period 01/28/22   Roxan Hockey, MD  metoprolol tartrate (LOPRESSOR) 25 MG tablet Take 25 mg by mouth 2 (two) times daily. 07/10/18   [provider]  MILK OF MAGNESIA 400 MG/5ML suspension SMARTSIG:30 Milliliter(s) By Mouth Every Night PRN 02/17/22   [provider]  Naphazoline-Pheniramine (OPCON-A) 0.027-0.315 % SOLN Place 1 drop into both eyes 2 (two) times daily as needed (dry eyes).    [provider]  nitroGLYCERIN (NITROSTAT) 0.4 MG SL tablet Place 0.4 mg under the tongue every 5 (five) minutes as needed for chest pain.    [provider]  omeprazole (PRILOSEC) 40 MG capsule Take 40 mg by mouth daily.    [provider]  potassium chloride (KLOR-CON) 10 MEQ tablet Take 1 tablet (10 mEq total) by mouth daily. Take While taking Lactulose 01/28/22    Emokpae, Courage, MD  sucralfate (CARAFATE) 1 g tablet Take 1 g by mouth 4 (four) times daily. 01/17/22   [provider]  traMADol (ULTRAM) 50 MG tablet Take 50 mg by mouth 2 (two) times daily as needed. 03/04/22   [provider]    Physical Exam: Vitals:   04/25/22 1030 04/25/22 1210 04/25/22 1212 04/25/22 1538  BP: 126/69 136/64  130/70  Pulse: (!) 57 (!) 59 (!) 57 60  Resp: '18 15 15 14  '$ Temp:    97.8 F (36.6 C)  TempSrc:    Oral  SpO2: 93% 96% 96% 96%  Weight:      Height:       Constitutional: Elderly female currently in no acute distress Eyes: PERRL, lids and conjunctivae normal ENMT: Mucous membranes are moist. Posterior pharynx clear of any exudate or lesions.Normal dentition.  Neck: normal, supple, no masses, no thyromegaly Respiratory: Normal respiratory effort without significant wheezes or rhonchi appreciated currently on 2 L nasal cannula oxygen with O2 saturation maintained. Cardiovascular:  Regular rate and rhythm, no murmurs / rubs / gallops. No extremity edema. 2+ pedal pulses. No carotid bruits.  Abdomen: Distended abdomen without tenderness to palpation at this time.  Bowel sounds positive.  Musculoskeletal: no clubbing / cyanosis.  Tenderness to palpation of the lumbar spine. Skin: no rashes, lesions, ulcers. No induration Neurologic: CN 2-12 grossly intact.  Able to move all extremities. Psychiatric: Normal judgment and insight. Alert and oriented x 3. Normal mood.   Data Reviewed:  EKG reveals sinus bradycardia 52 bpm with first-degree heart block.  Assessment and Plan:  Compression fracture L1 history of compression fracture L2 Acute.  Patient presented with complaints of abdominal pain and back pain.  Found to have a new compression fracture of L1 noted prior L2 compression fracture. -Admit to medical telemetry bed -Continue lidocaine patch -Oxycodone/morphine IV as needed for moderate to severe pain -Daughter to bring in TLSO  brace -PT/OT to eval and treat in a.m.  Acute respiratory failure with hypoxia Patient was noted to have O2 saturations as low as 88% on room air for which she was placed on 2 L nasal cannula oxygen.  D-dimer was noted to be elevated, but CT angiogram of the chest did not note any concern for pulmonary embolus.  Suspect likely secondary to splinting due to pain. -Continuous pulse oximetry with nasal cannula oxygen to maintain O2 saturation greater than 92%  Constipation Acute.  CT imaging noted concern for significant distention of the cecum without signs of bowel obstruction. -Monitor intake and output -Smog enema -Senokot-S twice daily  Essential hypertension Home medication regimen appears to include amlodipine 10 mg daily, isosorbide mononitrate 60 mg daily, metoprolol 25 mg twice daily. -Continue home medication regimen as tolerated  Anxiety -Continue Xanax as needed  Esophageal hernia GERD -Continue Protonix   DVT prophylaxis: Lovenox  Advance Care Planning:   Code Status: Full Code   Consults: None  Family Communication: None  Severity of Illness: The appropriate patient status for this patient is OBSERVATION. Observation status is judged to be reasonable and necessary in order to provide the required intensity of service to ensure the patient's safety. The patient's presenting symptoms, physical exam findings, and initial radiographic and laboratory data in the context of their medical condition is felt to place them at decreased risk for further clinical deterioration. Furthermore, it is anticipated that the patient will be medically stable for discharge from the hospital within 2 midnights of admission.   Author: Norval Morton, MD 04/25/2022 5:07 PM  For on call review [email or web address].

## 2022-04-25 NOTE — ED Triage Notes (Signed)
Patient via EMS due to abdominal pain with constipation for 4 days.

## 2022-04-25 NOTE — ED Notes (Signed)
Spoke with pharmacy, notified nurse wilL bring sorbitol medication to Ed, currently available

## 2022-04-25 NOTE — ED Notes (Signed)
Pt placed on bedpan; pt feels like they need to have a bowel movement

## 2022-04-25 NOTE — ED Notes (Signed)
Patient transported to MRI 

## 2022-04-25 NOTE — ED Notes (Signed)
2L [place] placed on pt per edp

## 2022-04-26 ENCOUNTER — Observation Stay (HOSPITAL_COMMUNITY): Payer: PPO

## 2022-04-26 DIAGNOSIS — R072 Precordial pain: Secondary | ICD-10-CM | POA: Diagnosis not present

## 2022-04-26 DIAGNOSIS — Z8249 Family history of ischemic heart disease and other diseases of the circulatory system: Secondary | ICD-10-CM | POA: Diagnosis not present

## 2022-04-26 DIAGNOSIS — G8929 Other chronic pain: Secondary | ICD-10-CM

## 2022-04-26 DIAGNOSIS — R001 Bradycardia, unspecified: Secondary | ICD-10-CM | POA: Diagnosis present

## 2022-04-26 DIAGNOSIS — K921 Melena: Secondary | ICD-10-CM | POA: Diagnosis not present

## 2022-04-26 DIAGNOSIS — K219 Gastro-esophageal reflux disease without esophagitis: Secondary | ICD-10-CM | POA: Diagnosis present

## 2022-04-26 DIAGNOSIS — M4854XA Collapsed vertebra, not elsewhere classified, thoracic region, initial encounter for fracture: Secondary | ICD-10-CM | POA: Diagnosis present

## 2022-04-26 DIAGNOSIS — J9601 Acute respiratory failure with hypoxia: Secondary | ICD-10-CM | POA: Diagnosis present

## 2022-04-26 DIAGNOSIS — S32010A Wedge compression fracture of first lumbar vertebra, initial encounter for closed fracture: Secondary | ICD-10-CM | POA: Diagnosis not present

## 2022-04-26 DIAGNOSIS — R935 Abnormal findings on diagnostic imaging of other abdominal regions, including retroperitoneum: Secondary | ICD-10-CM | POA: Diagnosis not present

## 2022-04-26 DIAGNOSIS — F419 Anxiety disorder, unspecified: Secondary | ICD-10-CM | POA: Diagnosis not present

## 2022-04-26 DIAGNOSIS — Z7982 Long term (current) use of aspirin: Secondary | ICD-10-CM | POA: Diagnosis not present

## 2022-04-26 DIAGNOSIS — K7460 Unspecified cirrhosis of liver: Secondary | ICD-10-CM | POA: Diagnosis present

## 2022-04-26 DIAGNOSIS — M4856XA Collapsed vertebra, not elsewhere classified, lumbar region, initial encounter for fracture: Secondary | ICD-10-CM | POA: Diagnosis present

## 2022-04-26 DIAGNOSIS — R079 Chest pain, unspecified: Secondary | ICD-10-CM

## 2022-04-26 DIAGNOSIS — K581 Irritable bowel syndrome with constipation: Secondary | ICD-10-CM | POA: Diagnosis present

## 2022-04-26 DIAGNOSIS — M549 Dorsalgia, unspecified: Secondary | ICD-10-CM | POA: Diagnosis present

## 2022-04-26 DIAGNOSIS — R109 Unspecified abdominal pain: Secondary | ICD-10-CM | POA: Diagnosis not present

## 2022-04-26 DIAGNOSIS — Z79899 Other long term (current) drug therapy: Secondary | ICD-10-CM | POA: Diagnosis not present

## 2022-04-26 DIAGNOSIS — E785 Hyperlipidemia, unspecified: Secondary | ICD-10-CM | POA: Diagnosis present

## 2022-04-26 DIAGNOSIS — F0394 Unspecified dementia, unspecified severity, with anxiety: Secondary | ICD-10-CM | POA: Diagnosis present

## 2022-04-26 DIAGNOSIS — K7581 Nonalcoholic steatohepatitis (NASH): Secondary | ICD-10-CM | POA: Diagnosis present

## 2022-04-26 DIAGNOSIS — R1033 Periumbilical pain: Secondary | ICD-10-CM | POA: Diagnosis not present

## 2022-04-26 DIAGNOSIS — K5900 Constipation, unspecified: Secondary | ICD-10-CM | POA: Diagnosis not present

## 2022-04-26 DIAGNOSIS — J9811 Atelectasis: Secondary | ICD-10-CM | POA: Diagnosis present

## 2022-04-26 DIAGNOSIS — I10 Essential (primary) hypertension: Secondary | ICD-10-CM | POA: Diagnosis present

## 2022-04-26 DIAGNOSIS — K6389 Other specified diseases of intestine: Secondary | ICD-10-CM | POA: Diagnosis present

## 2022-04-26 LAB — BASIC METABOLIC PANEL
Anion gap: 10 (ref 5–15)
BUN: 19 mg/dL (ref 8–23)
CO2: 27 mmol/L (ref 22–32)
Calcium: 8.9 mg/dL (ref 8.9–10.3)
Chloride: 99 mmol/L (ref 98–111)
Creatinine, Ser: 0.97 mg/dL (ref 0.44–1.00)
GFR, Estimated: 56 mL/min — ABNORMAL LOW (ref >60)
Glucose, Bld: 129 mg/dL — ABNORMAL HIGH (ref 70–99)
Potassium: 3.9 mmol/L (ref 3.5–5.1)
Sodium: 136 mmol/L (ref 135–145)

## 2022-04-26 LAB — CBC
HCT: 45.6 % (ref 36.0–46.0)
Hemoglobin: 15.1 g/dL — ABNORMAL HIGH (ref 12.0–15.0)
MCH: 29.6 pg (ref 26.0–34.0)
MCHC: 33.1 g/dL (ref 30.0–36.0)
MCV: 89.4 fL (ref 80.0–100.0)
Platelets: 194 10*3/uL (ref 150–400)
RBC: 5.1 MIL/uL (ref 3.87–5.11)
RDW: 12.6 % (ref 11.5–15.5)
WBC: 12 10*3/uL — ABNORMAL HIGH (ref 4.0–10.5)
nRBC: 0 % (ref 0.0–0.2)

## 2022-04-26 LAB — RESPIRATORY PANEL BY PCR

## 2022-04-26 LAB — ECHOCARDIOGRAM COMPLETE
Height: 67 in
S' Lateral: 3.3 cm
Weight: 2747.81 oz

## 2022-04-26 LAB — AMMONIA: Ammonia: 23 umol/L (ref 9–35)

## 2022-04-26 LAB — PROCALCITONIN: Procalcitonin: <0.1 ng/mL

## 2022-04-26 LAB — LACTIC ACID, PLASMA: Lactic Acid, Venous: 2.6 mmol/L (ref 0.5–1.9)

## 2022-04-26 MED ORDER — LACTULOSE 10 GM/15ML PO SOLN
20.0000 g | Freq: Two times a day (BID) | ORAL | Status: DC
  Administered 2022-04-26 – 2022-04-29 (×6): 20 g via ORAL
  Filled 2022-04-26 (×6): qty 30

## 2022-04-26 MED ORDER — HALOPERIDOL LACTATE 5 MG/ML IJ SOLN
2.0000 mg | Freq: Four times a day (QID) | INTRAMUSCULAR | Status: DC | PRN
  Administered 2022-04-26: 2 mg via INTRAVENOUS
  Filled 2022-04-26: qty 1

## 2022-04-26 MED ORDER — LACTATED RINGERS IV BOLUS
1000.0000 mL | Freq: Once | INTRAVENOUS | Status: AC
  Administered 2022-04-26: 1000 mL via INTRAVENOUS

## 2022-04-26 MED ORDER — METOPROLOL TARTRATE 25 MG PO TABS
12.5000 mg | ORAL_TABLET | Freq: Two times a day (BID) | ORAL | Status: DC
  Administered 2022-04-26 – 2022-04-28 (×5): 12.5 mg via ORAL
  Filled 2022-04-26 (×4): qty 1

## 2022-04-26 NOTE — Progress Notes (Signed)
Pt found in room very agitated pulling on her IV, attempting to get out of bed and trying to remove tele. Placed mitts and redirected pt. Family walked in, this nurse explained why pt was placed in mitts. This Probation officer notified MD. Administered PRN medication. Will continue to monitor.

## 2022-04-26 NOTE — Assessment & Plan Note (Signed)
PDMP reviewed -- Patient receives tramadol # 71, last refilled 03/30/2022 -Continue gabapentin -Also previously had a prescription for hydrocodone 5/325, #20 on 02/09/2022

## 2022-04-26 NOTE — Assessment & Plan Note (Signed)
PDMP reviewed -- Patient receives Xanax 1 mg, #60, last refilled 04/06/2022

## 2022-04-26 NOTE — Plan of Care (Signed)
  Problem: Acute Rehab OT Goals (only OT should resolve) Goal: Pt. Will Perform Eating Flowsheets (Taken 04/26/2022 1017) Pt Will Perform Eating:  with modified independence  sitting Goal: Pt. Will Perform Grooming Flowsheets (Taken 04/26/2022 1017) Pt Will Perform Grooming:  with modified independence  sitting  standing Goal: Pt. Will Perform Upper Body Dressing Flowsheets (Taken 04/26/2022 1017) Pt Will Perform Upper Body Dressing:  with modified independence  sitting Goal: Pt. Will Transfer To Toilet Flowsheets (Taken 04/26/2022 1017) Pt Will Transfer to Toilet:  with supervision  stand pivot transfer  ambulating  bedside commode  regular height toilet Goal: Pt. Will Perform Toileting-Clothing Manipulation Flowsheets (Taken 04/26/2022 1017) Pt Will Perform Toileting - Clothing Manipulation and hygiene:  with supervision  sitting/lateral leans  sit to/from stand Goal: Pt/Caregiver Will Perform Home Exercise Program Flowsheets (Taken 04/26/2022 1017) Pt/caregiver will Perform Home Exercise Program:  Increased strength  Both right and left upper extremity  With Supervision  With written HEP provided

## 2022-04-26 NOTE — Assessment & Plan Note (Signed)
Abdominal pain seems to be improving after bowel movements here in the hospital -Continue MiraLAX and Senokot

## 2022-04-26 NOTE — Evaluation (Signed)
Occupational Therapy Evaluation Patient Details Name: Heather [name] MRN: [id number] DOB: 01-31-1931 Today's Date: 04/26/2022   History of Present Illness Heather [name] is a 88 y.o. female with medical history significant of hypertension, hyperlipidemia, anxiety, and GERD who presents with complaints of abdominal pain.  Pain is in the lower abdomen and into the back.  Reports associated symptoms of chest discomfort, urinary frequency, constipation for which her last bowel movement was 3-4 days ago.  Patient previously had fallen and sustained a compression fracture of L2 back in February for which she was sent to rehab.  She does not report any recent falls onset symptoms. Due to her symptoms she was needing significant assistance to get up.  CT angiogram of the chest abdomen pelvis noted new L1 compression fracture with chronic L2 compression fracture, distention of the cecum without signs of infection.   Clinical Impression   Pt agreeable to evaluation, seen with PT this am. Pt reporting abdominal discomfort during session. Requiring max assist for LB dressing, min assist for transfer to [facility], max assist for hygiene. PTA pt independent in these tasks. Recommend SNF on discharge to improve pt's safety and independence in ADL completion to enable later return to ALF.       Recommendations for follow up therapy are one component of a multi-disciplinary discharge planning process, led by the attending physician.  Recommendations may be updated based on patient status, additional functional criteria and insurance authorization.   Follow Up Recommendations  Skilled nursing-short term rehab (<3 hours/day)    Assistance Recommended at Discharge Frequent or constant Supervision/Assistance  Patient can return home with the following A lot of help with walking and/or transfers;A lot of help with bathing/dressing/bathroom;Assistance with cooking/housework;Direct supervision/assist for medications  management;Direct supervision/assist for financial management;Assist for transportation;Help with stairs or ramp for entrance    Functional Status Assessment  Patient has had a recent decline in their functional status and demonstrates the ability to make significant improvements in function in a reasonable and predictable amount of time.  Equipment Recommendations  None recommended by OT    Recommendations for Other Services       Precautions / Restrictions Precautions Precautions: Fall Restrictions Weight Bearing Restrictions: No      Mobility Bed Mobility               General bed mobility comments: Defer to PT note    Transfers                   General transfer comment: Defer to PT note          ADL either performed or assessed with clinical judgement   ADL Overall ADL's : Needs assistance/impaired     Grooming: Set up;Sitting Grooming Details (indicate cue type and reason): unable to stand and balance for tasks, set-up for sitting             Lower Body Dressing: Maximal assistance;Bed level;Sitting/lateral leans Lower Body Dressing Details (indicate cue type and reason): assist for donning socks Toilet Transfer: Minimal assistance;Stand-pivot;BSC/3in1;Rolling walker (2 wheels) Toilet Transfer Details (indicate cue type and reason): Pt transferring to [facility] using RW Toileting- Clothing Manipulation and Hygiene: Maximal assistance;Sit to/from stand Toileting - Clothing Manipulation Details (indicate cue type and reason): Pt standing at walker, max assist for peri-hygiene       General ADL Comments: Pt requiring increased assistance for ADL completion due to weakness and balance difficulties      Pertinent Vitals/Pain Pain Assessment Pain Assessment: Faces  Faces Pain Scale: Hurts little more Pain Location: abdomen Pain Descriptors / Indicators: Grimacing Pain Intervention(s): Limited activity within patient's tolerance, Monitored during  session, Repositioned     Hand Dominance Right   Extremity/Trunk Assessment Upper Extremity Assessment Upper Extremity Assessment: Generalized weakness   Lower Extremity Assessment Lower Extremity Assessment: Defer to PT evaluation   Cervical / Trunk Assessment Cervical / Trunk Assessment: Kyphotic   Communication Communication Communication: No difficulties   Cognition Arousal/Alertness: Awake/alert Behavior During Therapy: WFL for tasks assessed/performed Overall Cognitive Status: Within Functional Limits for tasks assessed                                                  Home Living Family/patient expects to be discharged to:: Assisted living                             Home Equipment: Rolling Walker (2 wheels);Rollator (4 wheels);BSC/3in1;Shower seat;Grab bars - toilet;Grab bars - tub/shower   Additional Comments: Pt receives assistance for dressing, meals are provided at facility      Prior Functioning/Environment Prior Level of Function : Needs assist       Physical Assist : Mobility (physical);ADLs (physical) Mobility (physical): Gait ADLs (physical): Bathing;IADLs Mobility Comments: Uses RW primarily for mobility ADLs Comments: ALF staff assist as needed, assist with bathing daily        OT Problem List: Decreased strength;Decreased activity tolerance;Impaired balance (sitting and/or standing);Decreased safety awareness;Decreased knowledge of use of DME or AE      OT Treatment/Interventions: Self-care/ADL training;Therapeutic exercise;DME and/or AE instruction;Therapeutic activities;Patient/family education    OT Goals(Current goals can be found in the care plan section) Acute Rehab OT Goals Patient Stated Goal: To go back to ALF OT Goal Formulation: With patient Time For Goal Achievement: 05/10/22 Potential to Achieve Goals: Good  OT Frequency: Min 1X/week    Co-evaluation PT/OT/SLP Co-Evaluation/Treatment:  Yes Reason for Co-Treatment: Complexity of the patient's impairments (multi-system involvement)   OT goals addressed during session: ADL's and self-care      AM-PAC OT "6 Clicks" Daily Activity     Outcome Measure Help from another person eating meals?: A Little Help from another person taking care of personal grooming?: A Lot Help from another person toileting, which includes using toliet, bedpan, or urinal?: A Lot Help from another person bathing (including washing, rinsing, drying)?: A Lot Help from another person to put on and taking off regular upper body clothing?: A Lot Help from another person to put on and taking off regular lower body clothing?: A Lot 6 Click Score: 13   End of Session Equipment Utilized During Treatment: Gait belt;Rolling walker (2 wheels);Back brace Nurse Communication: Mobility status  Activity Tolerance: Patient tolerated treatment well Patient left: in chair;with call bell/phone within reach;with nursing/sitter in room  OT Visit Diagnosis: Muscle weakness (generalized) (M62.81);History of falling (Z91.81)                Time: 8841-6606 OT Time Calculation (min): 30 min Charges:  OT General Charges $OT Visit: 1 Visit OT Evaluation $OT Eval Low Complexity: [place], OTR/L  [phone] 04/26/2022, 10:15 AM

## 2022-04-26 NOTE — NC FL2 (Signed)
[place] LEVEL OF CARE SCREENING TOOL     IDENTIFICATION  Patient Name: Heather [name] Birthdate: 03-25-1931 Sex: female Admission Date (Current Location): 04/25/2022  [employer] and [place] Number:  [employer] and Address:  [facility] [address], [place]      Provider Number: [id number]  Attending Physician Name and Address:  Orson Eva, MD  Relative Name and Phone Number:       Current Level of Care: Hospital Recommended Level of Care: [place] Prior Approval Number:    Date Approved/Denied:   PASRR Number: [id number]  Discharge Plan: SNF    Current Diagnoses: Patient Active Problem List   Diagnosis Date Noted   Abnormal CT of the abdomen 04/26/2022   Chronic back pain 04/26/2022   Closed compression fracture of body of L1 vertebra ([place]) 04/25/2022   Pressure injury of skin 04/25/2022   Acute respiratory failure with hypoxia ([place]) 04/25/2022   Low back pain 02/09/2022   Aspiration pneumonia ([place]) 01/27/2022   Elevated troponin 01/27/2022   Sepsis ([place]) 01/27/2022   Multifocal pneumonia 01/27/2022   Liver cirrhosis secondary to NASH ([place]) 11/03/2021   IBS (irritable bowel syndrome) 08/20/2020   Nausea with vomiting 06/18/2020   Anorexia 11/11/2019   Abdominal pain 08/12/2019   Constipation 08/12/2019   Rash and nonspecific skin eruption 08/12/2019   Elevated LFTs 08/12/2019   Carpal tunnel syndrome, right upper limb 08/31/2018   Essential hypertension 06/09/2018   Anxiety 06/09/2018   GERD (gastroesophageal reflux disease) 06/09/2018   Palpitations 04/22/2012   Chest pain 04/22/2012   Coronary artery disease excluded 04/22/2012   Insomnia 04/22/2012    Orientation RESPIRATION BLADDER Height & Weight     Self, Time, Situation, Place  O2 (see dc summary) Continent Weight: 171 lb 11.8 oz (77.9 kg) Height:  '5\' 7"'$  (170.2 cm)  BEHAVIORAL SYMPTOMS/MOOD NEUROLOGICAL BOWEL NUTRITION  STATUS      Continent Diet (see dc summary)  AMBULATORY STATUS COMMUNICATION OF NEEDS Skin   Extensive Assist Verbally PU Stage and Appropriate Care (sacrum) PU Stage 1 Dressing: Daily                     Personal Care Assistance Level of Assistance  Bathing, Feeding, Dressing Bathing Assistance: Limited assistance Feeding assistance: Independent Dressing Assistance: Limited assistance     Functional Limitations Info  Sight, Hearing, Speech Sight Info: Adequate Hearing Info: Adequate Speech Info: Adequate    SPECIAL CARE FACTORS FREQUENCY  PT (By licensed PT), OT (By licensed OT)     PT Frequency: 5x week OT Frequency: 3x week            Contractures Contractures Info: Not present    Additional Factors Info  Code Status, Allergies, Psychotropic Code Status Info: Full Allergies Info: NKA Psychotropic Info: Xanax         Current Medications (04/26/2022):  This is the current hospital active medication list Current Facility-Administered Medications  Medication Dose Route Frequency Provider Last Rate Last Admin   acetaminophen (TYLENOL) tablet 650 mg  650 mg Oral Q6H PRN Norval Morton, MD       Or   acetaminophen (TYLENOL) suppository 650 mg  650 mg Rectal Q6H PRN Fuller Plan A, MD       albuterol (PROVENTIL) (2.5 MG/3ML) 0.083% nebulizer solution 2.5 mg  2.5 mg Nebulization Q4H PRN Norval Morton, MD       ALPRAZolam Duanne Moron) tablet 1 mg  1 mg Oral BID PRN Fuller Plan A, MD   1 mg at 04/26/22 1155   aspirin EC tablet 81 mg  81 mg Oral Q breakfast Fuller Plan A, MD   81 mg at 04/26/22 0843   enoxaparin (LOVENOX) injection 40 mg  40 mg Subcutaneous Q24H Smith, Rondell A, MD   40 mg at 04/25/22 1831   fluticasone furoate-vilanterol (BREO ELLIPTA) 100-25 MCG/ACT 1 puff  1 puff Inhalation Daily Fuller Plan A, MD   1 puff at 04/26/22 0823   gabapentin (NEURONTIN) capsule 300 mg  300 mg Oral Daily Tamala Julian, Rondell A, MD   300 mg at 04/26/22 0844    isosorbide mononitrate (IMDUR) 24 hr tablet 60 mg  60 mg Oral Daily Tamala Julian, Rondell A, MD   60 mg at 04/26/22 5462   lactated ringers infusion   Intravenous Continuous Fuller Plan A, MD   Stopped at 04/26/22 0204   lidocaine (LIDODERM) 5 % 1 patch  1 patch Transdermal Q24H Fuller Plan A, MD   1 patch at 04/25/22 1454   metoprolol tartrate (LOPRESSOR) tablet 12.5 mg  12.5 mg Oral BID Tat, Shanon Brow, MD   12.5 mg at 04/26/22 0843   morphine (PF) 2 MG/ML injection 2 mg  2 mg Intravenous Q4H PRN Fuller Plan A, MD       ondansetron (ZOFRAN) tablet 4 mg  4 mg Oral Q6H PRN Fuller Plan A, MD       Or   ondansetron (ZOFRAN) injection 4 mg  4 mg Intravenous Q6H PRN Fuller Plan A, MD       oxyCODONE-acetaminophen (PERCOCET/ROXICET) 5-325 MG per tablet 1 tablet  1 tablet Oral Q4H PRN Fuller Plan A, MD   1 tablet at 04/25/22 2146   pantoprazole (PROTONIX) EC tablet 80 mg  80 mg Oral Daily Smith, Rondell A, MD   80 mg at 04/26/22 0844   polyethylene glycol (MIRALAX / GLYCOLAX) packet 17 g  17 g Oral Daily Tamala Julian, Rondell A, MD   17 g at 04/26/22 0844   senna-docusate (Senokot-S) tablet 1 tablet  1 tablet Oral BID Fuller Plan A, MD   1 tablet at 04/26/22 0859   sodium chloride flush (NS) 0.9 % injection 3 mL  3 mL Intravenous Q12H Fuller Plan A, MD   3 mL at 04/26/22 0847     Discharge Medications: Please see discharge summary for a list of discharge medications.  Relevant Imaging Results:  Relevant Lab Results:   Additional Information SSN: [id number], LCSW

## 2022-04-26 NOTE — Assessment & Plan Note (Addendum)
05/22/2022 CT abdomen--marked distention of the cecum extending to the hepatic flexure and transverse colon with air-fluid levels -Suspect this may be due to ileus from the patient's constipation -She has had 3 bowel movements in the last 24 hours since arrival to [facility]>>had 2 episodes small volume hematochezia -Request GI evaluation -UA negative for pyuria

## 2022-04-26 NOTE — Progress Notes (Addendum)
Date and time results received: 04/26/22 1446  Test: Lactic Acid Critical Value: 2.6  Name of Provider Notified: Tat  Orders Received? Or Actions Taken?: Bolus of Lactated ringers

## 2022-04-26 NOTE — Assessment & Plan Note (Signed)
Holding amlodipine secondary to soft blood pressure Continue metoprolol heart rate at lower dose

## 2022-04-26 NOTE — Progress Notes (Addendum)
PROGRESS NOTE  Heather [name] JOA:[id number] DOB: [birth year] DOA: 04/25/2022 PCP: Glenda Chroman, MD  Brief History:  88 year old female with history of hypertension, hyperlipidemia, anxiety, GERD, IBS presenting with abdominal pain in the lower abdomen.  Notably, the patient was seen at [facility] ED on 04/22/2022 with complaints of urinary frequency and lower abdominal pain and low back pain.  At that time, the patient stated that she was unable to stop urinating.   CT of the abdomen and pelvis was obtained during that ED visit and showed no acute process.  Specifically the CT also showed L2 compression fracture I was present from CT earlier in the month.  There was diverticulosis without any diverticulitis and bilateral inguinal hernias without incarceration.  The patient was given IV morphine, Zofran, and 1 L of IV fluids.  With improvement of her pain.  During that ED's visit, WBC was 12.0 with hemoglobin 16.6 and platelets 246,000.  Her BMP was unremarkable with sodium 140, potassium 3.5, bicarbonate 29, and serum creatinine 0.86.  UA was negative for any pyuria or hematuria at that time.  She was discharged home in stable condition.She complained of sharp pains in her lower abdomen and radiating to her back without any fevers or chills.  Further history reveals that the patient has been struggling with constipation.  She states that she had not had a bowel movement for about 2 weeks.  As result, she had noted increasing abdominal pain over that period of time.  She denies any hematochezia, melena, nausea, vomiting.  There is no dysuria, but she has complained of urinary frequency.  There is no hematuria.  She denies any fevers or chills.  She stated that the pain was primarily in the lower abdomen right greater than left. In addition, the patient states that she has had 3 falls in the past month, most recently about 2 weeks prior to this admission.  There is no syncope.  Apparently she was in SNF  rehab about 2 months ago. ED Upon admission to the emergency department patient was seen to be afebrile with O2 saturations as low as 88% with improvement on 2 L nasal cannula oxygen.  Labs noted WBC 10.5, hemoglobin 16.4, high-sensitivity troponins negative x2, and D-dimer 1.87.  Urinalysis did not show any significant signs of infection.  CT angiogram of the chest abdomen pelvis noted new L1 compression fracture with chronic L2 compression fracture, distention of the cecum without signs of infection.     Assessment and Plan: * Closed compression fracture of body of L1 vertebra (HCC) Noted on CTA abdomen -Secondary to mechanical falls -Judicious opioids -PT evaluation -CT also noted previous compression fractures T12 and L2.  Acute respiratory failure with hypoxia (HCC) Oxygen saturation 86% on room air with shortness of breath. 730 CTA chest-- negative for PE--shows bibasilar atelectasis.  There is no consolidation.  There is GGO in the left upper lobe. -Check PCT -Suspect possible component of atelectatic disease versus possible infectious process -Viral respiratory panel -Stable on 2 L presently  Constipation Abdominal pain seems to be improving after bowel movements here in the hospital -Continue MiraLAX and Senokot  Essential hypertension Holding amlodipine secondary to soft blood pressure Continue metoprolol heart rate at lower dose  Anxiety PDMP reviewed -- Patient receives Xanax 1 mg, #60, last refilled 04/06/2022  GERD (gastroesophageal reflux disease) Continue pantoprazole  Chronic back pain PDMP reviewed -- Patient receives tramadol # 64, last refilled 03/30/2022 -Continue  gabapentin -Also previously had a prescription for hydrocodone 5/325, #20 on 02/09/2022  Abnormal CT of the abdomen 05/22/2022 CT abdomen--marked distention of the cecum extending to the hepatic flexure and transverse colon with air-fluid levels -Suspect this may be due to ileus from the  patient's constipation -She has had 3 bowel movements in the last 24 hours since arrival to [facility]>>had 2 episodes small volume hematochezia -Request GI evaluation -UA negative for pyuria  Chest pain Atypical by clinical history -CTA chest negative for PE -Reproducible on exam by palpation -Echocardiogram -Troponin 3>>> 3 -Personally reviewed EKG--sinus rhythm, nonspecific T wave change      Family Communication:  no  Family at bedside  Consultants:  GI  Code Status:  FULL  DVT Prophylaxis:  Ulysses Lovenox   Procedures: As Listed in Progress Note Above  Antibiotics: None     Subjective: Patient states that abdominal pain is improving after bowel movements.  She denies any nausea, vomiting, diarrhea.  She has 3 bowel movement since arrival.  Chest pain is improved.  She has some shortness of breath.  There is no coughing or hemoptysis.  She has never smoked.  Objective: Vitals:   04/26/22 0000 04/26/22 0424 04/26/22 0824 04/26/22 0907  BP: 96/70 129/62  (!) 150/69  Pulse: 70 94  84  Resp: '18 20  20  '$ Temp: 98.2 F (36.8 C) 98.1 F (36.7 C)  97.6 F (36.4 C)  TempSrc: Oral Oral  Oral  SpO2: 96% (!) 88% 95% 95%  Weight:      Height:        Intake/Output Summary (Last 24 hours) at 04/26/2022 1341 Last data filed at 04/26/2022 0310 Gross per 24 hour  Intake 1098.11 ml  Output --  Net 1098.11 ml   Weight change:  Exam:  General:  Pt is alert, follows commands appropriately, not in acute distress HEENT: No icterus, No thrush, No neck mass, [place]/AT Cardiovascular: RRR, S1/S2, no rubs, no gallops Respiratory: Bibasilar crackles.  No wheezing.  Good air movement. Abdomen: Soft/+BS, RLQ tender, non distended, no guarding Extremities: No edema, No lymphangitis, No petechiae, No rashes, no synovitis   Data Reviewed: I have personally reviewed following labs and imaging studies Basic Metabolic Panel: Recent Labs  Lab 04/25/22 0902 04/26/22 0445  NA 137 136  K 3.7  3.9  CL 100 99  CO2 28 27  GLUCOSE 100* 129*  BUN 19 19  CREATININE 0.82 0.97  CALCIUM 9.5 8.9  MG 2.4  --    Liver Function Tests: Recent Labs  Lab 04/25/22 0902  AST 22  ALT 21  ALKPHOS 94  BILITOT 0.5  PROT 8.0  ALBUMIN 4.0   Recent Labs  Lab 04/25/22 0902  LIPASE 26   No results for input(s): "AMMONIA" in the last 168 hours. Coagulation Profile: No results for input(s): "INR", "PROTIME" in the last 168 hours. CBC: Recent Labs  Lab 04/25/22 0902 04/26/22 0445  WBC 10.5 12.0*  NEUTROABS 6.6  --   HGB 16.4* 15.1*  HCT 48.8* 45.6  MCV 88.2 89.4  PLT 208 194   Cardiac Enzymes: No results for input(s): "CKTOTAL", "CKMB", "CKMBINDEX", "TROPONINI" in the last 168 hours. BNP: Invalid input(s): "POCBNP" CBG: No results for input(s): "GLUCAP" in the last 168 hours. HbA1C: No results for input(s): "HGBA1C" in the last 72 hours. Urine analysis:    Component Value Date/Time   COLORURINE YELLOW 04/25/2022 0852   APPEARANCEUR HAZY (A) 04/25/2022 0852   LABSPEC 1.017 04/25/2022 1443  PHURINE 7.0 04/25/2022 0852   GLUCOSEU NEGATIVE 04/25/2022 0852   HGBUR NEGATIVE 04/25/2022 0852   BILIRUBINUR NEGATIVE 04/25/2022 White 04/25/2022 0852   PROTEINUR NEGATIVE 04/25/2022 0852   NITRITE NEGATIVE 04/25/2022 0852   LEUKOCYTESUR NEGATIVE 04/25/2022 0852   Sepsis Labs: '@LABRCNTIP'$ (procalcitonin:4,lacticidven:4) )No results found for this or any previous visit (from the past 240 hour(s)).   Scheduled Meds:  aspirin EC  81 mg Oral Q breakfast   enoxaparin (LOVENOX) injection  40 mg Subcutaneous Q24H   fluticasone furoate-vilanterol  1 puff Inhalation Daily   gabapentin  300 mg Oral Daily   isosorbide mononitrate  60 mg Oral Daily   lidocaine  1 patch Transdermal Q24H   metoprolol tartrate  12.5 mg Oral BID   pantoprazole  80 mg Oral Daily   polyethylene glycol  17 g Oral Daily   senna-docusate  1 tablet Oral BID   sodium chloride flush  3 mL  Intravenous Q12H   Continuous Infusions:  lactated ringers Stopped (04/26/22 0204)    Procedures/Studies: CT Angio Chest PE W and/or Wo Contrast  Result Date: 04/25/2022 CLINICAL DATA:  Pulmonary embolism (PE) suspected, positive D-dimer; Abdominal pain, acute, nonlocalized EXAM: CT ANGIOGRAPHY CHEST CT ABDOMEN AND PELVIS WITH CONTRAST TECHNIQUE: Multidetector CT imaging of the chest was performed using the standard protocol during bolus administration of intravenous contrast. Multiplanar CT image reconstructions and MIPs were obtained to evaluate the vascular anatomy. Multidetector CT imaging of the abdomen and pelvis was performed using the standard protocol during bolus administration of intravenous contrast. RADIATION DOSE REDUCTION: This exam was performed according to the departmental dose-optimization program which includes automated exposure control, adjustment of the mA and/or kV according to patient size and/or use of iterative reconstruction technique. CONTRAST:  140m OMNIPAQUE IOHEXOL 350 MG/ML SOLN COMPARISON:  CT abdomen pelvis 04/22/2022. FINDINGS: CTA CHEST FINDINGS Cardiovascular: Satisfactory opacification of the pulmonary arteries to the segmental level. No evidence of pulmonary embolism. Mild cardiomegaly.No pericardial disease. Coronary artery atherosclerosis. Moderate atherosclerosis of thoracic aorta. Mediastinum/Nodes: No lymphadenopathy. There is a 1.6 cm heterogeneous left thyroid nodule. Small hiatal hernia. Lungs/Pleura: Bibasilar hypoventilatory changes and atelectasis. No focal airspace consolidation. There is a 4 mm ground-glass nodule in the left upper lobe (series 5, image 58). No follow-up imaging is recommended ([employer]ociety 2017; Radiology 2017; [phone]. Musculoskeletal: There is a chronic T12 compression deformity with prior vertebroplasty. Review of the MIP images confirms the above findings. CT ABDOMEN and PELVIS FINDINGS Hepatobiliary: No focal liver  abnormality is seen. The gallbladder is unremarkable. Pancreas: Unremarkable. No pancreatic ductal dilatation or surrounding inflammatory changes. Spleen: Normal in size without focal abnormality. Adrenals/Urinary Tract: Adrenal glands are unremarkable. No hydronephrosis or nephrolithiasis. There are tiny bilateral renal cysts. The bladder is moderately distended. Stomach/Bowel: Small hiatal hernia. The stomach is otherwise within normal limits. There is no evidence of bowel obstruction.The appendix is normal. There is marked distension of the cecum and to a lesser degree the a hepatic flexure and transverse colon with air-fluid and fat fluid levels. There is no adjacent stranding.Colonic diverticulosis. No diverticulitis. Vascular/Lymphatic: Aortoiliac atherosclerosis. No AAA. No lymphadenopathy. Reproductive: Unremarkable. Other: Diastasis recti. Bilateral fat containing inguinal hernias. No bowel containing hernia. No ascites. No free air. Musculoskeletal: No acute osseous abnormality. No suspicious osseous lesion. Unchanged compression fracture of L2. No progressive height loss. Unchanged L4 vertebral body hemangioma. There is a new superior endplate compression fracture of L1 with approximately 15% height loss. Unchanged multilevel degenerative disc disease most severe  at L5-S1 with trace retrolisthesis. Unchanged grade 1 anterolisthesis at L4-L5. Mild bilateral hip osteoarthritis. Review of the MIP images confirms the above findings. IMPRESSION: CT CHEST: No evidence of pulmonary embolism.  No focal airspace disease. 1.6 cm left thyroid nodule. If not previously performed, recommend non-emergent thyroid ultrasound as an outpatient. Small hiatal hernia. CT ABDOMEN AND PELVIS: Distension of the cecum and to a lesser degree of the hepatic flexure and transverse colon with air-fluid and fat-fluid levels. No wall thickening or adjacent stranding to suggest colitis, however this could potentially be related the  patient's pain/discomfort. Diverticulosis without diverticulitis. Normal appendix. No bowel obstruction. New superior endplate compression fracture of L1 with approximately 15 percent height loss. Unchanged T12 and L2 compression deformities. Electronically Signed   By: Maurine Simmering M.D.   On: 04/25/2022 12:01   CT ABDOMEN PELVIS W CONTRAST  Result Date: 04/25/2022 CLINICAL DATA:  Pulmonary embolism (PE) suspected, positive D-dimer; Abdominal pain, acute, nonlocalized EXAM: CT ANGIOGRAPHY CHEST CT ABDOMEN AND PELVIS WITH CONTRAST TECHNIQUE: Multidetector CT imaging of the chest was performed using the standard protocol during bolus administration of intravenous contrast. Multiplanar CT image reconstructions and MIPs were obtained to evaluate the vascular anatomy. Multidetector CT imaging of the abdomen and pelvis was performed using the standard protocol during bolus administration of intravenous contrast. RADIATION DOSE REDUCTION: This exam was performed according to the departmental dose-optimization program which includes automated exposure control, adjustment of the mA and/or kV according to patient size and/or use of iterative reconstruction technique. CONTRAST:  177m OMNIPAQUE IOHEXOL 350 MG/ML SOLN COMPARISON:  CT abdomen pelvis 04/22/2022. FINDINGS: CTA CHEST FINDINGS Cardiovascular: Satisfactory opacification of the pulmonary arteries to the segmental level. No evidence of pulmonary embolism. Mild cardiomegaly.No pericardial disease. Coronary artery atherosclerosis. Moderate atherosclerosis of thoracic aorta. Mediastinum/Nodes: No lymphadenopathy. There is a 1.6 cm heterogeneous left thyroid nodule. Small hiatal hernia. Lungs/Pleura: Bibasilar hypoventilatory changes and atelectasis. No focal airspace consolidation. There is a 4 mm ground-glass nodule in the left upper lobe (series 5, image 58). No follow-up imaging is recommended ([employer]ociety 2017; Radiology 2017; [id number]. Musculoskeletal:  There is a chronic T12 compression deformity with prior vertebroplasty. Review of the MIP images confirms the above findings. CT ABDOMEN and PELVIS FINDINGS Hepatobiliary: No focal liver abnormality is seen. The gallbladder is unremarkable. Pancreas: Unremarkable. No pancreatic ductal dilatation or surrounding inflammatory changes. Spleen: Normal in size without focal abnormality. Adrenals/Urinary Tract: Adrenal glands are unremarkable. No hydronephrosis or nephrolithiasis. There are tiny bilateral renal cysts. The bladder is moderately distended. Stomach/Bowel: Small hiatal hernia. The stomach is otherwise within normal limits. There is no evidence of bowel obstruction.The appendix is normal. There is marked distension of the cecum and to a lesser degree the a hepatic flexure and transverse colon with air-fluid and fat fluid levels. There is no adjacent stranding.Colonic diverticulosis. No diverticulitis. Vascular/Lymphatic: Aortoiliac atherosclerosis. No AAA. No lymphadenopathy. Reproductive: Unremarkable. Other: Diastasis recti. Bilateral fat containing inguinal hernias. No bowel containing hernia. No ascites. No free air. Musculoskeletal: No acute osseous abnormality. No suspicious osseous lesion. Unchanged compression fracture of L2. No progressive height loss. Unchanged L4 vertebral body hemangioma. There is a new superior endplate compression fracture of L1 with approximately 15% height loss. Unchanged multilevel degenerative disc disease most severe at L5-S1 with trace retrolisthesis. Unchanged grade 1 anterolisthesis at L4-L5. Mild bilateral hip osteoarthritis. Review of the MIP images confirms the above findings. IMPRESSION: CT CHEST: No evidence of pulmonary embolism.  No focal airspace disease. 1.6 cm left thyroid  nodule. If not previously performed, recommend non-emergent thyroid ultrasound as an outpatient. Small hiatal hernia. CT ABDOMEN AND PELVIS: Distension of the cecum and to a lesser degree of  the hepatic flexure and transverse colon with air-fluid and fat-fluid levels. No wall thickening or adjacent stranding to suggest colitis, however this could potentially be related the patient's pain/discomfort. Diverticulosis without diverticulitis. Normal appendix. No bowel obstruction. New superior endplate compression fracture of L1 with approximately 15 percent height loss. Unchanged T12 and L2 compression deformities. Electronically Signed   By: Maurine Simmering M.D.   On: 04/25/2022 12:01   MR Lumbar Spine W Wo Contrast  Result Date: 04/25/2022 CLINICAL DATA:  Provided history: Low back pain, cauda equina syndrome suspected. EXAM: MRI LUMBAR SPINE WITHOUT AND WITH CONTRAST TECHNIQUE: Multiplanar and multiecho pulse sequences of the lumbar spine were obtained without and with intravenous contrast. CONTRAST:  43m GADAVIST GADOBUTROL 1 MMOL/ML IV SOLN COMPARISON:  CT abdomen/pelvis 04/22/2022. CT abdomen/pelvis 03/31/2019. Lumbar spine MRI 12/20/2021. FINDINGS: Segmentation: 5 lumbar vertebrae. The caudal most well-formed intervertebral disc space is designated L5-S1. Alignment: 2 mm grade 1 retrolisthesis at L1-L2. Unchanged 4 mm bony retropulsion at the level of the L4 inferior endplate. 3 mm L4-L5 grade 1 anterolisthesis. 3 mm L5-S1 grade 1 retrolisthesis. Vertebrae: Redemonstrated chronic T12 vertebral compression fracture (40-50% height loss) post vertebral augmentation. Acute L1 superior endplate vertebral compression fracture (approximately 20% height loss). Redemonstrated subacute on chronic L2 inferior endplate compression fracture. Approximately 30% height loss at this level, unchanged from the prior CT abdomen/pelvis of 04/22/2022. 4 mm bony retropulsion at the level of the L2 inferior endplate, also unchanged. Marrow edema within the L1 vertebral body, greatest along the superior endplate. Marrow edema within the L2 vertebral body, greatest along the inferior endplate. Mild superior endplate edema also  present at L3. There is an unchanged small Schmorl node within the right aspect of the L3 superior endplate without convincing superior endplate compression deformity. Marrow edema within the posterior elements at L3-L4, likely degenerative and related to facet arthrosis. Redemonstrated large L4 vertebral body hemangioma. Conus medullaris and cauda equina: Conus extends to the L2 level. No signal abnormality within the visualized distal spinal cord. Paraspinal and other soft tissues: Bilateral renal cysts. No paraspinal mass or collection. Disc levels: Unless otherwise stated, the level by level findings below have not significantly changed from the prior MRI of 12/20/2021. Multilevel disc degeneration, greatest at L1-L2 (moderate), L2-L3 (moderate) and L5-S1 (advanced). T11-T12: This level is imaged in the sagittal plane only. Disc bulge. Mild facet arthrosis. The disc bulge mildly effaces the ventral thecal sac and contacts the ventral aspect of the spinal cord. No significant foraminal stenosis. T12-L1: Disc bulge. Facet arthrosis (mild right, moderate left). Ligamentum flavum hypertrophy on the left. No significant spinal canal or foraminal stenosis. L1-L2: 2 mm grade 1 retrolisthesis. Disc bulge. Mild-to-moderate facet arthrosis. Mild to moderate bilateral subarticular narrowing (without appreciable nerve root impingement). Mild narrowing of the central canal. Mild neural foraminal narrowing on the right. L2-L3: 4 mm bony retropulsion at the level of the L4 inferior endplate, new from the prior MRI of 12/20/2021. Progressive disc bulge. Progressive advanced facet arthrosis with ligamentum flavum hypertrophy. Progressive severe central canal stenosis with bilateral subarticular narrowing. Bilateral neural foraminal narrowing (moderate/severe, mild/moderate left), progressed. L3-L4: Disc bulge. Moderate facet arthrosis with ligamentum flavum hypertrophy. Mild right subarticular narrowing (without appreciable  nerve root impingement). Mild relative narrowing of the central canal. Mild right neural foraminal narrowing. L4-L5: 3 mm grade  1 anterolisthesis. Disc uncovering with disc bulge. Advanced facet arthrosis with ligamentum flavum hypertrophy. Bilateral subarticular narrowing (mild-to-moderate right, mild left) with medialization of the descending right L5 nerve root. Moderate central canal stenosis. Minimal relative left neural foraminal narrowing. L5-S1: 3 mm grade 1 retrolisthesis. Disc bulge with endplate osteophytes. Right center/subarticular posterior annular fissure. Facet arthrosis (mild right, moderate left) with ligamentum flavum hypertrophy. Right subarticular stenosis with impingement of the descending right S1 nerve root. Mild left subarticular narrowing (without appreciable nerve root impingement at this site). No significant central canal stenosis. Bilateral neural foraminal narrowing (mild right, moderate left). IMPRESSION: Acute L1 superior endplate vertebral compression fracture (approximately 20% height loss). No significant bony retropulsion at this level. Subacute on chronic L2 inferior endplate vertebral compression fracture. Approximately 30% height loss at this level, unchanged from the recent prior CT abdomen/pelvis of 04/22/2022. At L2-L3, 4 mm bony retropulsion at the level of the L2 inferior endplate and superimposed spondylosis contribute to progressive severe central canal stenosis with bilateral subarticular narrowing. Bilateral neural foraminal narrowing (moderate/severe right, mild-to-moderate left), also progressed. Mild marrow edema along the L3 superior endplate. Unchanged small Schmorl node within the right aspect of the L3 superior endplate, but no convincing L3 superior endplate compression fracture. Unchanged chronic T12 vertebral compression fracture status post vertebral augmentation. Additional lumbar spondylosis, as outlined and otherwise unchanged from the prior MRI of  12/20/2021. At these remaining levels, spondylosis is greatest at L1-L2, L4-L5 and L5-S1. Electronically Signed   By: Kellie Simmering D.O.   On: 04/25/2022 11:01   DG INJECT DIAG/THERA/INC NEEDLE/CATH/PLC EPI/LUMB/SAC W/IMG  Result Date: 04/06/2022 CLINICAL DATA:  Lumbosacral spondylosis without myelopathy. Moderate spinal stenosis at L4-5. Sacrococcygeal and right leg pain. The patient also has a subacute L2 compression fracture, however she denies significant upper lumbar back pain. FLUOROSCOPY: Radiation Exposure Index (as provided by the fluoroscopic device): 4.50 mGy Kerma PROCEDURE: The procedure, risks, benefits, and alternatives were explained to the patient. Questions regarding the procedure were encouraged and answered. The patient understands and consents to the procedure. LUMBAR EPIDURAL INJECTION: An interlaminar approach was performed on the right at L5-S1. The overlying skin was cleansed and anesthetized. A 3.5 inch 20 gauge epidural needle was advanced using loss-of-resistance technique. DIAGNOSTIC EPIDURAL INJECTION: Injection of Isovue-M 200 shows a good epidural pattern with spread above and below the level of needle placement, primarily on the right. No vascular opacification is seen. THERAPEUTIC EPIDURAL INJECTION: 80 mg of Depo-Medrol mixed with 2 mL of 1% lidocaine were instilled. The injection reproduced the patient's sacrococcygeal and right leg pain. The procedure was well-tolerated, and the patient was discharged thirty minutes following the injection in good condition. COMPLICATIONS: None immediate IMPRESSION: Technically successful interlaminar epidural injection on the right at L5-S1. Electronically Signed   By: Logan Bores M.D.   On: 04/06/2022 13:29   CT Renal Stone Study  Result Date: 03/30/2022 CLINICAL DATA:  Flank pain with kidney stone suspected. Constipation for 3 days EXAM: CT ABDOMEN AND PELVIS WITHOUT CONTRAST TECHNIQUE: Multidetector CT imaging of the abdomen and pelvis  was performed following the standard protocol without IV contrast. RADIATION DOSE REDUCTION: This exam was performed according to the departmental dose-optimization program which includes automated exposure control, adjustment of the mA and/or kV according to patient size and/or use of iterative reconstruction technique. COMPARISON:  01/27/2022 FINDINGS: Lower chest:  Small sliding hiatal hernia Hepatobiliary: Prominent liver fissures and caudate lobe size but no definite surface lobulation for cirrhosis.No evidence of biliary obstruction or  stone. Pancreas: Generalized atrophy Spleen: Unremarkable. Adrenals/Urinary Tract: Negative adrenals. No hydronephrosis or stone. Unremarkable bladder. Stomach/Bowel: No obstruction. Left colonic diverticulosis. No visible bowel inflammation. Vascular/Lymphatic: Atheromatous calcification of the aorta and iliacs. No mass or adenopathy. Reproductive:No pathologic findings. 13 mm simple appearing right ovarian cyst. No follow-up imaging recommended. Note: This recommendation does not apply to premenarchal patients and to those with increased risk (genetic, family history, elevated tumor markers or other high-risk factors) of ovarian cancer. Reference: JACR 2020 Feb; 17(2):248-254 Other: No ascites or pneumoperitoneum. Homogeneous nodule along the posterior aspect of the right diaphragm, benign given multi year stability Musculoskeletal: L2 compression fracture with inferior endplate depression and sclerosis. Fracture lines are still readily apparent and there is mild paravertebral edema. Some callus is likely present and there is vacuum phenomenon in the adjacent disc space. Remote T12 compression fracture with cement augmentation. Generalized osteopenia and lumbar spine degeneration with mild L4-5 anterolisthesis. Sizable hemangioma at the L4 body. IMPRESSION: 1. Subacute/un-healed L2 compression fracture with mild height loss. 2. No acute intra-abdominal finding. 3.  Atherosclerosis and colonic diverticulosis. Electronically Signed   By: Jorje Guild M.D.   On: 03/30/2022 05:27    Orson Eva, DO  [facility]  If 7PM-7AM, please contact night-coverage [email or web address] Password [email or web address] 04/26/2022, 1:41 PM   LOS: 0 days

## 2022-04-26 NOTE — Hospital Course (Addendum)
88 year old female with history of hypertension, hyperlipidemia, anxiety, GERD, IBS presenting with abdominal pain in the lower abdomen.  Notably, the patient was seen at [facility] ED on 04/22/2022 with complaints of urinary frequency and lower abdominal pain and low back pain.  At that time, the patient stated that she was unable to stop urinating.   CT of the abdomen and pelvis was obtained during that ED visit and showed no acute process.  Specifically the CT also showed L2 compression fracture I was present from CT earlier in the month.  There was diverticulosis without any diverticulitis and bilateral inguinal hernias without incarceration.  The patient was given IV morphine, Zofran, and 1 L of IV fluids.  With improvement of her pain.  During that ED's visit, WBC was 12.0 with hemoglobin 16.6 and platelets 246,000.  Her BMP was unremarkable with sodium 140, potassium 3.5, bicarbonate 29, and serum creatinine 0.86.  UA was negative for any pyuria or hematuria at that time.  She was discharged home in stable condition.She complained of sharp pains in her lower abdomen and radiating to her back without any fevers or chills.  Further history reveals that the patient has been struggling with constipation.  She states that she had not had a bowel movement for about 2 weeks.  As result, she had noted increasing abdominal pain over that period of time.  She denies any hematochezia, melena, nausea, vomiting.  There is no dysuria, but she has complained of urinary frequency.  There is no hematuria.  She denies any fevers or chills.  She stated that the pain was primarily in the lower abdomen right greater than left. In addition, the patient states that she has had 3 falls in the past month, most recently about 2 weeks prior to this admission.  There is no syncope.  Apparently she was in SNF rehab about 2 months ago. ED Upon admission to the emergency department patient was seen to be afebrile with O2 saturations as low as  88% with improvement on 2 L nasal cannula oxygen.  Labs noted WBC 10.5, hemoglobin 16.4, high-sensitivity troponins negative x2, and D-dimer 1.87.  Urinalysis did not show any significant signs of infection.  CT angiogram of the chest abdomen pelvis noted new L1 compression fracture with chronic L2 compression fracture, distention of the cecum without signs of infection.

## 2022-04-26 NOTE — Assessment & Plan Note (Signed)
Noted on CTA abdomen -Secondary to mechanical falls -Judicious opioids -PT evaluation -CT also noted previous compression fractures T12 and L2.

## 2022-04-26 NOTE — Consult Note (Signed)
Consulting  Provider: Dr. Carles Collet Primary Care Physician:  Glenda Chroman, MD Primary Gastroenterologist:  Dr. Jenetta Downer  Reason for Consultation: Abnormal CT, abdominal pain  HPI:  Heather [name] is a 88 y.o. female with a past medical history of IBS-C, hypertension, Heather [name], who presented to [facility] ER yesterday evening with chief complaints of worsening abdominal pain distention.  Pain is in the periumbilical area radiates to her back.  Also notes chest discomfort, and constipation x3 to 4 days.  Patient previously sustained a compression fracture of her lumbar spine in February for which she was sent to rehab.  Work-up in the ER including CT angiogram of the chest abdomen pelvis noted new L1 compression fracture with chronic L2 compression fracture.  In the abdomen noted distention of the cecum (7 cm) and to a lesser degree hepatic flexure and transverse colon , no wall thickening or adjacent stranding to suggest colitis.  Today, patient s continues to complain of abdominal pain and distention.  She does note she had a very large bowel movement overnight stating "it was everywhere."  Also passing flatus.  States this is helped her pain some though she continues to complain.  No melena hematochezia.  Last Colonoscopy:08/22/12 moderate diverticulosis in sigmoid, two polyps in sigmoid (tubular adenoma)   Last Endoscopy:04/09/20- Normal hypopharynx. - Normal esophagus. - Z-line irregular, 38 cm from the incisors. - Erythematous mucosa in the antrum and prepyloric region of the stomach. (Gastric antral mucosa with non specific reactive gastropathy. - Normal duodenal bulb and second portion of the duodenum.  Past Medical History:  Diagnosis Date   Anxiety    GERD (gastroesophageal reflux disease)    Hx of shoulder surgery    Left Shoulder - pitched nerve.   Other and unspecified hyperlipidemia    Unspecified essential hypertension     Past Surgical History:  Procedure Laterality Date    BIOPSY  04/09/2020   Procedure: BIOPSY;  Surgeon: Rogene Houston, MD;  Location: [facility];  Service: Endoscopy;;   CERVICAL [place]   colonscopy  08/22/2012   moderate diverticulosos in sigmoid and two tubular adenomas   ESOPHAGOGASTRODUODENOSCOPY N/A 04/09/2020   [facility]:Normal hypopharynx.normal esophagus, z line irreg, 38 cm from incisors, erythematous mucosa in antrum and preplyoric region, gastric antral mucosa with non specific reactive gastropathy, normal duodenal bul and second portion of duodenum   History of shoulder surgery Left    pitched nerve.    Prior to Admission medications   Medication Sig Start Date End Date Taking? Authorizing Provider  acetaminophen (TYLENOL) 500 MG tablet Take 500 mg by mouth every 4 (four) hours as needed for mild pain, moderate pain or fever.   Yes [provider]  albuterol (VENTOLIN HFA) 108 (90 Base) MCG/ACT inhaler Inhale 2 puffs into the lungs every 4 (four) hours as needed for wheezing or shortness of breath. 01/28/22  Yes Emokpae, Courage, MD  ALPRAZolam Duanne Moron) 1 MG tablet Take 1 tablet (1 mg total) by mouth 3 (three) times daily. 01/28/22  Yes Roxan Hockey, MD  alum & mag hydroxide-simeth (MAALOX/MYLANTA) 200-200-20 MG/5ML suspension Take 30 mLs by mouth every 6 (six) hours as needed for indigestion or heartburn.   Yes [provider]  amLODipine (NORVASC) 10 MG tablet Take 10 mg by mouth every morning.  07/14/17  Yes [provider]  aspirin 81 MG tablet Take 1 tablet (81 mg total) by mouth daily with breakfast. 01/28/22  Yes Emokpae,  Courage, MD  Calcium-Magnesium-Zinc (CAL-MAG-ZINC PO) Take 1 tablet by mouth daily.   Yes [provider]  Diclofenac Sodium 3 % GEL Apply 1-2 g topically in the morning, at noon, and at bedtime.   Yes [provider]  dicyclomine (BENTYL) 10 MG capsule TAKE ONE CAPSULE BY MOUTH FOUR TIMES DAILY BEFORE MEALS and AT BEDTIME Patient  taking differently: Take 10 mg by mouth daily at 6 (six) AM. 12/21/21  Yes Montez Morita, Daniel, MD  ferrous sulfate 325 (65 FE) MG tablet Take 325 mg by mouth daily with breakfast.   Yes [provider]  fluticasone furoate-vilanterol (BREO ELLIPTA) 100-25 MCG/ACT AEPB Inhale 1 puff into the lungs daily. 03/04/20  Yes [provider]  furosemide (LASIX) 40 MG tablet Take 40 mg by mouth daily.   Yes [provider]  gabapentin (NEURONTIN) 300 MG capsule Take 300 mg by mouth daily. 01/17/22  Yes [provider]  HYDROcodone-acetaminophen (NORCO/VICODIN) 5-325 MG tablet Take 1 tablet by mouth every 12 (twelve) hours as needed for moderate pain. 02/17/22  Yes [provider]  isosorbide mononitrate (IMDUR) 60 MG 24 hr tablet Take 60 mg by mouth daily. 01/17/22  Yes [provider]  lactulose (CHRONULAC) 10 GM/15ML solution Take 30 mLs (20 g total) by mouth 2 (two) times daily. Goal is to Have at least 2 mushy/Non-solid bowel movements Each day-- May give 1 additional dose of lactulose if no bowel movement in 24 hr period Patient taking differently: Take 20 g by mouth 2 (two) times daily. 01/28/22  Yes Emokpae, Courage, MD  metoprolol tartrate (LOPRESSOR) 25 MG tablet Take 25 mg by mouth 2 (two) times daily. 07/10/18  Yes [provider]  MILK OF MAGNESIA 400 MG/5ML suspension Take 30 mLs by mouth at bedtime as needed for indigestion or heartburn. 02/17/22  Yes [provider]  Naphazoline-Pheniramine (OPCON-A) 0.027-0.315 % SOLN Place 1 drop into both eyes 2 (two) times daily as needed (dry eyes).   Yes [provider]  nitroGLYCERIN (NITROSTAT) 0.4 MG SL tablet Place 0.4 mg under the tongue every 5 (five) minutes as needed for chest pain.   Yes [provider]  omeprazole (PRILOSEC) 40 MG capsule Take 40 mg by mouth daily.   Yes [provider]  potassium chloride (KLOR-CON) 10 MEQ tablet Take 1 tablet (10 mEq  total) by mouth daily. Take While taking Lactulose 01/28/22  Yes Emokpae, Courage, MD  sucralfate (CARAFATE) 1 g tablet Take 1 g by mouth 4 (four) times daily. 01/17/22  Yes [provider]  traMADol (ULTRAM) 50 MG tablet Take 50 mg by mouth 2 (two) times daily. 03/04/22  Yes [provider]    Current Facility-Administered Medications  Medication Dose Route Frequency Provider Last Rate Last Admin   acetaminophen (TYLENOL) tablet 650 mg  650 mg Oral Q6H PRN Norval Morton, MD       Or   acetaminophen (TYLENOL) suppository 650 mg  650 mg Rectal Q6H PRN Fuller Plan A, MD       albuterol (PROVENTIL) (2.5 MG/3ML) 0.083% nebulizer solution 2.5 mg  2.5 mg Nebulization Q4H PRN Fuller Plan A, MD       ALPRAZolam Duanne Moron) tablet 1 mg  1 mg Oral BID PRN Fuller Plan A, MD   1 mg at 04/26/22 1155   aspirin EC tablet 81 mg  81 mg Oral Q breakfast Fuller Plan A, MD   81 mg at 04/26/22 0843   enoxaparin (LOVENOX) injection 40  mg  40 mg Subcutaneous Q24H Smith, Rondell A, MD   40 mg at 04/25/22 1831   fluticasone furoate-vilanterol (BREO ELLIPTA) 100-25 MCG/ACT 1 puff  1 puff Inhalation Daily Fuller Plan A, MD   1 puff at 04/26/22 0823   gabapentin (NEURONTIN) capsule 300 mg  300 mg Oral Daily Tamala Julian, Rondell A, MD   300 mg at 04/26/22 0844   isosorbide mononitrate (IMDUR) 24 hr tablet 60 mg  60 mg Oral Daily Tamala Julian, Rondell A, MD   60 mg at 04/26/22 0623   lactated ringers infusion   Intravenous Continuous Fuller Plan A, MD   Stopped at 04/26/22 0204   lidocaine (LIDODERM) 5 % 1 patch  1 patch Transdermal Q24H Fuller Plan A, MD   1 patch at 04/25/22 1454   metoprolol tartrate (LOPRESSOR) tablet 12.5 mg  12.5 mg Oral BID Tat, Shanon Brow, MD   12.5 mg at 04/26/22 0843   morphine (PF) 2 MG/ML injection 2 mg  2 mg Intravenous Q4H PRN Fuller Plan A, MD       ondansetron (ZOFRAN) tablet 4 mg  4 mg Oral Q6H PRN Fuller Plan A, MD       Or   ondansetron (ZOFRAN) injection 4 mg  4 mg  Intravenous Q6H PRN Fuller Plan A, MD       oxyCODONE-acetaminophen (PERCOCET/ROXICET) 5-325 MG per tablet 1 tablet  1 tablet Oral Q4H PRN Fuller Plan A, MD   1 tablet at 04/25/22 2146   pantoprazole (PROTONIX) EC tablet 80 mg  80 mg Oral Daily Smith, Rondell A, MD   80 mg at 04/26/22 0844   polyethylene glycol (MIRALAX / GLYCOLAX) packet 17 g  17 g Oral Daily Tamala Julian, Rondell A, MD   17 g at 04/26/22 0844   senna-docusate (Senokot-S) tablet 1 tablet  1 tablet Oral BID Fuller Plan A, MD   1 tablet at 04/26/22 0859   sodium chloride flush (NS) 0.9 % injection 3 mL  3 mL Intravenous Q12H Fuller Plan A, MD   3 mL at 04/26/22 0847    Allergies as of 04/25/2022   (No Known Allergies)    Family History  Problem Relation Age of Onset   Hypertension Other    Heart attack Sister     Social History   Socioeconomic History   Marital status: Widowed    Spouse name: Not on file   Number of children: Not on file   Years of education: Not on file   Highest education level: Not on file  Occupational History   Not on file  Tobacco Use   Smoking status: Never   Smokeless tobacco: Never  Vaping Use   Vaping Use: Never used  Substance and Sexual Activity   Alcohol use: No    Alcohol/week: 0.0 standard drinks of alcohol   Drug use: No   Sexual activity: Not on file  Other Topics Concern   Not on file  Social History Narrative   Not on file   Social Determinants of Health   Financial Resource Strain: Not on file  Food Insecurity: Not on file  Transportation Needs: Not on file  Physical Activity: Not on file  Stress: Not on file  Social Connections: Not on file  Intimate Partner Violence: Not on file    Review of Systems: General: Negative for anorexia, weight loss, fever, chills, fatigue, weakness. Eyes: Negative for vision changes.  ENT: Negative for hoarseness, difficulty swallowing , nasal congestion. CV: Negative for chest pain, angina,  palpitations, dyspnea on  exertion, peripheral edema.  Respiratory: Negative for dyspnea at rest, dyspnea on exertion, cough, sputum, wheezing.  GI: See history of present illness. GU:  Negative for dysuria, hematuria, urinary incontinence, urinary frequency, nocturnal urination.  MS: Negative for joint pain, low back pain.  Derm: Negative for rash or itching.  Neuro: Negative for weakness, abnormal sensation, seizure, frequent headaches, memory loss, confusion.  Psych: Negative for anxiety, depression Endo: Negative for unusual weight change.  Heme: Negative for bruising or bleeding. Allergy: Negative for rash or hives.  Physical Exam: Vital signs in last 24 hours: Temp:  [97.6 F (36.4 C)-98.2 F (36.8 C)] 97.6 F (36.4 C) (07/04 0907) Pulse Rate:  [57-94] 84 (07/04 0907) Resp:  [14-20] 20 (07/04 0907) BP: (96-150)/(56-70) 150/69 (07/04 0907) SpO2:  [85 %-99 %] 95 % (07/04 0907) Weight:  [77.9 kg] 77.9 kg (07/03 1954) Last BM Date : 04/26/22 General:   Alert,  Well-developed, well-nourished, pleasant and cooperative in NAD Head:  Normocephalic and atraumatic. Eyes:  Sclera clear, no icterus.   Conjunctiva pink. Ears:  Normal auditory acuity. Nose:  No deformity, discharge,  or lesions. Mouth:  No deformity or lesions, dentition normal. Neck:  Supple; no masses or thyromegaly. Lungs:  Clear throughout to auscultation.   No wheezes, crackles, or rhonchi. No acute distress. Heart:  Regular rate and rhythm; no murmurs, clicks, rubs,  or gallops. Abdomen:  Soft, mildly distended, tender to palpation. No masses, hepatosplenomegaly or hernias noted. Normal bowel sounds, without guarding, and without rebound.   Msk:  Symmetrical without gross deformities. Normal posture. Pulses:  Normal pulses noted. Extremities:  Without clubbing or edema. Neurologic:  Alert and  oriented x4;  grossly normal neurologically. Skin:  Intact without significant lesions or rashes. Cervical Nodes:  No significant cervical  adenopathy. Psych:  Alert and cooperative. Normal mood and affect.  Intake/Output from previous day: 07/03 0701 - 07/04 0700 In: 1098.1 [P.O.:240; I.V.:858.1] Out: 13 [Urine:13] Intake/Output this shift: No intake/output data recorded.  Lab Results: Recent Labs    04/25/22 0902 04/26/22 0445  WBC 10.5 12.0*  HGB 16.4* 15.1*  HCT 48.8* 45.6  PLT 208 194   BMET Recent Labs    04/25/22 0902 04/26/22 0445  NA 137 136  K 3.7 3.9  CL 100 99  CO2 28 27  GLUCOSE 100* 129*  BUN 19 19  CREATININE 0.82 0.97  CALCIUM 9.5 8.9   LFT Recent Labs    04/25/22 0902  PROT 8.0  ALBUMIN 4.0  AST 22  ALT 21  ALKPHOS 94  BILITOT 0.5   PT/INR No results for input(s): "LABPROT", "INR" in the last 72 hours. Hepatitis Panel No results for input(s): "HEPBSAG", "HCVAB", "HEPAIGM", "HEPBIGM" in the last 72 hours. C-Diff No results for input(s): "CDIFFTOX" in the last 72 hours.  Studies/Results: CT Angio Chest PE W and/or Wo Contrast  Result Date: 04/25/2022 CLINICAL DATA:  Pulmonary embolism (PE) suspected, positive D-dimer; Abdominal pain, acute, nonlocalized EXAM: CT ANGIOGRAPHY CHEST CT ABDOMEN AND PELVIS WITH CONTRAST TECHNIQUE: Multidetector CT imaging of the chest was performed using the standard protocol during bolus administration of intravenous contrast. Multiplanar CT image reconstructions and MIPs were obtained to evaluate the vascular anatomy. Multidetector CT imaging of the abdomen and pelvis was performed using the standard protocol during bolus administration of intravenous contrast. RADIATION DOSE REDUCTION: This exam was performed according to the departmental dose-optimization program which includes automated exposure control, adjustment of the mA and/or kV according to patient  size and/or use of iterative reconstruction technique. CONTRAST:  168m OMNIPAQUE IOHEXOL 350 MG/ML SOLN COMPARISON:  CT abdomen pelvis 04/22/2022. FINDINGS: CTA CHEST FINDINGS Cardiovascular:  Satisfactory opacification of the pulmonary arteries to the segmental level. No evidence of pulmonary embolism. Mild cardiomegaly.No pericardial disease. Coronary artery atherosclerosis. Moderate atherosclerosis of thoracic aorta. Mediastinum/Nodes: No lymphadenopathy. There is a 1.6 cm heterogeneous left thyroid nodule. Small hiatal hernia. Lungs/Pleura: Bibasilar hypoventilatory changes and atelectasis. No focal airspace consolidation. There is a 4 mm ground-glass nodule in the left upper lobe (series 5, image 58). No follow-up imaging is recommended ([employer] 2017; Radiology 2017; [phone]. Musculoskeletal: There is a chronic T12 compression deformity with prior vertebroplasty. Review of the MIP images confirms the above findings. CT ABDOMEN and PELVIS FINDINGS Hepatobiliary: No focal liver abnormality is seen. The gallbladder is unremarkable. Pancreas: Unremarkable. No pancreatic ductal dilatation or surrounding inflammatory changes. Spleen: Normal in size without focal abnormality. Adrenals/Urinary Tract: Adrenal glands are unremarkable. No hydronephrosis or nephrolithiasis. There are tiny bilateral renal cysts. The bladder is moderately distended. Stomach/Bowel: Small hiatal hernia. The stomach is otherwise within normal limits. There is no evidence of bowel obstruction.The appendix is normal. There is marked distension of the cecum and to a lesser degree the a hepatic flexure and transverse colon with air-fluid and fat fluid levels. There is no adjacent stranding.Colonic diverticulosis. No diverticulitis. Vascular/Lymphatic: Aortoiliac atherosclerosis. No AAA. No lymphadenopathy. Reproductive: Unremarkable. Other: Diastasis recti. Bilateral fat containing inguinal hernias. No bowel containing hernia. No ascites. No free air. Musculoskeletal: No acute osseous abnormality. No suspicious osseous lesion. Unchanged compression fracture of L2. No progressive height loss. Unchanged L4 vertebral body  hemangioma. There is a new superior endplate compression fracture of L1 with approximately 15% height loss. Unchanged multilevel degenerative disc disease most severe at L5-S1 with trace retrolisthesis. Unchanged grade 1 anterolisthesis at L4-L5. Mild bilateral hip osteoarthritis. Review of the MIP images confirms the above findings. IMPRESSION: CT CHEST: No evidence of pulmonary embolism.  No focal airspace disease. 1.6 cm left thyroid nodule. If not previously performed, recommend non-emergent thyroid ultrasound as an outpatient. Small hiatal hernia. CT ABDOMEN AND PELVIS: Distension of the cecum and to a lesser degree of the hepatic flexure and transverse colon with air-fluid and fat-fluid levels. No wall thickening or adjacent stranding to suggest colitis, however this could potentially be related the patient's pain/discomfort. Diverticulosis without diverticulitis. Normal appendix. No bowel obstruction. New superior endplate compression fracture of L1 with approximately 15 percent height loss. Unchanged T12 and L2 compression deformities. Electronically Signed   By: JMaurine SimmeringM.D.   On: 04/25/2022 12:01   CT ABDOMEN PELVIS W CONTRAST  Result Date: 04/25/2022 CLINICAL DATA:  Pulmonary embolism (PE) suspected, positive D-dimer; Abdominal pain, acute, nonlocalized EXAM: CT ANGIOGRAPHY CHEST CT ABDOMEN AND PELVIS WITH CONTRAST TECHNIQUE: Multidetector CT imaging of the chest was performed using the standard protocol during bolus administration of intravenous contrast. Multiplanar CT image reconstructions and MIPs were obtained to evaluate the vascular anatomy. Multidetector CT imaging of the abdomen and pelvis was performed using the standard protocol during bolus administration of intravenous contrast. RADIATION DOSE REDUCTION: This exam was performed according to the departmental dose-optimization program which includes automated exposure control, adjustment of the mA and/or kV according to patient size  and/or use of iterative reconstruction technique. CONTRAST:  1034mOMNIPAQUE IOHEXOL 350 MG/ML SOLN COMPARISON:  CT abdomen pelvis 04/22/2022. FINDINGS: CTA CHEST FINDINGS Cardiovascular: Satisfactory opacification of the pulmonary arteries to the segmental level. No evidence of pulmonary  embolism. Mild cardiomegaly.No pericardial disease. Coronary artery atherosclerosis. Moderate atherosclerosis of thoracic aorta. Mediastinum/Nodes: No lymphadenopathy. There is a 1.6 cm heterogeneous left thyroid nodule. Small hiatal hernia. Lungs/Pleura: Bibasilar hypoventilatory changes and atelectasis. No focal airspace consolidation. There is a 4 mm ground-glass nodule in the left upper lobe (series 5, image 58). No follow-up imaging is recommended [employer] 2017; Radiology 2017; [phone]). Musculoskeletal: There is a chronic T12 compression deformity with prior vertebroplasty. Review of the MIP images confirms the above findings. CT ABDOMEN and PELVIS FINDINGS Hepatobiliary: No focal liver abnormality is seen. The gallbladder is unremarkable. Pancreas: Unremarkable. No pancreatic ductal dilatation or surrounding inflammatory changes. Spleen: Normal in size without focal abnormality. Adrenals/Urinary Tract: Adrenal glands are unremarkable. No hydronephrosis or nephrolithiasis. There are tiny bilateral renal cysts. The bladder is moderately distended. Stomach/Bowel: Small hiatal hernia. The stomach is otherwise within normal limits. There is no evidence of bowel obstruction.The appendix is normal. There is marked distension of the cecum and to a lesser degree the a hepatic flexure and transverse colon with air-fluid and fat fluid levels. There is no adjacent stranding.Colonic diverticulosis. No diverticulitis. Vascular/Lymphatic: Aortoiliac atherosclerosis. No AAA. No lymphadenopathy. Reproductive: Unremarkable. Other: Diastasis recti. Bilateral fat containing inguinal hernias. No bowel containing hernia. No  ascites. No free air. Musculoskeletal: No acute osseous abnormality. No suspicious osseous lesion. Unchanged compression fracture of L2. No progressive height loss. Unchanged L4 vertebral body hemangioma. There is a new superior endplate compression fracture of L1 with approximately 15% height loss. Unchanged multilevel degenerative disc disease most severe at L5-S1 with trace retrolisthesis. Unchanged grade 1 anterolisthesis at L4-L5. Mild bilateral hip osteoarthritis. Review of the MIP images confirms the above findings. IMPRESSION: CT CHEST: No evidence of pulmonary embolism.  No focal airspace disease. 1.6 cm left thyroid nodule. If not previously performed, recommend non-emergent thyroid ultrasound as an outpatient. Small hiatal hernia. CT ABDOMEN AND PELVIS: Distension of the cecum and to a lesser degree of the hepatic flexure and transverse colon with air-fluid and fat-fluid levels. No wall thickening or adjacent stranding to suggest colitis, however this could potentially be related the patient's pain/discomfort. Diverticulosis without diverticulitis. Normal appendix. No bowel obstruction. New superior endplate compression fracture of L1 with approximately 15 percent height loss. Unchanged T12 and L2 compression deformities. Electronically Signed   By: Maurine Simmering M.D.   On: 04/25/2022 12:01   MR Lumbar Spine W Wo Contrast  Result Date: 04/25/2022 CLINICAL DATA:  Provided history: Low back pain, cauda equina syndrome suspected. EXAM: MRI LUMBAR SPINE WITHOUT AND WITH CONTRAST TECHNIQUE: Multiplanar and multiecho pulse sequences of the lumbar spine were obtained without and with intravenous contrast. CONTRAST:  76m GADAVIST GADOBUTROL 1 MMOL/ML IV SOLN COMPARISON:  CT abdomen/pelvis 04/22/2022. CT abdomen/pelvis 03/31/2019. Lumbar spine MRI 12/20/2021. FINDINGS: Segmentation: 5 lumbar vertebrae. The caudal most well-formed intervertebral disc space is designated L5-S1. Alignment: 2 mm grade 1  retrolisthesis at L1-L2. Unchanged 4 mm bony retropulsion at the level of the L4 inferior endplate. 3 mm L4-L5 grade 1 anterolisthesis. 3 mm L5-S1 grade 1 retrolisthesis. Vertebrae: Redemonstrated chronic T12 vertebral compression fracture (40-50% height loss) post vertebral augmentation. Acute L1 superior endplate vertebral compression fracture (approximately 20% height loss). Redemonstrated subacute on chronic L2 inferior endplate compression fracture. Approximately 30% height loss at this level, unchanged from the prior CT abdomen/pelvis of 04/22/2022. 4 mm bony retropulsion at the level of the L2 inferior endplate, also unchanged. Marrow edema within the L1 vertebral body, greatest along the superior endplate. Marrow edema  within the L2 vertebral body, greatest along the inferior endplate. Mild superior endplate edema also present at L3. There is an unchanged small Schmorl node within the right aspect of the L3 superior endplate without convincing superior endplate compression deformity. Marrow edema within the posterior elements at L3-L4, likely degenerative and related to facet arthrosis. Redemonstrated large L4 vertebral body hemangioma. Conus medullaris and cauda equina: Conus extends to the L2 level. No signal abnormality within the visualized distal spinal cord. Paraspinal and other soft tissues: Bilateral renal cysts. No paraspinal mass or collection. Disc levels: Unless otherwise stated, the level by level findings below have not significantly changed from the prior MRI of 12/20/2021. Multilevel disc degeneration, greatest at L1-L2 (moderate), L2-L3 (moderate) and L5-S1 (advanced). T11-T12: This level is imaged in the sagittal plane only. Disc bulge. Mild facet arthrosis. The disc bulge mildly effaces the ventral thecal sac and contacts the ventral aspect of the spinal cord. No significant foraminal stenosis. T12-L1: Disc bulge. Facet arthrosis (mild right, moderate left). Ligamentum flavum hypertrophy  on the left. No significant spinal canal or foraminal stenosis. L1-L2: 2 mm grade 1 retrolisthesis. Disc bulge. Mild-to-moderate facet arthrosis. Mild to moderate bilateral subarticular narrowing (without appreciable nerve root impingement). Mild narrowing of the central canal. Mild neural foraminal narrowing on the right. L2-L3: 4 mm bony retropulsion at the level of the L4 inferior endplate, new from the prior MRI of 12/20/2021. Progressive disc bulge. Progressive advanced facet arthrosis with ligamentum flavum hypertrophy. Progressive severe central canal stenosis with bilateral subarticular narrowing. Bilateral neural foraminal narrowing (moderate/severe, mild/moderate left), progressed. L3-L4: Disc bulge. Moderate facet arthrosis with ligamentum flavum hypertrophy. Mild right subarticular narrowing (without appreciable nerve root impingement). Mild relative narrowing of the central canal. Mild right neural foraminal narrowing. L4-L5: 3 mm grade 1 anterolisthesis. Disc uncovering with disc bulge. Advanced facet arthrosis with ligamentum flavum hypertrophy. Bilateral subarticular narrowing (mild-to-moderate right, mild left) with medialization of the descending right L5 nerve root. Moderate central canal stenosis. Minimal relative left neural foraminal narrowing. L5-S1: 3 mm grade 1 retrolisthesis. Disc bulge with endplate osteophytes. Right center/subarticular posterior annular fissure. Facet arthrosis (mild right, moderate left) with ligamentum flavum hypertrophy. Right subarticular stenosis with impingement of the descending right S1 nerve root. Mild left subarticular narrowing (without appreciable nerve root impingement at this site). No significant central canal stenosis. Bilateral neural foraminal narrowing (mild right, moderate left). IMPRESSION: Acute L1 superior endplate vertebral compression fracture (approximately 20% height loss). No significant bony retropulsion at this level. Subacute on chronic L2  inferior endplate vertebral compression fracture. Approximately 30% height loss at this level, unchanged from the recent prior CT abdomen/pelvis of 04/22/2022. At L2-L3, 4 mm bony retropulsion at the level of the L2 inferior endplate and superimposed spondylosis contribute to progressive severe central canal stenosis with bilateral subarticular narrowing. Bilateral neural foraminal narrowing (moderate/severe right, mild-to-moderate left), also progressed. Mild marrow edema along the L3 superior endplate. Unchanged small Schmorl node within the right aspect of the L3 superior endplate, but no convincing L3 superior endplate compression fracture. Unchanged chronic T12 vertebral compression fracture status post vertebral augmentation. Additional lumbar spondylosis, as outlined and otherwise unchanged from the prior MRI of 12/20/2021. At these remaining levels, spondylosis is greatest at L1-L2, L4-L5 and L5-S1. Electronically Signed   By: Kellie Simmering D.O.   On: 04/25/2022 11:01    Impression: *Abnormal CT abdomen-cecal distention *Abdominal pain due to above *Constipation-chronic  Plan: Discussed patient's CT results in depth with her today.  Possible ileus versus developing  pseudoobstruction.    Would continue to monitor for now.  Will need serial x-rays every morning.  She is passing flatus and had large bowel movement overnight which is a good sign.  No colonic wall thickening.  Check lactic acid.  Continue supportive care.  Limit opioids as best as we can.  Pending clinical course may need colonoscopy for decompression or possibly could try rivastigmine if concern for developing Ogilvie's/pseudoobstruction though would continue to monitor for now.  Thank you for the consultation  Elon Alas. Abbey Chatters, D.O. Gastroenterology and Hepatology [facility]    LOS: 0 days     04/26/2022, 12:02 PM

## 2022-04-26 NOTE — Evaluation (Signed)
Physical Therapy Evaluation Patient Details Name: Heather [name] MRN: [id number] DOB: [birth year] Today's Date: 04/26/2022  History of Present Illness  Heather [name] is a 88 y.o. female with medical history significant of hypertension, hyperlipidemia, anxiety, and GERD who presents with complaints of abdominal pain.  Pain is in the lower abdomen and into the back.  Reports associated symptoms of chest discomfort, urinary frequency, constipation for which her last bowel movement was 3-4 days ago.  Patient previously had fallen and sustained a compression fracture of L2 back in February for which she was sent to rehab.  She does not report any recent falls onset symptoms. Due to her symptoms she was needing significant assistance to get up.  CT angiogram of the chest abdomen pelvis noted new L1 compression fracture with chronic L2 compression fracture, distention of the cecum without signs of infection.   Clinical Impression  Patient limited for functional mobility as stated below secondary to BLE weakness, fatigue and poor standing balance. Patient requires assist to transition to seated EOB due to weakness and pain. She demonstrates good sitting tolerance and fair sitting balance EOB. She requires min/mod assist to transfer to standing with RW and requires assist for balance. She is able to ambulate several steps at bedside to chair and to [facility]. Patient ends session seated in chair with RN present. Patient will benefit from continued physical therapy in hospital and recommended venue below to increase strength, balance, endurance for safe ADLs and gait.        Recommendations for follow up therapy are one component of a multi-disciplinary discharge planning process, led by the attending physician.  Recommendations may be updated based on patient status, additional functional criteria and insurance authorization.  Follow Up Recommendations Skilled nursing-short term rehab (<3 hours/day) Can  patient physically be transported by private vehicle: Yes    Assistance Recommended at Discharge Frequent or constant Supervision/Assistance  Patient can return home with the following  A lot of help with walking and/or transfers;A lot of help with bathing/dressing/bathroom;Assistance with cooking/housework    Equipment Recommendations None recommended by PT  Recommendations for Other Services       Functional Status Assessment Patient has had a recent decline in their functional status and demonstrates the ability to make significant improvements in function in a reasonable and predictable amount of time.     Precautions / Restrictions Precautions Precautions: Fall Restrictions Weight Bearing Restrictions: No      Mobility  Bed Mobility Overal bed mobility: Needs Assistance Bed Mobility: Supine to Sit     Supine to sit: Min assist, Mod assist     General bed mobility comments: slow, labored, requires assist to pull to seated EOB    Transfers Overall transfer level: Needs assistance Equipment used: Rolling walker (2 wheels) Transfers: Sit to/from Stand, Bed to chair/wheelchair/BSC Sit to Stand: Min assist, Mod assist   Step pivot transfers: Min assist, Mod assist       General transfer comment: labored, assist for LE weakness to power up to standing; cueing for proper RW use    Ambulation/Gait Ambulation/Gait assistance: Min assist, Mod assist Gait Distance (Feet): 6 Feet Assistive device: Rolling walker (2 wheels) Gait Pattern/deviations: Shuffle Gait velocity: decreased     General Gait Details: slow small steps at bedside with RW to chair and then from chair to [facility] and back  Stairs            Wheelchair Mobility    Modified Rankin (Stroke Patients Only)  Balance Overall balance assessment: Needs assistance Sitting-balance support: Feet supported Sitting balance-Leahy Scale: Fair Sitting balance - Comments: seated EOB   Standing  balance support: Bilateral upper extremity supported, Reliant on assistive device for balance Standing balance-Leahy Scale: Poor Standing balance comment: fair/poor with RW                             Pertinent Vitals/Pain Pain Assessment Pain Assessment: Faces Faces Pain Scale: Hurts little more Pain Location: abdomen Pain Descriptors / Indicators: Grimacing Pain Intervention(s): Limited activity within patient's tolerance, Monitored during session, Repositioned    Home Living Family/patient expects to be discharged to:: Assisted living                 Home Equipment: Conservation officer, nature (2 wheels);Rollator (4 wheels);BSC/3in1;Shower seat;Grab bars - toilet;Grab bars - tub/shower Additional Comments: Pt receives assistance for dressing, meals are provided at facility    Prior Function Prior Level of Function : Needs assist       Physical Assist : Mobility (physical);ADLs (physical) Mobility (physical): Gait ADLs (physical): Bathing;IADLs Mobility Comments: Uses RW primarily for mobility ADLs Comments: ALF staff assist as needed, assist with bathing daily     Hand Dominance   Dominant Hand: Right    Extremity/Trunk Assessment   Upper Extremity Assessment Upper Extremity Assessment: Defer to OT evaluation    Lower Extremity Assessment Lower Extremity Assessment: Generalized weakness    Cervical / Trunk Assessment Cervical / Trunk Assessment: Kyphotic  Communication   Communication: No difficulties  Cognition Arousal/Alertness: Awake/alert Behavior During Therapy: WFL for tasks assessed/performed Overall Cognitive Status: Within Functional Limits for tasks assessed                                          General Comments      Exercises     Assessment/Plan    PT Assessment Patient needs continued PT services  PT Problem List Decreased strength;Decreased mobility;Decreased activity tolerance;Decreased balance       PT  Treatment Interventions DME instruction;Therapeutic activities;Gait training;Therapeutic exercise;Patient/family education;Stair training;Balance training;Functional mobility training;Neuromuscular re-education    PT Goals (Current goals can be found in the Care Plan section)  Acute Rehab PT Goals Patient Stated Goal: return home PT Goal Formulation: With patient Time For Goal Achievement: 05/10/22 Potential to Achieve Goals: Good    Frequency Min 3X/week     Co-evaluation PT/OT/SLP Co-Evaluation/Treatment: Yes Reason for Co-Treatment: Complexity of the patient's impairments (multi-system involvement);To address functional/ADL transfers PT goals addressed during session: Mobility/safety with mobility;Balance;Proper use of DME;Strengthening/ROM OT goals addressed during session: ADL's and self-care       AM-PAC PT "6 Clicks" Mobility  Outcome Measure Help needed turning from your back to your side while in a flat bed without using bedrails?: A Little Help needed moving from lying on your back to sitting on the side of a flat bed without using bedrails?: A Lot Help needed moving to and from a bed to a chair (including a wheelchair)?: A Lot Help needed standing up from a chair using your arms (e.g., wheelchair or bedside chair)?: A Lot Help needed to walk in hospital room?: A Lot Help needed climbing 3-5 steps with a railing? : A Lot 6 Click Score: 13    End of Session Equipment Utilized During Treatment: Gait belt;Oxygen Activity Tolerance: Patient tolerated treatment well;Patient limited  by fatigue Patient left: in chair;with call bell/phone within reach Nurse Communication: Mobility status PT Visit Diagnosis: Unsteadiness on feet (R26.81);Other abnormalities of gait and mobility (R26.89);Muscle weakness (generalized) (M62.81)    Time: 0822-0852 PT Time Calculation (min) (ACUTE ONLY): 30 min   Charges:   PT Evaluation $PT Eval Low Complexity: 1 Low PT  Treatments $Therapeutic Activity: 23-37 mins        11:26 AM, 04/26/22 Mearl Latin PT, DPT Physical Therapist at [facility]

## 2022-04-26 NOTE — TOC Initial Note (Signed)
Transition of Care [facility]) - Initial/Assessment Note    Patient Details  Name: Heather [name] MRN: [id number] Date of Birth: 05/31/1931  [facility]) CM/SW Contact:    Heather Flood, LCSW Phone Number: 04/26/2022, 12:17 PM  Clinical Narrative:                  Pt admitted from [facility]. PT/OT recommending SNF rehab at dc. Met with pt, her son and DIL at bedside to review recommendations for dc. Pt and son agreeable to SNF. They states she was at [facility] in February. Son requests TOC call pt's daughter who is pt's POA. Called daughter, Heather [name], to review above. [name] also agreeable to SNF referrals. CMS provider options reviewed. Will refer as requested.   MD anticipating dc tomorrow. Insurance authorization started. Will follow up in AM.  Expected Discharge Plan: [place] Barriers to Discharge: Continued Medical Work up   Patient Goals and CMS Choice Patient states their goals for this hospitalization and ongoing recovery are:: get better CMS Medicare.gov Compare Post Acute Care list provided to:: Patient Represenative (must comment) Choice offered to / list presented to : Adult Children  Expected Discharge Plan and Services Expected Discharge Plan: [place] In-house Referral: Clinical Social Work   Post Acute Care Choice: [place] Living arrangements for the past 2 months: [place]                                      Prior Living Arrangements/Services Living arrangements for the past 2 months: [place] Lives with:: Facility Resident Patient language and need for interpreter reviewed:: Yes Do you feel safe going back to the place where you live?: Yes      Need for Family Participation in Patient Care: No (Comment) Care giver support system in place?: Yes (comment)   Criminal Activity/Legal Involvement Pertinent to Current Situation/Hospitalization: No - Comment as  needed  Activities of Daily Living Home Assistive Devices/Equipment: Wheelchair, Environmental consultant (specify type), Other (Comment) (Currently resident at the [place]) ADL Screening (condition at time of admission) Patient's cognitive ability adequate to safely complete daily activities?: Yes Is the patient deaf or have difficulty hearing?: No Does the patient have difficulty seeing, even when wearing glasses/contacts?: No Does the patient have difficulty concentrating, remembering, or making decisions?: Yes Patient able to express need for assistance with ADLs?: Yes Does the patient have difficulty dressing or bathing?: No Independently performs ADLs?: No Communication: Independent Dressing (OT): Independent Grooming: Independent Feeding: Independent Bathing: Independent Toileting: Independent In/Out Bed: Needs assistance Is this a change from baseline?: Pre-admission baseline Walks in Home: Independent with device (comment) Does the patient have difficulty walking or climbing stairs?: Yes Weakness of Legs: Both Weakness of Arms/Hands: Both  Permission Sought/Granted Permission sought to share information with : Chartered certified accountant granted to share information with : Yes, Verbal Permission Granted     Permission granted to share info w AGENCY: SNF        Emotional Assessment Appearance:: Appears younger than stated age Attitude/Demeanor/Rapport: Engaged Affect (typically observed): Pleasant Orientation: : Oriented to Self, Oriented to Place, Oriented to  Time, Oriented to Situation Alcohol / Substance Use: Not Applicable Psych Involvement: No (comment)  Admission diagnosis:  Closed compression fracture of body of L1 vertebra ([place]) [S32.010A] Patient Active Problem List   Diagnosis Date Noted   Abnormal CT of the  abdomen 04/26/2022   Chronic back pain 04/26/2022   Closed compression fracture of body of L1 vertebra ([place]) 04/25/2022   Pressure injury of skin  04/25/2022   Acute respiratory failure with hypoxia ([place]) 04/25/2022   Low back pain 02/09/2022   Aspiration pneumonia ([place]) 01/27/2022   Elevated troponin 01/27/2022   Sepsis ([place]) 01/27/2022   Multifocal pneumonia 01/27/2022   Liver cirrhosis secondary to NASH ([place]) 11/03/2021   IBS (irritable bowel syndrome) 08/20/2020   Nausea with vomiting 06/18/2020   Anorexia 11/11/2019   Abdominal pain 08/12/2019   Constipation 08/12/2019   Rash and nonspecific skin eruption 08/12/2019   Elevated LFTs 08/12/2019   Carpal tunnel syndrome, right upper limb 08/31/2018   Essential hypertension 06/09/2018   Anxiety 06/09/2018   GERD (gastroesophageal reflux disease) 06/09/2018   Palpitations 04/22/2012   Chest pain 04/22/2012   Coronary artery disease excluded 04/22/2012   Insomnia 04/22/2012   PCP:  Heather Chroman, MD Pharmacy:   [facility], [place] [address] [place] [address] Phone: [phone] Fax: [phone]     Social Determinants of Health (SDOH) Interventions    Readmission Risk Interventions     No data to display

## 2022-04-26 NOTE — Assessment & Plan Note (Signed)
Oxygen saturation 86% on room air with shortness of breath. 730 CTA chest-- negative for PE--shows bibasilar atelectasis.  There is no consolidation.  There is GGO in the left upper lobe. -Check PCT -Suspect possible component of atelectatic disease versus possible infectious process -Viral respiratory panel -Stable on 2 L presently

## 2022-04-26 NOTE — Progress Notes (Signed)
  Echocardiogram 2D Echocardiogram has been performed.  Heather [name] 04/26/2022, 2:21 PM

## 2022-04-26 NOTE — Assessment & Plan Note (Signed)
Continue pantoprazole. °

## 2022-04-26 NOTE — Plan of Care (Signed)
  Problem: Acute Rehab PT Goals(only PT should resolve) Goal: Pt Will Go Supine/Side To Sit Outcome: Progressing Flowsheets (Taken 04/26/2022 1128) Pt will go Supine/Side to Sit:  with min guard assist  with minimal assist Goal: Pt Will Go Sit To Supine/Side Outcome: Progressing Flowsheets (Taken 04/26/2022 1128) Pt will go Sit to Supine/Side:  with min guard assist  with minimal assist Goal: Patient Will Transfer Sit To/From Stand Outcome: Progressing Flowsheets (Taken 04/26/2022 1128) Patient will transfer sit to/from stand:  with min guard assist  with minimal assist Goal: Pt Will Transfer Bed To Chair/Chair To Bed Outcome: Progressing Flowsheets (Taken 04/26/2022 1128) Pt will Transfer Bed to Chair/Chair to Bed:  min guard assist  with min assist Goal: Pt Will Ambulate Outcome: Progressing Flowsheets (Taken 04/26/2022 1128) Pt will Ambulate:  25 feet  with least restrictive assistive device  with minimal assist Goal: Pt/caregiver will Perform Home Exercise Program Outcome: Progressing Flowsheets (Taken 04/26/2022 1128) Pt/caregiver will Perform Home Exercise Program:  For increased strengthening  For improved balance  Independently  11:28 AM, 04/26/22 Mearl Latin PT, DPT Physical Therapist at [facility]

## 2022-04-26 NOTE — Assessment & Plan Note (Addendum)
Atypical by clinical history -CTA chest negative for PE -Reproducible on exam by palpation -Echocardiogram -Troponin 3>>> 3 -Personally reviewed EKG--sinus rhythm, nonspecific T wave change

## 2022-04-27 DIAGNOSIS — J9601 Acute respiratory failure with hypoxia: Secondary | ICD-10-CM | POA: Diagnosis not present

## 2022-04-27 DIAGNOSIS — K5900 Constipation, unspecified: Secondary | ICD-10-CM | POA: Diagnosis not present

## 2022-04-27 DIAGNOSIS — S32010A Wedge compression fracture of first lumbar vertebra, initial encounter for closed fracture: Secondary | ICD-10-CM | POA: Diagnosis not present

## 2022-04-27 DIAGNOSIS — R072 Precordial pain: Secondary | ICD-10-CM | POA: Diagnosis not present

## 2022-04-27 DIAGNOSIS — R935 Abnormal findings on diagnostic imaging of other abdominal regions, including retroperitoneum: Secondary | ICD-10-CM

## 2022-04-27 DIAGNOSIS — F419 Anxiety disorder, unspecified: Secondary | ICD-10-CM | POA: Diagnosis not present

## 2022-04-27 LAB — CBC
HCT: 40.1 % (ref 36.0–46.0)
Hemoglobin: 12.9 g/dL (ref 12.0–15.0)
MCH: 29.3 pg (ref 26.0–34.0)
MCHC: 32.2 g/dL (ref 30.0–36.0)
MCV: 91.1 fL (ref 80.0–100.0)
Platelets: 145 10*3/uL — ABNORMAL LOW (ref 150–400)
RBC: 4.4 MIL/uL (ref 3.87–5.11)
RDW: 12.6 % (ref 11.5–15.5)
WBC: 8.3 10*3/uL (ref 4.0–10.5)
nRBC: 0 % (ref 0.0–0.2)

## 2022-04-27 LAB — BASIC METABOLIC PANEL
Anion gap: 7 (ref 5–15)
BUN: 12 mg/dL (ref 8–23)
CO2: 27 mmol/L (ref 22–32)
Calcium: 8.3 mg/dL — ABNORMAL LOW (ref 8.9–10.3)
Chloride: 105 mmol/L (ref 98–111)
Creatinine, Ser: 0.74 mg/dL (ref 0.44–1.00)
GFR, Estimated: >60 mL/min (ref >60)
Glucose, Bld: 80 mg/dL (ref 70–99)
Potassium: 3.6 mmol/L (ref 3.5–5.1)
Sodium: 139 mmol/L (ref 135–145)

## 2022-04-27 LAB — LACTIC ACID, PLASMA: Lactic Acid, Venous: 1.1 mmol/L (ref 0.5–1.9)

## 2022-04-27 MED ORDER — ALPRAZOLAM 0.5 MG PO TABS
0.5000 mg | ORAL_TABLET | Freq: Two times a day (BID) | ORAL | Status: DC | PRN
  Administered 2022-04-28: 0.5 mg via ORAL
  Filled 2022-04-27: qty 1

## 2022-04-27 NOTE — Progress Notes (Addendum)
Subjective: Patient sitting up in chair, only complaint is back pain at this time, denies abdominal discomfort, nausea or vomiting. Patient reports last BM was maybe 4 days ago, however RN overseeing her care states last BM was 1900 yesterday. States She is not passing any flatus this morning.   Objective: Vital signs in last 24 hours: Temp:  [98 F (36.7 C)-98.9 F (37.2 C)] 98 F (36.7 C) (07/05 3244) Pulse Rate:  [68-95] 69 (07/05 0632) Resp:  [20] 20 (07/05 0632) BP: (141-154)/(49-64) 141/49 (07/05 0632) SpO2:  [98 %-100 %] 98 % (07/05 0102) Last BM Date : 04/26/22 General:   Alert and oriented, pleasant Head:  Normocephalic and atraumatic. Eyes:  No icterus, sclera clear. Conjuctiva pink.  Mouth:  Without lesions, mucosa pink and moist.  Heart:  S1, S2 present, no murmurs noted.  Lungs: Clear to auscultation bilaterally, without wheezing, rales, or rhonchi.  Abdomen:  good bowel sounds, abdomen TTP diffusely, though worse in LLQ. Mild distention noted.  Msk:  Symmetrical without gross deformities. Normal posture. Pulses:  Normal pulses noted. Extremities:  Without clubbing or edema. Neurologic:  Alert and  oriented x4;  grossly normal neurologically. Skin:  Warm and dry, intact without significant lesions.  Psych:  Alert and cooperative. Normal mood and affect.  Intake/Output from previous day: 07/04 0701 - 07/05 0700 In: 1048.1 [I.V.:1048.1] Out: 1250 [Urine:1250] Intake/Output this shift: Total I/O In: -  Out: 350 [Urine:350]  Lab Results: Recent Labs    04/25/22 0902 04/26/22 0445 04/27/22 0440  WBC 10.5 12.0* 8.3  HGB 16.4* 15.1* 12.9  HCT 48.8* 45.6 40.1  PLT 208 194 145*   BMET Recent Labs    04/25/22 0902 04/26/22 0445 04/27/22 0440  NA 137 136 139  K 3.7 3.9 3.6  CL 100 99 105  CO2 '28 27 27  '$ GLUCOSE 100* 129* 80  BUN '19 19 12  '$ CREATININE 0.82 0.97 0.74  CALCIUM 9.5 8.9 8.3*   LFT Recent Labs    04/25/22 0902  PROT 8.0  ALBUMIN 4.0   AST 22  ALT 21  ALKPHOS 94  BILITOT 0.5   Studies/Results: ECHOCARDIOGRAM COMPLETE  Result Date: 04/26/2022    ECHOCARDIOGRAM REPORT   Patient Name:   Heather [name] Date of Exam: 04/26/2022 Medical Rec #:  [id number]          Height:       67.0 in Accession #:    [id number]         Weight:       171.7 lb Date of Birth:  August 27, 1931          BSA:          1.895 m Patient Age:    69 years           BP:           150/69 mmHg Patient Gender: F                  HR:           80 bpm. Exam Location:  [facility] Procedure: 2D Echo Indications:    chest pain.  History:        Patient has prior history of Echocardiogram examinations, most                 recent 12/27/2019. Risk Factors:Hypertension.  Sonographer:    Johny Chess RDCS Referring Phys: [phone] DAVID TAT IMPRESSIONS  1. Left ventricular ejection fraction, by estimation, is 65  to 70%. The left ventricle has normal function. The left ventricle has no regional wall motion abnormalities. There is mild left ventricular hypertrophy. Left ventricular diastolic parameters are consistent with Grade I diastolic dysfunction (impaired relaxation).  2. Right ventricular systolic function is normal. The right ventricular size is normal.  3. The mitral valve is normal in structure. No evidence of mitral valve regurgitation. No evidence of mitral stenosis.  4. The aortic valve is normal in structure. Aortic valve regurgitation is not visualized. No aortic stenosis is present.  5. The inferior vena cava is normal in size with greater than 50% respiratory variability, suggesting right atrial pressure of 3 mmHg. Comparison(s): No significant change from prior study. Prior images reviewed side by side. FINDINGS  Left Ventricle: Left ventricular ejection fraction, by estimation, is 65 to 70%. The left ventricle has normal function. The left ventricle has no regional wall motion abnormalities. The left ventricular internal cavity size was normal in size. There is  mild left  ventricular hypertrophy. Left ventricular diastolic parameters are consistent with Grade I diastolic dysfunction (impaired relaxation). Right Ventricle: The right ventricular size is normal. No increase in right ventricular wall thickness. Right ventricular systolic function is normal. Left Atrium: Left atrial size was normal in size. Right Atrium: Right atrial size was normal in size. Pericardium: There is no evidence of pericardial effusion. Mitral Valve: The mitral valve is normal in structure. No evidence of mitral valve regurgitation. No evidence of mitral valve stenosis. Tricuspid Valve: The tricuspid valve is normal in structure. Tricuspid valve regurgitation is not demonstrated. No evidence of tricuspid stenosis. Aortic Valve: The aortic valve is normal in structure. Aortic valve regurgitation is not visualized. No aortic stenosis is present. Pulmonic Valve: The pulmonic valve was normal in structure. Pulmonic valve regurgitation is trivial. No evidence of pulmonic stenosis. Aorta: The aortic root is normal in size and structure. Venous: The inferior vena cava is normal in size with greater than 50% respiratory variability, suggesting right atrial pressure of 3 mmHg. IAS/Shunts: No atrial level shunt detected by color flow Doppler.  LEFT VENTRICLE PLAX 2D LVIDd:         4.40 cm   Diastology LVIDs:         3.30 cm   LV e' medial:    6.09 cm/s LV PW:         1.30 cm   LV E/e' medial:  8.5 LV IVS:        0.90 cm   LV e' lateral:   9.90 cm/s LVOT diam:     2.00 cm   LV E/e' lateral: 5.3 LV SV:         53 LV SV Index:   28 LVOT Area:     3.14 cm  RIGHT VENTRICLE             IVC RV S prime:     23.90 cm/s  IVC diam: 1.90 cm TAPSE (M-mode): 1.6 cm LEFT ATRIUM             Index        RIGHT ATRIUM           Index LA diam:        4.00 cm 2.11 cm/m   RA Area:     14.60 cm LA Vol (A2C):   31.6 ml 16.67 ml/m  RA Volume:   37.10 ml  19.57 ml/m LA Vol (A4C):   33.5 ml 17.67 ml/m LA Biplane Vol: 33.4 ml 17.62 ml/m  AORTIC VALVE             PULMONIC VALVE LVOT Vmax:   94.10 cm/s  PR End Diast Vel: 7.18 msec LVOT Vmean:  65.100 cm/s LVOT VTI:    0.169 m  AORTA Ao Root diam: 3.10 cm Ao Asc diam:  3.20 cm MV E velocity: 52.00 cm/s MV A velocity: 123.00 cm/s  SHUNTS MV E/A ratio:  0.42         Systemic VTI:  0.17 m                             Systemic Diam: 2.00 cm Candee Furbish MD Electronically signed by Candee Furbish MD Signature Date/Time: 04/26/2022/2:53:41 PM    Final    DG Abd 2 Views  Result Date: 04/26/2022 CLINICAL DATA:  Abdominal distension and pain. History of diabetes and hypertension. EXAM: ABDOMEN - 2 VIEW COMPARISON:  Plain film of the abdomen dated 11/16/2020 FINDINGS: No dilated large or small bowel loops are identified. No radiographic evidence of constipation. No evidence of soft tissue mass or abnormal fluid collection. No evidence of free intraperitoneal air. No evidence of renal or ureteral calculi. IMPRESSION: No acute findings. Nonobstructive bowel gas pattern. Electronically Signed   By: Franki Cabot M.D.   On: 04/26/2022 13:44   CT Angio Chest PE W and/or Wo Contrast  Result Date: 04/25/2022 CLINICAL DATA:  Pulmonary embolism (PE) suspected, positive D-dimer; Abdominal pain, acute, nonlocalized EXAM: CT ANGIOGRAPHY CHEST CT ABDOMEN AND PELVIS WITH CONTRAST TECHNIQUE: Multidetector CT imaging of the chest was performed using the standard protocol during bolus administration of intravenous contrast. Multiplanar CT image reconstructions and MIPs were obtained to evaluate the vascular anatomy. Multidetector CT imaging of the abdomen and pelvis was performed using the standard protocol during bolus administration of intravenous contrast. RADIATION DOSE REDUCTION: This exam was performed according to the departmental dose-optimization program which includes automated exposure control, adjustment of the mA and/or kV according to patient size and/or use of iterative reconstruction technique. CONTRAST:  157m  OMNIPAQUE IOHEXOL 350 MG/ML SOLN COMPARISON:  CT abdomen pelvis 04/22/2022. FINDINGS: CTA CHEST FINDINGS Cardiovascular: Satisfactory opacification of the pulmonary arteries to the segmental level. No evidence of pulmonary embolism. Mild cardiomegaly.No pericardial disease. Coronary artery atherosclerosis. Moderate atherosclerosis of thoracic aorta. Mediastinum/Nodes: No lymphadenopathy. There is a 1.6 cm heterogeneous left thyroid nodule. Small hiatal hernia. Lungs/Pleura: Bibasilar hypoventilatory changes and atelectasis. No focal airspace consolidation. There is a 4 mm ground-glass nodule in the left upper lobe (series 5, image 58). No follow-up imaging is recommended ([employer] 2017; Radiology 2017; [phone]. Musculoskeletal: There is a chronic T12 compression deformity with prior vertebroplasty. Review of the MIP images confirms the above findings. CT ABDOMEN and PELVIS FINDINGS Hepatobiliary: No focal liver abnormality is seen. The gallbladder is unremarkable. Pancreas: Unremarkable. No pancreatic ductal dilatation or surrounding inflammatory changes. Spleen: Normal in size without focal abnormality. Adrenals/Urinary Tract: Adrenal glands are unremarkable. No hydronephrosis or nephrolithiasis. There are tiny bilateral renal cysts. The bladder is moderately distended. Stomach/Bowel: Small hiatal hernia. The stomach is otherwise within normal limits. There is no evidence of bowel obstruction.The appendix is normal. There is marked distension of the cecum and to a lesser degree the a hepatic flexure and transverse colon with air-fluid and fat fluid levels. There is no adjacent stranding.Colonic diverticulosis. No diverticulitis. Vascular/Lymphatic: Aortoiliac atherosclerosis. No AAA. No lymphadenopathy. Reproductive: Unremarkable. Other: Diastasis recti. Bilateral fat containing inguinal hernias. No bowel containing  hernia. No ascites. No free air. Musculoskeletal: No acute osseous abnormality. No  suspicious osseous lesion. Unchanged compression fracture of L2. No progressive height loss. Unchanged L4 vertebral body hemangioma. There is a new superior endplate compression fracture of L1 with approximately 15% height loss. Unchanged multilevel degenerative disc disease most severe at L5-S1 with trace retrolisthesis. Unchanged grade 1 anterolisthesis at L4-L5. Mild bilateral hip osteoarthritis. Review of the MIP images confirms the above findings. IMPRESSION: CT CHEST: No evidence of pulmonary embolism.  No focal airspace disease. 1.6 cm left thyroid nodule. If not previously performed, recommend non-emergent thyroid ultrasound as an outpatient. Small hiatal hernia. CT ABDOMEN AND PELVIS: Distension of the cecum and to a lesser degree of the hepatic flexure and transverse colon with air-fluid and fat-fluid levels. No wall thickening or adjacent stranding to suggest colitis, however this could potentially be related the patient's pain/discomfort. Diverticulosis without diverticulitis. Normal appendix. No bowel obstruction. New superior endplate compression fracture of L1 with approximately 15 percent height loss. Unchanged T12 and L2 compression deformities. Electronically Signed   By: Maurine Simmering M.D.   On: 04/25/2022 12:01   CT ABDOMEN PELVIS W CONTRAST  Result Date: 04/25/2022 CLINICAL DATA:  Pulmonary embolism (PE) suspected, positive D-dimer; Abdominal pain, acute, nonlocalized EXAM: CT ANGIOGRAPHY CHEST CT ABDOMEN AND PELVIS WITH CONTRAST TECHNIQUE: Multidetector CT imaging of the chest was performed using the standard protocol during bolus administration of intravenous contrast. Multiplanar CT image reconstructions and MIPs were obtained to evaluate the vascular anatomy. Multidetector CT imaging of the abdomen and pelvis was performed using the standard protocol during bolus administration of intravenous contrast. RADIATION DOSE REDUCTION: This exam was performed according to the departmental  dose-optimization program which includes automated exposure control, adjustment of the mA and/or kV according to patient size and/or use of iterative reconstruction technique. CONTRAST:  154m OMNIPAQUE IOHEXOL 350 MG/ML SOLN COMPARISON:  CT abdomen pelvis 04/22/2022. FINDINGS: CTA CHEST FINDINGS Cardiovascular: Satisfactory opacification of the pulmonary arteries to the segmental level. No evidence of pulmonary embolism. Mild cardiomegaly.No pericardial disease. Coronary artery atherosclerosis. Moderate atherosclerosis of thoracic aorta. Mediastinum/Nodes: No lymphadenopathy. There is a 1.6 cm heterogeneous left thyroid nodule. Small hiatal hernia. Lungs/Pleura: Bibasilar hypoventilatory changes and atelectasis. No focal airspace consolidation. There is a 4 mm ground-glass nodule in the left upper lobe (series 5, image 58). No follow-up imaging is recommended ([employer] 2017; Radiology 2017; [phone]. Musculoskeletal: There is a chronic T12 compression deformity with prior vertebroplasty. Review of the MIP images confirms the above findings. CT ABDOMEN and PELVIS FINDINGS Hepatobiliary: No focal liver abnormality is seen. The gallbladder is unremarkable. Pancreas: Unremarkable. No pancreatic ductal dilatation or surrounding inflammatory changes. Spleen: Normal in size without focal abnormality. Adrenals/Urinary Tract: Adrenal glands are unremarkable. No hydronephrosis or nephrolithiasis. There are tiny bilateral renal cysts. The bladder is moderately distended. Stomach/Bowel: Small hiatal hernia. The stomach is otherwise within normal limits. There is no evidence of bowel obstruction.The appendix is normal. There is marked distension of the cecum and to a lesser degree the a hepatic flexure and transverse colon with air-fluid and fat fluid levels. There is no adjacent stranding.Colonic diverticulosis. No diverticulitis. Vascular/Lymphatic: Aortoiliac atherosclerosis. No AAA. No lymphadenopathy.  Reproductive: Unremarkable. Other: Diastasis recti. Bilateral fat containing inguinal hernias. No bowel containing hernia. No ascites. No free air. Musculoskeletal: No acute osseous abnormality. No suspicious osseous lesion. Unchanged compression fracture of L2. No progressive height loss. Unchanged L4 vertebral body hemangioma. There is a new superior endplate compression fracture of L1 with  approximately 15% height loss. Unchanged multilevel degenerative disc disease most severe at L5-S1 with trace retrolisthesis. Unchanged grade 1 anterolisthesis at L4-L5. Mild bilateral hip osteoarthritis. Review of the MIP images confirms the above findings. IMPRESSION: CT CHEST: No evidence of pulmonary embolism.  No focal airspace disease. 1.6 cm left thyroid nodule. If not previously performed, recommend non-emergent thyroid ultrasound as an outpatient. Small hiatal hernia. CT ABDOMEN AND PELVIS: Distension of the cecum and to a lesser degree of the hepatic flexure and transverse colon with air-fluid and fat-fluid levels. No wall thickening or adjacent stranding to suggest colitis, however this could potentially be related the patient's pain/discomfort. Diverticulosis without diverticulitis. Normal appendix. No bowel obstruction. New superior endplate compression fracture of L1 with approximately 15 percent height loss. Unchanged T12 and L2 compression deformities. Electronically Signed   By: Maurine Simmering M.D.   On: 04/25/2022 12:01   MR Lumbar Spine W Wo Contrast  Result Date: 04/25/2022 CLINICAL DATA:  Provided history: Low back pain, cauda equina syndrome suspected. EXAM: MRI LUMBAR SPINE WITHOUT AND WITH CONTRAST TECHNIQUE: Multiplanar and multiecho pulse sequences of the lumbar spine were obtained without and with intravenous contrast. CONTRAST:  47m GADAVIST GADOBUTROL 1 MMOL/ML IV SOLN COMPARISON:  CT abdomen/pelvis 04/22/2022. CT abdomen/pelvis 03/31/2019. Lumbar spine MRI 12/20/2021. FINDINGS: Segmentation: 5  lumbar vertebrae. The caudal most well-formed intervertebral disc space is designated L5-S1. Alignment: 2 mm grade 1 retrolisthesis at L1-L2. Unchanged 4 mm bony retropulsion at the level of the L4 inferior endplate. 3 mm L4-L5 grade 1 anterolisthesis. 3 mm L5-S1 grade 1 retrolisthesis. Vertebrae: Redemonstrated chronic T12 vertebral compression fracture (40-50% height loss) post vertebral augmentation. Acute L1 superior endplate vertebral compression fracture (approximately 20% height loss). Redemonstrated subacute on chronic L2 inferior endplate compression fracture. Approximately 30% height loss at this level, unchanged from the prior CT abdomen/pelvis of 04/22/2022. 4 mm bony retropulsion at the level of the L2 inferior endplate, also unchanged. Marrow edema within the L1 vertebral body, greatest along the superior endplate. Marrow edema within the L2 vertebral body, greatest along the inferior endplate. Mild superior endplate edema also present at L3. There is an unchanged small Schmorl node within the right aspect of the L3 superior endplate without convincing superior endplate compression deformity. Marrow edema within the posterior elements at L3-L4, likely degenerative and related to facet arthrosis. Redemonstrated large L4 vertebral body hemangioma. Conus medullaris and cauda equina: Conus extends to the L2 level. No signal abnormality within the visualized distal spinal cord. Paraspinal and other soft tissues: Bilateral renal cysts. No paraspinal mass or collection. Disc levels: Unless otherwise stated, the level by level findings below have not significantly changed from the prior MRI of 12/20/2021. Multilevel disc degeneration, greatest at L1-L2 (moderate), L2-L3 (moderate) and L5-S1 (advanced). T11-T12: This level is imaged in the sagittal plane only. Disc bulge. Mild facet arthrosis. The disc bulge mildly effaces the ventral thecal sac and contacts the ventral aspect of the spinal cord. No significant  foraminal stenosis. T12-L1: Disc bulge. Facet arthrosis (mild right, moderate left). Ligamentum flavum hypertrophy on the left. No significant spinal canal or foraminal stenosis. L1-L2: 2 mm grade 1 retrolisthesis. Disc bulge. Mild-to-moderate facet arthrosis. Mild to moderate bilateral subarticular narrowing (without appreciable nerve root impingement). Mild narrowing of the central canal. Mild neural foraminal narrowing on the right. L2-L3: 4 mm bony retropulsion at the level of the L4 inferior endplate, new from the prior MRI of 12/20/2021. Progressive disc bulge. Progressive advanced facet arthrosis with ligamentum flavum hypertrophy. Progressive  severe central canal stenosis with bilateral subarticular narrowing. Bilateral neural foraminal narrowing (moderate/severe, mild/moderate left), progressed. L3-L4: Disc bulge. Moderate facet arthrosis with ligamentum flavum hypertrophy. Mild right subarticular narrowing (without appreciable nerve root impingement). Mild relative narrowing of the central canal. Mild right neural foraminal narrowing. L4-L5: 3 mm grade 1 anterolisthesis. Disc uncovering with disc bulge. Advanced facet arthrosis with ligamentum flavum hypertrophy. Bilateral subarticular narrowing (mild-to-moderate right, mild left) with medialization of the descending right L5 nerve root. Moderate central canal stenosis. Minimal relative left neural foraminal narrowing. L5-S1: 3 mm grade 1 retrolisthesis. Disc bulge with endplate osteophytes. Right center/subarticular posterior annular fissure. Facet arthrosis (mild right, moderate left) with ligamentum flavum hypertrophy. Right subarticular stenosis with impingement of the descending right S1 nerve root. Mild left subarticular narrowing (without appreciable nerve root impingement at this site). No significant central canal stenosis. Bilateral neural foraminal narrowing (mild right, moderate left). IMPRESSION: Acute L1 superior endplate vertebral  compression fracture (approximately 20% height loss). No significant bony retropulsion at this level. Subacute on chronic L2 inferior endplate vertebral compression fracture. Approximately 30% height loss at this level, unchanged from the recent prior CT abdomen/pelvis of 04/22/2022. At L2-L3, 4 mm bony retropulsion at the level of the L2 inferior endplate and superimposed spondylosis contribute to progressive severe central canal stenosis with bilateral subarticular narrowing. Bilateral neural foraminal narrowing (moderate/severe right, mild-to-moderate left), also progressed. Mild marrow edema along the L3 superior endplate. Unchanged small Schmorl node within the right aspect of the L3 superior endplate, but no convincing L3 superior endplate compression fracture. Unchanged chronic T12 vertebral compression fracture status post vertebral augmentation. Additional lumbar spondylosis, as outlined and otherwise unchanged from the prior MRI of 12/20/2021. At these remaining levels, spondylosis is greatest at L1-L2, L4-L5 and L5-S1. Electronically Signed   By: Kellie Simmering D.O.   On: 04/25/2022 11:01    Assessment: Heather [name] is a 88 year old female with a past medical history of IBS-C, hypertension, [name], who presented to [facility] ED on Monday evening with chief complaints of worsening abdominal pain distention.  reported Pain in the periumbilical area radiating to her back.  Also notes chest discomfort, and constipation x3 -4 days. GI consulted for further evaluation  CT A/P w contrast on 7/3 with Distension of the cecum and to a lesser degree of the hepatic flexure and transverse colon with air-fluid and fat-fluid levels. No wall thickening or adjacent stranding to suggest colitis. No diverticulitis, BO or evidence of acute appendicitis. Imaging concerning for ileus vs possibly developing pseudoobstruction.  KUB yesterday morning with no acute findings, non obstructive bowel gas pattern.   Patient passing  flatus yesterday with apparent large BM Monday night, though today, patient tells me last BM was about 4 days ago, though RN overseeing patient's care reported last BM was 1900 on 7/4. Patient states she is not passing flatus this morning. She is tolerating regular diet without issue and has no nausea, vomiting or abdominal pain.   Recommend continuing supportive measures with frequent ambulation (as able due to known lumbar compression fracture) and limiting opioids. Further consideration for colonoscopy for decompresssion or possible trial of Rivastigmine if concern for developing of Ogilvie's/pseudo obstruction, though these interventions not currently warranted.   Plan: Continue supportive measures, frequent ambulation Avoid opiates Further consideration for colonoscopy/trial of Rivastigmine if concern for pseudo obstruction development   LOS: 1 day    04/27/2022, 9:28 AM  Dalan Cowger L. Alver Sorrow, MSN, APRN, AGNP-C Adult-Gerontology Nurse Practitioner [facility]

## 2022-04-27 NOTE — Progress Notes (Signed)
Physical Therapy Treatment Patient Details Name: Heather [name] MRN: [id number] DOB: March 24, 1931 Today's Date: 04/27/2022   History of Present Illness Heather [name] is a 88 y.o. female with medical history significant of hypertension, hyperlipidemia, anxiety, and GERD who presents with complaints of abdominal pain.  Pain is in the lower abdomen and into the back.  Reports associated symptoms of chest discomfort, urinary frequency, constipation for which her last bowel movement was 3-4 days ago.  Patient previously had fallen and sustained a compression fracture of L2 back in February for which she was sent to rehab.  She does not report any recent falls onset symptoms. Due to her symptoms she was needing significant assistance to get up.  CT angiogram of the chest abdomen pelvis noted new L1 compression fracture with chronic L2 compression fracture, distention of the cecum without signs of infection.    PT Comments    Pt demonstrates improved mobility today but will continue to benefit from SNF to improve strength and endurance as pt lives alone.     Recommendations for follow up therapy are one component of a multi-disciplinary discharge planning process, led by the attending physician.  Recommendations may be updated based on patient status, additional functional criteria and insurance authorization.  Follow Up Recommendations  Skilled nursing-short term rehab (<3 hours/day) Can patient physically be transported by private vehicle: Yes   Assistance Recommended at Discharge Frequent or constant Supervision/Assistance  Patient can return home with the following A lot of help with walking and/or transfers;A lot of help with bathing/dressing/bathroom;Assistance with cooking/housework   Equipment Recommendations  None recommended by PT    Recommendations for Other Services  OT     Precautions / Restrictions Precautions Precautions: Fall Restrictions Weight Bearing Restrictions: No      Mobility  Bed Mobility Overal bed mobility: Needs Assistance Bed Mobility: Supine to Sit     Supine to sit: Min assist          Transfers Overall transfer level: Needs assistance Equipment used: Rolling walker (2 wheels) Transfers: Sit to/from Stand Sit to Stand: Min assist                Ambulation/Gait Ambulation/Gait assistance: Supervision Gait Distance (Feet): 15 Feet Assistive device: Rolling walker (2 wheels) Gait Pattern/deviations: Step-through pattern       General Gait Details: improved gt no shuffling noted          Cognition Arousal/Alertness: Awake/alert Behavior During Therapy: WFL for tasks assessed/performed Overall Cognitive Status: Within Functional Limits for tasks assessed                                          Exercises General Exercises - Lower Extremity Ankle Circles/Pumps: AROM, 10 reps Quad Sets: AROM, 10 reps Gluteal Sets: AROM, 10 reps Heel Slides: AROM, 5 reps Mini-Sqauts: Strengthening, Both, Seated, Standing, 10 reps        Pertinent Vitals/Pain Pain Assessment Pain Assessment: No/denies pain (initially in bed pt states no pain; while completing bed mobility pain increased to 4-5/10)    Home Living Family/patient expects to be discharged to:: Assisted living                 Home Equipment: Conservation officer, nature (2 wheels);Rollator (4 wheels);BSC/3in1;Shower seat;Grab bars - toilet;Grab bars - tub/shower Additional Comments: Pt receives assistance for dressing, meals are provided at facility    Prior Function  PT Goals (current goals can now be found in the care plan section) Acute Rehab PT Goals PT Goal Formulation: With patient    Frequency    Min 3X/week      PT Plan      Co-evaluation PT/OT/SLP Co-Evaluation/Treatment: Yes            AM-PAC PT "6 Clicks" Mobility   Outcome Measure  Help needed turning from your back to your side while in a flat bed  without using bedrails?: A Little Help needed moving from lying on your back to sitting on the side of a flat bed without using bedrails?: A Little Help needed moving to and from a bed to a chair (including a wheelchair)?: A Little Help needed standing up from a chair using your arms (e.g., wheelchair or bedside chair)?: A Little Help needed to walk in hospital room?: A Little Help needed climbing 3-5 steps with a railing? : A Lot 6 Click Score: 17    End of Session Equipment Utilized During Treatment: Gait belt;Oxygen Activity Tolerance: Patient tolerated treatment well;Patient limited by fatigue Patient left: in chair;with call bell/phone within reach Nurse Communication: Mobility status PT Visit Diagnosis: Unsteadiness on feet (R26.81);Other abnormalities of gait and mobility (R26.89);Muscle weakness (generalized) (M62.81)     Time: 3094-0768 PT Time Calculation (min) (ACUTE ONLY): 26 min  Charges:  $Gait Training: 8-22 mins $Therapeutic Exercise: 8-22 mins                    Rayetta Humphrey, PT CLT [id number]  04/27/2022, 9:39 AM

## 2022-04-27 NOTE — Progress Notes (Signed)
Patient had one episode of rectal bleeding this shift, MD informed with orders given

## 2022-04-27 NOTE — Progress Notes (Signed)
PROGRESS NOTE  Heather [name] KDX:[id number] DOB: [birth year] DOA: 04/25/2022 PCP: Glenda Chroman, MD  Brief History:  88 year old female with history of hypertension, hyperlipidemia, anxiety, GERD, IBS presenting with abdominal pain in the lower abdomen.  Notably, the patient was seen at [facility] ED on 04/22/2022 with complaints of urinary frequency and lower abdominal pain and low back pain.  At that time, the patient stated that she was unable to stop urinating.   CT of the abdomen and pelvis was obtained during that ED visit and showed no acute process.  Specifically the CT also showed L2 compression fracture I was present from CT earlier in the month.  There was diverticulosis without any diverticulitis and bilateral inguinal hernias without incarceration.  The patient was given IV morphine, Zofran, and 1 L of IV fluids.  With improvement of her pain.  During that ED's visit, WBC was 12.0 with hemoglobin 16.6 and platelets 246,000.  Her BMP was unremarkable with sodium 140, potassium 3.5, bicarbonate 29, and serum creatinine 0.86.  UA was negative for any pyuria or hematuria at that time.  She was discharged home in stable condition.She complained of sharp pains in her lower abdomen and radiating to her back without any fevers or chills.  Further history reveals that the patient has been struggling with constipation.  She states that she had not had a bowel movement for about 2 weeks.  As result, she had noted increasing abdominal pain over that period of time.  She denies any hematochezia, melena, nausea, vomiting.  There is no dysuria, but she has complained of urinary frequency.  There is no hematuria.  She denies any fevers or chills.  She stated that the pain was primarily in the lower abdomen right greater than left. In addition, the patient states that she has had 3 falls in the past month, most recently about 2 weeks prior to this admission.  There is no syncope.  Apparently she was in SNF  rehab about 2 months ago. ED Upon admission to the emergency department patient was seen to be afebrile with O2 saturations as low as 88% with improvement on 2 L nasal cannula oxygen.  Labs noted WBC 10.5, hemoglobin 16.4, high-sensitivity troponins negative x2, and D-dimer 1.87.  Urinalysis did not show any significant signs of infection.  CT angiogram of the chest abdomen pelvis noted new L1 compression fracture with chronic L2 compression fracture, distention of the cecum without signs of infection.     Assessment and Plan: * Closed compression fracture of body of L1 vertebra (HCC) Noted on CTA abdomen -Secondary to mechanical falls -Judicious opioids -PT evaluation with recommendations for SNF -CT also noted previous compression fractures T12 and L2.  Acute respiratory failure with hypoxia (HCC) Oxygen saturation 86% on room air with shortness of breath. 730 CTA chest-- negative for PE--shows bibasilar atelectasis.  There is no consolidation.  There is GGO in the left upper lobe. -PCT negative -Suspect possible component of atelectatic disease  -Viral respiratory panel negative -Stable on 2 L presently  Constipation Abdominal pain seems to be improving after bowel movements here in the hospital -Continue MiraLAX, lactulose and Senokot  Essential hypertension Holding amlodipine secondary to soft blood pressure Continue metoprolol for heart rate at lower dose  Anxiety PDMP reviewed -- Patient receives Xanax 1 mg, #60, last refilled 04/06/2022  GERD (gastroesophageal reflux disease) Continue pantoprazole  Chronic back pain PDMP reviewed -- Patient receives tramadol # 44,  last refilled 03/30/2022 -Continue gabapentin -Also previously had a prescription for hydrocodone 5/325, #20 on 02/09/2022  Abnormal CT of the abdomen 05/22/2022 CT abdomen--marked distention of the cecum extending to the hepatic flexure and transverse colon with air-fluid levels -Suspect this may be due to  ileus vs. Psbo from the patient's constipation -GI following, determining whether patient would need colonoscopy -she is now having some bowel movements with hematochezia -follow cbc -UA negative for pyuria  Chest pain Atypical by clinical history -CTA chest negative for PE -Reproducible on exam by palpation -Echocardiogram shows preserved EF, no WMA -Troponin 3>>> 3 -Personally reviewed EKG--sinus rhythm, nonspecific T wave change      Family Communication:  updated son at bedside and daughter over the phone  Consultants:  GI  Code Status:  FULL  DVT Prophylaxis:  SCDs   Procedures: As Listed in Progress Note Above  Antibiotics: None     Subjective: Patient had BM earlier today that was mostly blood. She denies any abdominal pain. Family notes that she has been more paranoid overnight and into this morning.  Objective: Vitals:   04/26/22 1430 04/26/22 2145 04/27/22 0632 04/27/22 1245  BP: (!) 154/64 (!) 142/63 (!) 141/49 136/64  Pulse: 95 68 69 75  Resp: '20 20 20 16  '$ Temp: 98.9 F (37.2 C) 98 F (36.7 C) 98 F (36.7 C) 97.9 F (36.6 C)  TempSrc: Oral Oral Oral   SpO2: 98% 100% 98% 97%  Weight:      Height:        Intake/Output Summary (Last 24 hours) at 04/27/2022 1827 Last data filed at 04/27/2022 1300 Gross per 24 hour  Intake 360 ml  Output 1600 ml  Net -1240 ml   Weight change:  Exam:  General:  Pt is alert, follows commands appropriately, not in acute distress HEENT: No icterus, No thrush, No neck mass, [place]/AT Cardiovascular: RRR, S1/S2, no rubs, no gallops Respiratory: Bibasilar crackles.  No wheezing.  Good air movement. Abdomen: Soft/+BS, RLQ tender, non distended, no guarding Extremities: No edema, No lymphangitis, No petechiae, No rashes, no synovitis   Data Reviewed: I have personally reviewed following labs and imaging studies Basic Metabolic Panel: Recent Labs  Lab 04/25/22 0902 04/26/22 0445 04/27/22 0440  NA 137 136 139  K  3.7 3.9 3.6  CL 100 99 105  CO2 '28 27 27  '$ GLUCOSE 100* 129* 80  BUN '19 19 12  '$ CREATININE 0.82 0.97 0.74  CALCIUM 9.5 8.9 8.3*  MG 2.4  --   --    Liver Function Tests: Recent Labs  Lab 04/25/22 0902  AST 22  ALT 21  ALKPHOS 94  BILITOT 0.5  PROT 8.0  ALBUMIN 4.0   Recent Labs  Lab 04/25/22 0902  LIPASE 26   Recent Labs  Lab 04/26/22 1733  AMMONIA 23   Coagulation Profile: No results for input(s): "INR", "PROTIME" in the last 168 hours. CBC: Recent Labs  Lab 04/25/22 0902 04/26/22 0445 04/27/22 0440  WBC 10.5 12.0* 8.3  NEUTROABS 6.6  --   --   HGB 16.4* 15.1* 12.9  HCT 48.8* 45.6 40.1  MCV 88.2 89.4 91.1  PLT 208 194 145*   Cardiac Enzymes: No results for input(s): "CKTOTAL", "CKMB", "CKMBINDEX", "TROPONINI" in the last 168 hours. BNP: Invalid input(s): "POCBNP" CBG: No results for input(s): "GLUCAP" in the last 168 hours. HbA1C: No results for input(s): "HGBA1C" in the last 72 hours. Urine analysis:    Component Value Date/Time   COLORURINE  YELLOW 04/25/2022 0852   APPEARANCEUR HAZY (A) 04/25/2022 0852   LABSPEC 1.017 04/25/2022 0852   PHURINE 7.0 04/25/2022 [place] 04/25/2022 0852   HGBUR NEGATIVE 04/25/2022 0852   BILIRUBINUR NEGATIVE 04/25/2022 [place] 04/25/2022 0852   PROTEINUR NEGATIVE 04/25/2022 0852   NITRITE NEGATIVE 04/25/2022 0852   LEUKOCYTESUR NEGATIVE 04/25/2022 0852   Sepsis Labs: '@LABRCNTIP'$ (procalcitonin:4,lacticidven:4) ) Recent Results (from the past 240 hour(s))  Respiratory (~20 pathogens) panel by PCR     Status: None   Collection Time: 04/26/22  5:57 PM   Specimen: Nasopharyngeal Swab; Respiratory  Result Value Ref Range Status   Adenovirus NOT DETECTED NOT DETECTED Final   Coronavirus 229E NOT DETECTED NOT DETECTED Final    Comment: (NOTE) The Coronavirus on the Respiratory Panel, DOES NOT test for the novel  Coronavirus (2019 nCoV)    Coronavirus HKU1 NOT DETECTED NOT DETECTED  Final   Coronavirus NL63 NOT DETECTED NOT DETECTED Final   Coronavirus OC43 NOT DETECTED NOT DETECTED Final   Metapneumovirus NOT DETECTED NOT DETECTED Final   Rhinovirus / Enterovirus NOT DETECTED NOT DETECTED Final   Influenza A NOT DETECTED NOT DETECTED Final   Influenza B NOT DETECTED NOT DETECTED Final   Parainfluenza Virus 1 NOT DETECTED NOT DETECTED Final   Parainfluenza Virus 2 NOT DETECTED NOT DETECTED Final   Parainfluenza Virus 3 NOT DETECTED NOT DETECTED Final   Parainfluenza Virus 4 NOT DETECTED NOT DETECTED Final   Respiratory Syncytial Virus NOT DETECTED NOT DETECTED Final   Bordetella pertussis NOT DETECTED NOT DETECTED Final   Bordetella Parapertussis NOT DETECTED NOT DETECTED Final   Chlamydophila pneumoniae NOT DETECTED NOT DETECTED Final   Mycoplasma pneumoniae NOT DETECTED NOT DETECTED Final    Comment: Performed at [facility], [address] [place], [place] [address]     Scheduled Meds:  fluticasone furoate-vilanterol  1 puff Inhalation Daily   gabapentin  300 mg Oral Daily   isosorbide mononitrate  60 mg Oral Daily   lactulose  20 g Oral BID   lidocaine  1 patch Transdermal Q24H   metoprolol tartrate  12.5 mg Oral BID   pantoprazole  80 mg Oral Daily   polyethylene glycol  17 g Oral Daily   senna-docusate  1 tablet Oral BID   sodium chloride flush  3 mL Intravenous Q12H   Continuous Infusions:  lactated ringers 100 mL/hr at 04/27/22 1257    Procedures/Studies: ECHOCARDIOGRAM COMPLETE  Result Date: 04/26/2022    ECHOCARDIOGRAM REPORT   Patient Name:   Carmelina Noun Date of Exam: 04/26/2022 Medical Rec #:  [id number]          Height:       67.0 in Accession #:    [id number]         Weight:       171.7 lb Date of Birth:  October 04, 1931          BSA:          1.895 m Patient Age:    42 years           BP:           150/69 mmHg Patient Gender: F                  HR:           80 bpm. Exam Location:  [facility] Procedure: 2D Echo Indications:    chest  pain.  History:  Patient has prior history of Echocardiogram examinations, most                 recent 12/27/2019. Risk Factors:Hypertension.  Sonographer:    Johny Chess RDCS Referring Phys: [phone] DAVID TAT IMPRESSIONS  1. Left ventricular ejection fraction, by estimation, is 65 to 70%. The left ventricle has normal function. The left ventricle has no regional wall motion abnormalities. There is mild left ventricular hypertrophy. Left ventricular diastolic parameters are consistent with Grade I diastolic dysfunction (impaired relaxation).  2. Right ventricular systolic function is normal. The right ventricular size is normal.  3. The mitral valve is normal in structure. No evidence of mitral valve regurgitation. No evidence of mitral stenosis.  4. The aortic valve is normal in structure. Aortic valve regurgitation is not visualized. No aortic stenosis is present.  5. The inferior vena cava is normal in size with greater than 50% respiratory variability, suggesting right atrial pressure of 3 mmHg. Comparison(s): No significant change from prior study. Prior images reviewed side by side. FINDINGS  Left Ventricle: Left ventricular ejection fraction, by estimation, is 65 to 70%. The left ventricle has normal function. The left ventricle has no regional wall motion abnormalities. The left ventricular internal cavity size was normal in size. There is  mild left ventricular hypertrophy. Left ventricular diastolic parameters are consistent with Grade I diastolic dysfunction (impaired relaxation). Right Ventricle: The right ventricular size is normal. No increase in right ventricular wall thickness. Right ventricular systolic function is normal. Left Atrium: Left atrial size was normal in size. Right Atrium: Right atrial size was normal in size. Pericardium: There is no evidence of pericardial effusion. Mitral Valve: The mitral valve is normal in structure. No evidence of mitral valve regurgitation. No evidence of  mitral valve stenosis. Tricuspid Valve: The tricuspid valve is normal in structure. Tricuspid valve regurgitation is not demonstrated. No evidence of tricuspid stenosis. Aortic Valve: The aortic valve is normal in structure. Aortic valve regurgitation is not visualized. No aortic stenosis is present. Pulmonic Valve: The pulmonic valve was normal in structure. Pulmonic valve regurgitation is trivial. No evidence of pulmonic stenosis. Aorta: The aortic root is normal in size and structure. Venous: The inferior vena cava is normal in size with greater than 50% respiratory variability, suggesting right atrial pressure of 3 mmHg. IAS/Shunts: No atrial level shunt detected by color flow Doppler.  LEFT VENTRICLE PLAX 2D LVIDd:         4.40 cm   Diastology LVIDs:         3.30 cm   LV e' medial:    6.09 cm/s LV PW:         1.30 cm   LV E/e' medial:  8.5 LV IVS:        0.90 cm   LV e' lateral:   9.90 cm/s LVOT diam:     2.00 cm   LV E/e' lateral: 5.3 LV SV:         53 LV SV Index:   28 LVOT Area:     3.14 cm  RIGHT VENTRICLE             IVC RV S prime:     23.90 cm/s  IVC diam: 1.90 cm TAPSE (M-mode): 1.6 cm LEFT ATRIUM             Index        RIGHT ATRIUM           Index LA diam:  4.00 cm 2.11 cm/m   RA Area:     14.60 cm LA Vol (A2C):   31.6 ml 16.67 ml/m  RA Volume:   37.10 ml  19.57 ml/m LA Vol (A4C):   33.5 ml 17.67 ml/m LA Biplane Vol: 33.4 ml 17.62 ml/m  AORTIC VALVE             PULMONIC VALVE LVOT Vmax:   94.10 cm/s  PR End Diast Vel: 7.18 msec LVOT Vmean:  65.100 cm/s LVOT VTI:    0.169 m  AORTA Ao Root diam: 3.10 cm Ao Asc diam:  3.20 cm MV E velocity: 52.00 cm/s MV A velocity: 123.00 cm/s  SHUNTS MV E/A ratio:  0.42         Systemic VTI:  0.17 m                             Systemic Diam: 2.00 cm Candee Furbish MD Electronically signed by Candee Furbish MD Signature Date/Time: 04/26/2022/2:53:41 PM    Final    DG Abd 2 Views  Result Date: 04/26/2022 CLINICAL DATA:  Abdominal distension and pain. History of  diabetes and hypertension. EXAM: ABDOMEN - 2 VIEW COMPARISON:  Plain film of the abdomen dated 11/16/2020 FINDINGS: No dilated large or small bowel loops are identified. No radiographic evidence of constipation. No evidence of soft tissue mass or abnormal fluid collection. No evidence of free intraperitoneal air. No evidence of renal or ureteral calculi. IMPRESSION: No acute findings. Nonobstructive bowel gas pattern. Electronically Signed   By: Franki Cabot M.D.   On: 04/26/2022 13:44   CT Angio Chest PE W and/or Wo Contrast  Result Date: 04/25/2022 CLINICAL DATA:  Pulmonary embolism (PE) suspected, positive D-dimer; Abdominal pain, acute, nonlocalized EXAM: CT ANGIOGRAPHY CHEST CT ABDOMEN AND PELVIS WITH CONTRAST TECHNIQUE: Multidetector CT imaging of the chest was performed using the standard protocol during bolus administration of intravenous contrast. Multiplanar CT image reconstructions and MIPs were obtained to evaluate the vascular anatomy. Multidetector CT imaging of the abdomen and pelvis was performed using the standard protocol during bolus administration of intravenous contrast. RADIATION DOSE REDUCTION: This exam was performed according to the departmental dose-optimization program which includes automated exposure control, adjustment of the mA and/or kV according to patient size and/or use of iterative reconstruction technique. CONTRAST:  114m OMNIPAQUE IOHEXOL 350 MG/ML SOLN COMPARISON:  CT abdomen pelvis 04/22/2022. FINDINGS: CTA CHEST FINDINGS Cardiovascular: Satisfactory opacification of the pulmonary arteries to the segmental level. No evidence of pulmonary embolism. Mild cardiomegaly.No pericardial disease. Coronary artery atherosclerosis. Moderate atherosclerosis of thoracic aorta. Mediastinum/Nodes: No lymphadenopathy. There is a 1.6 cm heterogeneous left thyroid nodule. Small hiatal hernia. Lungs/Pleura: Bibasilar hypoventilatory changes and atelectasis. No focal airspace consolidation.  There is a 4 mm ground-glass nodule in the left upper lobe (series 5, image 58). No follow-up imaging is recommended (SYSCOSociety 2017; Radiology 2017; [phone]. Musculoskeletal: There is a chronic T12 compression deformity with prior vertebroplasty. Review of the MIP images confirms the above findings. CT ABDOMEN and PELVIS FINDINGS Hepatobiliary: No focal liver abnormality is seen. The gallbladder is unremarkable. Pancreas: Unremarkable. No pancreatic ductal dilatation or surrounding inflammatory changes. Spleen: Normal in size without focal abnormality. Adrenals/Urinary Tract: Adrenal glands are unremarkable. No hydronephrosis or nephrolithiasis. There are tiny bilateral renal cysts. The bladder is moderately distended. Stomach/Bowel: Small hiatal hernia. The stomach is otherwise within normal limits. There is no evidence of bowel obstruction.The appendix is normal. There is  marked distension of the cecum and to a lesser degree the a hepatic flexure and transverse colon with air-fluid and fat fluid levels. There is no adjacent stranding.Colonic diverticulosis. No diverticulitis. Vascular/Lymphatic: Aortoiliac atherosclerosis. No AAA. No lymphadenopathy. Reproductive: Unremarkable. Other: Diastasis recti. Bilateral fat containing inguinal hernias. No bowel containing hernia. No ascites. No free air. Musculoskeletal: No acute osseous abnormality. No suspicious osseous lesion. Unchanged compression fracture of L2. No progressive height loss. Unchanged L4 vertebral body hemangioma. There is a new superior endplate compression fracture of L1 with approximately 15% height loss. Unchanged multilevel degenerative disc disease most severe at L5-S1 with trace retrolisthesis. Unchanged grade 1 anterolisthesis at L4-L5. Mild bilateral hip osteoarthritis. Review of the MIP images confirms the above findings. IMPRESSION: CT CHEST: No evidence of pulmonary embolism.  No focal airspace disease. 1.6 cm left thyroid  nodule. If not previously performed, recommend non-emergent thyroid ultrasound as an outpatient. Small hiatal hernia. CT ABDOMEN AND PELVIS: Distension of the cecum and to a lesser degree of the hepatic flexure and transverse colon with air-fluid and fat-fluid levels. No wall thickening or adjacent stranding to suggest colitis, however this could potentially be related the patient's pain/discomfort. Diverticulosis without diverticulitis. Normal appendix. No bowel obstruction. New superior endplate compression fracture of L1 with approximately 15 percent height loss. Unchanged T12 and L2 compression deformities. Electronically Signed   By: Maurine Simmering M.D.   On: 04/25/2022 12:01   CT ABDOMEN PELVIS W CONTRAST  Result Date: 04/25/2022 CLINICAL DATA:  Pulmonary embolism (PE) suspected, positive D-dimer; Abdominal pain, acute, nonlocalized EXAM: CT ANGIOGRAPHY CHEST CT ABDOMEN AND PELVIS WITH CONTRAST TECHNIQUE: Multidetector CT imaging of the chest was performed using the standard protocol during bolus administration of intravenous contrast. Multiplanar CT image reconstructions and MIPs were obtained to evaluate the vascular anatomy. Multidetector CT imaging of the abdomen and pelvis was performed using the standard protocol during bolus administration of intravenous contrast. RADIATION DOSE REDUCTION: This exam was performed according to the departmental dose-optimization program which includes automated exposure control, adjustment of the mA and/or kV according to patient size and/or use of iterative reconstruction technique. CONTRAST:  133m OMNIPAQUE IOHEXOL 350 MG/ML SOLN COMPARISON:  CT abdomen pelvis 04/22/2022. FINDINGS: CTA CHEST FINDINGS Cardiovascular: Satisfactory opacification of the pulmonary arteries to the segmental level. No evidence of pulmonary embolism. Mild cardiomegaly.No pericardial disease. Coronary artery atherosclerosis. Moderate atherosclerosis of thoracic aorta. Mediastinum/Nodes: No  lymphadenopathy. There is a 1.6 cm heterogeneous left thyroid nodule. Small hiatal hernia. Lungs/Pleura: Bibasilar hypoventilatory changes and atelectasis. No focal airspace consolidation. There is a 4 mm ground-glass nodule in the left upper lobe (series 5, image 58). No follow-up imaging is recommended (SYSCOSociety 2017; Radiology 2017; [phone]. Musculoskeletal: There is a chronic T12 compression deformity with prior vertebroplasty. Review of the MIP images confirms the above findings. CT ABDOMEN and PELVIS FINDINGS Hepatobiliary: No focal liver abnormality is seen. The gallbladder is unremarkable. Pancreas: Unremarkable. No pancreatic ductal dilatation or surrounding inflammatory changes. Spleen: Normal in size without focal abnormality. Adrenals/Urinary Tract: Adrenal glands are unremarkable. No hydronephrosis or nephrolithiasis. There are tiny bilateral renal cysts. The bladder is moderately distended. Stomach/Bowel: Small hiatal hernia. The stomach is otherwise within normal limits. There is no evidence of bowel obstruction.The appendix is normal. There is marked distension of the cecum and to a lesser degree the a hepatic flexure and transverse colon with air-fluid and fat fluid levels. There is no adjacent stranding.Colonic diverticulosis. No diverticulitis. Vascular/Lymphatic: Aortoiliac atherosclerosis. No AAA. No lymphadenopathy. Reproductive: Unremarkable. Other:  Diastasis recti. Bilateral fat containing inguinal hernias. No bowel containing hernia. No ascites. No free air. Musculoskeletal: No acute osseous abnormality. No suspicious osseous lesion. Unchanged compression fracture of L2. No progressive height loss. Unchanged L4 vertebral body hemangioma. There is a new superior endplate compression fracture of L1 with approximately 15% height loss. Unchanged multilevel degenerative disc disease most severe at L5-S1 with trace retrolisthesis. Unchanged grade 1 anterolisthesis at L4-L5. Mild  bilateral hip osteoarthritis. Review of the MIP images confirms the above findings. IMPRESSION: CT CHEST: No evidence of pulmonary embolism.  No focal airspace disease. 1.6 cm left thyroid nodule. If not previously performed, recommend non-emergent thyroid ultrasound as an outpatient. Small hiatal hernia. CT ABDOMEN AND PELVIS: Distension of the cecum and to a lesser degree of the hepatic flexure and transverse colon with air-fluid and fat-fluid levels. No wall thickening or adjacent stranding to suggest colitis, however this could potentially be related the patient's pain/discomfort. Diverticulosis without diverticulitis. Normal appendix. No bowel obstruction. New superior endplate compression fracture of L1 with approximately 15 percent height loss. Unchanged T12 and L2 compression deformities. Electronically Signed   By: Maurine Simmering M.D.   On: 04/25/2022 12:01   MR Lumbar Spine W Wo Contrast  Result Date: 04/25/2022 CLINICAL DATA:  Provided history: Low back pain, cauda equina syndrome suspected. EXAM: MRI LUMBAR SPINE WITHOUT AND WITH CONTRAST TECHNIQUE: Multiplanar and multiecho pulse sequences of the lumbar spine were obtained without and with intravenous contrast. CONTRAST:  47m GADAVIST GADOBUTROL 1 MMOL/ML IV SOLN COMPARISON:  CT abdomen/pelvis 04/22/2022. CT abdomen/pelvis 03/31/2019. Lumbar spine MRI 12/20/2021. FINDINGS: Segmentation: 5 lumbar vertebrae. The caudal most well-formed intervertebral disc space is designated L5-S1. Alignment: 2 mm grade 1 retrolisthesis at L1-L2. Unchanged 4 mm bony retropulsion at the level of the L4 inferior endplate. 3 mm L4-L5 grade 1 anterolisthesis. 3 mm L5-S1 grade 1 retrolisthesis. Vertebrae: Redemonstrated chronic T12 vertebral compression fracture (40-50% height loss) post vertebral augmentation. Acute L1 superior endplate vertebral compression fracture (approximately 20% height loss). Redemonstrated subacute on chronic L2 inferior endplate compression fracture.  Approximately 30% height loss at this level, unchanged from the prior CT abdomen/pelvis of 04/22/2022. 4 mm bony retropulsion at the level of the L2 inferior endplate, also unchanged. Marrow edema within the L1 vertebral body, greatest along the superior endplate. Marrow edema within the L2 vertebral body, greatest along the inferior endplate. Mild superior endplate edema also present at L3. There is an unchanged small Schmorl node within the right aspect of the L3 superior endplate without convincing superior endplate compression deformity. Marrow edema within the posterior elements at L3-L4, likely degenerative and related to facet arthrosis. Redemonstrated large L4 vertebral body hemangioma. Conus medullaris and cauda equina: Conus extends to the L2 level. No signal abnormality within the visualized distal spinal cord. Paraspinal and other soft tissues: Bilateral renal cysts. No paraspinal mass or collection. Disc levels: Unless otherwise stated, the level by level findings below have not significantly changed from the prior MRI of 12/20/2021. Multilevel disc degeneration, greatest at L1-L2 (moderate), L2-L3 (moderate) and L5-S1 (advanced). T11-T12: This level is imaged in the sagittal plane only. Disc bulge. Mild facet arthrosis. The disc bulge mildly effaces the ventral thecal sac and contacts the ventral aspect of the spinal cord. No significant foraminal stenosis. T12-L1: Disc bulge. Facet arthrosis (mild right, moderate left). Ligamentum flavum hypertrophy on the left. No significant spinal canal or foraminal stenosis. L1-L2: 2 mm grade 1 retrolisthesis. Disc bulge. Mild-to-moderate facet arthrosis. Mild to moderate bilateral subarticular narrowing (  without appreciable nerve root impingement). Mild narrowing of the central canal. Mild neural foraminal narrowing on the right. L2-L3: 4 mm bony retropulsion at the level of the L4 inferior endplate, new from the prior MRI of 12/20/2021. Progressive disc bulge.  Progressive advanced facet arthrosis with ligamentum flavum hypertrophy. Progressive severe central canal stenosis with bilateral subarticular narrowing. Bilateral neural foraminal narrowing (moderate/severe, mild/moderate left), progressed. L3-L4: Disc bulge. Moderate facet arthrosis with ligamentum flavum hypertrophy. Mild right subarticular narrowing (without appreciable nerve root impingement). Mild relative narrowing of the central canal. Mild right neural foraminal narrowing. L4-L5: 3 mm grade 1 anterolisthesis. Disc uncovering with disc bulge. Advanced facet arthrosis with ligamentum flavum hypertrophy. Bilateral subarticular narrowing (mild-to-moderate right, mild left) with medialization of the descending right L5 nerve root. Moderate central canal stenosis. Minimal relative left neural foraminal narrowing. L5-S1: 3 mm grade 1 retrolisthesis. Disc bulge with endplate osteophytes. Right center/subarticular posterior annular fissure. Facet arthrosis (mild right, moderate left) with ligamentum flavum hypertrophy. Right subarticular stenosis with impingement of the descending right S1 nerve root. Mild left subarticular narrowing (without appreciable nerve root impingement at this site). No significant central canal stenosis. Bilateral neural foraminal narrowing (mild right, moderate left). IMPRESSION: Acute L1 superior endplate vertebral compression fracture (approximately 20% height loss). No significant bony retropulsion at this level. Subacute on chronic L2 inferior endplate vertebral compression fracture. Approximately 30% height loss at this level, unchanged from the recent prior CT abdomen/pelvis of 04/22/2022. At L2-L3, 4 mm bony retropulsion at the level of the L2 inferior endplate and superimposed spondylosis contribute to progressive severe central canal stenosis with bilateral subarticular narrowing. Bilateral neural foraminal narrowing (moderate/severe right, mild-to-moderate left), also progressed.  Mild marrow edema along the L3 superior endplate. Unchanged small Schmorl node within the right aspect of the L3 superior endplate, but no convincing L3 superior endplate compression fracture. Unchanged chronic T12 vertebral compression fracture status post vertebral augmentation. Additional lumbar spondylosis, as outlined and otherwise unchanged from the prior MRI of 12/20/2021. At these remaining levels, spondylosis is greatest at L1-L2, L4-L5 and L5-S1. Electronically Signed   By: Kellie Simmering D.O.   On: 04/25/2022 11:01   DG INJECT DIAG/THERA/INC NEEDLE/CATH/PLC EPI/LUMB/SAC W/IMG  Result Date: 04/06/2022 CLINICAL DATA:  Lumbosacral spondylosis without myelopathy. Moderate spinal stenosis at L4-5. Sacrococcygeal and right leg pain. The patient also has a subacute L2 compression fracture, however she denies significant upper lumbar back pain. FLUOROSCOPY: Radiation Exposure Index (as provided by the fluoroscopic device): 4.50 mGy Kerma PROCEDURE: The procedure, risks, benefits, and alternatives were explained to the patient. Questions regarding the procedure were encouraged and answered. The patient understands and consents to the procedure. LUMBAR EPIDURAL INJECTION: An interlaminar approach was performed on the right at L5-S1. The overlying skin was cleansed and anesthetized. A 3.5 inch 20 gauge epidural needle was advanced using loss-of-resistance technique. DIAGNOSTIC EPIDURAL INJECTION: Injection of Isovue-M 200 shows a good epidural pattern with spread above and below the level of needle placement, primarily on the right. No vascular opacification is seen. THERAPEUTIC EPIDURAL INJECTION: 80 mg of Depo-Medrol mixed with 2 mL of 1% lidocaine were instilled. The injection reproduced the patient's sacrococcygeal and right leg pain. The procedure was well-tolerated, and the patient was discharged thirty minutes following the injection in good condition. COMPLICATIONS: None immediate IMPRESSION: Technically  successful interlaminar epidural injection on the right at L5-S1. Electronically Signed   By: Logan Bores M.D.   On: 04/06/2022 13:29   CT Renal Stone Study  Result Date: 03/30/2022 CLINICAL  DATA:  Flank pain with kidney stone suspected. Constipation for 3 days EXAM: CT ABDOMEN AND PELVIS WITHOUT CONTRAST TECHNIQUE: Multidetector CT imaging of the abdomen and pelvis was performed following the standard protocol without IV contrast. RADIATION DOSE REDUCTION: This exam was performed according to the departmental dose-optimization program which includes automated exposure control, adjustment of the mA and/or kV according to patient size and/or use of iterative reconstruction technique. COMPARISON:  01/27/2022 FINDINGS: Lower chest:  Small sliding hiatal hernia Hepatobiliary: Prominent liver fissures and caudate lobe size but no definite surface lobulation for cirrhosis.No evidence of biliary obstruction or stone. Pancreas: Generalized atrophy Spleen: Unremarkable. Adrenals/Urinary Tract: Negative adrenals. No hydronephrosis or stone. Unremarkable bladder. Stomach/Bowel: No obstruction. Left colonic diverticulosis. No visible bowel inflammation. Vascular/Lymphatic: Atheromatous calcification of the aorta and iliacs. No mass or adenopathy. Reproductive:No pathologic findings. 13 mm simple appearing right ovarian cyst. No follow-up imaging recommended. Note: This recommendation does not apply to premenarchal patients and to those with increased risk (genetic, family history, elevated tumor markers or other high-risk factors) of ovarian cancer. Reference: JACR 2020 Feb; 17(2):248-254 Other: No ascites or pneumoperitoneum. Homogeneous nodule along the posterior aspect of the right diaphragm, benign given multi year stability Musculoskeletal: L2 compression fracture with inferior endplate depression and sclerosis. Fracture lines are still readily apparent and there is mild paravertebral edema. Some callus is likely  present and there is vacuum phenomenon in the adjacent disc space. Remote T12 compression fracture with cement augmentation. Generalized osteopenia and lumbar spine degeneration with mild L4-5 anterolisthesis. Sizable hemangioma at the L4 body. IMPRESSION: 1. Subacute/un-healed L2 compression fracture with mild height loss. 2. No acute intra-abdominal finding. 3. Atherosclerosis and colonic diverticulosis. Electronically Signed   By: Jorje Guild M.D.   On: 03/30/2022 05:27    Kathie Dike, MD  [facility]  If 7PM-7AM, please contact night-coverage [email or web address]  04/27/2022, 6:27 PM   LOS: 1 day

## 2022-04-27 NOTE — TOC Progression Note (Signed)
Transition of Care [facility]) - Progression Note    Patient Details  Name: Heather [name] MRN: [id number] Date of Birth: Aug 21, 1931  [facility] CM/SW Contact  Boneta Lucks, RN Phone Number: 04/27/2022, 11:01 AM  Clinical Narrative:   [facility] offered, Daughter is accepting.  [facility] updated and approved [id number] [id number]. DC planning for tomorrow.    Expected Discharge Plan: [place] Barriers to Discharge: Continued Medical Work up  Expected Discharge Plan and Services Expected Discharge Plan: [place] In-house Referral: Clinical Social Work   Post Acute Care Choice: [place] Living arrangements for the past 2 months: [place]

## 2022-04-28 ENCOUNTER — Telehealth: Payer: Self-pay | Admitting: Gastroenterology

## 2022-04-28 DIAGNOSIS — J9601 Acute respiratory failure with hypoxia: Secondary | ICD-10-CM | POA: Diagnosis not present

## 2022-04-28 DIAGNOSIS — R072 Precordial pain: Secondary | ICD-10-CM | POA: Diagnosis not present

## 2022-04-28 DIAGNOSIS — F419 Anxiety disorder, unspecified: Secondary | ICD-10-CM | POA: Diagnosis not present

## 2022-04-28 DIAGNOSIS — S32010A Wedge compression fracture of first lumbar vertebra, initial encounter for closed fracture: Secondary | ICD-10-CM | POA: Diagnosis not present

## 2022-04-28 LAB — CBC
HCT: 40.4 % (ref 36.0–46.0)
Hemoglobin: 13.5 g/dL (ref 12.0–15.0)
MCH: 30.1 pg (ref 26.0–34.0)
MCHC: 33.4 g/dL (ref 30.0–36.0)
MCV: 90 fL (ref 80.0–100.0)
Platelets: 143 10*3/uL — ABNORMAL LOW (ref 150–400)
RBC: 4.49 MIL/uL (ref 3.87–5.11)
RDW: 12.6 % (ref 11.5–15.5)
WBC: 8.7 10*3/uL (ref 4.0–10.5)
nRBC: 0 % (ref 0.0–0.2)

## 2022-04-28 LAB — BASIC METABOLIC PANEL
Anion gap: 6 (ref 5–15)
BUN: 13 mg/dL (ref 8–23)
CO2: 24 mmol/L (ref 22–32)
Calcium: 8.7 mg/dL — ABNORMAL LOW (ref 8.9–10.3)
Chloride: 108 mmol/L (ref 98–111)
Creatinine, Ser: 0.65 mg/dL (ref 0.44–1.00)
GFR, Estimated: >60 mL/min (ref >60)
Glucose, Bld: 95 mg/dL (ref 70–99)
Potassium: 3.7 mmol/L (ref 3.5–5.1)
Sodium: 138 mmol/L (ref 135–145)

## 2022-04-28 MED ORDER — METOPROLOL TARTRATE 25 MG PO TABS
25.0000 mg | ORAL_TABLET | Freq: Two times a day (BID) | ORAL | Status: DC
  Administered 2022-04-28 – 2022-04-29 (×2): 25 mg via ORAL
  Filled 2022-04-28 (×2): qty 1

## 2022-04-28 MED ORDER — SENNOSIDES-DOCUSATE SODIUM 8.6-50 MG PO TABS: 1.0000 | ORAL_TABLET | Freq: Two times a day (BID) | ORAL | Status: AC

## 2022-04-28 MED ORDER — ALPRAZOLAM 0.5 MG PO TABS: 1.0000 mg | ORAL_TABLET | Freq: Three times a day (TID) | ORAL | 0 refills | Status: DC

## 2022-04-28 MED ORDER — QUETIAPINE FUMARATE 25 MG PO TABS
12.5000 mg | ORAL_TABLET | Freq: Every day | ORAL | Status: DC
  Administered 2022-04-28: 12.5 mg via ORAL
  Filled 2022-04-28: qty 1

## 2022-04-28 MED ORDER — METOPROLOL TARTRATE 5 MG/5ML IV SOLN
5.0000 mg | INTRAVENOUS | Status: AC
Start: 2022-04-28 — End: 2022-04-28
  Administered 2022-04-28: 5 mg via INTRAVENOUS
  Filled 2022-04-28: qty 5

## 2022-04-28 MED ORDER — POTASSIUM CHLORIDE CRYS ER 20 MEQ PO TBCR
40.0000 meq | EXTENDED_RELEASE_TABLET | Freq: Once | ORAL | Status: AC
  Administered 2022-04-28: 40 meq via ORAL
  Filled 2022-04-28: qty 2

## 2022-04-28 MED ORDER — POLYETHYLENE GLYCOL 3350 17 G PO PACK: 17.0000 g | PACK | Freq: Every day | ORAL | 0 refills | Status: AC | PRN

## 2022-04-28 MED ORDER — LIDOCAINE 5 % EX PTCH: 1.0000 | MEDICATED_PATCH | CUTANEOUS | 0 refills | Status: DC

## 2022-04-28 MED ORDER — HYDROCODONE-ACETAMINOPHEN 5-325 MG PO TABS: 1.0000 | ORAL_TABLET | Freq: Four times a day (QID) | ORAL | 0 refills | Status: DC | PRN

## 2022-04-28 NOTE — Progress Notes (Signed)
PROGRESS NOTE  Heather [name] YQI:[id number] DOB: February 10, 1931 DOA: 04/25/2022 PCP: Glenda Chroman, MD  Brief History:  88 year old female with history of hypertension, hyperlipidemia, anxiety, GERD, IBS presenting with abdominal pain in the lower abdomen.  Notably, the patient was seen at [facility] ED on 04/22/2022 with complaints of urinary frequency and lower abdominal pain and low back pain.  At that time, the patient stated that she was unable to stop urinating.   CT of the abdomen and pelvis was obtained during that ED visit and showed no acute process.  Specifically the CT also showed L2 compression fracture I was present from CT earlier in the month.  There was diverticulosis without any diverticulitis and bilateral inguinal hernias without incarceration.  The patient was given IV morphine, Zofran, and 1 L of IV fluids.  With improvement of her pain.  During that ED's visit, WBC was 12.0 with hemoglobin 16.6 and platelets 246,000.  Her BMP was unremarkable with sodium 140, potassium 3.5, bicarbonate 29, and serum creatinine 0.86.  UA was negative for any pyuria or hematuria at that time.  She was discharged home in stable condition.She complained of sharp pains in her lower abdomen and radiating to her back without any fevers or chills.  Further history reveals that the patient has been struggling with constipation.  She states that she had not had a bowel movement for about 2 weeks.  As result, she had noted increasing abdominal pain over that period of time.  She denies any hematochezia, melena, nausea, vomiting.  There is no dysuria, but she has complained of urinary frequency.  There is no hematuria.  She denies any fevers or chills.  She stated that the pain was primarily in the lower abdomen right greater than left. In addition, the patient states that she has had 3 falls in the past month, most recently about 2 weeks prior to this admission.  There is no syncope.  Apparently she was in SNF  rehab about 2 months ago. ED Upon admission to the emergency department patient was seen to be afebrile with O2 saturations as low as 88% with improvement on 2 L nasal cannula oxygen.  Labs noted WBC 10.5, hemoglobin 16.4, high-sensitivity troponins negative x2, and D-dimer 1.87.  Urinalysis did not show any significant signs of infection.  CT angiogram of the chest abdomen pelvis noted new L1 compression fracture with chronic L2 compression fracture, distention of the cecum without signs of infection.     Assessment and Plan: * Closed compression fracture of body of L1 vertebra (HCC) Noted on CTA abdomen -Secondary to mechanical falls -Judicious opioids -PT evaluation with recommendations for SNF -CT also noted previous compression fractures T12 and L2.  Acute respiratory failure with hypoxia (HCC) Oxygen saturation 86% on room air with shortness of breath. 730 CTA chest-- negative for PE--shows bibasilar atelectasis.  There is no consolidation.  There is GGO in the left upper lobe. -PCT negative -Suspect possible component of atelectatic disease  -Viral respiratory panel negative -Stable on 2 L presently  Constipation Abdominal pain seems to be improving after bowel movements here in the hospital -Continue MiraLAX, lactulose and Senokot  Essential hypertension Metoprolol increased to home dose of 25 mg twice daily  Anxiety PDMP reviewed -- Patient receives Xanax 1 mg, #60, last refilled 04/06/2022 -Family feels that she may be oversedated at home, Xanax decreased to 0.5 mg  GERD (gastroesophageal reflux disease) Continue pantoprazole  Chronic back  pain PDMP reviewed -- Patient receives tramadol # 60, last refilled 03/30/2022 -Continue gabapentin -Also previously had a prescription for hydrocodone 5/325, #20 on 02/09/2022  Abnormal CT of the abdomen 05/22/2022 CT abdomen--marked distention of the cecum extending to the hepatic flexure and transverse colon with air-fluid  levels -Suspect this may be due to ileus vs. Psbo from the patient's constipation -GI following -Patient is now having bowel movements -Plans are for outpatient colonoscopy  Chest pain Atypical by clinical history -CTA chest negative for PE -Reproducible on exam by palpation -Echocardiogram shows preserved EF, no WMA -Troponin 3>>> 3 -Personally reviewed EKG--sinus rhythm, nonspecific T wave change      Family Communication:  updated daughter at the bedside  Consultants:  GI  Code Status:  FULL  DVT Prophylaxis:  SCDs   Procedures: As Listed in Progress Note Above  Antibiotics: None     Subjective: Per staff, she had multiple bowel movements today.  She denies any complaints today.  Objective: Vitals:   04/27/22 2023 04/28/22 0526 04/28/22 1314 04/28/22 1829  BP: (!) 144/58 (!) 172/62 (!) 140/58 (!) 153/83  Pulse: 82 79 73 (!) 126  Resp:  '16 17 17  '$ Temp:  97.6 F (36.4 C) (!) 97.5 F (36.4 C)   TempSrc:   Oral   SpO2:  95% 94% 95%  Weight:      Height:        Intake/Output Summary (Last 24 hours) at 04/28/2022 2026 Last data filed at 04/28/2022 1015 Gross per 24 hour  Intake 3 ml  Output 650 ml  Net -647 ml   Weight change:  Exam:  General:  Pt is alert, follows commands appropriately, not in acute distress HEENT: No icterus, No thrush, No neck mass, [place]/AT Cardiovascular: RRR, S1/S2, no rubs, no gallops Respiratory: Bibasilar crackles.  No wheezing.  Good air movement. Abdomen: Soft/+BS, RLQ tender, non distended, no guarding Extremities: No edema, No lymphangitis, No petechiae, No rashes, no synovitis   Data Reviewed: I have personally reviewed following labs and imaging studies Basic Metabolic Panel: Recent Labs  Lab 04/25/22 0902 04/26/22 0445 04/27/22 0440 04/28/22 0405  NA 137 136 139 138  K 3.7 3.9 3.6 3.7  CL 100 99 105 108  CO2 '28 27 27 24  '$ GLUCOSE 100* 129* 80 95  BUN '19 19 12 13  '$ CREATININE 0.82 0.97 0.74 0.65  CALCIUM 9.5  8.9 8.3* 8.7*  MG 2.4  --   --   --    Liver Function Tests: Recent Labs  Lab 04/25/22 0902  AST 22  ALT 21  ALKPHOS 94  BILITOT 0.5  PROT 8.0  ALBUMIN 4.0   Recent Labs  Lab 04/25/22 0902  LIPASE 26   Recent Labs  Lab 04/26/22 1733  AMMONIA 23   Coagulation Profile: No results for input(s): "INR", "PROTIME" in the last 168 hours. CBC: Recent Labs  Lab 04/25/22 0902 04/26/22 0445 04/27/22 0440 04/28/22 0405  WBC 10.5 12.0* 8.3 8.7  NEUTROABS 6.6  --   --   --   HGB 16.4* 15.1* 12.9 13.5  HCT 48.8* 45.6 40.1 40.4  MCV 88.2 89.4 91.1 90.0  PLT 208 194 145* 143*   Cardiac Enzymes: No results for input(s): "CKTOTAL", "CKMB", "CKMBINDEX", "TROPONINI" in the last 168 hours. BNP: Invalid input(s): "POCBNP" CBG: No results for input(s): "GLUCAP" in the last 168 hours. HbA1C: No results for input(s): "HGBA1C" in the last 72 hours. Urine analysis:    Component Value Date/Time  COLORURINE YELLOW 04/25/2022 0852   APPEARANCEUR HAZY (A) 04/25/2022 0852   LABSPEC 1.017 04/25/2022 0852   PHURINE 7.0 04/25/2022 [place] 04/25/2022 0852   HGBUR NEGATIVE 04/25/2022 0852   BILIRUBINUR NEGATIVE 04/25/2022 [place] 04/25/2022 0852   PROTEINUR NEGATIVE 04/25/2022 0852   NITRITE NEGATIVE 04/25/2022 0852   LEUKOCYTESUR NEGATIVE 04/25/2022 0852   Sepsis Labs: '@LABRCNTIP'$ (procalcitonin:4,lacticidven:4) ) Recent Results (from the past 240 hour(s))  Respiratory (~20 pathogens) panel by PCR     Status: None   Collection Time: 04/26/22  5:57 PM   Specimen: Nasopharyngeal Swab; Respiratory  Result Value Ref Range Status   Adenovirus NOT DETECTED NOT DETECTED Final   Coronavirus 229E NOT DETECTED NOT DETECTED Final    Comment: (NOTE) The Coronavirus on the Respiratory Panel, DOES NOT test for the novel  Coronavirus (2019 nCoV)    Coronavirus HKU1 NOT DETECTED NOT DETECTED Final   Coronavirus NL63 NOT DETECTED NOT DETECTED Final    Coronavirus OC43 NOT DETECTED NOT DETECTED Final   Metapneumovirus NOT DETECTED NOT DETECTED Final   Rhinovirus / Enterovirus NOT DETECTED NOT DETECTED Final   Influenza A NOT DETECTED NOT DETECTED Final   Influenza B NOT DETECTED NOT DETECTED Final   Parainfluenza Virus 1 NOT DETECTED NOT DETECTED Final   Parainfluenza Virus 2 NOT DETECTED NOT DETECTED Final   Parainfluenza Virus 3 NOT DETECTED NOT DETECTED Final   Parainfluenza Virus 4 NOT DETECTED NOT DETECTED Final   Respiratory Syncytial Virus NOT DETECTED NOT DETECTED Final   Bordetella pertussis NOT DETECTED NOT DETECTED Final   Bordetella Parapertussis NOT DETECTED NOT DETECTED Final   Chlamydophila pneumoniae NOT DETECTED NOT DETECTED Final   Mycoplasma pneumoniae NOT DETECTED NOT DETECTED Final    Comment: Performed at [facility], [place] [address], [place], [place] [address]     Scheduled Meds:  fluticasone furoate-vilanterol  1 puff Inhalation Daily   gabapentin  300 mg Oral Daily   isosorbide mononitrate  60 mg Oral Daily   lactulose  20 g Oral BID   lidocaine  1 patch Transdermal Q24H   metoprolol tartrate  25 mg Oral BID   pantoprazole  80 mg Oral Daily   polyethylene glycol  17 g Oral Daily   QUEtiapine  12.5 mg Oral QHS   senna-docusate  1 tablet Oral BID   sodium chloride flush  3 mL Intravenous Q12H   Continuous Infusions:    Procedures/Studies: ECHOCARDIOGRAM COMPLETE  Result Date: 04/26/2022    ECHOCARDIOGRAM REPORT   Patient Name:   Heather [name] Date of Exam: 04/26/2022 Medical Rec #:  [id number]          Height:       67.0 in Accession #:    [id number]         Weight:       171.7 lb Date of Birth:  Apr 30, 1931          BSA:          1.895 m Patient Age:    78 years           BP:           150/69 mmHg Patient Gender: F                  HR:           80 bpm. Exam Location:  [facility] Procedure: 2D Echo Indications:    chest pain.  History:  Patient has prior history of Echocardiogram  examinations, most                 recent 12/27/2019. Risk Factors:Hypertension.  Sonographer:    Johny Chess RDCS Referring Phys: [phone] DAVID TAT IMPRESSIONS  1. Left ventricular ejection fraction, by estimation, is 65 to 70%. The left ventricle has normal function. The left ventricle has no regional wall motion abnormalities. There is mild left ventricular hypertrophy. Left ventricular diastolic parameters are consistent with Grade I diastolic dysfunction (impaired relaxation).  2. Right ventricular systolic function is normal. The right ventricular size is normal.  3. The mitral valve is normal in structure. No evidence of mitral valve regurgitation. No evidence of mitral stenosis.  4. The aortic valve is normal in structure. Aortic valve regurgitation is not visualized. No aortic stenosis is present.  5. The inferior vena cava is normal in size with greater than 50% respiratory variability, suggesting right atrial pressure of 3 mmHg. Comparison(s): No significant change from prior study. Prior images reviewed side by side. FINDINGS  Left Ventricle: Left ventricular ejection fraction, by estimation, is 65 to 70%. The left ventricle has normal function. The left ventricle has no regional wall motion abnormalities. The left ventricular internal cavity size was normal in size. There is  mild left ventricular hypertrophy. Left ventricular diastolic parameters are consistent with Grade I diastolic dysfunction (impaired relaxation). Right Ventricle: The right ventricular size is normal. No increase in right ventricular wall thickness. Right ventricular systolic function is normal. Left Atrium: Left atrial size was normal in size. Right Atrium: Right atrial size was normal in size. Pericardium: There is no evidence of pericardial effusion. Mitral Valve: The mitral valve is normal in structure. No evidence of mitral valve regurgitation. No evidence of mitral valve stenosis. Tricuspid Valve: The tricuspid valve is  normal in structure. Tricuspid valve regurgitation is not demonstrated. No evidence of tricuspid stenosis. Aortic Valve: The aortic valve is normal in structure. Aortic valve regurgitation is not visualized. No aortic stenosis is present. Pulmonic Valve: The pulmonic valve was normal in structure. Pulmonic valve regurgitation is trivial. No evidence of pulmonic stenosis. Aorta: The aortic root is normal in size and structure. Venous: The inferior vena cava is normal in size with greater than 50% respiratory variability, suggesting right atrial pressure of 3 mmHg. IAS/Shunts: No atrial level shunt detected by color flow Doppler.  LEFT VENTRICLE PLAX 2D LVIDd:         4.40 cm   Diastology LVIDs:         3.30 cm   LV e' medial:    6.09 cm/s LV PW:         1.30 cm   LV E/e' medial:  8.5 LV IVS:        0.90 cm   LV e' lateral:   9.90 cm/s LVOT diam:     2.00 cm   LV E/e' lateral: 5.3 LV SV:         53 LV SV Index:   28 LVOT Area:     3.14 cm  RIGHT VENTRICLE             IVC RV S prime:     23.90 cm/s  IVC diam: 1.90 cm TAPSE (M-mode): 1.6 cm LEFT ATRIUM             Index        RIGHT ATRIUM           Index LA diam:  4.00 cm 2.11 cm/m   RA Area:     14.60 cm LA Vol (A2C):   31.6 ml 16.67 ml/m  RA Volume:   37.10 ml  19.57 ml/m LA Vol (A4C):   33.5 ml 17.67 ml/m LA Biplane Vol: 33.4 ml 17.62 ml/m  AORTIC VALVE             PULMONIC VALVE LVOT Vmax:   94.10 cm/s  PR End Diast Vel: 7.18 msec LVOT Vmean:  65.100 cm/s LVOT VTI:    0.169 m  AORTA Ao Root diam: 3.10 cm Ao Asc diam:  3.20 cm MV E velocity: 52.00 cm/s MV A velocity: 123.00 cm/s  SHUNTS MV E/A ratio:  0.42         Systemic VTI:  0.17 m                             Systemic Diam: 2.00 cm Candee Furbish MD Electronically signed by Candee Furbish MD Signature Date/Time: 04/26/2022/2:53:41 PM    Final    DG Abd 2 Views  Result Date: 04/26/2022 CLINICAL DATA:  Abdominal distension and pain. History of diabetes and hypertension. EXAM: ABDOMEN - 2 VIEW COMPARISON:   Plain film of the abdomen dated 11/16/2020 FINDINGS: No dilated large or small bowel loops are identified. No radiographic evidence of constipation. No evidence of soft tissue mass or abnormal fluid collection. No evidence of free intraperitoneal air. No evidence of renal or ureteral calculi. IMPRESSION: No acute findings. Nonobstructive bowel gas pattern. Electronically Signed   By: Franki Cabot M.D.   On: 04/26/2022 13:44   CT Angio Chest PE W and/or Wo Contrast  Result Date: 04/25/2022 CLINICAL DATA:  Pulmonary embolism (PE) suspected, positive D-dimer; Abdominal pain, acute, nonlocalized EXAM: CT ANGIOGRAPHY CHEST CT ABDOMEN AND PELVIS WITH CONTRAST TECHNIQUE: Multidetector CT imaging of the chest was performed using the standard protocol during bolus administration of intravenous contrast. Multiplanar CT image reconstructions and MIPs were obtained to evaluate the vascular anatomy. Multidetector CT imaging of the abdomen and pelvis was performed using the standard protocol during bolus administration of intravenous contrast. RADIATION DOSE REDUCTION: This exam was performed according to the departmental dose-optimization program which includes automated exposure control, adjustment of the mA and/or kV according to patient size and/or use of iterative reconstruction technique. CONTRAST:  138m OMNIPAQUE IOHEXOL 350 MG/ML SOLN COMPARISON:  CT abdomen pelvis 04/22/2022. FINDINGS: CTA CHEST FINDINGS Cardiovascular: Satisfactory opacification of the pulmonary arteries to the segmental level. No evidence of pulmonary embolism. Mild cardiomegaly.No pericardial disease. Coronary artery atherosclerosis. Moderate atherosclerosis of thoracic aorta. Mediastinum/Nodes: No lymphadenopathy. There is a 1.6 cm heterogeneous left thyroid nodule. Small hiatal hernia. Lungs/Pleura: Bibasilar hypoventilatory changes and atelectasis. No focal airspace consolidation. There is a 4 mm ground-glass nodule in the left upper lobe  (series 5, image 58). No follow-up imaging is recommended (SYSCOSociety 2017; Radiology 2017; [phone]. Musculoskeletal: There is a chronic T12 compression deformity with prior vertebroplasty. Review of the MIP images confirms the above findings. CT ABDOMEN and PELVIS FINDINGS Hepatobiliary: No focal liver abnormality is seen. The gallbladder is unremarkable. Pancreas: Unremarkable. No pancreatic ductal dilatation or surrounding inflammatory changes. Spleen: Normal in size without focal abnormality. Adrenals/Urinary Tract: Adrenal glands are unremarkable. No hydronephrosis or nephrolithiasis. There are tiny bilateral renal cysts. The bladder is moderately distended. Stomach/Bowel: Small hiatal hernia. The stomach is otherwise within normal limits. There is no evidence of bowel obstruction.The appendix is normal. There is  marked distension of the cecum and to a lesser degree the a hepatic flexure and transverse colon with air-fluid and fat fluid levels. There is no adjacent stranding.Colonic diverticulosis. No diverticulitis. Vascular/Lymphatic: Aortoiliac atherosclerosis. No AAA. No lymphadenopathy. Reproductive: Unremarkable. Other: Diastasis recti. Bilateral fat containing inguinal hernias. No bowel containing hernia. No ascites. No free air. Musculoskeletal: No acute osseous abnormality. No suspicious osseous lesion. Unchanged compression fracture of L2. No progressive height loss. Unchanged L4 vertebral body hemangioma. There is a new superior endplate compression fracture of L1 with approximately 15% height loss. Unchanged multilevel degenerative disc disease most severe at L5-S1 with trace retrolisthesis. Unchanged grade 1 anterolisthesis at L4-L5. Mild bilateral hip osteoarthritis. Review of the MIP images confirms the above findings. IMPRESSION: CT CHEST: No evidence of pulmonary embolism.  No focal airspace disease. 1.6 cm left thyroid nodule. If not previously performed, recommend non-emergent  thyroid ultrasound as an outpatient. Small hiatal hernia. CT ABDOMEN AND PELVIS: Distension of the cecum and to a lesser degree of the hepatic flexure and transverse colon with air-fluid and fat-fluid levels. No wall thickening or adjacent stranding to suggest colitis, however this could potentially be related the patient's pain/discomfort. Diverticulosis without diverticulitis. Normal appendix. No bowel obstruction. New superior endplate compression fracture of L1 with approximately 15 percent height loss. Unchanged T12 and L2 compression deformities. Electronically Signed   By: Maurine Simmering M.D.   On: 04/25/2022 12:01   CT ABDOMEN PELVIS W CONTRAST  Result Date: 04/25/2022 CLINICAL DATA:  Pulmonary embolism (PE) suspected, positive D-dimer; Abdominal pain, acute, nonlocalized EXAM: CT ANGIOGRAPHY CHEST CT ABDOMEN AND PELVIS WITH CONTRAST TECHNIQUE: Multidetector CT imaging of the chest was performed using the standard protocol during bolus administration of intravenous contrast. Multiplanar CT image reconstructions and MIPs were obtained to evaluate the vascular anatomy. Multidetector CT imaging of the abdomen and pelvis was performed using the standard protocol during bolus administration of intravenous contrast. RADIATION DOSE REDUCTION: This exam was performed according to the departmental dose-optimization program which includes automated exposure control, adjustment of the mA and/or kV according to patient size and/or use of iterative reconstruction technique. CONTRAST:  173m OMNIPAQUE IOHEXOL 350 MG/ML SOLN COMPARISON:  CT abdomen pelvis 04/22/2022. FINDINGS: CTA CHEST FINDINGS Cardiovascular: Satisfactory opacification of the pulmonary arteries to the segmental level. No evidence of pulmonary embolism. Mild cardiomegaly.No pericardial disease. Coronary artery atherosclerosis. Moderate atherosclerosis of thoracic aorta. Mediastinum/Nodes: No lymphadenopathy. There is a 1.6 cm heterogeneous left thyroid  nodule. Small hiatal hernia. Lungs/Pleura: Bibasilar hypoventilatory changes and atelectasis. No focal airspace consolidation. There is a 4 mm ground-glass nodule in the left upper lobe (series 5, image 58). No follow-up imaging is recommended (SYSCOSociety 2017; Radiology 2017; [id number]. Musculoskeletal: There is a chronic T12 compression deformity with prior vertebroplasty. Review of the MIP images confirms the above findings. CT ABDOMEN and PELVIS FINDINGS Hepatobiliary: No focal liver abnormality is seen. The gallbladder is unremarkable. Pancreas: Unremarkable. No pancreatic ductal dilatation or surrounding inflammatory changes. Spleen: Normal in size without focal abnormality. Adrenals/Urinary Tract: Adrenal glands are unremarkable. No hydronephrosis or nephrolithiasis. There are tiny bilateral renal cysts. The bladder is moderately distended. Stomach/Bowel: Small hiatal hernia. The stomach is otherwise within normal limits. There is no evidence of bowel obstruction.The appendix is normal. There is marked distension of the cecum and to a lesser degree the a hepatic flexure and transverse colon with air-fluid and fat fluid levels. There is no adjacent stranding.Colonic diverticulosis. No diverticulitis. Vascular/Lymphatic: Aortoiliac atherosclerosis. No AAA. No lymphadenopathy. Reproductive: Unremarkable. Other:  Diastasis recti. Bilateral fat containing inguinal hernias. No bowel containing hernia. No ascites. No free air. Musculoskeletal: No acute osseous abnormality. No suspicious osseous lesion. Unchanged compression fracture of L2. No progressive height loss. Unchanged L4 vertebral body hemangioma. There is a new superior endplate compression fracture of L1 with approximately 15% height loss. Unchanged multilevel degenerative disc disease most severe at L5-S1 with trace retrolisthesis. Unchanged grade 1 anterolisthesis at L4-L5. Mild bilateral hip osteoarthritis. Review of the MIP images confirms  the above findings. IMPRESSION: CT CHEST: No evidence of pulmonary embolism.  No focal airspace disease. 1.6 cm left thyroid nodule. If not previously performed, recommend non-emergent thyroid ultrasound as an outpatient. Small hiatal hernia. CT ABDOMEN AND PELVIS: Distension of the cecum and to a lesser degree of the hepatic flexure and transverse colon with air-fluid and fat-fluid levels. No wall thickening or adjacent stranding to suggest colitis, however this could potentially be related the patient's pain/discomfort. Diverticulosis without diverticulitis. Normal appendix. No bowel obstruction. New superior endplate compression fracture of L1 with approximately 15 percent height loss. Unchanged T12 and L2 compression deformities. Electronically Signed   By: Maurine Simmering M.D.   On: 04/25/2022 12:01   MR Lumbar Spine W Wo Contrast  Result Date: 04/25/2022 CLINICAL DATA:  Provided history: Low back pain, cauda equina syndrome suspected. EXAM: MRI LUMBAR SPINE WITHOUT AND WITH CONTRAST TECHNIQUE: Multiplanar and multiecho pulse sequences of the lumbar spine were obtained without and with intravenous contrast. CONTRAST:  47m GADAVIST GADOBUTROL 1 MMOL/ML IV SOLN COMPARISON:  CT abdomen/pelvis 04/22/2022. CT abdomen/pelvis 03/31/2019. Lumbar spine MRI 12/20/2021. FINDINGS: Segmentation: 5 lumbar vertebrae. The caudal most well-formed intervertebral disc space is designated L5-S1. Alignment: 2 mm grade 1 retrolisthesis at L1-L2. Unchanged 4 mm bony retropulsion at the level of the L4 inferior endplate. 3 mm L4-L5 grade 1 anterolisthesis. 3 mm L5-S1 grade 1 retrolisthesis. Vertebrae: Redemonstrated chronic T12 vertebral compression fracture (40-50% height loss) post vertebral augmentation. Acute L1 superior endplate vertebral compression fracture (approximately 20% height loss). Redemonstrated subacute on chronic L2 inferior endplate compression fracture. Approximately 30% height loss at this level, unchanged from the  prior CT abdomen/pelvis of 04/22/2022. 4 mm bony retropulsion at the level of the L2 inferior endplate, also unchanged. Marrow edema within the L1 vertebral body, greatest along the superior endplate. Marrow edema within the L2 vertebral body, greatest along the inferior endplate. Mild superior endplate edema also present at L3. There is an unchanged small Schmorl node within the right aspect of the L3 superior endplate without convincing superior endplate compression deformity. Marrow edema within the posterior elements at L3-L4, likely degenerative and related to facet arthrosis. Redemonstrated large L4 vertebral body hemangioma. Conus medullaris and cauda equina: Conus extends to the L2 level. No signal abnormality within the visualized distal spinal cord. Paraspinal and other soft tissues: Bilateral renal cysts. No paraspinal mass or collection. Disc levels: Unless otherwise stated, the level by level findings below have not significantly changed from the prior MRI of 12/20/2021. Multilevel disc degeneration, greatest at L1-L2 (moderate), L2-L3 (moderate) and L5-S1 (advanced). T11-T12: This level is imaged in the sagittal plane only. Disc bulge. Mild facet arthrosis. The disc bulge mildly effaces the ventral thecal sac and contacts the ventral aspect of the spinal cord. No significant foraminal stenosis. T12-L1: Disc bulge. Facet arthrosis (mild right, moderate left). Ligamentum flavum hypertrophy on the left. No significant spinal canal or foraminal stenosis. L1-L2: 2 mm grade 1 retrolisthesis. Disc bulge. Mild-to-moderate facet arthrosis. Mild to moderate bilateral subarticular narrowing (  without appreciable nerve root impingement). Mild narrowing of the central canal. Mild neural foraminal narrowing on the right. L2-L3: 4 mm bony retropulsion at the level of the L4 inferior endplate, new from the prior MRI of 12/20/2021. Progressive disc bulge. Progressive advanced facet arthrosis with ligamentum flavum  hypertrophy. Progressive severe central canal stenosis with bilateral subarticular narrowing. Bilateral neural foraminal narrowing (moderate/severe, mild/moderate left), progressed. L3-L4: Disc bulge. Moderate facet arthrosis with ligamentum flavum hypertrophy. Mild right subarticular narrowing (without appreciable nerve root impingement). Mild relative narrowing of the central canal. Mild right neural foraminal narrowing. L4-L5: 3 mm grade 1 anterolisthesis. Disc uncovering with disc bulge. Advanced facet arthrosis with ligamentum flavum hypertrophy. Bilateral subarticular narrowing (mild-to-moderate right, mild left) with medialization of the descending right L5 nerve root. Moderate central canal stenosis. Minimal relative left neural foraminal narrowing. L5-S1: 3 mm grade 1 retrolisthesis. Disc bulge with endplate osteophytes. Right center/subarticular posterior annular fissure. Facet arthrosis (mild right, moderate left) with ligamentum flavum hypertrophy. Right subarticular stenosis with impingement of the descending right S1 nerve root. Mild left subarticular narrowing (without appreciable nerve root impingement at this site). No significant central canal stenosis. Bilateral neural foraminal narrowing (mild right, moderate left). IMPRESSION: Acute L1 superior endplate vertebral compression fracture (approximately 20% height loss). No significant bony retropulsion at this level. Subacute on chronic L2 inferior endplate vertebral compression fracture. Approximately 30% height loss at this level, unchanged from the recent prior CT abdomen/pelvis of 04/22/2022. At L2-L3, 4 mm bony retropulsion at the level of the L2 inferior endplate and superimposed spondylosis contribute to progressive severe central canal stenosis with bilateral subarticular narrowing. Bilateral neural foraminal narrowing (moderate/severe right, mild-to-moderate left), also progressed. Mild marrow edema along the L3 superior endplate. Unchanged  small Schmorl node within the right aspect of the L3 superior endplate, but no convincing L3 superior endplate compression fracture. Unchanged chronic T12 vertebral compression fracture status post vertebral augmentation. Additional lumbar spondylosis, as outlined and otherwise unchanged from the prior MRI of 12/20/2021. At these remaining levels, spondylosis is greatest at L1-L2, L4-L5 and L5-S1. Electronically Signed   By: Kellie Simmering D.O.   On: 04/25/2022 11:01   DG INJECT DIAG/THERA/INC NEEDLE/CATH/PLC EPI/LUMB/SAC W/IMG  Result Date: 04/06/2022 CLINICAL DATA:  Lumbosacral spondylosis without myelopathy. Moderate spinal stenosis at L4-5. Sacrococcygeal and right leg pain. The patient also has a subacute L2 compression fracture, however she denies significant upper lumbar back pain. FLUOROSCOPY: Radiation Exposure Index (as provided by the fluoroscopic device): 4.50 mGy Kerma PROCEDURE: The procedure, risks, benefits, and alternatives were explained to the patient. Questions regarding the procedure were encouraged and answered. The patient understands and consents to the procedure. LUMBAR EPIDURAL INJECTION: An interlaminar approach was performed on the right at L5-S1. The overlying skin was cleansed and anesthetized. A 3.5 inch 20 gauge epidural needle was advanced using loss-of-resistance technique. DIAGNOSTIC EPIDURAL INJECTION: Injection of Isovue-M 200 shows a good epidural pattern with spread above and below the level of needle placement, primarily on the right. No vascular opacification is seen. THERAPEUTIC EPIDURAL INJECTION: 80 mg of Depo-Medrol mixed with 2 mL of 1% lidocaine were instilled. The injection reproduced the patient's sacrococcygeal and right leg pain. The procedure was well-tolerated, and the patient was discharged thirty minutes following the injection in good condition. COMPLICATIONS: None immediate IMPRESSION: Technically successful interlaminar epidural injection on the right at  L5-S1. Electronically Signed   By: Logan Bores M.D.   On: 04/06/2022 13:29   CT Renal Stone Study  Result Date: 03/30/2022 CLINICAL  DATA:  Flank pain with kidney stone suspected. Constipation for 3 days EXAM: CT ABDOMEN AND PELVIS WITHOUT CONTRAST TECHNIQUE: Multidetector CT imaging of the abdomen and pelvis was performed following the standard protocol without IV contrast. RADIATION DOSE REDUCTION: This exam was performed according to the departmental dose-optimization program which includes automated exposure control, adjustment of the mA and/or kV according to patient size and/or use of iterative reconstruction technique. COMPARISON:  01/27/2022 FINDINGS: Lower chest:  Small sliding hiatal hernia Hepatobiliary: Prominent liver fissures and caudate lobe size but no definite surface lobulation for cirrhosis.No evidence of biliary obstruction or stone. Pancreas: Generalized atrophy Spleen: Unremarkable. Adrenals/Urinary Tract: Negative adrenals. No hydronephrosis or stone. Unremarkable bladder. Stomach/Bowel: No obstruction. Left colonic diverticulosis. No visible bowel inflammation. Vascular/Lymphatic: Atheromatous calcification of the aorta and iliacs. No mass or adenopathy. Reproductive:No pathologic findings. 13 mm simple appearing right ovarian cyst. No follow-up imaging recommended. Note: This recommendation does not apply to premenarchal patients and to those with increased risk (genetic, family history, elevated tumor markers or other high-risk factors) of ovarian cancer. Reference: JACR 2020 Feb; 17(2):248-254 Other: No ascites or pneumoperitoneum. Homogeneous nodule along the posterior aspect of the right diaphragm, benign given multi year stability Musculoskeletal: L2 compression fracture with inferior endplate depression and sclerosis. Fracture lines are still readily apparent and there is mild paravertebral edema. Some callus is likely present and there is vacuum phenomenon in the adjacent disc  space. Remote T12 compression fracture with cement augmentation. Generalized osteopenia and lumbar spine degeneration with mild L4-5 anterolisthesis. Sizable hemangioma at the L4 body. IMPRESSION: 1. Subacute/un-healed L2 compression fracture with mild height loss. 2. No acute intra-abdominal finding. 3. Atherosclerosis and colonic diverticulosis. Electronically Signed   By: Jorje Guild M.D.   On: 03/30/2022 05:27    Kathie Dike, MD  [facility]  If 7PM-7AM, please contact night-coverage [email or web address]  04/28/2022, 8:26 PM   LOS: 2 days

## 2022-04-28 NOTE — Progress Notes (Signed)
Discharge order are currently in. Per facility the discharge paper work had arrived too late and they will be able to admit pt tomorrow.

## 2022-04-28 NOTE — Progress Notes (Signed)
Patient alert and oriented to person and place. Depends on staff to anticipate needs. BM '@0600'$  black-green seeded and bulky. Mepilex to sacral area. LR '@100ml'$ /hr via 22G IV @ RFA. IV flushed and patent. IV dressing clean, dry, and intact. Patient up majority of shift. In semi-fowler's with eyes open. No acute distress at this time.

## 2022-04-28 NOTE — Telephone Encounter (Signed)
Patient needs hospital follow-up in 2 to 3 weeks regarding constipation/obstipation, NASH cirrhosis

## 2022-04-28 NOTE — Progress Notes (Addendum)
   04/28/22 1829  Vitals  BP (!) 153/83  MAP (mmHg) 100  BP Location Left Arm  BP Method Automatic  Pulse Rate (!) 126  Pulse Rate Source Monitor  Resp 17  Level of Consciousness  Level of Consciousness Alert  MEWS COLOR  MEWS Score Color Yellow  Oxygen Therapy  SpO2 95 %  O2 Device Room Air  Pain Assessment  Pain Scale 0-10  Pain Score 0  MEWS Score  MEWS Temp 0  MEWS Systolic 0  MEWS Pulse 2  MEWS RR 0  MEWS LOC 0  MEWS Score 2  Provider Notification  Provider Name/Title Memmon  Date Provider Notified 04/28/22  Time Provider Notified [id number]  Method of Notification Page  Notification Reason Other (Comment) (Yellow MEWS)  Provider response See new orders  Date of Provider Response 04/28/22  Time of Provider Response 1829    CCMD called this nurse and stated that the  pt was sustaining 130-135. Vitals obtained and MD notified and new orders placed.

## 2022-04-28 NOTE — Care Management Important Message (Signed)
Important Message  Patient Details  Name: Heather [name] MRN: [id number] Date of Birth: 10-Aug-1931   Medicare Important Message Given:  N/A - LOS <3 / Initial given by admissions     Tommy Medal 04/28/2022, 11:28 AM

## 2022-04-28 NOTE — TOC Transition Note (Addendum)
Transition of Care [facility]) - CM/SW Discharge Note   Patient Details  Name: Heather [name] MRN: [id number] Date of Birth: 1931-01-04  [facility] CM/SW Contact:  Boneta Lucks, RN Phone Number: 04/28/2022, 11:26 AM   Clinical Narrative:   Patient discharging to [facility], insurance [place] received [facility] # [phone] and EMS # [id number]. TOC updated her daugher. RN to call report, patient going to room 133-1 [place], med necessity printed. TOC will call EMS when RN is ready.   AddendumBeatrice Lecher has cut off of 3:30 for DC summary. They just called to say they will admit tomorrow.  EMS has no trucks until 8 PM and 3 on the list. They will not accept her that late, Team updated for AM discharge. Addendum #2 Patient discharge to [facility] today, DC summary sent, new med necessity printed.  Final next level of care: Skilled Nursing Facility Barriers to Discharge: Barriers Resolved   Patient Goals and CMS Choice Patient states their goals for this hospitalization and ongoing recovery are:: agreeable to SNF CMS Medicare.gov Compare Post Acute Care list provided to:: Patient Represenative (must comment) Choice offered to / list presented to : Adult Children  Discharge Placement            Name of family member notified: Daughter- [name] Patient and family notified of of transfer: 04/28/22  Discharge Plan and Services In-house Referral: Clinical Social Work   Post Acute Care Choice: [place]              Readmission Risk Interventions    04/28/2022   11:17 AM  Readmission Risk Prevention Plan  Transportation Screening Complete  PCP or Specialist Appt within 5-7 Days Complete  Home Care Screening Complete  Medication Review (RN CM) Complete

## 2022-04-28 NOTE — Progress Notes (Signed)
Gastroenterology Progress Note   Referring Provider: No ref. provider found Primary Care Physician:  Glenda Chroman, MD Primary Gastroenterologist:  Dr. Jenetta Downer  Patient ID: Heather [name]; [id number]; 04-02-1931   Subjective:    Patient sitting up in the chair.  Feels better this morning.  Tolerating diet.  No nausea or vomiting.  Feels like her stomach is less distended.  Has had 2 bowel movements this morning.  Nursing staff reports stool dark tarry.  Also reported to have 1 episode of rectal bleeding yesterday.  Currently on lactulose 20 g twice daily, Senokot-S twice daily, MiraLAX 17 g daily.  Received smog enema July 4  Objective:   Vital signs in last 24 hours: Temp:  [97.6 F (36.4 C)-97.9 F (36.6 C)] 97.6 F (36.4 C) (07/06 0526) Pulse Rate:  [75-82] 79 (07/06 0526) Resp:  [16] 16 (07/06 0526) BP: (136-172)/(58-64) 172/62 (07/06 0526) SpO2:  [95 %-97 %] 95 % (07/06 0526) Last BM Date : 04/27/22 General:   Alert,  Well-developed, well-nourished, pleasant and cooperative in NAD Head:  Normocephalic and atraumatic. Eyes:  Sclera clear, no icterus.   Abdomen:  Soft, nontender.  Mild distention. Normal bowel sounds, without guarding, and without rebound.   Extremities:  Without clubbing, deformity or edema. Neurologic:  Alert and  oriented x4;  grossly normal neurologically. Skin:  Intact without significant lesions or rashes. Psych:  Alert and cooperative. Normal mood and affect.  Intake/Output from previous day: 07/05 0701 - 07/06 0700 In: 483 [P.O.:480; I.V.:3] Out: 750 [Urine:750] Intake/Output this shift: No intake/output data recorded.  Lab Results: CBC Recent Labs    04/26/22 0445 04/27/22 0440 04/28/22 0405  WBC 12.0* 8.3 8.7  HGB 15.1* 12.9 13.5  HCT 45.6 40.1 40.4  MCV 89.4 91.1 90.0  PLT 194 145* 143*   BMET Recent Labs    04/26/22 0445 04/27/22 0440 04/28/22 0405  NA 136 139 138  K 3.9 3.6 3.7  CL 99 105 108  CO2 '27 27 24   '$ GLUCOSE 129* 80 95  BUN '19 12 13  '$ CREATININE 0.97 0.74 0.65  CALCIUM 8.9 8.3* 8.7*   LFTs No results for input(s): "BILITOT", "BILIDIR", "IBILI", "ALKPHOS", "AST", "ALT", "PROT", "ALBUMIN" in the last 72 hours. No results for input(s): "LIPASE" in the last 72 hours. PT/INR No results for input(s): "LABPROT", "INR" in the last 72 hours.      Imaging Studies: ECHOCARDIOGRAM COMPLETE  Result Date: 04/26/2022    ECHOCARDIOGRAM REPORT   Patient Name:   NASTASHA REISING Date of Exam: 04/26/2022 Medical Rec #:  [id number]          Height:       67.0 in Accession #:    [id number]         Weight:       171.7 lb Date of Birth:  1931-09-21          BSA:          1.895 m Patient Age:    21 years           BP:           150/69 mmHg Patient Gender: F                  HR:           80 bpm. Exam Location:  [facility] Procedure: 2D Echo Indications:    chest pain.  History:        Patient has prior history of  Echocardiogram examinations, most                 recent 12/27/2019. Risk Factors:Hypertension.  Sonographer:    Johny Chess RDCS Referring Phys: [phone] DAVID TAT IMPRESSIONS  1. Left ventricular ejection fraction, by estimation, is 65 to 70%. The left ventricle has normal function. The left ventricle has no regional wall motion abnormalities. There is mild left ventricular hypertrophy. Left ventricular diastolic parameters are consistent with Grade I diastolic dysfunction (impaired relaxation).  2. Right ventricular systolic function is normal. The right ventricular size is normal.  3. The mitral valve is normal in structure. No evidence of mitral valve regurgitation. No evidence of mitral stenosis.  4. The aortic valve is normal in structure. Aortic valve regurgitation is not visualized. No aortic stenosis is present.  5. The inferior vena cava is normal in size with greater than 50% respiratory variability, suggesting right atrial pressure of 3 mmHg. Comparison(s): No significant change from prior study.  Prior images reviewed side by side. FINDINGS  Left Ventricle: Left ventricular ejection fraction, by estimation, is 65 to 70%. The left ventricle has normal function. The left ventricle has no regional wall motion abnormalities. The left ventricular internal cavity size was normal in size. There is  mild left ventricular hypertrophy. Left ventricular diastolic parameters are consistent with Grade I diastolic dysfunction (impaired relaxation). Right Ventricle: The right ventricular size is normal. No increase in right ventricular wall thickness. Right ventricular systolic function is normal. Left Atrium: Left atrial size was normal in size. Right Atrium: Right atrial size was normal in size. Pericardium: There is no evidence of pericardial effusion. Mitral Valve: The mitral valve is normal in structure. No evidence of mitral valve regurgitation. No evidence of mitral valve stenosis. Tricuspid Valve: The tricuspid valve is normal in structure. Tricuspid valve regurgitation is not demonstrated. No evidence of tricuspid stenosis. Aortic Valve: The aortic valve is normal in structure. Aortic valve regurgitation is not visualized. No aortic stenosis is present. Pulmonic Valve: The pulmonic valve was normal in structure. Pulmonic valve regurgitation is trivial. No evidence of pulmonic stenosis. Aorta: The aortic root is normal in size and structure. Venous: The inferior vena cava is normal in size with greater than 50% respiratory variability, suggesting right atrial pressure of 3 mmHg. IAS/Shunts: No atrial level shunt detected by color flow Doppler.  LEFT VENTRICLE PLAX 2D LVIDd:         4.40 cm   Diastology LVIDs:         3.30 cm   LV e' medial:    6.09 cm/s LV PW:         1.30 cm   LV E/e' medial:  8.5 LV IVS:        0.90 cm   LV e' lateral:   9.90 cm/s LVOT diam:     2.00 cm   LV E/e' lateral: 5.3 LV SV:         53 LV SV Index:   28 LVOT Area:     3.14 cm  RIGHT VENTRICLE             IVC RV S prime:     23.90 cm/s   IVC diam: 1.90 cm TAPSE (M-mode): 1.6 cm LEFT ATRIUM             Index        RIGHT ATRIUM           Index LA diam:        4.00  cm 2.11 cm/m   RA Area:     14.60 cm LA Vol (A2C):   31.6 ml 16.67 ml/m  RA Volume:   37.10 ml  19.57 ml/m LA Vol (A4C):   33.5 ml 17.67 ml/m LA Biplane Vol: 33.4 ml 17.62 ml/m  AORTIC VALVE             PULMONIC VALVE LVOT Vmax:   94.10 cm/s  PR End Diast Vel: 7.18 msec LVOT Vmean:  65.100 cm/s LVOT VTI:    0.169 m  AORTA Ao Root diam: 3.10 cm Ao Asc diam:  3.20 cm MV E velocity: 52.00 cm/s MV A velocity: 123.00 cm/s  SHUNTS MV E/A ratio:  0.42         Systemic VTI:  0.17 m                             Systemic Diam: 2.00 cm Candee Furbish MD Electronically signed by Candee Furbish MD Signature Date/Time: 04/26/2022/2:53:41 PM    Final    DG Abd 2 Views  Result Date: 04/26/2022 CLINICAL DATA:  Abdominal distension and pain. History of diabetes and hypertension. EXAM: ABDOMEN - 2 VIEW COMPARISON:  Plain film of the abdomen dated 11/16/2020 FINDINGS: No dilated large or small bowel loops are identified. No radiographic evidence of constipation. No evidence of soft tissue mass or abnormal fluid collection. No evidence of free intraperitoneal air. No evidence of renal or ureteral calculi. IMPRESSION: No acute findings. Nonobstructive bowel gas pattern. Electronically Signed   By: Franki Cabot M.D.   On: 04/26/2022 13:44   CT Angio Chest PE W and/or Wo Contrast  Result Date: 04/25/2022 CLINICAL DATA:  Pulmonary embolism (PE) suspected, positive D-dimer; Abdominal pain, acute, nonlocalized EXAM: CT ANGIOGRAPHY CHEST CT ABDOMEN AND PELVIS WITH CONTRAST TECHNIQUE: Multidetector CT imaging of the chest was performed using the standard protocol during bolus administration of intravenous contrast. Multiplanar CT image reconstructions and MIPs were obtained to evaluate the vascular anatomy. Multidetector CT imaging of the abdomen and pelvis was performed using the standard protocol during bolus  administration of intravenous contrast. RADIATION DOSE REDUCTION: This exam was performed according to the departmental dose-optimization program which includes automated exposure control, adjustment of the mA and/or kV according to patient size and/or use of iterative reconstruction technique. CONTRAST:  125m OMNIPAQUE IOHEXOL 350 MG/ML SOLN COMPARISON:  CT abdomen pelvis 04/22/2022. FINDINGS: CTA CHEST FINDINGS Cardiovascular: Satisfactory opacification of the pulmonary arteries to the segmental level. No evidence of pulmonary embolism. Mild cardiomegaly.No pericardial disease. Coronary artery atherosclerosis. Moderate atherosclerosis of thoracic aorta. Mediastinum/Nodes: No lymphadenopathy. There is a 1.6 cm heterogeneous left thyroid nodule. Small hiatal hernia. Lungs/Pleura: Bibasilar hypoventilatory changes and atelectasis. No focal airspace consolidation. There is a 4 mm ground-glass nodule in the left upper lobe (series 5, image 58). No follow-up imaging is recommended (SYSCOSociety 2017; Radiology 2017; [id number]. Musculoskeletal: There is a chronic T12 compression deformity with prior vertebroplasty. Review of the MIP images confirms the above findings. CT ABDOMEN and PELVIS FINDINGS Hepatobiliary: No focal liver abnormality is seen. The gallbladder is unremarkable. Pancreas: Unremarkable. No pancreatic ductal dilatation or surrounding inflammatory changes. Spleen: Normal in size without focal abnormality. Adrenals/Urinary Tract: Adrenal glands are unremarkable. No hydronephrosis or nephrolithiasis. There are tiny bilateral renal cysts. The bladder is moderately distended. Stomach/Bowel: Small hiatal hernia. The stomach is otherwise within normal limits. There is no evidence of bowel obstruction.The appendix is normal. There is marked distension  of the cecum and to a lesser degree the a hepatic flexure and transverse colon with air-fluid and fat fluid levels. There is no adjacent  stranding.Colonic diverticulosis. No diverticulitis. Vascular/Lymphatic: Aortoiliac atherosclerosis. No AAA. No lymphadenopathy. Reproductive: Unremarkable. Other: Diastasis recti. Bilateral fat containing inguinal hernias. No bowel containing hernia. No ascites. No free air. Musculoskeletal: No acute osseous abnormality. No suspicious osseous lesion. Unchanged compression fracture of L2. No progressive height loss. Unchanged L4 vertebral body hemangioma. There is a new superior endplate compression fracture of L1 with approximately 15% height loss. Unchanged multilevel degenerative disc disease most severe at L5-S1 with trace retrolisthesis. Unchanged grade 1 anterolisthesis at L4-L5. Mild bilateral hip osteoarthritis. Review of the MIP images confirms the above findings. IMPRESSION: CT CHEST: No evidence of pulmonary embolism.  No focal airspace disease. 1.6 cm left thyroid nodule. If not previously performed, recommend non-emergent thyroid ultrasound as an outpatient. Small hiatal hernia. CT ABDOMEN AND PELVIS: Distension of the cecum and to a lesser degree of the hepatic flexure and transverse colon with air-fluid and fat-fluid levels. No wall thickening or adjacent stranding to suggest colitis, however this could potentially be related the patient's pain/discomfort. Diverticulosis without diverticulitis. Normal appendix. No bowel obstruction. New superior endplate compression fracture of L1 with approximately 15 percent height loss. Unchanged T12 and L2 compression deformities. Electronically Signed   By: Maurine Simmering M.D.   On: 04/25/2022 12:01   CT ABDOMEN PELVIS W CONTRAST  Result Date: 04/25/2022 CLINICAL DATA:  Pulmonary embolism (PE) suspected, positive D-dimer; Abdominal pain, acute, nonlocalized EXAM: CT ANGIOGRAPHY CHEST CT ABDOMEN AND PELVIS WITH CONTRAST TECHNIQUE: Multidetector CT imaging of the chest was performed using the standard protocol during bolus administration of intravenous contrast.  Multiplanar CT image reconstructions and MIPs were obtained to evaluate the vascular anatomy. Multidetector CT imaging of the abdomen and pelvis was performed using the standard protocol during bolus administration of intravenous contrast. RADIATION DOSE REDUCTION: This exam was performed according to the departmental dose-optimization program which includes automated exposure control, adjustment of the mA and/or kV according to patient size and/or use of iterative reconstruction technique. CONTRAST:  153m OMNIPAQUE IOHEXOL 350 MG/ML SOLN COMPARISON:  CT abdomen pelvis 04/22/2022. FINDINGS: CTA CHEST FINDINGS Cardiovascular: Satisfactory opacification of the pulmonary arteries to the segmental level. No evidence of pulmonary embolism. Mild cardiomegaly.No pericardial disease. Coronary artery atherosclerosis. Moderate atherosclerosis of thoracic aorta. Mediastinum/Nodes: No lymphadenopathy. There is a 1.6 cm heterogeneous left thyroid nodule. Small hiatal hernia. Lungs/Pleura: Bibasilar hypoventilatory changes and atelectasis. No focal airspace consolidation. There is a 4 mm ground-glass nodule in the left upper lobe (series 5, image 58). No follow-up imaging is recommended (SYSC[employer]ociety 2017; Radiology 2017; [phone]. Musculoskeletal: There is a chronic T12 compression deformity with prior vertebroplasty. Review of the MIP images confirms the above findings. CT ABDOMEN and PELVIS FINDINGS Hepatobiliary: No focal liver abnormality is seen. The gallbladder is unremarkable. Pancreas: Unremarkable. No pancreatic ductal dilatation or surrounding inflammatory changes. Spleen: Normal in size without focal abnormality. Adrenals/Urinary Tract: Adrenal glands are unremarkable. No hydronephrosis or nephrolithiasis. There are tiny bilateral renal cysts. The bladder is moderately distended. Stomach/Bowel: Small hiatal hernia. The stomach is otherwise within normal limits. There is no evidence of bowel obstruction.The  appendix is normal. There is marked distension of the cecum and to a lesser degree the a hepatic flexure and transverse colon with air-fluid and fat fluid levels. There is no adjacent stranding.Colonic diverticulosis. No diverticulitis. Vascular/Lymphatic: Aortoiliac atherosclerosis. No AAA. No lymphadenopathy. Reproductive: Unremarkable. Other: Diastasis  recti. Bilateral fat containing inguinal hernias. No bowel containing hernia. No ascites. No free air. Musculoskeletal: No acute osseous abnormality. No suspicious osseous lesion. Unchanged compression fracture of L2. No progressive height loss. Unchanged L4 vertebral body hemangioma. There is a new superior endplate compression fracture of L1 with approximately 15% height loss. Unchanged multilevel degenerative disc disease most severe at L5-S1 with trace retrolisthesis. Unchanged grade 1 anterolisthesis at L4-L5. Mild bilateral hip osteoarthritis. Review of the MIP images confirms the above findings. IMPRESSION: CT CHEST: No evidence of pulmonary embolism.  No focal airspace disease. 1.6 cm left thyroid nodule. If not previously performed, recommend non-emergent thyroid ultrasound as an outpatient. Small hiatal hernia. CT ABDOMEN AND PELVIS: Distension of the cecum and to a lesser degree of the hepatic flexure and transverse colon with air-fluid and fat-fluid levels. No wall thickening or adjacent stranding to suggest colitis, however this could potentially be related the patient's pain/discomfort. Diverticulosis without diverticulitis. Normal appendix. No bowel obstruction. New superior endplate compression fracture of L1 with approximately 15 percent height loss. Unchanged T12 and L2 compression deformities. Electronically Signed   By: Maurine Simmering M.D.   On: 04/25/2022 12:01   MR Lumbar Spine W Wo Contrast  Result Date: 04/25/2022 CLINICAL DATA:  Provided history: Low back pain, cauda equina syndrome suspected. EXAM: MRI LUMBAR SPINE WITHOUT AND WITH  CONTRAST TECHNIQUE: Multiplanar and multiecho pulse sequences of the lumbar spine were obtained without and with intravenous contrast. CONTRAST:  48m GADAVIST GADOBUTROL 1 MMOL/ML IV SOLN COMPARISON:  CT abdomen/pelvis 04/22/2022. CT abdomen/pelvis 03/31/2019. Lumbar spine MRI 12/20/2021. FINDINGS: Segmentation: 5 lumbar vertebrae. The caudal most well-formed intervertebral disc space is designated L5-S1. Alignment: 2 mm grade 1 retrolisthesis at L1-L2. Unchanged 4 mm bony retropulsion at the level of the L4 inferior endplate. 3 mm L4-L5 grade 1 anterolisthesis. 3 mm L5-S1 grade 1 retrolisthesis. Vertebrae: Redemonstrated chronic T12 vertebral compression fracture (40-50% height loss) post vertebral augmentation. Acute L1 superior endplate vertebral compression fracture (approximately 20% height loss). Redemonstrated subacute on chronic L2 inferior endplate compression fracture. Approximately 30% height loss at this level, unchanged from the prior CT abdomen/pelvis of 04/22/2022. 4 mm bony retropulsion at the level of the L2 inferior endplate, also unchanged. Marrow edema within the L1 vertebral body, greatest along the superior endplate. Marrow edema within the L2 vertebral body, greatest along the inferior endplate. Mild superior endplate edema also present at L3. There is an unchanged small Schmorl node within the right aspect of the L3 superior endplate without convincing superior endplate compression deformity. Marrow edema within the posterior elements at L3-L4, likely degenerative and related to facet arthrosis. Redemonstrated large L4 vertebral body hemangioma. Conus medullaris and cauda equina: Conus extends to the L2 level. No signal abnormality within the visualized distal spinal cord. Paraspinal and other soft tissues: Bilateral renal cysts. No paraspinal mass or collection. Disc levels: Unless otherwise stated, the level by level findings below have not significantly changed from the prior MRI of  12/20/2021. Multilevel disc degeneration, greatest at L1-L2 (moderate), L2-L3 (moderate) and L5-S1 (advanced). T11-T12: This level is imaged in the sagittal plane only. Disc bulge. Mild facet arthrosis. The disc bulge mildly effaces the ventral thecal sac and contacts the ventral aspect of the spinal cord. No significant foraminal stenosis. T12-L1: Disc bulge. Facet arthrosis (mild right, moderate left). Ligamentum flavum hypertrophy on the left. No significant spinal canal or foraminal stenosis. L1-L2: 2 mm grade 1 retrolisthesis. Disc bulge. Mild-to-moderate facet arthrosis. Mild to moderate bilateral subarticular narrowing (without  appreciable nerve root impingement). Mild narrowing of the central canal. Mild neural foraminal narrowing on the right. L2-L3: 4 mm bony retropulsion at the level of the L4 inferior endplate, new from the prior MRI of 12/20/2021. Progressive disc bulge. Progressive advanced facet arthrosis with ligamentum flavum hypertrophy. Progressive severe central canal stenosis with bilateral subarticular narrowing. Bilateral neural foraminal narrowing (moderate/severe, mild/moderate left), progressed. L3-L4: Disc bulge. Moderate facet arthrosis with ligamentum flavum hypertrophy. Mild right subarticular narrowing (without appreciable nerve root impingement). Mild relative narrowing of the central canal. Mild right neural foraminal narrowing. L4-L5: 3 mm grade 1 anterolisthesis. Disc uncovering with disc bulge. Advanced facet arthrosis with ligamentum flavum hypertrophy. Bilateral subarticular narrowing (mild-to-moderate right, mild left) with medialization of the descending right L5 nerve root. Moderate central canal stenosis. Minimal relative left neural foraminal narrowing. L5-S1: 3 mm grade 1 retrolisthesis. Disc bulge with endplate osteophytes. Right center/subarticular posterior annular fissure. Facet arthrosis (mild right, moderate left) with ligamentum flavum hypertrophy. Right  subarticular stenosis with impingement of the descending right S1 nerve root. Mild left subarticular narrowing (without appreciable nerve root impingement at this site). No significant central canal stenosis. Bilateral neural foraminal narrowing (mild right, moderate left). IMPRESSION: Acute L1 superior endplate vertebral compression fracture (approximately 20% height loss). No significant bony retropulsion at this level. Subacute on chronic L2 inferior endplate vertebral compression fracture. Approximately 30% height loss at this level, unchanged from the recent prior CT abdomen/pelvis of 04/22/2022. At L2-L3, 4 mm bony retropulsion at the level of the L2 inferior endplate and superimposed spondylosis contribute to progressive severe central canal stenosis with bilateral subarticular narrowing. Bilateral neural foraminal narrowing (moderate/severe right, mild-to-moderate left), also progressed. Mild marrow edema along the L3 superior endplate. Unchanged small Schmorl node within the right aspect of the L3 superior endplate, but no convincing L3 superior endplate compression fracture. Unchanged chronic T12 vertebral compression fracture status post vertebral augmentation. Additional lumbar spondylosis, as outlined and otherwise unchanged from the prior MRI of 12/20/2021. At these remaining levels, spondylosis is greatest at L1-L2, L4-L5 and L5-S1. Electronically Signed   By: Kellie Simmering D.O.   On: 04/25/2022 11:01   DG INJECT DIAG/THERA/INC NEEDLE/CATH/PLC EPI/LUMB/SAC W/IMG  Result Date: 04/06/2022 CLINICAL DATA:  Lumbosacral spondylosis without myelopathy. Moderate spinal stenosis at L4-5. Sacrococcygeal and right leg pain. The patient also has a subacute L2 compression fracture, however she denies significant upper lumbar back pain. FLUOROSCOPY: Radiation Exposure Index (as provided by the fluoroscopic device): 4.50 mGy Kerma PROCEDURE: The procedure, risks, benefits, and alternatives were explained to the  patient. Questions regarding the procedure were encouraged and answered. The patient understands and consents to the procedure. LUMBAR EPIDURAL INJECTION: An interlaminar approach was performed on the right at L5-S1. The overlying skin was cleansed and anesthetized. A 3.5 inch 20 gauge epidural needle was advanced using loss-of-resistance technique. DIAGNOSTIC EPIDURAL INJECTION: Injection of Isovue-M 200 shows a good epidural pattern with spread above and below the level of needle placement, primarily on the right. No vascular opacification is seen. THERAPEUTIC EPIDURAL INJECTION: 80 mg of Depo-Medrol mixed with 2 mL of 1% lidocaine were instilled. The injection reproduced the patient's sacrococcygeal and right leg pain. The procedure was well-tolerated, and the patient was discharged thirty minutes following the injection in good condition. COMPLICATIONS: None immediate IMPRESSION: Technically successful interlaminar epidural injection on the right at L5-S1. Electronically Signed   By: Logan Bores M.D.   On: 04/06/2022 13:29   CT Renal Stone Study  Result Date: 03/30/2022 CLINICAL DATA:  Flank pain with kidney stone suspected. Constipation for 3 days EXAM: CT ABDOMEN AND PELVIS WITHOUT CONTRAST TECHNIQUE: Multidetector CT imaging of the abdomen and pelvis was performed following the standard protocol without IV contrast. RADIATION DOSE REDUCTION: This exam was performed according to the departmental dose-optimization program which includes automated exposure control, adjustment of the mA and/or kV according to patient size and/or use of iterative reconstruction technique. COMPARISON:  01/27/2022 FINDINGS: Lower chest:  Small sliding hiatal hernia Hepatobiliary: Prominent liver fissures and caudate lobe size but no definite surface lobulation for cirrhosis.No evidence of biliary obstruction or stone. Pancreas: Generalized atrophy Spleen: Unremarkable. Adrenals/Urinary Tract: Negative adrenals. No hydronephrosis  or stone. Unremarkable bladder. Stomach/Bowel: No obstruction. Left colonic diverticulosis. No visible bowel inflammation. Vascular/Lymphatic: Atheromatous calcification of the aorta and iliacs. No mass or adenopathy. Reproductive:No pathologic findings. 13 mm simple appearing right ovarian cyst. No follow-up imaging recommended. Note: This recommendation does not apply to premenarchal patients and to those with increased risk (genetic, family history, elevated tumor markers or other high-risk factors) of ovarian cancer. Reference: JACR 2020 Feb; 17(2):248-254 Other: No ascites or pneumoperitoneum. Homogeneous nodule along the posterior aspect of the right diaphragm, benign given multi year stability Musculoskeletal: L2 compression fracture with inferior endplate depression and sclerosis. Fracture lines are still readily apparent and there is mild paravertebral edema. Some callus is likely present and there is vacuum phenomenon in the adjacent disc space. Remote T12 compression fracture with cement augmentation. Generalized osteopenia and lumbar spine degeneration with mild L4-5 anterolisthesis. Sizable hemangioma at the L4 body. IMPRESSION: 1. Subacute/un-healed L2 compression fracture with mild height loss. 2. No acute intra-abdominal finding. 3. Atherosclerosis and colonic diverticulosis. Electronically Signed   By: Jorje Guild M.D.   On: 03/30/2022 05:27  [2 weeks]  Assessment:   88 y/o female with IBS-C, HTN, NASH cirrhosis, presented to ED this admission with abdominal distention, periumbilical pain, constipation for 3-4 days.   CTA chest/A/P on 7/3 with distension of the cecum (7 cm) and to a lesser degree of the hepatic flexure and transverse colon with air-fluid and fat-fluid levels. No wall thickening. Imaging concerning for ileus vs possible developing pseudoobstruction.  New L1 compression fracture with chronic T12 and L2 compression fracture, unchanged.  KUB July 4 with no acute findings,  nonobstructive bowel gas pattern.  Patient continues to pass stool over the past couple days.  Nursing staff report single episode of hematochezia yesterday.  Stools are dark/green green in appearance today.  Hemoglobin remains normal at 13.5.  Doubt true melena.  Electrolytes normal. Abdominal exam improved.    Last colonoscopy October 2013 with moderate diverticulosis in the sigmoid colon, 2 tubular adenomas removed as well sigmoid colon.  Plan:   Recommend patient going home on bowel regimen.  Would continue lactulose 20 g (30 cc) 2-3 times daily to have at least 2 soft stools per day.  Continue MiraLAX 17 mg daily.  Will need short interval follow-up with GI to adjust regimen as needed. Follow-up with Dr. Jenetta Downer as an outpatient regarding constipation, NASH cirrhosis.   LOS: 2 days   Laureen Ochs. Bernarda Caffey [facility] [phone] 7/6/20239:57 AM

## 2022-04-29 DIAGNOSIS — E1165 Type 2 diabetes mellitus with hyperglycemia: Secondary | ICD-10-CM | POA: Diagnosis not present

## 2022-04-29 DIAGNOSIS — M6281 Muscle weakness (generalized): Secondary | ICD-10-CM | POA: Diagnosis not present

## 2022-04-29 DIAGNOSIS — K7460 Unspecified cirrhosis of liver: Secondary | ICD-10-CM | POA: Diagnosis not present

## 2022-04-29 DIAGNOSIS — Z299 Encounter for prophylactic measures, unspecified: Secondary | ICD-10-CM | POA: Diagnosis not present

## 2022-04-29 DIAGNOSIS — F419 Anxiety disorder, unspecified: Secondary | ICD-10-CM | POA: Diagnosis not present

## 2022-04-29 DIAGNOSIS — F23 Brief psychotic disorder: Secondary | ICD-10-CM | POA: Diagnosis not present

## 2022-04-29 DIAGNOSIS — R279 Unspecified lack of coordination: Secondary | ICD-10-CM | POA: Diagnosis not present

## 2022-04-29 DIAGNOSIS — F22 Delusional disorders: Secondary | ICD-10-CM | POA: Diagnosis not present

## 2022-04-29 DIAGNOSIS — I700 Atherosclerosis of aorta: Secondary | ICD-10-CM | POA: Diagnosis not present

## 2022-04-29 DIAGNOSIS — S32010A Wedge compression fracture of first lumbar vertebra, initial encounter for closed fracture: Secondary | ICD-10-CM | POA: Diagnosis not present

## 2022-04-29 DIAGNOSIS — E785 Hyperlipidemia, unspecified: Secondary | ICD-10-CM | POA: Diagnosis not present

## 2022-04-29 DIAGNOSIS — F0392 Unspecified dementia, unspecified severity, with psychotic disturbance: Secondary | ICD-10-CM | POA: Diagnosis not present

## 2022-04-29 DIAGNOSIS — Z9181 History of falling: Secondary | ICD-10-CM | POA: Diagnosis not present

## 2022-04-29 DIAGNOSIS — I25119 Atherosclerotic heart disease of native coronary artery with unspecified angina pectoris: Secondary | ICD-10-CM | POA: Diagnosis not present

## 2022-04-29 DIAGNOSIS — K5900 Constipation, unspecified: Secondary | ICD-10-CM | POA: Diagnosis not present

## 2022-04-29 DIAGNOSIS — G8929 Other chronic pain: Secondary | ICD-10-CM | POA: Diagnosis not present

## 2022-04-29 DIAGNOSIS — S32020A Wedge compression fracture of second lumbar vertebra, initial encounter for closed fracture: Secondary | ICD-10-CM | POA: Diagnosis not present

## 2022-04-29 DIAGNOSIS — S32010D Wedge compression fracture of first lumbar vertebra, subsequent encounter for fracture with routine healing: Secondary | ICD-10-CM | POA: Diagnosis not present

## 2022-04-29 DIAGNOSIS — R072 Precordial pain: Secondary | ICD-10-CM | POA: Diagnosis not present

## 2022-04-29 DIAGNOSIS — Z743 Need for continuous supervision: Secondary | ICD-10-CM | POA: Diagnosis not present

## 2022-04-29 DIAGNOSIS — F411 Generalized anxiety disorder: Secondary | ICD-10-CM | POA: Diagnosis not present

## 2022-04-29 DIAGNOSIS — F03B2 Unspecified dementia, moderate, with psychotic disturbance: Secondary | ICD-10-CM | POA: Diagnosis not present

## 2022-04-29 DIAGNOSIS — R41841 Cognitive communication deficit: Secondary | ICD-10-CM | POA: Diagnosis not present

## 2022-04-29 DIAGNOSIS — J9601 Acute respiratory failure with hypoxia: Secondary | ICD-10-CM | POA: Diagnosis not present

## 2022-04-29 DIAGNOSIS — M4850XA Collapsed vertebra, not elsewhere classified, site unspecified, initial encounter for fracture: Secondary | ICD-10-CM | POA: Diagnosis not present

## 2022-04-29 DIAGNOSIS — R2681 Unsteadiness on feet: Secondary | ICD-10-CM | POA: Diagnosis not present

## 2022-04-29 DIAGNOSIS — I10 Essential (primary) hypertension: Secondary | ICD-10-CM | POA: Diagnosis not present

## 2022-04-29 MED ORDER — AMLODIPINE BESYLATE 5 MG PO TABS
10.0000 mg | ORAL_TABLET | Freq: Every day | ORAL | Status: DC
  Administered 2022-04-29: 10 mg via ORAL
  Filled 2022-04-29: qty 2

## 2022-04-29 MED ORDER — HYDRALAZINE HCL 20 MG/ML IJ SOLN
10.0000 mg | Freq: Once | INTRAMUSCULAR | Status: AC
  Administered 2022-04-29: 10 mg via INTRAVENOUS
  Filled 2022-04-29: qty 1

## 2022-04-29 MED ORDER — QUETIAPINE FUMARATE 25 MG PO TABS: 12.5000 mg | ORAL_TABLET | Freq: Every day | ORAL | Status: DC

## 2022-04-29 NOTE — Progress Notes (Signed)
Called report to Roxanne at [facility]. Pt is resting comfortably in room awaiting transport.

## 2022-04-29 NOTE — Progress Notes (Signed)
Occupational Therapy Treatment Patient Details Name: Tashema Tiller MRN: [id number] DOB: 02/04/1931 Today's Date: 04/29/2022   History of present illness Shaunee Mulkern is a 88 y.o. female with medical history significant of hypertension, hyperlipidemia, anxiety, and GERD who presents with complaints of abdominal pain.  Pain is in the lower abdomen and into the back.  Reports associated symptoms of chest discomfort, urinary frequency, constipation for which her last bowel movement was 3-4 days ago.  Patient previously had fallen and sustained a compression fracture of L2 back in February for which she was sent to rehab.  She does not report any recent falls onset symptoms. Due to her symptoms she was needing significant assistance to get up.  CT angiogram of the chest abdomen pelvis noted new L1 compression fracture with chronic L2 compression fracture, distention of the cecum without signs of infection.   OT comments  Pt agreeable to OT treatment but preferred to stay in chair due to reports that she is having uncontrolled bowel movements. Pt demonstrated good activity tolerance for grooming while seated in chair with set up to mod I level of assist. Pt able to complete B UE strengthening x10 reps and 2 sets with good endurance. Pt left in chair with call bell within reach. Pt will benefit from continued OT in the hospital and recommended venue below to increase strength, balance, and endurance for safe ADL's.      Recommendations for follow up therapy are one component of a multi-disciplinary discharge planning process, led by the attending physician.  Recommendations may be updated based on patient status, additional functional criteria and insurance authorization.    Follow Up Recommendations  Skilled nursing-short term rehab (<3 hours/day)    Assistance Recommended at Discharge Frequent or constant Supervision/Assistance  Patient can return home with the following  A lot of help with  walking and/or transfers;A lot of help with bathing/dressing/bathroom;Assistance with cooking/housework;Direct supervision/assist for medications management;Direct supervision/assist for financial management;Assist for transportation;Help with stairs or ramp for entrance   Equipment Recommendations  None recommended by OT    Recommendations for Other Services      Precautions / Restrictions Precautions Precautions: Fall Restrictions Weight Bearing Restrictions: No                                                     ADL either performed or assessed with clinical judgement   ADL Overall ADL's : Needs assistance/impaired     Grooming: Wash/dry face;Applying deodorant;Set up;Sitting;Modified independent Grooming Details (indicate cue type and reason): Pt able to complete washing of face and application of deodorant if handed material while seated in chair. Able to open deodorant without assist.             Lower Body Dressing: Modified independent;Sitting/lateral leans Lower Body Dressing Details (indicate cue type and reason): Able to doff and don socks with mild labored movement seated in recliner.                      Cognition Arousal/Alertness: Awake/alert Behavior During Therapy: WFL for tasks assessed/performed Overall Cognitive Status: Within Functional Limits for tasks assessed  Exercises Exercises: General Upper Extremity General Exercises - Upper Extremity Shoulder Flexion: AAROM, 20 reps, Both, Seated (using cane; 2 sets of 10 reps) Shoulder ABduction: AROM, Both, 20 reps, Seated (2x10 reps) Shoulder Horizontal ABduction: AROM, Both, 20 reps, Seated (2x10 reps; same for protraction seated in chair)            Pertinent Vitals/ Pain       Pain Assessment Pain Assessment: Faces Faces Pain Scale: No hurt                                                           Frequency  Min 1X/week        Progress Toward Goals  OT Goals(current goals can now be found in the care plan section)  Progress towards OT goals: Progressing toward goals  Acute Rehab OT Goals Patient Stated Goal: To go back to ALF OT Goal Formulation: With patient Time For Goal Achievement: 05/10/22 Potential to Achieve Goals: Good ADL Goals Pt Will Perform Eating: with modified independence;sitting Pt Will Perform Grooming: with modified independence;sitting;standing Pt Will Perform Upper Body Dressing: with modified independence;sitting Pt Will Transfer to Toilet: with supervision;stand pivot transfer;ambulating;bedside commode;regular height toilet Pt Will Perform Toileting - Clothing Manipulation and hygiene: with supervision;sitting/lateral leans;sit to/from stand Pt/caregiver will Perform Home Exercise Program: Increased strength;Both right and left upper extremity;With Supervision;With written HEP provided  Plan Discharge plan remains appropriate                                    End of Session    OT Visit Diagnosis: Muscle weakness (generalized) (M62.81);History of falling (Z91.81)   Activity Tolerance Patient tolerated treatment well   Patient Left in chair;with call bell/phone within reach   Nurse Communication          Time: 0822-0840 OT Time Calculation (min): 18 min  Charges: OT General Charges $OT Visit: 1 Visit OT Treatments $Therapeutic Exercise: 8-22 mins  Lilee Aldea OT, MOT  Larey Seat 04/29/2022, 8:52 AM

## 2022-04-29 NOTE — Discharge Summary (Signed)
Physician Discharge Summary  Heather [name] NKN:[id number] DOB: 12/25/1930 DOA: 04/25/2022  PCP: Glenda Chroman, MD  Admit date: 04/25/2022 Discharge date: 04/29/2022  Admitted From: home Disposition:  SNF  Recommendations for Outpatient Follow-up:  Follow up with PCP in 1-2 weeks Please obtain BMP/CBC in one week Outpatient follow up with GI for colonoscopy will be arranged by GI   Discharge Condition:stable CODE STATUS:full code Diet recommendation: heart healthy  Brief/Interim Summary: 88 year old female with history of hypertension, hyperlipidemia, anxiety, GERD, IBS presenting with abdominal pain in the lower abdomen.  Notably, the patient was seen at [facility] ED on 04/22/2022 with complaints of urinary frequency and lower abdominal pain and low back pain.  At that time, the patient stated that she was unable to stop urinating.   CT of the abdomen and pelvis was obtained during that ED visit and showed no acute process.  Specifically the CT also showed L2 compression fracture I was present from CT earlier in the month.  There was diverticulosis without any diverticulitis and bilateral inguinal hernias without incarceration.  The patient was given IV morphine, Zofran, and 1 L of IV fluids.  With improvement of her pain.  During that ED's visit, WBC was 12.0 with hemoglobin 16.6 and platelets 246,000.  Her BMP was unremarkable with sodium 140, potassium 3.5, bicarbonate 29, and serum creatinine 0.86.  UA was negative for any pyuria or hematuria at that time.  She was discharged home in stable condition.She complained of sharp pains in her lower abdomen and radiating to her back without any fevers or chills.  Further history reveals that the patient has been struggling with constipation.  She states that she had not had a bowel movement for about 2 weeks.  As result, she had noted increasing abdominal pain over that period of time.  She denies any hematochezia, melena, nausea, vomiting.  There is no  dysuria, but she has complained of urinary frequency.  There is no hematuria.  She denies any fevers or chills.  She stated that the pain was primarily in the lower abdomen right greater than left. In addition, the patient states that she has had 3 falls in the past month, most recently about 2 weeks prior to this admission.  There is no syncope.  Apparently she was in SNF rehab about 2 months ago. ED Upon admission to the emergency department patient was seen to be afebrile with O2 saturations as low as 88% with improvement on 2 L nasal cannula oxygen.  Labs noted WBC 10.5, hemoglobin 16.4, high-sensitivity troponins negative x2, and D-dimer 1.87.  Urinalysis did not show any significant signs of infection.  CT angiogram of the chest abdomen pelvis noted new L1 compression fracture with chronic L2 compression fracture, distention of the cecum without signs of infection.   Discharge Diagnoses:  Principal Problem:   Closed compression fracture of body of L1 vertebra (HCC) Active Problems:   Acute respiratory failure with hypoxia (HCC)   Constipation   Essential hypertension   Anxiety   GERD (gastroesophageal reflux disease)   Chest pain   Pressure injury of skin   Abnormal CT of the abdomen   Chronic back pain  Closed compression fracture of body of L1 vertebra (HCC) Noted on CTA abdomen -Secondary to mechanical falls -Judicious opioids -PT evaluation with recommendations for SNF -CT also noted previous compression fractures T12 and L2.   Acute respiratory failure with hypoxia (HCC) Oxygen saturation 86% on room air with shortness of breath. 730 CTA chest--  negative for PE--shows bibasilar atelectasis.  There is no consolidation.  There is GGO in the left upper lobe. -PCT negative -Suspect possible component of atelectatic disease  -Viral respiratory panel negative -Stable on RA   Constipation Abdominal pain seems to be improving after bowel movements here in the hospital -Continue  MiraLAX, lactulose and Senokot   Essential hypertension Metoprolol increased to home dose of 25 mg twice daily Restarted on home dose of norvasc   Anxiety PDMP reviewed -- Patient receives Xanax 1 mg, #60, last refilled 04/06/2022 -Family feels that she may be oversedated at home, Xanax decreased to 0.5 mg   GERD (gastroesophageal reflux disease) Continue pantoprazole   Chronic back pain PDMP reviewed -- Patient receives tramadol # 60, last refilled 03/30/2022 -Continue gabapentin -Also previously had a prescription for hydrocodone 5/325, #20 on 02/09/2022   Abnormal CT of the abdomen 05/22/2022 CT abdomen--marked distention of the cecum extending to the hepatic flexure and transverse colon with air-fluid levels -Suspect this may be due to ileus vs. Psbo from the patient's constipation -GI following -Patient is now having bowel movements -Plans are for outpatient colonoscopy   Chest pain Atypical by clinical history -CTA chest negative for PE -Reproducible on exam by palpation -Echocardiogram shows preserved EF, no WMA -Troponin 3>>> 3 -Personally reviewed EKG--sinus rhythm, nonspecific T wave change  Confusion, likely dementia with some agitation -has some paranoid episodes -suspect hospital delirium -started on seroquel qhs  Discharge Instructions  Discharge Instructions     Diet - low sodium heart healthy   Complete by: As directed    Diet - low sodium heart healthy   Complete by: As directed    Increase activity slowly   Complete by: As directed    Increase activity slowly   Complete by: As directed    No wound care   Complete by: As directed    No wound care   Complete by: As directed       Allergies as of 04/29/2022   No Known Allergies      Medication List     STOP taking these medications    alum & mag hydroxide-simeth 200-200-20 MG/5ML suspension Commonly known as: MAALOX/MYLANTA   dicyclomine 10 MG capsule Commonly known as: BENTYL   Milk  of Magnesia 400 MG/5ML suspension Generic drug: magnesium hydroxide   traMADol 50 MG tablet Commonly known as: ULTRAM       TAKE these medications    acetaminophen 500 MG tablet Commonly known as: TYLENOL Take 500 mg by mouth every 4 (four) hours as needed for mild pain, moderate pain or fever.   albuterol 108 (90 Base) MCG/ACT inhaler Commonly known as: VENTOLIN HFA Inhale 2 puffs into the lungs every 4 (four) hours as needed for wheezing or shortness of breath.   ALPRAZolam 0.5 MG tablet Commonly known as: XANAX Take 2 tablets (1 mg total) by mouth 3 (three) times daily. What changed: medication strength   amLODipine 10 MG tablet Commonly known as: NORVASC Take 10 mg by mouth every morning.   aspirin 81 MG tablet Take 1 tablet (81 mg total) by mouth daily with breakfast.   CAL-MAG-ZINC PO Take 1 tablet by mouth daily.   Diclofenac Sodium 3 % Gel Apply 1-2 g topically in the morning, at noon, and at bedtime.   ferrous sulfate 325 (65 FE) MG tablet Take 325 mg by mouth daily with breakfast.   fluticasone furoate-vilanterol 100-25 MCG/ACT Aepb Commonly known as: BREO ELLIPTA Inhale 1 puff into  the lungs daily.   furosemide 40 MG tablet Commonly known as: LASIX Take 40 mg by mouth daily.   gabapentin 300 MG capsule Commonly known as: NEURONTIN Take 300 mg by mouth daily.   HYDROcodone-acetaminophen 5-325 MG tablet Commonly known as: NORCO/VICODIN Take 1 tablet by mouth every 6 (six) hours as needed for moderate pain. What changed: when to take this   isosorbide mononitrate 60 MG 24 hr tablet Commonly known as: IMDUR Take 60 mg by mouth daily.   lactulose 10 GM/15ML solution Commonly known as: CHRONULAC Take 30 mLs (20 g total) by mouth 2 (two) times daily. Goal is to Have at least 2 mushy/Non-solid bowel movements Each day-- May give 1 additional dose of lactulose if no bowel movement in 24 hr period What changed: additional instructions   lidocaine 5  % Commonly known as: LIDODERM Place 1 patch onto the skin daily. Remove & Discard patch within 12 hours or as directed by MD   metoprolol tartrate 25 MG tablet Commonly known as: LOPRESSOR Take 25 mg by mouth 2 (two) times daily.   nitroGLYCERIN 0.4 MG SL tablet Commonly known as: NITROSTAT Place 0.4 mg under the tongue every 5 (five) minutes as needed for chest pain.   omeprazole 40 MG capsule Commonly known as: PRILOSEC Take 40 mg by mouth daily.   Opcon-A 0.027-0.315 % Soln Generic drug: Naphazoline-Pheniramine Place 1 drop into both eyes 2 (two) times daily as needed (dry eyes).   polyethylene glycol 17 g packet Commonly known as: MIRALAX / GLYCOLAX Take 17 g by mouth daily as needed.   potassium chloride 10 MEQ tablet Commonly known as: KLOR-CON Take 1 tablet (10 mEq total) by mouth daily. Take While taking Lactulose   QUEtiapine 25 MG tablet Commonly known as: SEROQUEL Take 0.5 tablets (12.5 mg total) by mouth at bedtime.   senna-docusate 8.6-50 MG tablet Commonly known as: Senokot-S Take 1 tablet by mouth 2 (two) times daily.   sucralfate 1 g tablet Commonly known as: CARAFATE Take 1 g by mouth 4 (four) times daily.        Contact information for after-discharge care     Destination     [facility] .   Service: Skilled Nursing Contact information: [address] [place] [phone]                    No Known Allergies  Consultations: GI   Procedures/Studies: ECHOCARDIOGRAM COMPLETE  Result Date: 04/26/2022    ECHOCARDIOGRAM REPORT   Patient Name:   KARRA PINK Date of Exam: 04/26/2022 Medical Rec #:  [id number]          Height:       67.0 in Accession #:    [id number]         Weight:       171.7 lb Date of Birth:  Mar 16, 1931          BSA:          1.895 m Patient Age:    88 years           BP:           150/69 mmHg Patient Gender: F                   HR:           80 bpm. Exam Location:  [facility] Procedure: 2D Echo Indications:  chest pain.  History:        Patient has prior history of Echocardiogram examinations, most                 recent 12/27/2019. Risk Factors:Hypertension.  Sonographer:    Johny Chess RDCS Referring Phys: [phone] DAVID TAT IMPRESSIONS  1. Left ventricular ejection fraction, by estimation, is 65 to 70%. The left ventricle has normal function. The left ventricle has no regional wall motion abnormalities. There is mild left ventricular hypertrophy. Left ventricular diastolic parameters are consistent with Grade I diastolic dysfunction (impaired relaxation).  2. Right ventricular systolic function is normal. The right ventricular size is normal.  3. The mitral valve is normal in structure. No evidence of mitral valve regurgitation. No evidence of mitral stenosis.  4. The aortic valve is normal in structure. Aortic valve regurgitation is not visualized. No aortic stenosis is present.  5. The inferior vena cava is normal in size with greater than 50% respiratory variability, suggesting right atrial pressure of 3 mmHg. Comparison(s): No significant change from prior study. Prior images reviewed side by side. FINDINGS  Left Ventricle: Left ventricular ejection fraction, by estimation, is 65 to 70%. The left ventricle has normal function. The left ventricle has no regional wall motion abnormalities. The left ventricular internal cavity size was normal in size. There is  mild left ventricular hypertrophy. Left ventricular diastolic parameters are consistent with Grade I diastolic dysfunction (impaired relaxation). Right Ventricle: The right ventricular size is normal. No increase in right ventricular wall thickness. Right ventricular systolic function is normal. Left Atrium: Left atrial size was normal in size. Right Atrium: Right atrial size was normal in size. Pericardium: There is no evidence of pericardial effusion. Mitral Valve: The  mitral valve is normal in structure. No evidence of mitral valve regurgitation. No evidence of mitral valve stenosis. Tricuspid Valve: The tricuspid valve is normal in structure. Tricuspid valve regurgitation is not demonstrated. No evidence of tricuspid stenosis. Aortic Valve: The aortic valve is normal in structure. Aortic valve regurgitation is not visualized. No aortic stenosis is present. Pulmonic Valve: The pulmonic valve was normal in structure. Pulmonic valve regurgitation is trivial. No evidence of pulmonic stenosis. Aorta: The aortic root is normal in size and structure. Venous: The inferior vena cava is normal in size with greater than 50% respiratory variability, suggesting right atrial pressure of 3 mmHg. IAS/Shunts: No atrial level shunt detected by color flow Doppler.  LEFT VENTRICLE PLAX 2D LVIDd:         4.40 cm   Diastology LVIDs:         3.30 cm   LV e' medial:    6.09 cm/s LV PW:         1.30 cm   LV E/e' medial:  8.5 LV IVS:        0.90 cm   LV e' lateral:   9.90 cm/s LVOT diam:     2.00 cm   LV E/e' lateral: 5.3 LV SV:         53 LV SV Index:   28 LVOT Area:     3.14 cm  RIGHT VENTRICLE             IVC RV S prime:     23.90 cm/s  IVC diam: 1.90 cm TAPSE (M-mode): 1.6 cm LEFT ATRIUM             Index        RIGHT ATRIUM  Index LA diam:        4.00 cm 2.11 cm/m   RA Area:     14.60 cm LA Vol (A2C):   31.6 ml 16.67 ml/m  RA Volume:   37.10 ml  19.57 ml/m LA Vol (A4C):   33.5 ml 17.67 ml/m LA Biplane Vol: 33.4 ml 17.62 ml/m  AORTIC VALVE             PULMONIC VALVE LVOT Vmax:   94.10 cm/s  PR End Diast Vel: 7.18 msec LVOT Vmean:  65.100 cm/s LVOT VTI:    0.169 m  AORTA Ao Root diam: 3.10 cm Ao Asc diam:  3.20 cm MV E velocity: 52.00 cm/s MV A velocity: 123.00 cm/s  SHUNTS MV E/A ratio:  0.42         Systemic VTI:  0.17 m                             Systemic Diam: 2.00 cm Candee Furbish MD Electronically signed by Candee Furbish MD Signature Date/Time: 04/26/2022/2:53:41 PM    Final    DG  Abd 2 Views  Result Date: 04/26/2022 CLINICAL DATA:  Abdominal distension and pain. History of diabetes and hypertension. EXAM: ABDOMEN - 2 VIEW COMPARISON:  Plain film of the abdomen dated 11/16/2020 FINDINGS: No dilated large or small bowel loops are identified. No radiographic evidence of constipation. No evidence of soft tissue mass or abnormal fluid collection. No evidence of free intraperitoneal air. No evidence of renal or ureteral calculi. IMPRESSION: No acute findings. Nonobstructive bowel gas pattern. Electronically Signed   By: Franki Cabot M.D.   On: 04/26/2022 13:44   CT Angio Chest PE W and/or Wo Contrast  Result Date: 04/25/2022 CLINICAL DATA:  Pulmonary embolism (PE) suspected, positive D-dimer; Abdominal pain, acute, nonlocalized EXAM: CT ANGIOGRAPHY CHEST CT ABDOMEN AND PELVIS WITH CONTRAST TECHNIQUE: Multidetector CT imaging of the chest was performed using the standard protocol during bolus administration of intravenous contrast. Multiplanar CT image reconstructions and MIPs were obtained to evaluate the vascular anatomy. Multidetector CT imaging of the abdomen and pelvis was performed using the standard protocol during bolus administration of intravenous contrast. RADIATION DOSE REDUCTION: This exam was performed according to the departmental dose-optimization program which includes automated exposure control, adjustment of the mA and/or kV according to patient size and/or use of iterative reconstruction technique. CONTRAST:  113m OMNIPAQUE IOHEXOL 350 MG/ML SOLN COMPARISON:  CT abdomen pelvis 04/22/2022. FINDINGS: CTA CHEST FINDINGS Cardiovascular: Satisfactory opacification of the pulmonary arteries to the segmental level. No evidence of pulmonary embolism. Mild cardiomegaly.No pericardial disease. Coronary artery atherosclerosis. Moderate atherosclerosis of thoracic aorta. Mediastinum/Nodes: No lymphadenopathy. There is a 1.6 cm heterogeneous left thyroid nodule. Small hiatal hernia.  Lungs/Pleura: Bibasilar hypoventilatory changes and atelectasis. No focal airspace consolidation. There is a 4 mm ground-glass nodule in the left upper lobe (series 5, image 58). No follow-up imaging is recommended (SYSCOSociety 2017; Radiology 2017; [phone]. Musculoskeletal: There is a chronic T12 compression deformity with prior vertebroplasty. Review of the MIP images confirms the above findings. CT ABDOMEN and PELVIS FINDINGS Hepatobiliary: No focal liver abnormality is seen. The gallbladder is unremarkable. Pancreas: Unremarkable. No pancreatic ductal dilatation or surrounding inflammatory changes. Spleen: Normal in size without focal abnormality. Adrenals/Urinary Tract: Adrenal glands are unremarkable. No hydronephrosis or nephrolithiasis. There are tiny bilateral renal cysts. The bladder is moderately distended. Stomach/Bowel: Small hiatal hernia. The stomach is otherwise within normal limits. There is  no evidence of bowel obstruction.The appendix is normal. There is marked distension of the cecum and to a lesser degree the a hepatic flexure and transverse colon with air-fluid and fat fluid levels. There is no adjacent stranding.Colonic diverticulosis. No diverticulitis. Vascular/Lymphatic: Aortoiliac atherosclerosis. No AAA. No lymphadenopathy. Reproductive: Unremarkable. Other: Diastasis recti. Bilateral fat containing inguinal hernias. No bowel containing hernia. No ascites. No free air. Musculoskeletal: No acute osseous abnormality. No suspicious osseous lesion. Unchanged compression fracture of L2. No progressive height loss. Unchanged L4 vertebral body hemangioma. There is a new superior endplate compression fracture of L1 with approximately 15% height loss. Unchanged multilevel degenerative disc disease most severe at L5-S1 with trace retrolisthesis. Unchanged grade 1 anterolisthesis at L4-L5. Mild bilateral hip osteoarthritis. Review of the MIP images confirms the above findings.  IMPRESSION: CT CHEST: No evidence of pulmonary embolism.  No focal airspace disease. 1.6 cm left thyroid nodule. If not previously performed, recommend non-emergent thyroid ultrasound as an outpatient. Small hiatal hernia. CT ABDOMEN AND PELVIS: Distension of the cecum and to a lesser degree of the hepatic flexure and transverse colon with air-fluid and fat-fluid levels. No wall thickening or adjacent stranding to suggest colitis, however this could potentially be related the patient's pain/discomfort. Diverticulosis without diverticulitis. Normal appendix. No bowel obstruction. New superior endplate compression fracture of L1 with approximately 15 percent height loss. Unchanged T12 and L2 compression deformities. Electronically Signed   By: Maurine Simmering M.D.   On: 04/25/2022 12:01   CT ABDOMEN PELVIS W CONTRAST  Result Date: 04/25/2022 CLINICAL DATA:  Pulmonary embolism (PE) suspected, positive D-dimer; Abdominal pain, acute, nonlocalized EXAM: CT ANGIOGRAPHY CHEST CT ABDOMEN AND PELVIS WITH CONTRAST TECHNIQUE: Multidetector CT imaging of the chest was performed using the standard protocol during bolus administration of intravenous contrast. Multiplanar CT image reconstructions and MIPs were obtained to evaluate the vascular anatomy. Multidetector CT imaging of the abdomen and pelvis was performed using the standard protocol during bolus administration of intravenous contrast. RADIATION DOSE REDUCTION: This exam was performed according to the departmental dose-optimization program which includes automated exposure control, adjustment of the mA and/or kV according to patient size and/or use of iterative reconstruction technique. CONTRAST:  174m OMNIPAQUE IOHEXOL 350 MG/ML SOLN COMPARISON:  CT abdomen pelvis 04/22/2022. FINDINGS: CTA CHEST FINDINGS Cardiovascular: Satisfactory opacification of the pulmonary arteries to the segmental level. No evidence of pulmonary embolism. Mild cardiomegaly.No pericardial  disease. Coronary artery atherosclerosis. Moderate atherosclerosis of thoracic aorta. Mediastinum/Nodes: No lymphadenopathy. There is a 1.6 cm heterogeneous left thyroid nodule. Small hiatal hernia. Lungs/Pleura: Bibasilar hypoventilatory changes and atelectasis. No focal airspace consolidation. There is a 4 mm ground-glass nodule in the left upper lobe (series 5, image 58). No follow-up imaging is recommended (SYSC[employer]ociety 2017; Radiology 2017; [phone]. Musculoskeletal: There is a chronic T12 compression deformity with prior vertebroplasty. Review of the MIP images confirms the above findings. CT ABDOMEN and PELVIS FINDINGS Hepatobiliary: No focal liver abnormality is seen. The gallbladder is unremarkable. Pancreas: Unremarkable. No pancreatic ductal dilatation or surrounding inflammatory changes. Spleen: Normal in size without focal abnormality. Adrenals/Urinary Tract: Adrenal glands are unremarkable. No hydronephrosis or nephrolithiasis. There are tiny bilateral renal cysts. The bladder is moderately distended. Stomach/Bowel: Small hiatal hernia. The stomach is otherwise within normal limits. There is no evidence of bowel obstruction.The appendix is normal. There is marked distension of the cecum and to a lesser degree the a hepatic flexure and transverse colon with air-fluid and fat fluid levels. There is no adjacent stranding.Colonic diverticulosis. No diverticulitis.  Vascular/Lymphatic: Aortoiliac atherosclerosis. No AAA. No lymphadenopathy. Reproductive: Unremarkable. Other: Diastasis recti. Bilateral fat containing inguinal hernias. No bowel containing hernia. No ascites. No free air. Musculoskeletal: No acute osseous abnormality. No suspicious osseous lesion. Unchanged compression fracture of L2. No progressive height loss. Unchanged L4 vertebral body hemangioma. There is a new superior endplate compression fracture of L1 with approximately 15% height loss. Unchanged multilevel degenerative disc  disease most severe at L5-S1 with trace retrolisthesis. Unchanged grade 1 anterolisthesis at L4-L5. Mild bilateral hip osteoarthritis. Review of the MIP images confirms the above findings. IMPRESSION: CT CHEST: No evidence of pulmonary embolism.  No focal airspace disease. 1.6 cm left thyroid nodule. If not previously performed, recommend non-emergent thyroid ultrasound as an outpatient. Small hiatal hernia. CT ABDOMEN AND PELVIS: Distension of the cecum and to a lesser degree of the hepatic flexure and transverse colon with air-fluid and fat-fluid levels. No wall thickening or adjacent stranding to suggest colitis, however this could potentially be related the patient's pain/discomfort. Diverticulosis without diverticulitis. Normal appendix. No bowel obstruction. New superior endplate compression fracture of L1 with approximately 15 percent height loss. Unchanged T12 and L2 compression deformities. Electronically Signed   By: Maurine Simmering M.D.   On: 04/25/2022 12:01   MR Lumbar Spine W Wo Contrast  Result Date: 04/25/2022 CLINICAL DATA:  Provided history: Low back pain, cauda equina syndrome suspected. EXAM: MRI LUMBAR SPINE WITHOUT AND WITH CONTRAST TECHNIQUE: Multiplanar and multiecho pulse sequences of the lumbar spine were obtained without and with intravenous contrast. CONTRAST:  58m GADAVIST GADOBUTROL 1 MMOL/ML IV SOLN COMPARISON:  CT abdomen/pelvis 04/22/2022. CT abdomen/pelvis 03/31/2019. Lumbar spine MRI 12/20/2021. FINDINGS: Segmentation: 5 lumbar vertebrae. The caudal most well-formed intervertebral disc space is designated L5-S1. Alignment: 2 mm grade 1 retrolisthesis at L1-L2. Unchanged 4 mm bony retropulsion at the level of the L4 inferior endplate. 3 mm L4-L5 grade 1 anterolisthesis. 3 mm L5-S1 grade 1 retrolisthesis. Vertebrae: Redemonstrated chronic T12 vertebral compression fracture (40-50% height loss) post vertebral augmentation. Acute L1 superior endplate vertebral compression fracture  (approximately 20% height loss). Redemonstrated subacute on chronic L2 inferior endplate compression fracture. Approximately 30% height loss at this level, unchanged from the prior CT abdomen/pelvis of 04/22/2022. 4 mm bony retropulsion at the level of the L2 inferior endplate, also unchanged. Marrow edema within the L1 vertebral body, greatest along the superior endplate. Marrow edema within the L2 vertebral body, greatest along the inferior endplate. Mild superior endplate edema also present at L3. There is an unchanged small Schmorl node within the right aspect of the L3 superior endplate without convincing superior endplate compression deformity. Marrow edema within the posterior elements at L3-L4, likely degenerative and related to facet arthrosis. Redemonstrated large L4 vertebral body hemangioma. Conus medullaris and cauda equina: Conus extends to the L2 level. No signal abnormality within the visualized distal spinal cord. Paraspinal and other soft tissues: Bilateral renal cysts. No paraspinal mass or collection. Disc levels: Unless otherwise stated, the level by level findings below have not significantly changed from the prior MRI of 12/20/2021. Multilevel disc degeneration, greatest at L1-L2 (moderate), L2-L3 (moderate) and L5-S1 (advanced). T11-T12: This level is imaged in the sagittal plane only. Disc bulge. Mild facet arthrosis. The disc bulge mildly effaces the ventral thecal sac and contacts the ventral aspect of the spinal cord. No significant foraminal stenosis. T12-L1: Disc bulge. Facet arthrosis (mild right, moderate left). Ligamentum flavum hypertrophy on the left. No significant spinal canal or foraminal stenosis. L1-L2: 2 mm grade 1 retrolisthesis. Disc  bulge. Mild-to-moderate facet arthrosis. Mild to moderate bilateral subarticular narrowing (without appreciable nerve root impingement). Mild narrowing of the central canal. Mild neural foraminal narrowing on the right. L2-L3: 4 mm bony  retropulsion at the level of the L4 inferior endplate, new from the prior MRI of 12/20/2021. Progressive disc bulge. Progressive advanced facet arthrosis with ligamentum flavum hypertrophy. Progressive severe central canal stenosis with bilateral subarticular narrowing. Bilateral neural foraminal narrowing (moderate/severe, mild/moderate left), progressed. L3-L4: Disc bulge. Moderate facet arthrosis with ligamentum flavum hypertrophy. Mild right subarticular narrowing (without appreciable nerve root impingement). Mild relative narrowing of the central canal. Mild right neural foraminal narrowing. L4-L5: 3 mm grade 1 anterolisthesis. Disc uncovering with disc bulge. Advanced facet arthrosis with ligamentum flavum hypertrophy. Bilateral subarticular narrowing (mild-to-moderate right, mild left) with medialization of the descending right L5 nerve root. Moderate central canal stenosis. Minimal relative left neural foraminal narrowing. L5-S1: 3 mm grade 1 retrolisthesis. Disc bulge with endplate osteophytes. Right center/subarticular posterior annular fissure. Facet arthrosis (mild right, moderate left) with ligamentum flavum hypertrophy. Right subarticular stenosis with impingement of the descending right S1 nerve root. Mild left subarticular narrowing (without appreciable nerve root impingement at this site). No significant central canal stenosis. Bilateral neural foraminal narrowing (mild right, moderate left). IMPRESSION: Acute L1 superior endplate vertebral compression fracture (approximately 20% height loss). No significant bony retropulsion at this level. Subacute on chronic L2 inferior endplate vertebral compression fracture. Approximately 30% height loss at this level, unchanged from the recent prior CT abdomen/pelvis of 04/22/2022. At L2-L3, 4 mm bony retropulsion at the level of the L2 inferior endplate and superimposed spondylosis contribute to progressive severe central canal stenosis with bilateral  subarticular narrowing. Bilateral neural foraminal narrowing (moderate/severe right, mild-to-moderate left), also progressed. Mild marrow edema along the L3 superior endplate. Unchanged small Schmorl node within the right aspect of the L3 superior endplate, but no convincing L3 superior endplate compression fracture. Unchanged chronic T12 vertebral compression fracture status post vertebral augmentation. Additional lumbar spondylosis, as outlined and otherwise unchanged from the prior MRI of 12/20/2021. At these remaining levels, spondylosis is greatest at L1-L2, L4-L5 and L5-S1. Electronically Signed   By: Kellie Simmering D.O.   On: 04/25/2022 11:01   DG INJECT DIAG/THERA/INC NEEDLE/CATH/PLC EPI/LUMB/SAC W/IMG  Result Date: 04/06/2022 CLINICAL DATA:  Lumbosacral spondylosis without myelopathy. Moderate spinal stenosis at L4-5. Sacrococcygeal and right leg pain. The patient also has a subacute L2 compression fracture, however she denies significant upper lumbar back pain. FLUOROSCOPY: Radiation Exposure Index (as provided by the fluoroscopic device): 4.50 mGy Kerma PROCEDURE: The procedure, risks, benefits, and alternatives were explained to the patient. Questions regarding the procedure were encouraged and answered. The patient understands and consents to the procedure. LUMBAR EPIDURAL INJECTION: An interlaminar approach was performed on the right at L5-S1. The overlying skin was cleansed and anesthetized. A 3.5 inch 20 gauge epidural needle was advanced using loss-of-resistance technique. DIAGNOSTIC EPIDURAL INJECTION: Injection of Isovue-M 200 shows a good epidural pattern with spread above and below the level of needle placement, primarily on the right. No vascular opacification is seen. THERAPEUTIC EPIDURAL INJECTION: 80 mg of Depo-Medrol mixed with 2 mL of 1% lidocaine were instilled. The injection reproduced the patient's sacrococcygeal and right leg pain. The procedure was well-tolerated, and the patient  was discharged thirty minutes following the injection in good condition. COMPLICATIONS: None immediate IMPRESSION: Technically successful interlaminar epidural injection on the right at L5-S1. Electronically Signed   By: Logan Bores M.D.   On: 04/06/2022 13:29  Subjective: Sitting up in chair, no new complaints  Discharge Exam: Vitals:   04/29/22 0100 04/29/22 0243 04/29/22 0626 04/29/22 1241  BP: (!) 158/79 (!) 175/76 (!) 183/85 (!) 122/57  Pulse: (!) 108 (!) 105 99 83  Resp:  (!) 22 (!) 23 17  Temp:  98.3 F (36.8 C) 98.2 F (36.8 C) 98.1 F (36.7 C)  TempSrc:      SpO2: 93% 93% 95% 94%  Weight:      Height:        General: Pt is alert, awake, not in acute distress Cardiovascular: RRR, S1/S2 +, no rubs, no gallops Respiratory: CTA bilaterally, no wheezing, no rhonchi Abdominal: Soft, NT, ND, bowel sounds + Extremities: no edema, no cyanosis    The results of significant diagnostics from this hospitalization (including imaging, microbiology, ancillary and laboratory) are listed below for reference.     Microbiology: Recent Results (from the past 240 hour(s))  Respiratory (~20 pathogens) panel by PCR     Status: None   Collection Time: 04/26/22  5:57 PM   Specimen: Nasopharyngeal Swab; Respiratory  Result Value Ref Range Status   Adenovirus NOT DETECTED NOT DETECTED Final   Coronavirus 229E NOT DETECTED NOT DETECTED Final    Comment: (NOTE) The Coronavirus on the Respiratory Panel, DOES NOT test for the novel  Coronavirus (2019 nCoV)    Coronavirus HKU1 NOT DETECTED NOT DETECTED Final   Coronavirus NL63 NOT DETECTED NOT DETECTED Final   Coronavirus OC43 NOT DETECTED NOT DETECTED Final   Metapneumovirus NOT DETECTED NOT DETECTED Final   Rhinovirus / Enterovirus NOT DETECTED NOT DETECTED Final   Influenza A NOT DETECTED NOT DETECTED Final   Influenza B NOT DETECTED NOT DETECTED Final   Parainfluenza Virus 1 NOT DETECTED NOT DETECTED Final   Parainfluenza Virus  2 NOT DETECTED NOT DETECTED Final   Parainfluenza Virus 3 NOT DETECTED NOT DETECTED Final   Parainfluenza Virus 4 NOT DETECTED NOT DETECTED Final   Respiratory Syncytial Virus NOT DETECTED NOT DETECTED Final   Bordetella pertussis NOT DETECTED NOT DETECTED Final   Bordetella Parapertussis NOT DETECTED NOT DETECTED Final   Chlamydophila pneumoniae NOT DETECTED NOT DETECTED Final   Mycoplasma pneumoniae NOT DETECTED NOT DETECTED Final    Comment: Performed at [facility], [place]. [address], [place], [place] [address]     Labs: BNP (last 3 results) No results for input(s): "BNP" in the last 8760 hours. Basic Metabolic Panel: Recent Labs  Lab 04/25/22 0902 04/26/22 0445 04/27/22 0440 04/28/22 0405  NA 137 136 139 138  K 3.7 3.9 3.6 3.7  CL 100 99 105 108  CO2 '28 27 27 24  '$ GLUCOSE 100* 129* 80 95  BUN '19 19 12 13  '$ CREATININE 0.82 0.97 0.74 0.65  CALCIUM 9.5 8.9 8.3* 8.7*  MG 2.4  --   --   --    Liver Function Tests: Recent Labs  Lab 04/25/22 0902  AST 22  ALT 21  ALKPHOS 94  BILITOT 0.5  PROT 8.0  ALBUMIN 4.0   Recent Labs  Lab 04/25/22 0902  LIPASE 26   Recent Labs  Lab 04/26/22 1733  AMMONIA 23   CBC: Recent Labs  Lab 04/25/22 0902 04/26/22 0445 04/27/22 0440 04/28/22 0405  WBC 10.5 12.0* 8.3 8.7  NEUTROABS 6.6  --   --   --   HGB 16.4* 15.1* 12.9 13.5  HCT 48.8* 45.6 40.1 40.4  MCV 88.2 89.4 91.1 90.0  PLT 208 194 145*  143*   Cardiac Enzymes: No results for input(s): "CKTOTAL", "CKMB", "CKMBINDEX", "TROPONINI" in the last 168 hours. BNP: Invalid input(s): "POCBNP" CBG: No results for input(s): "GLUCAP" in the last 168 hours. D-Dimer No results for input(s): "DDIMER" in the last 72 hours. Hgb A1c No results for input(s): "HGBA1C" in the last 72 hours. Lipid Profile No results for input(s): "CHOL", "HDL", "LDLCALC", "TRIG", "CHOLHDL", "LDLDIRECT" in the last 72 hours. Thyroid function studies No results for input(s): "TSH", "T4TOTAL",  "T3FREE", "THYROIDAB" in the last 72 hours.  Invalid input(s): "FREET3" Anemia work up No results for input(s): "VITAMINB12", "FOLATE", "FERRITIN", "TIBC", "IRON", "RETICCTPCT" in the last 72 hours. Urinalysis    Component Value Date/Time   COLORURINE YELLOW 04/25/2022 0852   APPEARANCEUR HAZY (A) 04/25/2022 0852   LABSPEC 1.017 04/25/2022 0852   PHURINE 7.0 04/25/2022 0852   GLUCOSEU NEGATIVE 04/25/2022 0852   HGBUR NEGATIVE 04/25/2022 0852   BILIRUBINUR NEGATIVE 04/25/2022 0852   KETONESUR NEGATIVE 04/25/2022 0852   PROTEINUR NEGATIVE 04/25/2022 0852   NITRITE NEGATIVE 04/25/2022 0852   LEUKOCYTESUR NEGATIVE 04/25/2022 0852   Sepsis Labs Recent Labs  Lab 04/25/22 0902 04/26/22 0445 04/27/22 0440 04/28/22 0405  WBC 10.5 12.0* 8.3 8.7   Microbiology Recent Results (from the past 240 hour(s))  Respiratory (~20 pathogens) panel by PCR     Status: None   Collection Time: 04/26/22  5:57 PM   Specimen: Nasopharyngeal Swab; Respiratory  Result Value Ref Range Status   Adenovirus NOT DETECTED NOT DETECTED Final   Coronavirus 229E NOT DETECTED NOT DETECTED Final    Comment: (NOTE) The Coronavirus on the Respiratory Panel, DOES NOT test for the novel  Coronavirus (2019 nCoV)    Coronavirus HKU1 NOT DETECTED NOT DETECTED Final   Coronavirus NL63 NOT DETECTED NOT DETECTED Final   Coronavirus OC43 NOT DETECTED NOT DETECTED Final   Metapneumovirus NOT DETECTED NOT DETECTED Final   Rhinovirus / Enterovirus NOT DETECTED NOT DETECTED Final   Influenza A NOT DETECTED NOT DETECTED Final   Influenza B NOT DETECTED NOT DETECTED Final   Parainfluenza Virus 1 NOT DETECTED NOT DETECTED Final   Parainfluenza Virus 2 NOT DETECTED NOT DETECTED Final   Parainfluenza Virus 3 NOT DETECTED NOT DETECTED Final   Parainfluenza Virus 4 NOT DETECTED NOT DETECTED Final   Respiratory Syncytial Virus NOT DETECTED NOT DETECTED Final   Bordetella pertussis NOT DETECTED NOT DETECTED Final   Bordetella  Parapertussis NOT DETECTED NOT DETECTED Final   Chlamydophila pneumoniae NOT DETECTED NOT DETECTED Final   Mycoplasma pneumoniae NOT DETECTED NOT DETECTED Final    Comment: Performed at [facility], [place]. [address], [place], [place] [address]     Time coordinating discharge: 37mns  SIGNED:   JKathie Dike MD  [facility] 04/29/2022, 12:42 PM   If 7PM-7AM, please contact night-coverage [email or web address]

## 2022-04-29 NOTE — Progress Notes (Signed)
Ng Discharge Note  Admit Date:  04/25/2022 Discharge date: 04/29/2022   [facility]el[facility] Noun to be D/C'd Skilled nursing facility per MD order.  AVS completed. Patient/caregiver able to verbalize understanding.  Discharge Medication: Allergies as of 04/29/2022   No Known Allergies      Medication List     STOP taking these medications    alum & mag hydroxide-simeth 200-200-20 MG/5ML suspension Commonly known as: MAALOX/MYLANTA   dicyclomine 10 MG capsule Commonly known as: BENTYL   Milk of Magnesia 400 MG/5ML suspension Generic drug: magnesium hydroxide   traMADol 50 MG tablet Commonly known as: ULTRAM       TAKE these medications    acetaminophen 500 MG tablet Commonly known as: TYLENOL Take 500 mg by mouth every 4 (four) hours as needed for mild pain, moderate pain or fever.   albuterol 108 (90 Base) MCG/ACT inhaler Commonly known as: VENTOLIN HFA Inhale 2 puffs into the lungs every 4 (four) hours as needed for wheezing or shortness of breath.   ALPRAZolam 0.5 MG tablet Commonly known as: XANAX Take 2 tablets (1 mg total) by mouth 3 (three) times daily. What changed: medication strength   amLODipine 10 MG tablet Commonly known as: NORVASC Take 10 mg by mouth every morning.   aspirin 81 MG tablet Take 1 tablet (81 mg total) by mouth daily with breakfast.   CAL-MAG-ZINC PO Take 1 tablet by mouth daily.   Diclofenac Sodium 3 % Gel Apply 1-2 g topically in the morning, at noon, and at bedtime.   ferrous sulfate 325 (65 FE) MG tablet Take 325 mg by mouth daily with breakfast.   fluticasone furoate-vilanterol 100-25 MCG/ACT Aepb Commonly known as: BREO ELLIPTA Inhale 1 puff into the lungs daily.   furosemide 40 MG tablet Commonly known as: LASIX Take 40 mg by mouth daily.   gabapentin 300 MG capsule Commonly known as: NEURONTIN Take 300 mg by mouth daily.   HYDROcodone-acetaminophen 5-325 MG tablet Commonly known as: NORCO/VICODIN Take 1 tablet by  mouth every 6 (six) hours as needed for moderate pain. What changed: when to take this   isosorbide mononitrate 60 MG 24 hr tablet Commonly known as: IMDUR Take 60 mg by mouth daily.   lactulose 10 GM/15ML solution Commonly known as: CHRONULAC Take 30 mLs (20 g total) by mouth 2 (two) times daily. Goal is to Have at least 2 mushy/Non-solid bowel movements Each day-- May give 1 additional dose of lactulose if no bowel movement in 24 hr period What changed: additional instructions   lidocaine 5 % Commonly known as: LIDODERM Place 1 patch onto the skin daily. Remove & Discard patch within 12 hours or as directed by MD   metoprolol tartrate 25 MG tablet Commonly known as: LOPRESSOR Take 25 mg by mouth 2 (two) times daily.   nitroGLYCERIN 0.4 MG SL tablet Commonly known as: NITROSTAT Place 0.4 mg under the tongue every 5 (five) minutes as needed for chest pain.   omeprazole 40 MG capsule Commonly known as: PRILOSEC Take 40 mg by mouth daily.   Opcon-A 0.027-0.315 % Soln Generic drug: Naphazoline-Pheniramine Place 1 drop into both eyes 2 (two) times daily as needed (dry eyes).   polyethylene glycol 17 g packet Commonly known as: MIRALAX / GLYCOLAX Take 17 g by mouth daily as needed.   potassium chloride 10 MEQ tablet Commonly known as: KLOR-CON Take 1 tablet (10 mEq total) by mouth daily. Take While taking Lactulose   QUEtiapine 25 MG tablet Commonly known  as: SEROQUEL Take 0.5 tablets (12.5 mg total) by mouth at bedtime.   senna-docusate 8.6-50 MG tablet Commonly known as: Senokot-S Take 1 tablet by mouth 2 (two) times daily.   sucralfate 1 g tablet Commonly known as: CARAFATE Take 1 g by mouth 4 (four) times daily.        Discharge Assessment: Vitals:   04/29/22 0626 04/29/22 1241  BP: (!) 183/85 (!) 122/57  Pulse: 99 83  Resp: (!) 23 17  Temp: 98.2 F (36.8 C) 98.1 F (36.7 C)  SpO2: 95% 94%   Skin clean, dry and intact without evidence of skin break  down, no evidence of skin tears noted. IV catheter discontinued intact. Site without signs and symptoms of complications - no redness or edema noted at insertion site, patient denies c/o pain - only slight tenderness at site.  Dressing with slight pressure applied.  D/c Instructions-Education: Discharge instructions placed in patient packet for receiving facility and gone over with patient with verbalized understanding. D/c education completed with patient/family including follow up instructions, medication list, d/c activities limitations if indicated, with other d/c instructions as indicated by MD - patient able to verbalize understanding, all questions fully answered. Patient instructed to return to ED, call 911, or call MD for any changes in condition.  Patient escorted via stretcher by EMS for discharge to [facility].  Tsosie Billing, LPN 11/30/5168 0:17 PM

## 2022-05-03 DIAGNOSIS — Z299 Encounter for prophylactic measures, unspecified: Secondary | ICD-10-CM | POA: Diagnosis not present

## 2022-05-03 DIAGNOSIS — M4850XA Collapsed vertebra, not elsewhere classified, site unspecified, initial encounter for fracture: Secondary | ICD-10-CM | POA: Diagnosis not present

## 2022-05-03 DIAGNOSIS — I700 Atherosclerosis of aorta: Secondary | ICD-10-CM | POA: Diagnosis not present

## 2022-05-03 DIAGNOSIS — I25119 Atherosclerotic heart disease of native coronary artery with unspecified angina pectoris: Secondary | ICD-10-CM | POA: Diagnosis not present

## 2022-05-03 DIAGNOSIS — I10 Essential (primary) hypertension: Secondary | ICD-10-CM | POA: Diagnosis not present

## 2022-05-05 DIAGNOSIS — I10 Essential (primary) hypertension: Secondary | ICD-10-CM | POA: Diagnosis not present

## 2022-05-05 DIAGNOSIS — F23 Brief psychotic disorder: Secondary | ICD-10-CM | POA: Diagnosis not present

## 2022-05-05 DIAGNOSIS — Z299 Encounter for prophylactic measures, unspecified: Secondary | ICD-10-CM | POA: Diagnosis not present

## 2022-05-05 DIAGNOSIS — E1165 Type 2 diabetes mellitus with hyperglycemia: Secondary | ICD-10-CM | POA: Diagnosis not present

## 2022-05-10 DIAGNOSIS — I10 Essential (primary) hypertension: Secondary | ICD-10-CM | POA: Diagnosis not present

## 2022-05-10 DIAGNOSIS — Z299 Encounter for prophylactic measures, unspecified: Secondary | ICD-10-CM | POA: Diagnosis not present

## 2022-05-10 DIAGNOSIS — K7460 Unspecified cirrhosis of liver: Secondary | ICD-10-CM | POA: Diagnosis not present

## 2022-05-11 DIAGNOSIS — F411 Generalized anxiety disorder: Secondary | ICD-10-CM | POA: Diagnosis not present

## 2022-05-11 DIAGNOSIS — F22 Delusional disorders: Secondary | ICD-10-CM | POA: Diagnosis not present

## 2022-05-11 DIAGNOSIS — F03B2 Unspecified dementia, moderate, with psychotic disturbance: Secondary | ICD-10-CM | POA: Diagnosis not present

## 2022-05-17 DIAGNOSIS — Z299 Encounter for prophylactic measures, unspecified: Secondary | ICD-10-CM | POA: Diagnosis not present

## 2022-05-17 DIAGNOSIS — I10 Essential (primary) hypertension: Secondary | ICD-10-CM | POA: Diagnosis not present

## 2022-05-17 DIAGNOSIS — S32020A Wedge compression fracture of second lumbar vertebra, initial encounter for closed fracture: Secondary | ICD-10-CM | POA: Diagnosis not present

## 2022-05-17 DIAGNOSIS — I25119 Atherosclerotic heart disease of native coronary artery with unspecified angina pectoris: Secondary | ICD-10-CM | POA: Diagnosis not present

## 2022-05-19 ENCOUNTER — Ambulatory Visit (INDEPENDENT_AMBULATORY_CARE_PROVIDER_SITE_OTHER): Payer: PPO | Admitting: Gastroenterology

## 2022-05-20 DIAGNOSIS — N390 Urinary tract infection, site not specified: Secondary | ICD-10-CM | POA: Diagnosis not present

## 2022-05-24 DIAGNOSIS — I10 Essential (primary) hypertension: Secondary | ICD-10-CM | POA: Diagnosis not present

## 2022-05-24 DIAGNOSIS — R410 Disorientation, unspecified: Secondary | ICD-10-CM | POA: Diagnosis not present

## 2022-05-24 DIAGNOSIS — Z299 Encounter for prophylactic measures, unspecified: Secondary | ICD-10-CM | POA: Diagnosis not present

## 2022-05-24 DIAGNOSIS — R3911 Hesitancy of micturition: Secondary | ICD-10-CM | POA: Diagnosis not present

## 2022-05-24 DIAGNOSIS — Z09 Encounter for follow-up examination after completed treatment for conditions other than malignant neoplasm: Secondary | ICD-10-CM | POA: Diagnosis not present

## 2022-05-25 DIAGNOSIS — Z299 Encounter for prophylactic measures, unspecified: Secondary | ICD-10-CM | POA: Diagnosis not present

## 2022-05-25 DIAGNOSIS — R5383 Other fatigue: Secondary | ICD-10-CM | POA: Diagnosis not present

## 2022-05-25 DIAGNOSIS — I10 Essential (primary) hypertension: Secondary | ICD-10-CM | POA: Diagnosis not present

## 2022-06-02 DIAGNOSIS — Z09 Encounter for follow-up examination after completed treatment for conditions other than malignant neoplasm: Secondary | ICD-10-CM | POA: Diagnosis not present

## 2022-06-02 DIAGNOSIS — Z87440 Personal history of urinary (tract) infections: Secondary | ICD-10-CM | POA: Diagnosis not present

## 2022-06-03 DIAGNOSIS — R5383 Other fatigue: Secondary | ICD-10-CM | POA: Diagnosis not present

## 2022-06-03 DIAGNOSIS — R4182 Altered mental status, unspecified: Secondary | ICD-10-CM | POA: Diagnosis not present

## 2022-06-13 DIAGNOSIS — J9611 Chronic respiratory failure with hypoxia: Secondary | ICD-10-CM | POA: Diagnosis not present

## 2022-06-13 DIAGNOSIS — Z299 Encounter for prophylactic measures, unspecified: Secondary | ICD-10-CM | POA: Diagnosis not present

## 2022-06-13 DIAGNOSIS — J069 Acute upper respiratory infection, unspecified: Secondary | ICD-10-CM | POA: Diagnosis not present

## 2022-06-13 DIAGNOSIS — I700 Atherosclerosis of aorta: Secondary | ICD-10-CM | POA: Diagnosis not present

## 2022-06-13 DIAGNOSIS — I10 Essential (primary) hypertension: Secondary | ICD-10-CM | POA: Diagnosis not present

## 2022-06-30 DIAGNOSIS — I700 Atherosclerosis of aorta: Secondary | ICD-10-CM | POA: Diagnosis not present

## 2022-06-30 DIAGNOSIS — I10 Essential (primary) hypertension: Secondary | ICD-10-CM | POA: Diagnosis not present

## 2022-06-30 DIAGNOSIS — J449 Chronic obstructive pulmonary disease, unspecified: Secondary | ICD-10-CM | POA: Diagnosis not present

## 2022-06-30 DIAGNOSIS — Z299 Encounter for prophylactic measures, unspecified: Secondary | ICD-10-CM | POA: Diagnosis not present

## 2022-06-30 DIAGNOSIS — J9611 Chronic respiratory failure with hypoxia: Secondary | ICD-10-CM | POA: Diagnosis not present

## 2022-07-01 ENCOUNTER — Other Ambulatory Visit: Payer: Self-pay | Admitting: Student

## 2022-07-01 DIAGNOSIS — G8929 Other chronic pain: Secondary | ICD-10-CM

## 2022-07-08 ENCOUNTER — Ambulatory Visit
Admission: RE | Admit: 2022-07-08 | Discharge: 2022-07-08 | Disposition: A | Discharge status: Home / Self Care | Payer: PPO | Source: Ambulatory Visit | Attending: Student | Admitting: Student

## 2022-07-08 DIAGNOSIS — G8929 Other chronic pain: Secondary | ICD-10-CM

## 2022-07-08 DIAGNOSIS — M4727 Other spondylosis with radiculopathy, lumbosacral region: Secondary | ICD-10-CM | POA: Diagnosis not present

## 2022-07-08 DIAGNOSIS — M545 Low back pain, unspecified: Secondary | ICD-10-CM

## 2022-07-08 MED ORDER — IOPAMIDOL (ISOVUE-M 200) INJECTION 41%
1.0000 mL | Freq: Once | INTRAMUSCULAR | Status: AC
  Administered 2022-07-08: 1 mL via EPIDURAL

## 2022-07-08 MED ORDER — METHYLPREDNISOLONE ACETATE 40 MG/ML INJ SUSP (RADIOLOG
80.0000 mg | Freq: Once | INTRAMUSCULAR | Status: AC
  Administered 2022-07-08: 80 mg via EPIDURAL

## 2022-07-08 NOTE — Discharge Instructions (Signed)
Post Procedure Spinal Discharge Instruction Sheet  You may resume a regular diet and any medications that you routinely take (including pain medications) unless otherwise noted by MD.  No driving day of procedure.  Light activity throughout the rest of the day.  Do not do any strenuous work, exercise, bending or lifting.  The day following the procedure, you can resume normal physical activity but you should refrain from exercising or physical therapy for at least three days thereafter.  You may apply ice to the injection site, 20 minutes on, 20 minutes off, as needed. Do not apply ice directly to skin.    Common Side Effects:  Headaches- take your usual medications as directed by your physician.  Increase your fluid intake.  Caffeinated beverages may be helpful.  Lie flat in bed until your headache resolves.  Restlessness or inability to sleep- you may have trouble sleeping for the next few days.  Ask your referring physician if you need any medication for sleep.  Facial flushing or redness- should subside within a few days.  Increased pain- a temporary increase in pain a day or two following your procedure is not unusual.  Take your pain medication as prescribed by your referring physician.  Leg cramps  Please contact our office at [phone] for the following symptoms: Fever greater than 100 degrees. Headaches unresolved with medication after 2-3 days. Increased swelling, pain, or redness at injection site.   Thank you for visiting [facility] today.   May resume aspirin after procedure!

## 2022-07-21 DIAGNOSIS — K219 Gastro-esophageal reflux disease without esophagitis: Secondary | ICD-10-CM | POA: Diagnosis not present

## 2022-07-21 DIAGNOSIS — I2510 Atherosclerotic heart disease of native coronary artery without angina pectoris: Secondary | ICD-10-CM | POA: Diagnosis not present

## 2022-07-24 DIAGNOSIS — R3915 Urgency of urination: Secondary | ICD-10-CM | POA: Diagnosis not present

## 2022-07-24 DIAGNOSIS — K5900 Constipation, unspecified: Secondary | ICD-10-CM | POA: Diagnosis not present

## 2022-07-26 DIAGNOSIS — Z87448 Personal history of other diseases of urinary system: Secondary | ICD-10-CM | POA: Diagnosis not present

## 2022-07-26 DIAGNOSIS — Z09 Encounter for follow-up examination after completed treatment for conditions other than malignant neoplasm: Secondary | ICD-10-CM | POA: Diagnosis not present

## 2022-07-26 DIAGNOSIS — Z8719 Personal history of other diseases of the digestive system: Secondary | ICD-10-CM | POA: Diagnosis not present

## 2022-08-05 DIAGNOSIS — F419 Anxiety disorder, unspecified: Secondary | ICD-10-CM | POA: Diagnosis not present

## 2022-08-05 DIAGNOSIS — K760 Fatty (change of) liver, not elsewhere classified: Secondary | ICD-10-CM | POA: Diagnosis not present

## 2022-09-03 ENCOUNTER — Encounter (INDEPENDENT_AMBULATORY_CARE_PROVIDER_SITE_OTHER): Payer: Self-pay | Admitting: Gastroenterology

## 2022-09-09 DIAGNOSIS — F411 Generalized anxiety disorder: Secondary | ICD-10-CM | POA: Diagnosis not present

## 2022-09-09 DIAGNOSIS — M25561 Pain in right knee: Secondary | ICD-10-CM | POA: Diagnosis not present

## 2022-09-09 DIAGNOSIS — I10 Essential (primary) hypertension: Secondary | ICD-10-CM | POA: Diagnosis not present

## 2022-09-09 DIAGNOSIS — D692 Other nonthrombocytopenic purpura: Secondary | ICD-10-CM | POA: Diagnosis not present

## 2022-09-14 DIAGNOSIS — I10 Essential (primary) hypertension: Secondary | ICD-10-CM | POA: Diagnosis not present

## 2022-09-14 DIAGNOSIS — H6121 Impacted cerumen, right ear: Secondary | ICD-10-CM | POA: Diagnosis not present

## 2022-09-14 DIAGNOSIS — L821 Other seborrheic keratosis: Secondary | ICD-10-CM | POA: Diagnosis not present

## 2022-09-14 DIAGNOSIS — Z299 Encounter for prophylactic measures, unspecified: Secondary | ICD-10-CM | POA: Diagnosis not present

## 2022-09-14 DIAGNOSIS — M25561 Pain in right knee: Secondary | ICD-10-CM | POA: Diagnosis not present

## 2022-11-01 DIAGNOSIS — M545 Low back pain, unspecified: Secondary | ICD-10-CM | POA: Diagnosis not present

## 2022-11-01 DIAGNOSIS — R5383 Other fatigue: Secondary | ICD-10-CM | POA: Diagnosis not present

## 2022-11-01 DIAGNOSIS — M179 Osteoarthritis of knee, unspecified: Secondary | ICD-10-CM | POA: Diagnosis not present

## 2022-11-04 DIAGNOSIS — I509 Heart failure, unspecified: Secondary | ICD-10-CM | POA: Diagnosis not present

## 2022-11-04 DIAGNOSIS — Z7951 Long term (current) use of inhaled steroids: Secondary | ICD-10-CM | POA: Diagnosis not present

## 2022-11-04 DIAGNOSIS — D692 Other nonthrombocytopenic purpura: Secondary | ICD-10-CM | POA: Diagnosis not present

## 2022-11-04 DIAGNOSIS — Z6827 Body mass index (BMI) 27.0-27.9, adult: Secondary | ICD-10-CM | POA: Diagnosis not present

## 2022-11-04 DIAGNOSIS — J449 Chronic obstructive pulmonary disease, unspecified: Secondary | ICD-10-CM | POA: Diagnosis not present

## 2022-11-04 DIAGNOSIS — I25118 Atherosclerotic heart disease of native coronary artery with other forms of angina pectoris: Secondary | ICD-10-CM | POA: Diagnosis not present

## 2022-11-08 DIAGNOSIS — Z299 Encounter for prophylactic measures, unspecified: Secondary | ICD-10-CM | POA: Diagnosis not present

## 2022-11-08 DIAGNOSIS — I152 Hypertension secondary to endocrine disorders: Secondary | ICD-10-CM | POA: Diagnosis not present

## 2022-11-08 DIAGNOSIS — Z0000 Encounter for general adult medical examination without abnormal findings: Secondary | ICD-10-CM | POA: Diagnosis not present

## 2022-11-08 DIAGNOSIS — Z1339 Encounter for screening examination for other mental health and behavioral disorders: Secondary | ICD-10-CM | POA: Diagnosis not present

## 2022-11-08 DIAGNOSIS — Z1331 Encounter for screening for depression: Secondary | ICD-10-CM | POA: Diagnosis not present

## 2022-11-08 DIAGNOSIS — I10 Essential (primary) hypertension: Secondary | ICD-10-CM | POA: Diagnosis not present

## 2022-11-08 DIAGNOSIS — E1159 Type 2 diabetes mellitus with other circulatory complications: Secondary | ICD-10-CM | POA: Diagnosis not present

## 2022-11-08 DIAGNOSIS — Z7189 Other specified counseling: Secondary | ICD-10-CM | POA: Diagnosis not present

## 2022-11-11 DIAGNOSIS — I10 Essential (primary) hypertension: Secondary | ICD-10-CM | POA: Diagnosis not present

## 2022-11-11 DIAGNOSIS — Z299 Encounter for prophylactic measures, unspecified: Secondary | ICD-10-CM | POA: Diagnosis not present

## 2022-11-11 DIAGNOSIS — M25561 Pain in right knee: Secondary | ICD-10-CM | POA: Diagnosis not present

## 2023-01-09 DIAGNOSIS — Z79899 Other long term (current) drug therapy: Secondary | ICD-10-CM | POA: Diagnosis not present

## 2023-01-09 DIAGNOSIS — M549 Dorsalgia, unspecified: Secondary | ICD-10-CM | POA: Diagnosis not present

## 2023-01-09 DIAGNOSIS — J9611 Chronic respiratory failure with hypoxia: Secondary | ICD-10-CM | POA: Diagnosis not present

## 2023-01-09 DIAGNOSIS — F419 Anxiety disorder, unspecified: Secondary | ICD-10-CM | POA: Diagnosis not present

## 2023-01-09 DIAGNOSIS — F1320 Sedative, hypnotic or anxiolytic dependence, uncomplicated: Secondary | ICD-10-CM | POA: Diagnosis not present

## 2023-01-26 DIAGNOSIS — I10 Essential (primary) hypertension: Secondary | ICD-10-CM | POA: Diagnosis not present

## 2023-01-26 DIAGNOSIS — Z299 Encounter for prophylactic measures, unspecified: Secondary | ICD-10-CM | POA: Diagnosis not present

## 2023-01-26 DIAGNOSIS — J449 Chronic obstructive pulmonary disease, unspecified: Secondary | ICD-10-CM | POA: Diagnosis not present

## 2023-01-26 DIAGNOSIS — R410 Disorientation, unspecified: Secondary | ICD-10-CM | POA: Diagnosis not present

## 2023-01-26 DIAGNOSIS — N390 Urinary tract infection, site not specified: Secondary | ICD-10-CM | POA: Diagnosis not present

## 2023-02-09 DIAGNOSIS — I10 Essential (primary) hypertension: Secondary | ICD-10-CM | POA: Diagnosis not present

## 2023-02-09 DIAGNOSIS — Z79899 Other long term (current) drug therapy: Secondary | ICD-10-CM | POA: Diagnosis not present

## 2023-02-09 DIAGNOSIS — Z6831 Body mass index (BMI) 31.0-31.9, adult: Secondary | ICD-10-CM | POA: Diagnosis not present

## 2023-02-09 DIAGNOSIS — E7800 Pure hypercholesterolemia, unspecified: Secondary | ICD-10-CM | POA: Diagnosis not present

## 2023-02-09 DIAGNOSIS — R5383 Other fatigue: Secondary | ICD-10-CM | POA: Diagnosis not present

## 2023-02-09 DIAGNOSIS — Z299 Encounter for prophylactic measures, unspecified: Secondary | ICD-10-CM | POA: Diagnosis not present

## 2023-02-09 DIAGNOSIS — Z0000 Encounter for general adult medical examination without abnormal findings: Secondary | ICD-10-CM | POA: Diagnosis not present

## 2023-02-13 DIAGNOSIS — E7800 Pure hypercholesterolemia, unspecified: Secondary | ICD-10-CM | POA: Diagnosis not present

## 2023-02-13 DIAGNOSIS — R52 Pain, unspecified: Secondary | ICD-10-CM | POA: Diagnosis not present

## 2023-02-13 DIAGNOSIS — I10 Essential (primary) hypertension: Secondary | ICD-10-CM | POA: Diagnosis not present

## 2023-02-13 DIAGNOSIS — Z299 Encounter for prophylactic measures, unspecified: Secondary | ICD-10-CM | POA: Diagnosis not present

## 2023-02-13 DIAGNOSIS — M25561 Pain in right knee: Secondary | ICD-10-CM | POA: Diagnosis not present

## 2023-02-21 DIAGNOSIS — J449 Chronic obstructive pulmonary disease, unspecified: Secondary | ICD-10-CM | POA: Diagnosis not present

## 2023-02-21 DIAGNOSIS — I25119 Atherosclerotic heart disease of native coronary artery with unspecified angina pectoris: Secondary | ICD-10-CM | POA: Diagnosis not present

## 2023-02-21 DIAGNOSIS — I700 Atherosclerosis of aorta: Secondary | ICD-10-CM | POA: Diagnosis not present

## 2023-02-21 DIAGNOSIS — J9611 Chronic respiratory failure with hypoxia: Secondary | ICD-10-CM | POA: Diagnosis not present

## 2023-02-24 ENCOUNTER — Other Ambulatory Visit (HOSPITAL_COMMUNITY): Payer: Self-pay | Admitting: Student

## 2023-02-24 DIAGNOSIS — Z20822 Contact with and (suspected) exposure to covid-19: Secondary | ICD-10-CM | POA: Diagnosis not present

## 2023-02-24 DIAGNOSIS — Z7951 Long term (current) use of inhaled steroids: Secondary | ICD-10-CM | POA: Diagnosis not present

## 2023-02-24 DIAGNOSIS — K7581 Nonalcoholic steatohepatitis (NASH): Secondary | ICD-10-CM | POA: Diagnosis not present

## 2023-02-24 DIAGNOSIS — R54 Age-related physical debility: Secondary | ICD-10-CM | POA: Diagnosis not present

## 2023-02-24 DIAGNOSIS — R918 Other nonspecific abnormal finding of lung field: Secondary | ICD-10-CM | POA: Diagnosis not present

## 2023-02-24 DIAGNOSIS — J189 Pneumonia, unspecified organism: Secondary | ICD-10-CM | POA: Diagnosis not present

## 2023-02-24 DIAGNOSIS — J441 Chronic obstructive pulmonary disease with (acute) exacerbation: Secondary | ICD-10-CM | POA: Diagnosis not present

## 2023-02-24 DIAGNOSIS — J069 Acute upper respiratory infection, unspecified: Secondary | ICD-10-CM | POA: Diagnosis not present

## 2023-02-24 DIAGNOSIS — R0902 Hypoxemia: Secondary | ICD-10-CM | POA: Diagnosis not present

## 2023-02-24 DIAGNOSIS — J440 Chronic obstructive pulmonary disease with acute lower respiratory infection: Secondary | ICD-10-CM | POA: Diagnosis not present

## 2023-02-24 DIAGNOSIS — R059 Cough, unspecified: Secondary | ICD-10-CM

## 2023-02-24 DIAGNOSIS — J9601 Acute respiratory failure with hypoxia: Secondary | ICD-10-CM | POA: Diagnosis not present

## 2023-02-24 DIAGNOSIS — Z79899 Other long term (current) drug therapy: Secondary | ICD-10-CM | POA: Diagnosis not present

## 2023-02-24 DIAGNOSIS — I10 Essential (primary) hypertension: Secondary | ICD-10-CM | POA: Diagnosis not present

## 2023-02-24 DIAGNOSIS — Z9981 Dependence on supplemental oxygen: Secondary | ICD-10-CM | POA: Diagnosis not present

## 2023-02-24 DIAGNOSIS — Z7982 Long term (current) use of aspirin: Secondary | ICD-10-CM | POA: Diagnosis not present

## 2023-02-24 DIAGNOSIS — F419 Anxiety disorder, unspecified: Secondary | ICD-10-CM | POA: Diagnosis not present

## 2023-02-24 DIAGNOSIS — E785 Hyperlipidemia, unspecified: Secondary | ICD-10-CM | POA: Diagnosis not present

## 2023-02-24 DIAGNOSIS — M1990 Unspecified osteoarthritis, unspecified site: Secondary | ICD-10-CM | POA: Diagnosis not present

## 2023-02-24 DIAGNOSIS — Z1152 Encounter for screening for COVID-19: Secondary | ICD-10-CM | POA: Diagnosis not present

## 2023-02-24 DIAGNOSIS — Z792 Long term (current) use of antibiotics: Secondary | ICD-10-CM | POA: Diagnosis not present

## 2023-02-24 DIAGNOSIS — K7460 Unspecified cirrhosis of liver: Secondary | ICD-10-CM | POA: Diagnosis not present

## 2023-02-24 DIAGNOSIS — K7469 Other cirrhosis of liver: Secondary | ICD-10-CM | POA: Diagnosis not present

## 2023-03-06 DIAGNOSIS — K5900 Constipation, unspecified: Secondary | ICD-10-CM | POA: Diagnosis not present

## 2023-03-06 DIAGNOSIS — K7460 Unspecified cirrhosis of liver: Secondary | ICD-10-CM | POA: Diagnosis not present

## 2023-03-06 DIAGNOSIS — I10 Essential (primary) hypertension: Secondary | ICD-10-CM | POA: Diagnosis not present

## 2023-03-06 DIAGNOSIS — J449 Chronic obstructive pulmonary disease, unspecified: Secondary | ICD-10-CM | POA: Diagnosis not present

## 2023-03-06 DIAGNOSIS — Z09 Encounter for follow-up examination after completed treatment for conditions other than malignant neoplasm: Secondary | ICD-10-CM | POA: Diagnosis not present

## 2023-03-07 DIAGNOSIS — R300 Dysuria: Secondary | ICD-10-CM | POA: Diagnosis not present

## 2023-03-07 DIAGNOSIS — N390 Urinary tract infection, site not specified: Secondary | ICD-10-CM | POA: Diagnosis not present

## 2023-03-15 DIAGNOSIS — F03918 Unspecified dementia, unspecified severity, with other behavioral disturbance: Secondary | ICD-10-CM | POA: Diagnosis not present

## 2023-03-15 DIAGNOSIS — I25119 Atherosclerotic heart disease of native coronary artery with unspecified angina pectoris: Secondary | ICD-10-CM | POA: Diagnosis not present

## 2023-03-15 DIAGNOSIS — J189 Pneumonia, unspecified organism: Secondary | ICD-10-CM | POA: Diagnosis not present

## 2023-03-15 DIAGNOSIS — K7460 Unspecified cirrhosis of liver: Secondary | ICD-10-CM | POA: Diagnosis not present

## 2023-03-15 DIAGNOSIS — J449 Chronic obstructive pulmonary disease, unspecified: Secondary | ICD-10-CM | POA: Diagnosis not present

## 2023-03-15 DIAGNOSIS — E1159 Type 2 diabetes mellitus with other circulatory complications: Secondary | ICD-10-CM | POA: Diagnosis not present

## 2023-03-15 DIAGNOSIS — I119 Hypertensive heart disease without heart failure: Secondary | ICD-10-CM | POA: Diagnosis not present

## 2023-03-15 DIAGNOSIS — Z9989 Dependence on other enabling machines and devices: Secondary | ICD-10-CM | POA: Diagnosis not present

## 2023-03-15 DIAGNOSIS — I700 Atherosclerosis of aorta: Secondary | ICD-10-CM | POA: Diagnosis not present

## 2023-03-17 DIAGNOSIS — J449 Chronic obstructive pulmonary disease, unspecified: Secondary | ICD-10-CM | POA: Diagnosis not present

## 2023-03-17 DIAGNOSIS — E1159 Type 2 diabetes mellitus with other circulatory complications: Secondary | ICD-10-CM | POA: Diagnosis not present

## 2023-03-17 DIAGNOSIS — I25119 Atherosclerotic heart disease of native coronary artery with unspecified angina pectoris: Secondary | ICD-10-CM | POA: Diagnosis not present

## 2023-03-17 DIAGNOSIS — J189 Pneumonia, unspecified organism: Secondary | ICD-10-CM | POA: Diagnosis not present

## 2023-03-23 DIAGNOSIS — R609 Edema, unspecified: Secondary | ICD-10-CM | POA: Diagnosis not present

## 2023-03-23 DIAGNOSIS — L989 Disorder of the skin and subcutaneous tissue, unspecified: Secondary | ICD-10-CM | POA: Diagnosis not present

## 2023-03-24 DIAGNOSIS — I10 Essential (primary) hypertension: Secondary | ICD-10-CM | POA: Diagnosis not present

## 2023-03-24 DIAGNOSIS — I700 Atherosclerosis of aorta: Secondary | ICD-10-CM | POA: Diagnosis not present

## 2023-03-24 DIAGNOSIS — L989 Disorder of the skin and subcutaneous tissue, unspecified: Secondary | ICD-10-CM | POA: Diagnosis not present

## 2023-03-24 DIAGNOSIS — I25119 Atherosclerotic heart disease of native coronary artery with unspecified angina pectoris: Secondary | ICD-10-CM | POA: Diagnosis not present

## 2023-03-24 DIAGNOSIS — Z299 Encounter for prophylactic measures, unspecified: Secondary | ICD-10-CM | POA: Diagnosis not present

## 2023-03-30 DIAGNOSIS — F419 Anxiety disorder, unspecified: Secondary | ICD-10-CM | POA: Diagnosis not present

## 2023-03-30 DIAGNOSIS — M549 Dorsalgia, unspecified: Secondary | ICD-10-CM | POA: Diagnosis not present

## 2023-03-30 DIAGNOSIS — R21 Rash and other nonspecific skin eruption: Secondary | ICD-10-CM | POA: Diagnosis not present

## 2023-04-01 ENCOUNTER — Ambulatory Visit
Admission: EM | Admit: 2023-04-01 | Discharge: 2023-04-01 | Disposition: A | Discharge status: Home / Self Care | Payer: PPO | Attending: Nurse Practitioner | Admitting: Nurse Practitioner

## 2023-04-01 DIAGNOSIS — H6092 Unspecified otitis externa, left ear: Secondary | ICD-10-CM

## 2023-04-01 MED ORDER — OFLOXACIN 0.3 % OT SOLN: 5.0000 [drp] | Freq: Two times a day (BID) | OTIC | 0 refills | Status: AC

## 2023-04-01 NOTE — ED Triage Notes (Signed)
Pt c/o left ear pain , pt states she woke up with ear pain this morning and notified the nurses at the assisted living facility. Says the doctor there told her to some oil in it to see if that helped, but it do not.

## 2023-04-01 NOTE — Discharge Instructions (Signed)
Apply eardrops as prescribed. May take Tylenol pain, fever, or general discomfort. Warm compresses to the affected ear help with comfort. Do not stick anything inside the ear while symptoms persist. Avoid getting water inside of the ear while symptoms persist. If symptoms do not improve, please follow-up with your primary care physician or in this clinic for further evaluation. Follow-up as needed.

## 2023-04-01 NOTE — ED Provider Notes (Signed)
[facility]    CSN: [id number] Arrival date & time: 04/01/23  1225      History   Chief Complaint No chief complaint on file.   HPI Heather [name] is a 89 y.o. female.   The history is provided by the patient.   The patient presents with her family for complaints of left ear pain.  Patient states the pain started this morning when she woke up.  Patient states that she has heard a lot of cracking and popping noises in the left ear.  She denies fever, chills, dizziness, ear drainage, decreased hearing, nasal congestion, or runny nose.  Patient states that [facility] and she told the nurse about her symptoms.  She states the nurse did call the doctor and he had her put some drops in the ears.  Patient states that did not help her symptoms.  She has also been applying a warm compress to the left ear for pain.  Past Medical History:  Diagnosis Date   Anxiety    GERD (gastroesophageal reflux disease)    Hx of shoulder surgery    Left Shoulder - pitched nerve.   Other and unspecified hyperlipidemia    Unspecified essential hypertension     Patient Active Problem List   Diagnosis Date Noted   Abnormal CT of the abdomen 04/26/2022   Chronic back pain 04/26/2022   Closed compression fracture of body of L1 vertebra (HCC) 04/25/2022   Pressure injury of skin 04/25/2022   Acute respiratory failure with hypoxia (HCC) 04/25/2022   Low back pain 02/09/2022   Aspiration pneumonia (HCC) 01/27/2022   Elevated troponin 01/27/2022   Sepsis (HCC) 01/27/2022   Multifocal pneumonia 01/27/2022   Liver cirrhosis secondary to NASH (HCC) 11/03/2021   IBS (irritable bowel syndrome) 08/20/2020   Nausea with vomiting 06/18/2020   Anorexia 11/11/2019   Abdominal pain 08/12/2019   Constipation 08/12/2019   Rash and nonspecific skin eruption 08/12/2019   Elevated LFTs 08/12/2019   Carpal tunnel syndrome, right upper limb 08/31/2018   Essential hypertension 06/09/2018    Anxiety 06/09/2018   GERD (gastroesophageal reflux disease) 06/09/2018   Palpitations 04/22/2012   Chest pain 04/22/2012   Coronary artery disease excluded 04/22/2012   Insomnia 04/22/2012    Past Surgical History:  Procedure Laterality Date   BIOPSY  04/09/2020   Procedure: BIOPSY;  Surgeon: Malissa Hippo, MD;  Location: [facility];  Service: Endoscopy;;   CERVICAL DISC SURGERY     CESAREAN SECTION  1970   colonscopy  08/22/2012   moderate diverticulosos in sigmoid and two tubular adenomas   ESOPHAGOGASTRODUODENOSCOPY N/A 04/09/2020   Rehman:Normal hypopharynx.normal esophagus, z line irreg, 38 cm from incisors, erythematous mucosa in antrum and preplyoric region, gastric antral mucosa with non specific reactive gastropathy, normal duodenal bul and second portion of duodenum   History of shoulder surgery Left    pitched nerve.    OB History   No obstetric history on file.      Home Medications    Prior to Admission medications   Medication Sig Start Date End Date Taking? Authorizing Provider  ofloxacin (FLOXIN) 0.3 % OTIC solution Place 5 drops into the left ear 2 (two) times daily for 7 days. 04/01/23 04/08/23 Yes Weslee Fogg-Warren, Sadie Haber, NP  acetaminophen (TYLENOL) 500 MG tablet Take 500 mg by mouth every 4 (four) hours as needed for mild pain, moderate pain or fever.    [provider]  albuterol (VENTOLIN HFA)  108 (90 Base) MCG/ACT inhaler Inhale 2 puffs into the lungs every 4 (four) hours as needed for wheezing or shortness of breath. 01/28/22   Shon Hale, MD  ALPRAZolam Prudy Feeler) 0.5 MG tablet Take 2 tablets (1 mg total) by mouth 3 (three) times daily. 04/28/22   Erick Blinks, MD  amLODipine (NORVASC) 10 MG tablet Take 10 mg by mouth every morning.  07/14/17   [provider]  aspirin 81 MG tablet Take 1 tablet (81 mg total) by mouth daily with breakfast. 01/28/22   Emokpae, Courage, MD  Calcium-Magnesium-Zinc (CAL-MAG-ZINC PO) Take 1 tablet by  mouth daily.    [provider]  Diclofenac Sodium 3 % GEL Apply 1-2 g topically in the morning, at noon, and at bedtime.    [provider]  ferrous sulfate 325 (65 FE) MG tablet Take 325 mg by mouth daily with breakfast.    [provider]  fluticasone furoate-vilanterol (BREO ELLIPTA) 100-25 MCG/ACT AEPB Inhale 1 puff into the lungs daily. 03/04/20   [provider]  furosemide (LASIX) 40 MG tablet Take 40 mg by mouth daily.    [provider]  gabapentin (NEURONTIN) 300 MG capsule Take 300 mg by mouth daily. 01/17/22   [provider]  HYDROcodone-acetaminophen (NORCO/VICODIN) 5-325 MG tablet Take 1 tablet by mouth every 6 (six) hours as needed for moderate pain. 04/28/22   Erick Blinks, MD  isosorbide mononitrate (IMDUR) 60 MG 24 hr tablet Take 60 mg by mouth daily. 01/17/22   [provider]  lactulose (CHRONULAC) 10 GM/15ML solution Take 30 mLs (20 g total) by mouth 2 (two) times daily. Goal is to Have at least 2 mushy/Non-solid bowel movements Each day-- May give 1 additional dose of lactulose if no bowel movement in 24 hr period Patient taking differently: Take 20 g by mouth 2 (two) times daily. 01/28/22   Shon Hale, MD  lidocaine (LIDODERM) 5 % Place 1 patch onto the skin daily. Remove & Discard patch within 12 hours or as directed by MD 04/28/22   Erick Blinks, MD  metoprolol tartrate (LOPRESSOR) 25 MG tablet Take 25 mg by mouth 2 (two) times daily. 07/10/18   [provider]  Naphazoline-Pheniramine (OPCON-A) 0.027-0.315 % SOLN Place 1 drop into both eyes 2 (two) times daily as needed (dry eyes).    [provider]  nitroGLYCERIN (NITROSTAT) 0.4 MG SL tablet Place 0.4 mg under the tongue every 5 (five) minutes as needed for chest pain.    [provider]  omeprazole (PRILOSEC) 40 MG capsule Take 40 mg by mouth daily.    [provider]  polyethylene glycol (MIRALAX / GLYCOLAX) 17 g packet  Take 17 g by mouth daily as needed. 04/28/22   Erick Blinks, MD  potassium chloride (KLOR-CON) 10 MEQ tablet Take 1 tablet (10 mEq total) by mouth daily. Take While taking Lactulose 01/28/22   Shon Hale, MD  QUEtiapine (SEROQUEL) 25 MG tablet Take 0.5 tablets (12.5 mg total) by mouth at bedtime. 04/29/22   Erick Blinks, MD  senna-docusate (SENOKOT-S) 8.6-50 MG tablet Take 1 tablet by mouth 2 (two) times daily. 04/28/22   Erick Blinks, MD  sucralfate (CARAFATE) 1 g tablet Take 1 g by mouth 4 (four) times daily. 01/17/22   [provider]    Family History Family History  Problem Relation Age of Onset   Hypertension Other    Heart attack Sister     Social History Social History   Tobacco Use  Smoking status: Never   Smokeless tobacco: Never  Vaping Use   Vaping Use: Never used  Substance Use Topics   Alcohol use: No    Alcohol/week: 0.0 standard drinks of alcohol   Drug use: No     Allergies   Patient has no known allergies.   Review of Systems Review of Systems Per HPI  Physical Exam Triage Vital Signs ED Triage Vitals  Enc Vitals Group     BP 04/01/23 1228 126/64     Pulse Rate 04/01/23 1228 68     Resp 04/01/23 1228 15     Temp 04/01/23 1228 (!) 97.3 F (36.3 C)     Temp Source 04/01/23 1228 Oral     SpO2 04/01/23 1228 91 %     Weight --      Height --      Head Circumference --      Peak Flow --      Pain Score 04/01/23 1232 10     Pain Loc --      Pain Edu? --      Excl. in GC? --    No data found.  Updated Vital Signs BP 126/64 (BP Location: Right Arm)   Pulse 68   Temp (!) 97.3 F (36.3 C) (Oral)   Resp 15   SpO2 91%   Visual Acuity Right Eye Distance:   Left Eye Distance:   Bilateral Distance:    Right Eye Near:   Left Eye Near:    Bilateral Near:     Physical Exam Vitals and nursing note reviewed.  Constitutional:      General: She is not in acute distress.    Appearance: Normal appearance.  HENT:     Head:  Normocephalic.     Right Ear: Tympanic membrane, ear canal and external ear normal.     Left Ear: Tympanic membrane normal.     Nose: Nose normal.     Mouth/Throat:     Mouth: Mucous membranes are moist.  Eyes:     Extraocular Movements: Extraocular movements intact.     Pupils: Pupils are equal, round, and reactive to light.  Cardiovascular:     Rate and Rhythm: Normal rate and regular rhythm.     Pulses: Normal pulses.     Heart sounds: Normal heart sounds.  Pulmonary:     Effort: Pulmonary effort is normal.     Breath sounds: Normal breath sounds.  Abdominal:     General: Bowel sounds are normal.     Palpations: Abdomen is soft.  Musculoskeletal:     Cervical back: Normal range of motion.  Skin:    General: Skin is warm and dry.  Neurological:     General: No focal deficit present.     Mental Status: She is alert and oriented to person, place, and time.  Psychiatric:        Mood and Affect: Mood normal.        Behavior: Behavior normal.      UC Treatments / Results  Labs (all labs ordered are listed, but only abnormal results are displayed) Labs Reviewed - No data to display  EKG   Radiology No results found.  Procedures Procedures (including critical care time)  Medications Ordered in UC Medications - No data to display  Initial Impression / Assessment and Plan / UC Course  I have reviewed the triage vital signs and the nursing notes.  Pertinent labs & imaging results that were available during  my care of the patient were reviewed by me and considered in my medical decision making (see chart for details).  The patient is well-appearing, she is in no acute distress, vital signs are stable.  Symptoms appear to be consistent with left otitis externa.  Will treat patient with Floxin 0.3% otic solution.  Supportive care recommendations were provided and discussed with the patient and her family to include over-the-counter Tylenol for pain or discomfort,  continuing warm compresses, and avoiding entrance of water into the left ear.  Patient was advised that if symptoms do not improve, recommend that she follow-up with her primary care physician or in this clinic for further evaluation.  Patient is in agreement with this plan of care and verbalizes understanding.  All questions were answered.  Patient stable for discharge.  Final Clinical Impressions(s) / UC Diagnoses   Final diagnoses:  Otitis externa of left ear, unspecified chronicity, unspecified type     Discharge Instructions      Apply eardrops as prescribed. May take Tylenol pain, fever, or general discomfort. Warm compresses to the affected ear help with comfort. Do not stick anything inside the ear while symptoms persist. Avoid getting water inside of the ear while symptoms persist. If symptoms do not improve, please follow-up with your primary care physician or in this clinic for further evaluation. Follow-up as needed.     ED Prescriptions     Medication Sig Dispense Auth. Provider   ofloxacin (FLOXIN) 0.3 % OTIC solution Place 5 drops into the left ear 2 (two) times daily for 7 days. 5 mL Daltin Crist-Warren, Sadie Haber, NP      PDMP not reviewed this encounter.   Abran Cantor, NP 04/01/23 1254

## 2023-04-04 DIAGNOSIS — H6090 Unspecified otitis externa, unspecified ear: Secondary | ICD-10-CM | POA: Diagnosis not present

## 2023-04-04 DIAGNOSIS — B356 Tinea cruris: Secondary | ICD-10-CM | POA: Diagnosis not present

## 2023-04-04 DIAGNOSIS — N764 Abscess of vulva: Secondary | ICD-10-CM | POA: Diagnosis not present

## 2023-04-04 DIAGNOSIS — I10 Essential (primary) hypertension: Secondary | ICD-10-CM | POA: Diagnosis not present

## 2023-04-04 DIAGNOSIS — Z299 Encounter for prophylactic measures, unspecified: Secondary | ICD-10-CM | POA: Diagnosis not present

## 2023-04-09 ENCOUNTER — Encounter (HOSPITAL_COMMUNITY): Payer: Self-pay

## 2023-04-09 ENCOUNTER — Other Ambulatory Visit: Payer: Self-pay

## 2023-04-09 ENCOUNTER — Emergency Department (HOSPITAL_COMMUNITY)
Admission: EM | Admit: 2023-04-09 | Discharge: 2023-04-09 | Disposition: A | Discharge status: Home / Self Care | Payer: PPO | Attending: Emergency Medicine | Admitting: Emergency Medicine

## 2023-04-09 ENCOUNTER — Emergency Department (HOSPITAL_COMMUNITY): Payer: PPO

## 2023-04-09 DIAGNOSIS — F39 Unspecified mood [affective] disorder: Secondary | ICD-10-CM | POA: Insufficient documentation

## 2023-04-09 DIAGNOSIS — R4586 Emotional lability: Secondary | ICD-10-CM

## 2023-04-09 DIAGNOSIS — Z7982 Long term (current) use of aspirin: Secondary | ICD-10-CM | POA: Diagnosis not present

## 2023-04-09 DIAGNOSIS — L0291 Cutaneous abscess, unspecified: Secondary | ICD-10-CM | POA: Insufficient documentation

## 2023-04-09 DIAGNOSIS — R4182 Altered mental status, unspecified: Secondary | ICD-10-CM | POA: Diagnosis not present

## 2023-04-09 DIAGNOSIS — L739 Follicular disorder, unspecified: Secondary | ICD-10-CM | POA: Diagnosis not present

## 2023-04-09 DIAGNOSIS — R531 Weakness: Secondary | ICD-10-CM | POA: Diagnosis not present

## 2023-04-09 DIAGNOSIS — Z79899 Other long term (current) drug therapy: Secondary | ICD-10-CM | POA: Diagnosis not present

## 2023-04-09 DIAGNOSIS — I10 Essential (primary) hypertension: Secondary | ICD-10-CM | POA: Insufficient documentation

## 2023-04-09 DIAGNOSIS — R5381 Other malaise: Secondary | ICD-10-CM | POA: Diagnosis not present

## 2023-04-09 LAB — CBC WITH DIFFERENTIAL/PLATELET
Abs Immature Granulocytes: 0.02 10*3/uL (ref 0.00–0.07)
Basophils Absolute: 0.1 10*3/uL (ref 0.0–0.1)
Basophils Relative: 1 %
Eosinophils Absolute: 0.3 10*3/uL (ref 0.0–0.5)
Eosinophils Relative: 4 %
HCT: 40.6 % (ref 36.0–46.0)
Hemoglobin: 13.9 g/dL (ref 12.0–15.0)
Immature Granulocytes: 0 %
Lymphocytes Relative: 32 %
Lymphs Abs: 2.7 10*3/uL (ref 0.7–4.0)
MCH: 31.6 pg (ref 26.0–34.0)
MCHC: 34.2 g/dL (ref 30.0–36.0)
MCV: 92.3 fL (ref 80.0–100.0)
Monocytes Absolute: 0.8 10*3/uL (ref 0.1–1.0)
Monocytes Relative: 9 %
Neutro Abs: 4.5 10*3/uL (ref 1.7–7.7)
Neutrophils Relative %: 54 %
Platelets: 200 10*3/uL (ref 150–400)
RBC: 4.4 MIL/uL (ref 3.87–5.11)
RDW: 12.8 % (ref 11.5–15.5)
WBC: 8.3 10*3/uL (ref 4.0–10.5)
nRBC: 0 % (ref 0.0–0.2)

## 2023-04-09 LAB — URINALYSIS, ROUTINE W REFLEX MICROSCOPIC
Bilirubin Urine: NEGATIVE
Glucose, UA: NEGATIVE mg/dL
Hgb urine dipstick: NEGATIVE
Ketones, ur: NEGATIVE mg/dL
Leukocytes,Ua: NEGATIVE
Nitrite: NEGATIVE
Protein, ur: NEGATIVE mg/dL
Specific Gravity, Urine: 1.016 (ref 1.005–1.030)
pH: 5 (ref 5.0–8.0)

## 2023-04-09 LAB — COMPREHENSIVE METABOLIC PANEL
ALT: 29 U/L (ref 0–44)
AST: 35 U/L (ref 15–41)
Albumin: 3.5 g/dL (ref 3.5–5.0)
Alkaline Phosphatase: 49 U/L (ref 38–126)
Anion gap: 10 (ref 5–15)
BUN: 16 mg/dL (ref 8–23)
CO2: 28 mmol/L (ref 22–32)
Calcium: 9 mg/dL (ref 8.9–10.3)
Chloride: 101 mmol/L (ref 98–111)
Creatinine, Ser: 0.82 mg/dL (ref 0.44–1.00)
GFR, Estimated: >60 mL/min (ref >60)
Glucose, Bld: 119 mg/dL — ABNORMAL HIGH (ref 70–99)
Potassium: 3.7 mmol/L (ref 3.5–5.1)
Sodium: 139 mmol/L (ref 135–145)
Total Bilirubin: 0.2 mg/dL — ABNORMAL LOW (ref 0.3–1.2)
Total Protein: 6.6 g/dL (ref 6.5–8.1)

## 2023-04-09 LAB — AMMONIA: Ammonia: 12 umol/L (ref 9–35)

## 2023-04-09 MED ORDER — DOXYCYCLINE HYCLATE 100 MG PO CAPS: 100.0000 mg | ORAL_CAPSULE | Freq: Two times a day (BID) | ORAL | 0 refills | Status: DC

## 2023-04-09 NOTE — Discharge Instructions (Signed)
You were seen in the ER for evaluation of your mood swings. Please follow up with your primary care provider. For your wound on your vagina, please clean daily with dial soap and water. I am prescribing you an antibiotic for you to take daily for the next 7 days. If you have any concerns, new or worsening symptoms, please return to the nearest ER for re-evaluation.   Contact a health care provider if you: Were given medicine and it does not seem to be helping. Feel hopeless and overwhelmed. Feel like you cannot leave your house. Have trouble taking care of yourself. Get help right away if you: Have serious thoughts about hurting yourself or others. If you ever feel like you may hurt yourself or others, or have thoughts about taking your own life, get help right away. Go to your nearest emergency department or: Call your local emergency services (911 in the [place] Call a suicide crisis helpline, such as the National Suicide Prevention Lifeline at (203)612-8605 or 988 in the [place] This is open 24 hours a day in the [place] Text the Crisis Text Line at 6191321742 (in the [place]

## 2023-04-09 NOTE — ED Provider Notes (Signed)
[place] EMERGENCY DEPARTMENT AT [facility] Provider Note   CSN: [id number] Arrival date & time: 04/09/23  1334     History Chief Complaint  Patient presents with   Altered Mental Status   Abscess         Heather [name] is a 89 y.o. female with history of anxiety, GERD, hyperlipidemia, hypertension, cirrhosis presents the emergency room today for evaluation of potential altered mental status.  The patient adamantly denies that she is altered.  She reports "they think I have lost my mind like I did the last time when I was seeing thins, but I have not.  I am packing at my picture frames so that I can leave my apartment and moved back into my house.  My son did not tell me that I was can have to stay at that nursing home forever".  She does mention that she had an abscess that already leaked on her mons pubis area and wanted it looked at as well.  She denies any other symptoms such as chest pain, shortness of breath, abdominal pain, fever, cough or cold symptoms, dysuria.   Altered Mental Status Presenting symptoms: no confusion   Associated symptoms: no abdominal pain, no fever, no headaches, no nausea and no vomiting   Abscess Associated symptoms: no fever, no headaches, no nausea and no vomiting        Home Medications Prior to Admission medications   Medication Sig Start Date End Date Taking? Authorizing Provider  doxycycline (VIBRAMYCIN) 100 MG capsule Take 1 capsule (100 mg total) by mouth 2 (two) times daily. 04/09/23  Yes Achille Rich, PA-C  acetaminophen (TYLENOL) 500 MG tablet Take 500 mg by mouth every 4 (four) hours as needed for mild pain, moderate pain or fever.    [provider]  albuterol (VENTOLIN HFA) 108 (90 Base) MCG/ACT inhaler Inhale 2 puffs into the lungs every 4 (four) hours as needed for wheezing or shortness of breath. 01/28/22   Shon Hale, MD  ALPRAZolam Prudy Feeler) 0.5 MG tablet Take 2 tablets (1 mg total) by mouth 3 (three)  times daily. 04/28/22   Erick Blinks, MD  amLODipine (NORVASC) 10 MG tablet Take 10 mg by mouth every morning.  07/14/17   [provider]  aspirin 81 MG tablet Take 1 tablet (81 mg total) by mouth daily with breakfast. 01/28/22   Emokpae, Courage, MD  Calcium-Magnesium-Zinc (CAL-MAG-ZINC PO) Take 1 tablet by mouth daily.    [provider]  Diclofenac Sodium 3 % GEL Apply 1-2 g topically in the morning, at noon, and at bedtime.    [provider]  ferrous sulfate 325 (65 FE) MG tablet Take 325 mg by mouth daily with breakfast.    [provider]  fluticasone furoate-vilanterol (BREO ELLIPTA) 100-25 MCG/ACT AEPB Inhale 1 puff into the lungs daily. 03/04/20   [provider]  furosemide (LASIX) 40 MG tablet Take 40 mg by mouth daily.    [provider]  gabapentin (NEURONTIN) 300 MG capsule Take 300 mg by mouth daily. 01/17/22   [provider]  HYDROcodone-acetaminophen (NORCO/VICODIN) 5-325 MG tablet Take 1 tablet by mouth every 6 (six) hours as needed for moderate pain. 04/28/22   Erick Blinks, MD  isosorbide mononitrate (IMDUR) 60 MG 24 hr tablet Take 60 mg by mouth daily. 01/17/22   [provider]  lactulose (CHRONULAC) 10 GM/15ML solution Take 30 mLs (20 g total) by mouth 2 (two) times daily. Goal is to Have at  least 2 mushy/Non-solid bowel movements Each day-- May give 1 additional dose of lactulose if no bowel movement in 24 hr period Patient taking differently: Take 20 g by mouth 2 (two) times daily. 01/28/22   Shon Hale, MD  lidocaine (LIDODERM) 5 % Place 1 patch onto the skin daily. Remove & Discard patch within 12 hours or as directed by MD 04/28/22   Erick Blinks, MD  metoprolol tartrate (LOPRESSOR) 25 MG tablet Take 25 mg by mouth 2 (two) times daily. 07/10/18   [provider]  Naphazoline-Pheniramine (OPCON-A) 0.027-0.315 % SOLN Place 1 drop into both eyes 2 (two) times daily as needed (dry eyes).     [provider]  nitroGLYCERIN (NITROSTAT) 0.4 MG SL tablet Place 0.4 mg under the tongue every 5 (five) minutes as needed for chest pain.    [provider]  omeprazole (PRILOSEC) 40 MG capsule Take 40 mg by mouth daily.    [provider]  polyethylene glycol (MIRALAX / GLYCOLAX) 17 g packet Take 17 g by mouth daily as needed. 04/28/22   Erick Blinks, MD  potassium chloride (KLOR-CON) 10 MEQ tablet Take 1 tablet (10 mEq total) by mouth daily. Take While taking Lactulose 01/28/22   Shon Hale, MD  QUEtiapine (SEROQUEL) 25 MG tablet Take 0.5 tablets (12.5 mg total) by mouth at bedtime. 04/29/22   Erick Blinks, MD  senna-docusate (SENOKOT-S) 8.6-50 MG tablet Take 1 tablet by mouth 2 (two) times daily. 04/28/22   Erick Blinks, MD  sucralfate (CARAFATE) 1 g tablet Take 1 g by mouth 4 (four) times daily. 01/17/22   [provider]      Allergies    Patient has no known allergies.    Review of Systems   Review of Systems  Constitutional:  Negative for chills and fever.  Respiratory:  Negative for cough and shortness of breath.   Cardiovascular:  Negative for chest pain.  Gastrointestinal:  Negative for abdominal pain, constipation, diarrhea, nausea and vomiting.  Genitourinary:  Negative for dysuria and hematuria.  Neurological:  Negative for headaches.  Psychiatric/Behavioral:  Negative for confusion.     Physical Exam Updated Vital Signs BP 136/72 (BP Location: Right Arm)   Pulse 72   Temp 98.1 F (36.7 C) (Oral)   Resp 16   Ht 5\' 7"  (1.702 m)   Wt 78.9 kg   SpO2 98%   BMI 27.25 kg/m  Physical Exam Vitals and nursing note reviewed. Exam conducted with a chaperone present ([place], Charity fundraiser).  Constitutional:      General: She is not in acute distress.    Appearance: She is not toxic-appearing.     Comments: Alert and oriented x 4.  Appropriately conversational.  HENT:     Head: Normocephalic and atraumatic.     Mouth/Throat:     Mouth:  Mucous membranes are moist.  Eyes:     General: No scleral icterus.    Extraocular Movements: Extraocular movements intact.     Pupils: Pupils are equal, round, and reactive to light.  Cardiovascular:     Rate and Rhythm: Normal rate.  Pulmonary:     Effort: Pulmonary effort is normal. No respiratory distress.  Abdominal:     Tenderness: There is no abdominal tenderness. There is no guarding or rebound.  Genitourinary:    Comments: Intertrigo present in the bilateral inguinal creases.  She has a small ulcer, smaller than dime size, on the mons pubis more to the left side.  There is already some  granulous tissue surrounding it.  Does still have a small wet wound bed.  No red streaking or surrounding erythema.  No induration or fluctuance appreciated. Musculoskeletal:     Cervical back: Normal range of motion. No rigidity.     Right lower leg: No edema.     Left lower leg: No edema.  Skin:    General: Skin is warm and dry.  Neurological:     General: No focal deficit present.     Mental Status: She is alert and oriented to person, place, and time.     GCS: GCS eye subscore is 4. GCS verbal subscore is 5. GCS motor subscore is 6.     Cranial Nerves: No cranial nerve deficit.     Sensory: No sensory deficit.     Motor: No weakness.     ED Results / Procedures / Treatments   Labs (all labs ordered are listed, but only abnormal results are displayed) Labs Reviewed  COMPREHENSIVE METABOLIC PANEL - Abnormal; Notable for the following components:      Result Value   Glucose, Bld 119 (*)    Total Bilirubin 0.2 (*)    All other components within normal limits  URINALYSIS, ROUTINE W REFLEX MICROSCOPIC - Abnormal; Notable for the following components:   APPearance HAZY (*)    All other components within normal limits  CBC WITH DIFFERENTIAL/PLATELET  AMMONIA    EKG None  Radiology CT Head Wo Contrast  Result Date: 04/09/2023 CLINICAL DATA:  Altered mental status EXAM: CT HEAD  WITHOUT CONTRAST TECHNIQUE: Contiguous axial images were obtained from the base of the skull through the vertex without intravenous contrast. RADIATION DOSE REDUCTION: This exam was performed according to the departmental dose-optimization program which includes automated exposure control, adjustment of the mA and/or kV according to patient size and/or use of iterative reconstruction technique. COMPARISON:  Report from CT head dated 05/26/2017. FINDINGS: Brain: No evidence of acute infarction, hemorrhage, hydrocephalus, extra-axial collection or mass lesion/mass effect. Periventricular white matter hypoattenuation likely represents chronic small vessel ischemic disease. There is mild cerebral volume loss with associated ex vacuo dilatation. Vascular: There are vascular calcifications in the carotid siphons. Skull: Normal. Negative for fracture or focal lesion. Sinuses/Orbits: There is ethmoid sinus disease and trace fluid in the left mastoid air cells. Other: None. IMPRESSION: 1. No acute intracranial process. Electronically Signed   By: Romona Curls M.D.   On: 04/09/2023 18:04     Procedures Procedures    Medications Ordered in ED Medications - No data to display  ED Course/ Medical Decision Making/ A&P    Medical Decision Making Amount and/or Complexity of Data Reviewed Labs: ordered. Radiology: ordered.  Risk Prescription drug management.   89 y.o. female presents to the ER for evaluation of AMS and boil on vagina. Differential diagnosis includes but is not limited to Drug-related, hypoxia, hyper/hypoglycemia, encephalopathy, sepsis, DKA/HHS, brain lesion, CVA, seizure, environmental, psychiatric. Vital signs unremarkable. Physical exam as noted above.   I independently reviewed and interpreted the patient's labs.  CBC without cytosis or anemia.  Ammonia within normal limits.  CMP shows of a glucose of 119 and a mildly decreased total bili of 0.2.  Urinalysis shows hazy urine otherwise  unremarkable.  CT of the head shows no acute intracranial process.   I discussed the situation and presentation with my attending.  He agrees that this is likely stress-induced behavioral changes instead of stroke.  The patient is oriented x 4 with me and has a  nonfocal exam.  I spoke with the patient's son and daughter about her AMS.  The son reports that she usually is not as "mean" as she has been over the past few weeks.  He likely thinks this is because they are selling her house in order to pay for her to live in assisted living facility.  The son reports that she has acted like this before when her ammonia was high however whenever her ammonia was high it was more extreme in this.  When speaking with the patient's daughter, she supports this statement was saying that she has been really "bitter" lately as well.  She reports that she has been really demanding of staff which is not like her.  She agrees that this behavior change was after discussing the need to sell her house to to pay to the assisted living facility.  She reports that she called EMS because she thought the patient may have been having a stroke due to the behavioral changes.  She reports that the patient told her that she was packing up the picture frames in her apartment that she can move into her house.  I discussed with the daughter that the patient had the thought process of packing up her apartment to move to the house because she did not want to sell the house and she wanted to stay in the house.  I discussed with her that the behavioral change may be due to some dementia but I do not think that this is a need for any emergent MRI to be done to rule out a stroke.  This is likely stress-induced behavioral changes.  The daughter agrees to this and will come and pick up the patient.  The ulcer on her mons pubis appears to already be healing with granulosus tissue surrounding it. It still has a wet wound bed. Will prescribe  doxycycline. She does have intertrigo present in the bilateral groin but she reports she already has nystatin powder and cream for this. Discussed wound care of her ulcer/wound.   We discussed the results of the labs/imaging. The plan is take antibiotics for wound on vagina. Discussed wound care as well. We discussed strict return precautions and red flag symptoms. The patient verbalized their understanding and agrees to the plan. The patient is stable and being discharged home in good condition.  Portions of this report may have been transcribed using voice recognition software. Every effort was made to ensure accuracy; however, inadvertent computerized transcription errors may be present.   I discussed this case with my attending physician who cosigned this note including patient's presenting symptoms, physical exam, and planned diagnostics and interventions. Attending physician stated agreement with plan or made changes to plan which were implemented.   Final Clinical Impression(s) / ED Diagnoses Final diagnoses:  Mood changes  Folliculitis    Rx / DC Orders ED Discharge Orders          Ordered    doxycycline (VIBRAMYCIN) 100 MG capsule  2 times daily        04/09/23 2242              Achille Rich, PA-C 04/12/23 1841    Bethann Berkshire, MD 04/14/23 1149

## 2023-04-09 NOTE — ED Triage Notes (Signed)
Pt bib [facility] from the [facility]. EMS states the [facility] stated she has altered mental changes, states she has been more bitter the last few days. Pt states she has been normal, that she does have an abscess to her groin and some irritation to her groin and buttock.

## 2023-04-13 ENCOUNTER — Telehealth: Payer: Self-pay

## 2023-04-13 NOTE — Telephone Encounter (Signed)
Transition Care Management Follow-up Telephone Call Date of discharge and from where: [name] 6/16 How have you been since you were released from the hospital? Doing much better patient lives in [place] Any questions or concerns? No  Items Reviewed: Did the pt receive and understand the discharge instructions provided? Yes  Medications obtained and verified? Yes  Other? No  Any new allergies since your discharge? No  Dietary orders reviewed? No Do you have support at home? Yes    Follow up appointments reviewed:  PCP Hospital f/u appt confirmed? No  Scheduled to see  on  @ . Specialist Hospital f/u appt confirmed? No  Scheduled to see  on  @ . Are transportation arrangements needed? No  If their condition worsens, is the pt aware to call PCP or go to the Emergency Dept.? Yes Was the patient provided with contact information for the PCP's office or ED? Yes Was to pt encouraged to call back with questions or concerns? Yes

## 2023-05-14 IMAGING — CT CT RENAL STONE PROTOCOL
3 of 4 series · 11 of 46 positions shown, 16 images · non-contrast
Comparison: 01/27/2022

CLINICAL DATA: Flank pain with kidney stone suspected. Constipation
for 3 days



[Series 2: axial st · axial · 0.78mm/px · z∈[+892,+1237]mm · 7 of 93 slices shown, 12 images]
[im 12/93  soft-tissue]
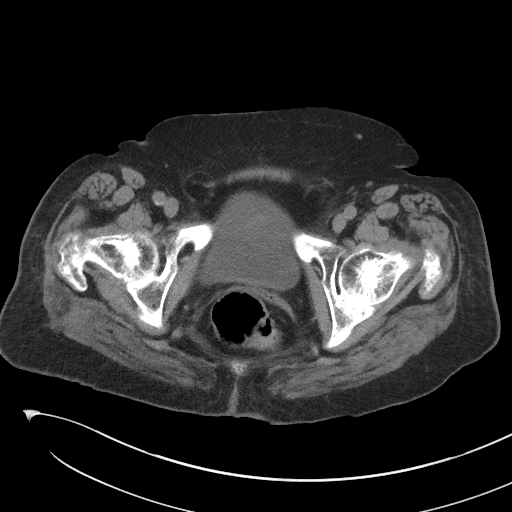
[im 12/93  bone]
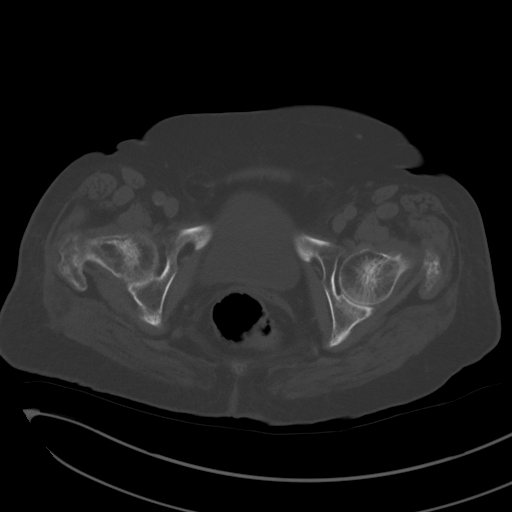
[im 24/93  soft-tissue]
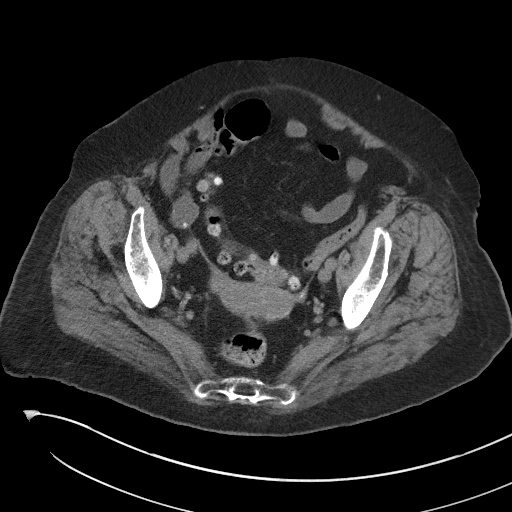
[im 35/93  soft-tissue]
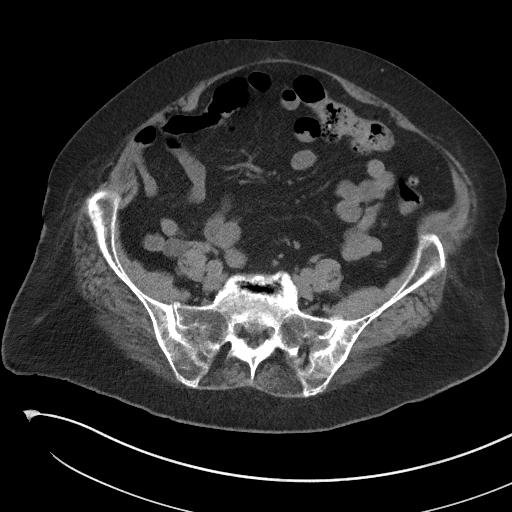
[im 47/93  soft-tissue]
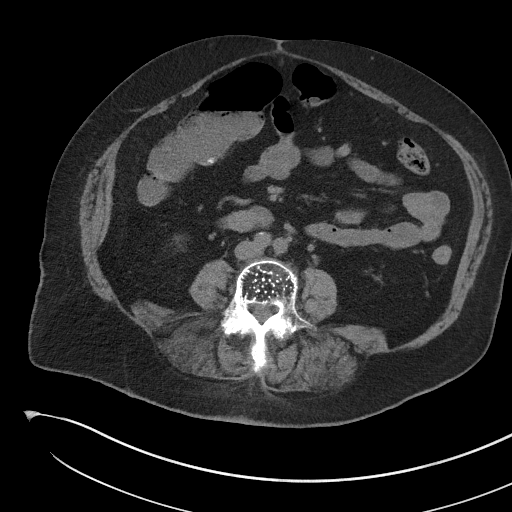
[im 47/93  lung]
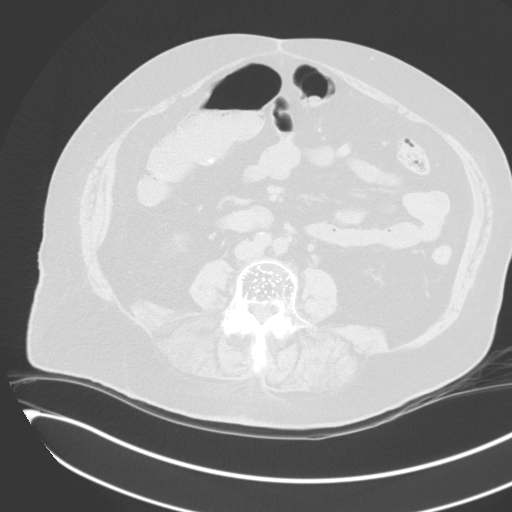
[im 58/93  soft-tissue]
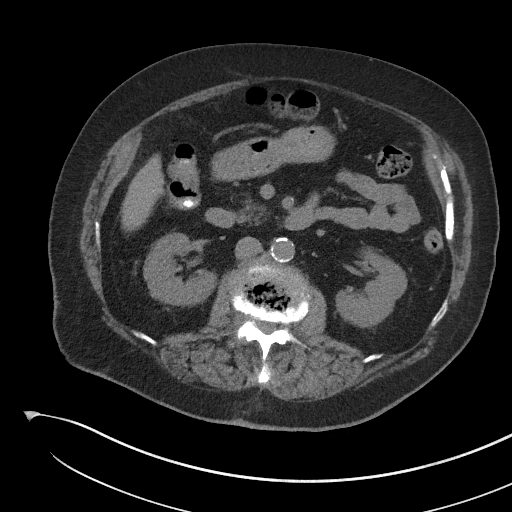
[im 58/93  lung]
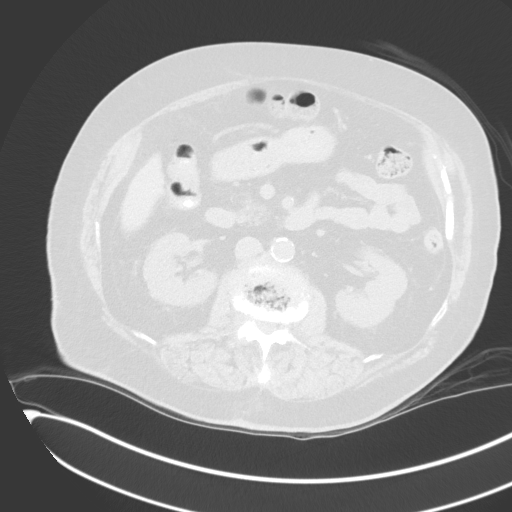
[im 70/93  soft-tissue]
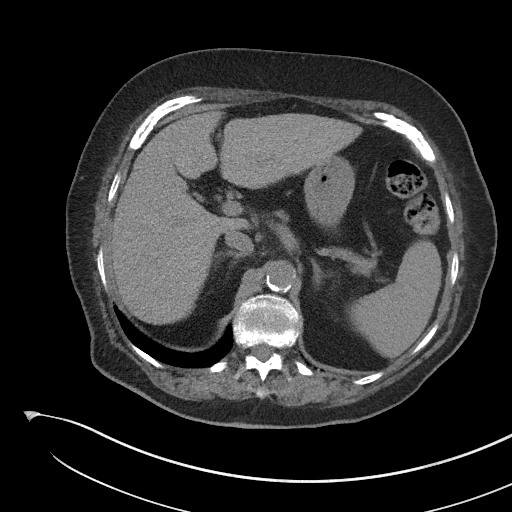
[im 70/93  lung]
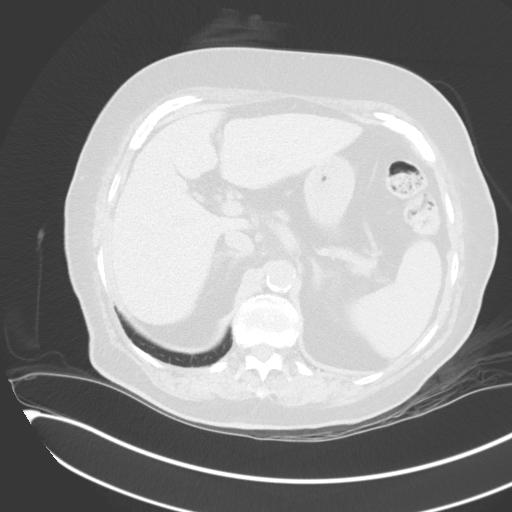
[im 81/93  soft-tissue]
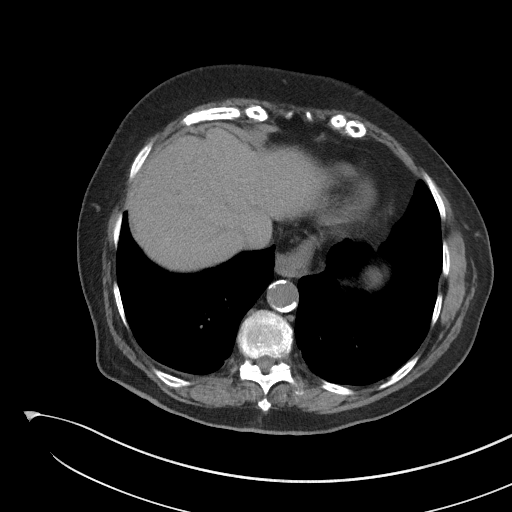
[im 81/93  lung]
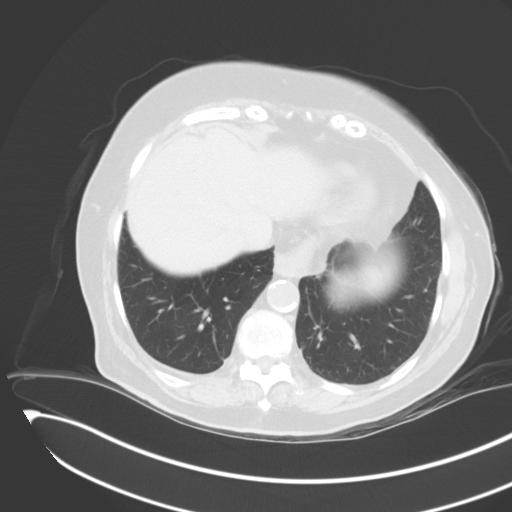

[Series 5: coronal st · coronal · 0.78mm/px · 3 of 100 slices shown]
[im 34/100  soft-tissue]
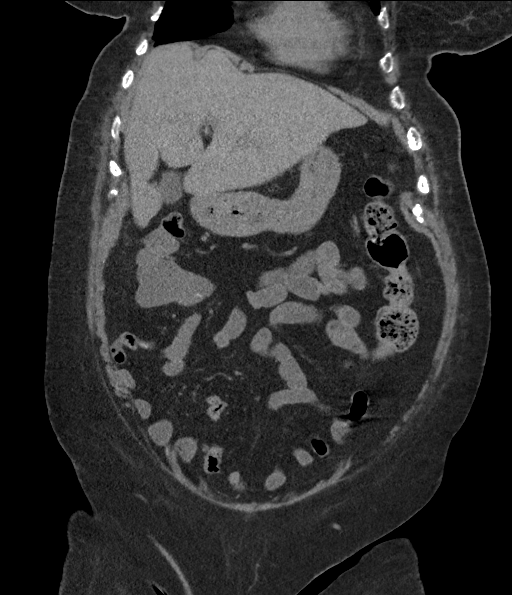
[im 45/100  soft-tissue]
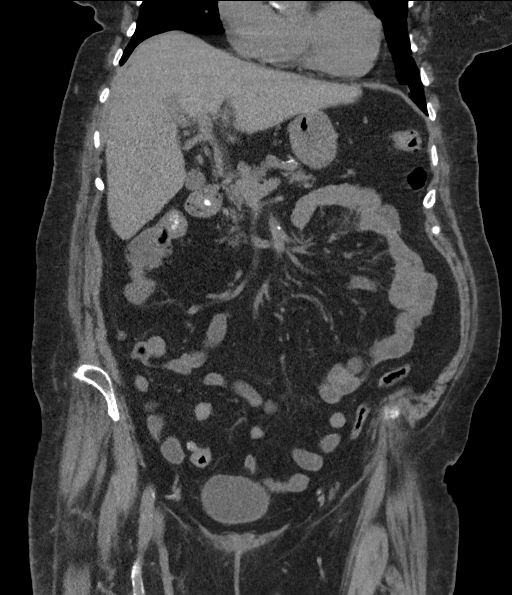
[im 56/100  soft-tissue]
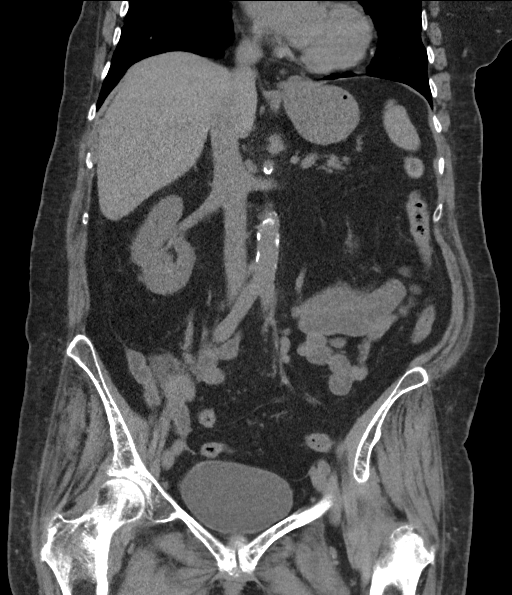

[Series 6: sagittal st · sagittal · 0.63mm/px · 1 of 113 slices shown]
[im 38/113  soft-tissue]
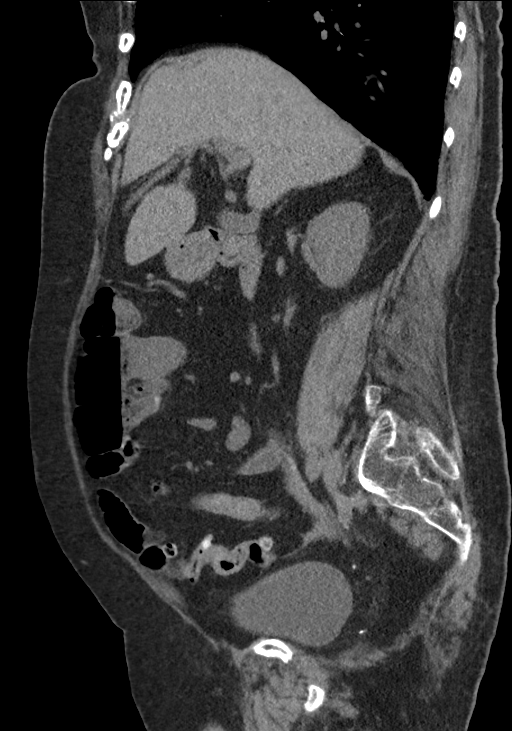

[11 of 46 positions shown; findings below may reference images not displayed]

FINDINGS: Lower chest:  Small sliding hiatal hernia

Hepatobiliary: Prominent liver fissures and caudate lobe size but no
definite surface lobulation for cirrhosis.No evidence of biliary
obstruction or stone.

Pancreas: Generalized atrophy

Spleen: Unremarkable.

Adrenals/Urinary Tract: Negative adrenals. No hydronephrosis or
stone. Unremarkable bladder.

Stomach/Bowel: No obstruction. Left colonic diverticulosis. No
visible bowel inflammation.

Vascular/Lymphatic: Atheromatous calcification of the aorta and
iliacs. No mass or adenopathy.

Reproductive:No pathologic findings. 13 mm simple appearing right
ovarian cyst. No follow-up imaging recommended. Note: This
recommendation does not apply to premenarchal patients and to those
with increased risk (genetic, family history, elevated tumor markers
or other high-risk factors) of ovarian cancer. Reference: JACR [DATE]):[id number]

Other: No ascites or pneumoperitoneum. Homogeneous nodule along the
posterior aspect of the right diaphragm, benign given multi year
stability

Musculoskeletal: L2 compression fracture with inferior endplate
depression and sclerosis. Fracture lines are still readily apparent
and there is mild paravertebral edema. Some callus is likely present
and there is vacuum phenomenon in the adjacent disc space.

Remote T12 compression fracture with cement augmentation.

Generalized osteopenia and lumbar spine degeneration with mild L4-5
anterolisthesis. Sizable hemangioma at the L4 body.
IMPRESSION: 1. Subacute/un-healed L2 compression fracture with mild height loss.
2. No acute intra-abdominal finding.
3. Atherosclerosis and colonic diverticulosis.

## 2023-05-19 DIAGNOSIS — J449 Chronic obstructive pulmonary disease, unspecified: Secondary | ICD-10-CM | POA: Diagnosis not present

## 2023-05-19 DIAGNOSIS — I700 Atherosclerosis of aorta: Secondary | ICD-10-CM | POA: Diagnosis not present

## 2023-05-19 DIAGNOSIS — F03918 Unspecified dementia, unspecified severity, with other behavioral disturbance: Secondary | ICD-10-CM | POA: Diagnosis not present

## 2023-05-19 DIAGNOSIS — I25119 Atherosclerotic heart disease of native coronary artery with unspecified angina pectoris: Secondary | ICD-10-CM | POA: Diagnosis not present

## 2023-05-19 DIAGNOSIS — N764 Abscess of vulva: Secondary | ICD-10-CM | POA: Diagnosis not present

## 2023-06-30 DIAGNOSIS — F419 Anxiety disorder, unspecified: Secondary | ICD-10-CM | POA: Diagnosis not present

## 2023-06-30 DIAGNOSIS — F03918 Unspecified dementia, unspecified severity, with other behavioral disturbance: Secondary | ICD-10-CM | POA: Diagnosis not present

## 2023-06-30 DIAGNOSIS — M1990 Unspecified osteoarthritis, unspecified site: Secondary | ICD-10-CM | POA: Diagnosis not present

## 2023-06-30 DIAGNOSIS — F1320 Sedative, hypnotic or anxiolytic dependence, uncomplicated: Secondary | ICD-10-CM | POA: Diagnosis not present

## 2023-07-03 DIAGNOSIS — N390 Urinary tract infection, site not specified: Secondary | ICD-10-CM | POA: Diagnosis not present

## 2023-07-03 DIAGNOSIS — I700 Atherosclerosis of aorta: Secondary | ICD-10-CM | POA: Diagnosis not present

## 2023-07-03 DIAGNOSIS — I25119 Atherosclerotic heart disease of native coronary artery with unspecified angina pectoris: Secondary | ICD-10-CM | POA: Diagnosis not present

## 2023-07-03 DIAGNOSIS — J449 Chronic obstructive pulmonary disease, unspecified: Secondary | ICD-10-CM | POA: Diagnosis not present

## 2023-07-06 DIAGNOSIS — I10 Essential (primary) hypertension: Secondary | ICD-10-CM | POA: Diagnosis not present

## 2023-07-06 DIAGNOSIS — M25561 Pain in right knee: Secondary | ICD-10-CM | POA: Diagnosis not present

## 2023-07-06 DIAGNOSIS — Z299 Encounter for prophylactic measures, unspecified: Secondary | ICD-10-CM | POA: Diagnosis not present

## 2023-07-17 DIAGNOSIS — N390 Urinary tract infection, site not specified: Secondary | ICD-10-CM | POA: Diagnosis not present

## 2023-07-17 DIAGNOSIS — R251 Tremor, unspecified: Secondary | ICD-10-CM | POA: Diagnosis not present

## 2023-09-20 DIAGNOSIS — I2510 Atherosclerotic heart disease of native coronary artery without angina pectoris: Secondary | ICD-10-CM | POA: Diagnosis not present

## 2023-09-20 DIAGNOSIS — Z7951 Long term (current) use of inhaled steroids: Secondary | ICD-10-CM | POA: Diagnosis not present

## 2023-09-20 DIAGNOSIS — J449 Chronic obstructive pulmonary disease, unspecified: Secondary | ICD-10-CM | POA: Diagnosis not present

## 2023-09-24 DIAGNOSIS — R3915 Urgency of urination: Secondary | ICD-10-CM | POA: Diagnosis not present

## 2023-09-24 DIAGNOSIS — I110 Hypertensive heart disease with heart failure: Secondary | ICD-10-CM | POA: Diagnosis not present

## 2023-09-24 DIAGNOSIS — I509 Heart failure, unspecified: Secondary | ICD-10-CM | POA: Diagnosis not present

## 2023-09-24 DIAGNOSIS — Z9181 History of falling: Secondary | ICD-10-CM | POA: Diagnosis not present

## 2023-09-27 DIAGNOSIS — R3915 Urgency of urination: Secondary | ICD-10-CM | POA: Diagnosis not present

## 2023-09-28 DIAGNOSIS — Z299 Encounter for prophylactic measures, unspecified: Secondary | ICD-10-CM | POA: Diagnosis not present

## 2023-09-28 DIAGNOSIS — F1320 Sedative, hypnotic or anxiolytic dependence, uncomplicated: Secondary | ICD-10-CM | POA: Diagnosis not present

## 2023-09-28 DIAGNOSIS — J449 Chronic obstructive pulmonary disease, unspecified: Secondary | ICD-10-CM | POA: Diagnosis not present

## 2023-09-28 DIAGNOSIS — I10 Essential (primary) hypertension: Secondary | ICD-10-CM | POA: Diagnosis not present

## 2023-09-28 DIAGNOSIS — M1990 Unspecified osteoarthritis, unspecified site: Secondary | ICD-10-CM | POA: Diagnosis not present

## 2023-09-28 DIAGNOSIS — F419 Anxiety disorder, unspecified: Secondary | ICD-10-CM | POA: Diagnosis not present

## 2023-09-28 DIAGNOSIS — Z79899 Other long term (current) drug therapy: Secondary | ICD-10-CM | POA: Diagnosis not present

## 2023-09-28 DIAGNOSIS — R52 Pain, unspecified: Secondary | ICD-10-CM | POA: Diagnosis not present

## 2023-09-29 DIAGNOSIS — J449 Chronic obstructive pulmonary disease, unspecified: Secondary | ICD-10-CM | POA: Diagnosis not present

## 2023-09-29 DIAGNOSIS — M179 Osteoarthritis of knee, unspecified: Secondary | ICD-10-CM | POA: Diagnosis not present

## 2023-10-11 DIAGNOSIS — I10 Essential (primary) hypertension: Secondary | ICD-10-CM | POA: Diagnosis not present

## 2023-10-11 DIAGNOSIS — M25561 Pain in right knee: Secondary | ICD-10-CM | POA: Diagnosis not present

## 2023-10-11 DIAGNOSIS — Z299 Encounter for prophylactic measures, unspecified: Secondary | ICD-10-CM | POA: Diagnosis not present

## 2023-10-26 DIAGNOSIS — H40033 Anatomical narrow angle, bilateral: Secondary | ICD-10-CM | POA: Diagnosis not present

## 2023-10-26 DIAGNOSIS — H2513 Age-related nuclear cataract, bilateral: Secondary | ICD-10-CM | POA: Diagnosis not present

## 2023-10-30 DIAGNOSIS — M179 Osteoarthritis of knee, unspecified: Secondary | ICD-10-CM | POA: Diagnosis not present

## 2023-10-30 DIAGNOSIS — J449 Chronic obstructive pulmonary disease, unspecified: Secondary | ICD-10-CM | POA: Diagnosis not present

## 2023-11-30 DIAGNOSIS — M179 Osteoarthritis of knee, unspecified: Secondary | ICD-10-CM | POA: Diagnosis not present

## 2023-11-30 DIAGNOSIS — J449 Chronic obstructive pulmonary disease, unspecified: Secondary | ICD-10-CM | POA: Diagnosis not present

## 2023-12-06 DIAGNOSIS — J209 Acute bronchitis, unspecified: Secondary | ICD-10-CM | POA: Diagnosis not present

## 2023-12-06 DIAGNOSIS — J440 Chronic obstructive pulmonary disease with acute lower respiratory infection: Secondary | ICD-10-CM | POA: Diagnosis not present

## 2023-12-06 DIAGNOSIS — I700 Atherosclerosis of aorta: Secondary | ICD-10-CM | POA: Diagnosis not present

## 2023-12-06 DIAGNOSIS — F03918 Unspecified dementia, unspecified severity, with other behavioral disturbance: Secondary | ICD-10-CM | POA: Diagnosis not present

## 2023-12-06 DIAGNOSIS — K7460 Unspecified cirrhosis of liver: Secondary | ICD-10-CM | POA: Diagnosis not present

## 2023-12-18 DIAGNOSIS — Z6832 Body mass index (BMI) 32.0-32.9, adult: Secondary | ICD-10-CM | POA: Diagnosis not present

## 2023-12-18 DIAGNOSIS — I10 Essential (primary) hypertension: Secondary | ICD-10-CM | POA: Diagnosis not present

## 2023-12-18 DIAGNOSIS — Z299 Encounter for prophylactic measures, unspecified: Secondary | ICD-10-CM | POA: Diagnosis not present

## 2023-12-18 DIAGNOSIS — F03918 Unspecified dementia, unspecified severity, with other behavioral disturbance: Secondary | ICD-10-CM | POA: Diagnosis not present

## 2023-12-18 DIAGNOSIS — Z1339 Encounter for screening examination for other mental health and behavioral disorders: Secondary | ICD-10-CM | POA: Diagnosis not present

## 2023-12-18 DIAGNOSIS — I700 Atherosclerosis of aorta: Secondary | ICD-10-CM | POA: Diagnosis not present

## 2023-12-18 DIAGNOSIS — Z1331 Encounter for screening for depression: Secondary | ICD-10-CM | POA: Diagnosis not present

## 2023-12-18 DIAGNOSIS — Z7189 Other specified counseling: Secondary | ICD-10-CM | POA: Diagnosis not present

## 2023-12-18 DIAGNOSIS — I25119 Atherosclerotic heart disease of native coronary artery with unspecified angina pectoris: Secondary | ICD-10-CM | POA: Diagnosis not present

## 2023-12-18 DIAGNOSIS — Z0000 Encounter for general adult medical examination without abnormal findings: Secondary | ICD-10-CM | POA: Diagnosis not present

## 2023-12-28 DIAGNOSIS — J449 Chronic obstructive pulmonary disease, unspecified: Secondary | ICD-10-CM | POA: Diagnosis not present

## 2023-12-28 DIAGNOSIS — M179 Osteoarthritis of knee, unspecified: Secondary | ICD-10-CM | POA: Diagnosis not present

## 2024-01-16 DIAGNOSIS — I10 Essential (primary) hypertension: Secondary | ICD-10-CM | POA: Diagnosis not present

## 2024-01-16 DIAGNOSIS — M25561 Pain in right knee: Secondary | ICD-10-CM | POA: Diagnosis not present

## 2024-01-16 DIAGNOSIS — R52 Pain, unspecified: Secondary | ICD-10-CM | POA: Diagnosis not present

## 2024-01-16 DIAGNOSIS — Z299 Encounter for prophylactic measures, unspecified: Secondary | ICD-10-CM | POA: Diagnosis not present

## 2024-01-28 DIAGNOSIS — J449 Chronic obstructive pulmonary disease, unspecified: Secondary | ICD-10-CM | POA: Diagnosis not present

## 2024-01-28 DIAGNOSIS — M179 Osteoarthritis of knee, unspecified: Secondary | ICD-10-CM | POA: Diagnosis not present

## 2024-02-08 DIAGNOSIS — I10 Essential (primary) hypertension: Secondary | ICD-10-CM | POA: Diagnosis not present

## 2024-02-08 DIAGNOSIS — M25561 Pain in right knee: Secondary | ICD-10-CM | POA: Diagnosis not present

## 2024-02-08 DIAGNOSIS — R21 Rash and other nonspecific skin eruption: Secondary | ICD-10-CM | POA: Diagnosis not present

## 2024-02-08 DIAGNOSIS — Z299 Encounter for prophylactic measures, unspecified: Secondary | ICD-10-CM | POA: Diagnosis not present

## 2024-02-27 ENCOUNTER — Emergency Department (HOSPITAL_COMMUNITY)

## 2024-02-27 ENCOUNTER — Inpatient Hospital Stay (HOSPITAL_COMMUNITY)

## 2024-02-27 ENCOUNTER — Inpatient Hospital Stay (HOSPITAL_COMMUNITY)
Admission: EM | Admit: 2024-02-27 | Discharge: 2024-03-04 | DRG: 205 | Disposition: A | Discharge status: Nursing Home (Includes ICF) | Source: Skilled Nursing Facility | Attending: Internal Medicine | Admitting: Internal Medicine

## 2024-02-27 ENCOUNTER — Encounter (HOSPITAL_COMMUNITY): Payer: Self-pay | Admitting: Internal Medicine

## 2024-02-27 ENCOUNTER — Other Ambulatory Visit: Payer: Self-pay

## 2024-02-27 DIAGNOSIS — I5031 Acute diastolic (congestive) heart failure: Secondary | ICD-10-CM | POA: Diagnosis not present

## 2024-02-27 DIAGNOSIS — R918 Other nonspecific abnormal finding of lung field: Secondary | ICD-10-CM

## 2024-02-27 DIAGNOSIS — F03A18 Unspecified dementia, mild, with other behavioral disturbance: Secondary | ICD-10-CM | POA: Diagnosis present

## 2024-02-27 DIAGNOSIS — T17508A Unspecified foreign body in bronchus causing other injury, initial encounter: Secondary | ICD-10-CM | POA: Diagnosis not present

## 2024-02-27 DIAGNOSIS — Z7982 Long term (current) use of aspirin: Secondary | ICD-10-CM

## 2024-02-27 DIAGNOSIS — F05 Delirium due to known physiological condition: Secondary | ICD-10-CM | POA: Diagnosis not present

## 2024-02-27 DIAGNOSIS — M79604 Pain in right leg: Secondary | ICD-10-CM | POA: Diagnosis not present

## 2024-02-27 DIAGNOSIS — E66811 Obesity, class 1: Secondary | ICD-10-CM | POA: Diagnosis present

## 2024-02-27 DIAGNOSIS — Z7189 Other specified counseling: Secondary | ICD-10-CM

## 2024-02-27 DIAGNOSIS — K7581 Nonalcoholic steatohepatitis (NASH): Secondary | ICD-10-CM | POA: Diagnosis present

## 2024-02-27 DIAGNOSIS — J690 Pneumonitis due to inhalation of food and vomit: Secondary | ICD-10-CM | POA: Diagnosis not present

## 2024-02-27 DIAGNOSIS — K219 Gastro-esophageal reflux disease without esophagitis: Secondary | ICD-10-CM | POA: Diagnosis present

## 2024-02-27 DIAGNOSIS — K2100 Gastro-esophageal reflux disease with esophagitis, without bleeding: Secondary | ICD-10-CM | POA: Diagnosis not present

## 2024-02-27 DIAGNOSIS — G629 Polyneuropathy, unspecified: Secondary | ICD-10-CM | POA: Diagnosis not present

## 2024-02-27 DIAGNOSIS — J9811 Atelectasis: Secondary | ICD-10-CM

## 2024-02-27 DIAGNOSIS — Z79899 Other long term (current) drug therapy: Secondary | ICD-10-CM

## 2024-02-27 DIAGNOSIS — T380X5A Adverse effect of glucocorticoids and synthetic analogues, initial encounter: Secondary | ICD-10-CM | POA: Diagnosis not present

## 2024-02-27 DIAGNOSIS — R0489 Hemorrhage from other sites in respiratory passages: Secondary | ICD-10-CM | POA: Diagnosis not present

## 2024-02-27 DIAGNOSIS — J441 Chronic obstructive pulmonary disease with (acute) exacerbation: Secondary | ICD-10-CM | POA: Diagnosis present

## 2024-02-27 DIAGNOSIS — Z515 Encounter for palliative care: Secondary | ICD-10-CM

## 2024-02-27 DIAGNOSIS — R0902 Hypoxemia: Secondary | ICD-10-CM | POA: Diagnosis not present

## 2024-02-27 DIAGNOSIS — Z299 Encounter for prophylactic measures, unspecified: Secondary | ICD-10-CM | POA: Diagnosis not present

## 2024-02-27 DIAGNOSIS — J209 Acute bronchitis, unspecified: Secondary | ICD-10-CM | POA: Diagnosis not present

## 2024-02-27 DIAGNOSIS — Z6830 Body mass index (BMI) 30.0-30.9, adult: Secondary | ICD-10-CM

## 2024-02-27 DIAGNOSIS — K7460 Unspecified cirrhosis of liver: Secondary | ICD-10-CM | POA: Diagnosis present

## 2024-02-27 DIAGNOSIS — J9601 Acute respiratory failure with hypoxia: Secondary | ICD-10-CM | POA: Diagnosis not present

## 2024-02-27 DIAGNOSIS — I5032 Chronic diastolic (congestive) heart failure: Secondary | ICD-10-CM | POA: Diagnosis not present

## 2024-02-27 DIAGNOSIS — I447 Left bundle-branch block, unspecified: Secondary | ICD-10-CM | POA: Diagnosis not present

## 2024-02-27 DIAGNOSIS — J9819 Other pulmonary collapse: Secondary | ICD-10-CM | POA: Diagnosis present

## 2024-02-27 DIAGNOSIS — E785 Hyperlipidemia, unspecified: Secondary | ICD-10-CM | POA: Diagnosis present

## 2024-02-27 DIAGNOSIS — I110 Hypertensive heart disease with heart failure: Secondary | ICD-10-CM | POA: Diagnosis not present

## 2024-02-27 DIAGNOSIS — D72829 Elevated white blood cell count, unspecified: Secondary | ICD-10-CM | POA: Diagnosis not present

## 2024-02-27 DIAGNOSIS — M1990 Unspecified osteoarthritis, unspecified site: Secondary | ICD-10-CM | POA: Diagnosis present

## 2024-02-27 DIAGNOSIS — E871 Hypo-osmolality and hyponatremia: Secondary | ICD-10-CM | POA: Diagnosis not present

## 2024-02-27 DIAGNOSIS — J449 Chronic obstructive pulmonary disease, unspecified: Secondary | ICD-10-CM | POA: Diagnosis not present

## 2024-02-27 DIAGNOSIS — R531 Weakness: Secondary | ICD-10-CM | POA: Diagnosis not present

## 2024-02-27 DIAGNOSIS — K589 Irritable bowel syndrome without diarrhea: Secondary | ICD-10-CM | POA: Diagnosis present

## 2024-02-27 DIAGNOSIS — Z8249 Family history of ischemic heart disease and other diseases of the circulatory system: Secondary | ICD-10-CM

## 2024-02-27 DIAGNOSIS — E869 Volume depletion, unspecified: Secondary | ICD-10-CM | POA: Diagnosis not present

## 2024-02-27 DIAGNOSIS — I2510 Atherosclerotic heart disease of native coronary artery without angina pectoris: Secondary | ICD-10-CM | POA: Diagnosis not present

## 2024-02-27 DIAGNOSIS — J9809 Other diseases of bronchus, not elsewhere classified: Secondary | ICD-10-CM | POA: Diagnosis not present

## 2024-02-27 DIAGNOSIS — Z48813 Encounter for surgical aftercare following surgery on the respiratory system: Secondary | ICD-10-CM | POA: Diagnosis not present

## 2024-02-27 DIAGNOSIS — Z1152 Encounter for screening for COVID-19: Secondary | ICD-10-CM | POA: Diagnosis not present

## 2024-02-27 DIAGNOSIS — I700 Atherosclerosis of aorta: Secondary | ICD-10-CM | POA: Diagnosis not present

## 2024-02-27 DIAGNOSIS — Z66 Do not resuscitate: Secondary | ICD-10-CM

## 2024-02-27 DIAGNOSIS — I10 Essential (primary) hypertension: Secondary | ICD-10-CM | POA: Diagnosis not present

## 2024-02-27 DIAGNOSIS — M179 Osteoarthritis of knee, unspecified: Secondary | ICD-10-CM | POA: Diagnosis not present

## 2024-02-27 DIAGNOSIS — Z09 Encounter for follow-up examination after completed treatment for conditions other than malignant neoplasm: Secondary | ICD-10-CM | POA: Diagnosis not present

## 2024-02-27 DIAGNOSIS — Z634 Disappearance and death of family member: Secondary | ICD-10-CM

## 2024-02-27 DIAGNOSIS — I509 Heart failure, unspecified: Secondary | ICD-10-CM | POA: Diagnosis not present

## 2024-02-27 DIAGNOSIS — R4589 Other symptoms and signs involving emotional state: Secondary | ICD-10-CM

## 2024-02-27 DIAGNOSIS — R0602 Shortness of breath: Secondary | ICD-10-CM | POA: Diagnosis not present

## 2024-02-27 DIAGNOSIS — Z7951 Long term (current) use of inhaled steroids: Secondary | ICD-10-CM

## 2024-02-27 DIAGNOSIS — J42 Unspecified chronic bronchitis: Secondary | ICD-10-CM | POA: Diagnosis not present

## 2024-02-27 DIAGNOSIS — G4700 Insomnia, unspecified: Secondary | ICD-10-CM | POA: Diagnosis not present

## 2024-02-27 DIAGNOSIS — R0989 Other specified symptoms and signs involving the circulatory and respiratory systems: Secondary | ICD-10-CM | POA: Diagnosis not present

## 2024-02-27 DIAGNOSIS — F419 Anxiety disorder, unspecified: Secondary | ICD-10-CM | POA: Diagnosis present

## 2024-02-27 DIAGNOSIS — R350 Frequency of micturition: Secondary | ICD-10-CM | POA: Diagnosis present

## 2024-02-27 LAB — BRAIN NATRIURETIC PEPTIDE: B Natriuretic Peptide: 179 pg/mL — ABNORMAL HIGH (ref 0.0–100.0)

## 2024-02-27 LAB — CBC
HCT: 39.4 % (ref 36.0–46.0)
Hemoglobin: 13.2 g/dL (ref 12.0–15.0)
MCH: 30.7 pg (ref 26.0–34.0)
MCHC: 33.5 g/dL (ref 30.0–36.0)
MCV: 91.6 fL (ref 80.0–100.0)
Platelets: 157 10*3/uL (ref 150–400)
RBC: 4.3 MIL/uL (ref 3.87–5.11)
RDW: 13.3 % (ref 11.5–15.5)
WBC: 19.7 10*3/uL — ABNORMAL HIGH (ref 4.0–10.5)
nRBC: 0 % (ref 0.0–0.2)

## 2024-02-27 LAB — BASIC METABOLIC PANEL WITH GFR
Anion gap: 12 (ref 5–15)
BUN: 15 mg/dL (ref 8–23)
CO2: 21 mmol/L — ABNORMAL LOW (ref 22–32)
Calcium: 8.8 mg/dL — ABNORMAL LOW (ref 8.9–10.3)
Chloride: 100 mmol/L (ref 98–111)
Creatinine, Ser: 0.99 mg/dL (ref 0.44–1.00)
GFR, Estimated: 54 mL/min — ABNORMAL LOW (ref >60)
Glucose, Bld: 146 mg/dL — ABNORMAL HIGH (ref 70–99)
Potassium: 4 mmol/L (ref 3.5–5.1)
Sodium: 133 mmol/L — ABNORMAL LOW (ref 135–145)

## 2024-02-27 LAB — BLOOD GAS, ARTERIAL
Acid-Base Excess: 3.2 mmol/L — ABNORMAL HIGH (ref 0.0–2.0)
Bicarbonate: 26.9 mmol/L (ref 20.0–28.0)
Drawn by: 27016
O2 Saturation: 95.4 %
Patient temperature: 37.5
pCO2 arterial: 38 mmHg (ref 32–48)
pH, Arterial: 7.46 — ABNORMAL HIGH (ref 7.35–7.45)
pO2, Arterial: 66 mmHg — ABNORMAL LOW (ref 83–108)

## 2024-02-27 LAB — RESP PANEL BY RT-PCR (RSV, FLU A&B, COVID)  RVPGX2
Influenza A by PCR: NEGATIVE
Influenza B by PCR: NEGATIVE
Resp Syncytial Virus by PCR: NEGATIVE
SARS Coronavirus 2 by RT PCR: NEGATIVE

## 2024-02-27 LAB — MRSA NEXT GEN BY PCR, NASAL: MRSA by PCR Next Gen: DETECTED — AB

## 2024-02-27 LAB — D-DIMER, QUANTITATIVE: D-Dimer, Quant: 1.35 ug{FEU}/mL — ABNORMAL HIGH (ref 0.00–0.50)

## 2024-02-27 LAB — PROCALCITONIN: Procalcitonin: <0.1 ng/mL

## 2024-02-27 LAB — AMMONIA: Ammonia: 37 umol/L — ABNORMAL HIGH (ref 9–35)

## 2024-02-27 MED ORDER — POLYETHYLENE GLYCOL 3350 17 G PO PACK
17.0000 g | PACK | Freq: Every day | ORAL | Status: DC | PRN
  Administered 2024-03-04: 17 g via ORAL
  Filled 2024-02-27: qty 1

## 2024-02-27 MED ORDER — METOPROLOL TARTRATE 25 MG PO TABS
25.0000 mg | ORAL_TABLET | Freq: Two times a day (BID) | ORAL | Status: DC
  Administered 2024-02-27 – 2024-03-04 (×13): 25 mg via ORAL
  Filled 2024-02-27 (×13): qty 1

## 2024-02-27 MED ORDER — ENOXAPARIN SODIUM 40 MG/0.4ML IJ SOSY
40.0000 mg | PREFILLED_SYRINGE | INTRAMUSCULAR | Status: DC
  Administered 2024-02-27 – 2024-02-28 (×2): 40 mg via SUBCUTANEOUS
  Filled 2024-02-27 (×2): qty 0.4

## 2024-02-27 MED ORDER — MUPIROCIN 2 % EX OINT
TOPICAL_OINTMENT | Freq: Two times a day (BID) | CUTANEOUS | Status: DC
  Filled 2024-02-27 (×4): qty 22

## 2024-02-27 MED ORDER — ISOSORBIDE MONONITRATE ER 60 MG PO TB24
60.0000 mg | ORAL_TABLET | Freq: Every day | ORAL | Status: DC
  Administered 2024-02-28 – 2024-03-04 (×6): 60 mg via ORAL
  Filled 2024-02-27 (×6): qty 1

## 2024-02-27 MED ORDER — FLUTICASONE FUROATE-VILANTEROL 100-25 MCG/ACT IN AEPB: 1.0000 | INHALATION_SPRAY | Freq: Every day | RESPIRATORY_TRACT | Status: DC

## 2024-02-27 MED ORDER — REVEFENACIN 175 MCG/3ML IN SOLN: 175.0000 ug | Freq: Every day | RESPIRATORY_TRACT | Status: DC

## 2024-02-27 MED ORDER — REVEFENACIN 175 MCG/3ML IN SOLN
175.0000 ug | Freq: Every day | RESPIRATORY_TRACT | Status: DC
  Administered 2024-02-28 – 2024-03-03 (×5): 175 ug via RESPIRATORY_TRACT
  Filled 2024-02-27 (×6): qty 3

## 2024-02-27 MED ORDER — ACETAMINOPHEN 325 MG PO TABS
650.0000 mg | ORAL_TABLET | Freq: Four times a day (QID) | ORAL | Status: DC | PRN
  Administered 2024-02-28: 650 mg via ORAL
  Filled 2024-02-27: qty 2

## 2024-02-27 MED ORDER — FERROUS SULFATE 325 (65 FE) MG PO TABS
325.0000 mg | ORAL_TABLET | Freq: Every day | ORAL | Status: DC
  Administered 2024-02-28 – 2024-03-04 (×5): 325 mg via ORAL
  Filled 2024-02-27 (×5): qty 1

## 2024-02-27 MED ORDER — SODIUM CHLORIDE 0.9 % IV SOLN
1.0000 g | INTRAVENOUS | Status: DC
  Administered 2024-02-27 – 2024-03-03 (×6): 1 g via INTRAVENOUS
  Filled 2024-02-27 (×7): qty 10

## 2024-02-27 MED ORDER — FUROSEMIDE 10 MG/ML IJ SOLN
20.0000 mg | Freq: Every day | INTRAMUSCULAR | Status: DC
  Administered 2024-02-28 – 2024-03-04 (×6): 20 mg via INTRAVENOUS
  Filled 2024-02-27 (×6): qty 2

## 2024-02-27 MED ORDER — CHLORHEXIDINE GLUCONATE CLOTH 2 % EX PADS
6.0000 | MEDICATED_PAD | Freq: Every day | CUTANEOUS | Status: DC
  Administered 2024-02-28 – 2024-03-04 (×5): 6 via TOPICAL

## 2024-02-27 MED ORDER — ALBUTEROL SULFATE (2.5 MG/3ML) 0.083% IN NEBU
INHALATION_SOLUTION | RESPIRATORY_TRACT | Status: AC
  Administered 2024-02-27: 2.5 mg
  Filled 2024-02-27: qty 6

## 2024-02-27 MED ORDER — ALBUTEROL SULFATE (2.5 MG/3ML) 0.083% IN NEBU: 2.5000 mg | INHALATION_SOLUTION | RESPIRATORY_TRACT | Status: DC | PRN

## 2024-02-27 MED ORDER — ONDANSETRON HCL 4 MG/2ML IJ SOLN: 4.0000 mg | Freq: Four times a day (QID) | INTRAMUSCULAR | Status: DC | PRN

## 2024-02-27 MED ORDER — ONDANSETRON HCL 4 MG PO TABS: 4.0000 mg | ORAL_TABLET | Freq: Four times a day (QID) | ORAL | Status: DC | PRN

## 2024-02-27 MED ORDER — PANTOPRAZOLE SODIUM 40 MG PO TBEC
40.0000 mg | DELAYED_RELEASE_TABLET | Freq: Every day | ORAL | Status: DC
  Administered 2024-02-27 – 2024-03-03 (×6): 40 mg via ORAL
  Filled 2024-02-27 (×6): qty 1

## 2024-02-27 MED ORDER — ALBUTEROL (5 MG/ML) CONTINUOUS INHALATION SOLN
5.0000 mg/h | INHALATION_SOLUTION | Freq: Once | RESPIRATORY_TRACT | Status: DC
  Filled 2024-02-27: qty 20

## 2024-02-27 MED ORDER — ARFORMOTEROL TARTRATE 15 MCG/2ML IN NEBU
15.0000 ug | INHALATION_SOLUTION | Freq: Two times a day (BID) | RESPIRATORY_TRACT | Status: DC
  Administered 2024-02-27 – 2024-03-03 (×10): 15 ug via RESPIRATORY_TRACT
  Filled 2024-02-27 (×13): qty 2

## 2024-02-27 MED ORDER — ARFORMOTEROL TARTRATE 15 MCG/2ML IN NEBU
15.0000 ug | INHALATION_SOLUTION | Freq: Two times a day (BID) | RESPIRATORY_TRACT | Status: DC
Start: 2024-02-27 — End: 2024-02-27

## 2024-02-27 MED ORDER — FUROSEMIDE 10 MG/ML IJ SOLN
40.0000 mg | Freq: Once | INTRAMUSCULAR | Status: AC
  Administered 2024-02-27: 40 mg via INTRAVENOUS
  Filled 2024-02-27: qty 4

## 2024-02-27 MED ORDER — IOHEXOL 350 MG/ML SOLN
75.0000 mL | Freq: Once | INTRAVENOUS | Status: AC | PRN
  Administered 2024-02-27: 75 mL via INTRAVENOUS

## 2024-02-27 MED ORDER — QUETIAPINE FUMARATE 25 MG PO TABS
12.5000 mg | ORAL_TABLET | Freq: Every day | ORAL | Status: DC
  Administered 2024-02-28 – 2024-02-29 (×2): 12.5 mg via ORAL
  Filled 2024-02-27 (×3): qty 1

## 2024-02-27 MED ORDER — METHYLPREDNISOLONE SODIUM SUCC 125 MG IJ SOLR
125.0000 mg | Freq: Once | INTRAMUSCULAR | Status: AC
  Administered 2024-02-27: 125 mg via INTRAVENOUS
  Filled 2024-02-27: qty 2

## 2024-02-27 MED ORDER — IPRATROPIUM-ALBUTEROL 0.5-2.5 (3) MG/3ML IN SOLN
3.0000 mL | Freq: Once | RESPIRATORY_TRACT | Status: AC
  Administered 2024-02-27: 3 mL via RESPIRATORY_TRACT
  Filled 2024-02-27: qty 3

## 2024-02-27 MED ORDER — BUDESONIDE 0.25 MG/2ML IN SUSP
0.2500 mg | Freq: Two times a day (BID) | RESPIRATORY_TRACT | Status: DC
  Administered 2024-02-27 – 2024-03-03 (×11): 0.25 mg via RESPIRATORY_TRACT
  Filled 2024-02-27 (×13): qty 2

## 2024-02-27 MED ORDER — ASPIRIN 81 MG PO CHEW
81.0000 mg | CHEWABLE_TABLET | Freq: Every day | ORAL | Status: DC
  Administered 2024-02-28 – 2024-03-04 (×5): 81 mg via ORAL
  Filled 2024-02-27 (×5): qty 1

## 2024-02-27 MED ORDER — METHYLPREDNISOLONE SODIUM SUCC 40 MG IJ SOLR
40.0000 mg | Freq: Two times a day (BID) | INTRAMUSCULAR | Status: DC
  Administered 2024-02-27 – 2024-03-01 (×6): 40 mg via INTRAVENOUS
  Filled 2024-02-27 (×6): qty 1

## 2024-02-27 MED ORDER — ACETAMINOPHEN 650 MG RE SUPP: 650.0000 mg | Freq: Four times a day (QID) | RECTAL | Status: DC | PRN

## 2024-02-27 NOTE — Plan of Care (Signed)

## 2024-02-27 NOTE — ED Triage Notes (Signed)
 Pt arrived via [facility] from the [facility] c/o generalized weakness and shob x Sunday. Hx of dementia at baseline. EDP at bedside. SpO2 on RA 80% on 4 L 84% and continuted to titrate up on oxygen  via [place] until SpO2 in the low 90s. Pt on 6 L via [place] at this time. Pt does not have a Hx of use of oxygen 

## 2024-02-27 NOTE — ED Provider Notes (Signed)
 [place] EMERGENCY DEPARTMENT AT [facility] Provider Note   CSN: [id number] Arrival date & time: 02/27/24  1003     History  Chief Complaint  Patient presents with   Shortness of Breath   Level 5 caveat due to altered mental status Heather [name] is a 89 y.o. female.  The history is provided by the patient and the nursing home.  Patient with history of anxiety, GERD, liver disease, dementia, possible history of COPD presents with shortness of breath Patient unable provide much history but does report she is felt increasing shortness of breath and urinary frequency  Nurse reports patient had to be placed on oxygen  on arrival due to hypoxia, she is not typically on oxygen     Past Medical History:  Diagnosis Date   Anxiety    GERD (gastroesophageal reflux disease)    Hx of shoulder surgery    Left Shoulder - pitched nerve.   Other and unspecified hyperlipidemia    Unspecified essential hypertension     Home Medications Prior to Admission medications   Medication Sig Start Date End Date Taking? Authorizing Provider  acetaminophen  (TYLENOL ) 500 MG tablet Take 500 mg by mouth every 4 (four) hours as needed for mild pain, moderate pain or fever.    [provider]  albuterol  (VENTOLIN  HFA) 108 (90 Base) MCG/ACT inhaler Inhale 2 puffs into the lungs every 4 (four) hours as needed for wheezing or shortness of breath. 01/28/22   Colin Dawley, MD  ALPRAZolam  (XANAX ) 0.5 MG tablet Take 2 tablets (1 mg total) by mouth 3 (three) times daily. 04/28/22   Gwendalyn Lemma, MD  amLODipine  (NORVASC ) 10 MG tablet Take 10 mg by mouth every morning.  07/14/17   [provider]  aspirin  81 MG tablet Take 1 tablet (81 mg total) by mouth daily with breakfast. 01/28/22   Emokpae, Courage, MD  Calcium -Magnesium-Zinc (CAL-MAG-ZINC PO) Take 1 tablet by mouth daily.    [provider]  Diclofenac Sodium 3 % GEL Apply 1-2 g topically in the morning, at noon, and at  bedtime.    [provider]  doxycycline  (VIBRAMYCIN ) 100 MG capsule Take 1 capsule (100 mg total) by mouth 2 (two) times daily. 04/09/23   Spence Dux, PA-C  ferrous sulfate 325 (65 FE) MG tablet Take 325 mg by mouth daily with breakfast.    [provider]  fluticasone  furoate-vilanterol (BREO ELLIPTA ) 100-25 MCG/ACT AEPB Inhale 1 puff into the lungs daily. 03/04/20   [provider]  furosemide  (LASIX ) 40 MG tablet Take 40 mg by mouth daily.    [provider]  gabapentin  (NEURONTIN ) 300 MG capsule Take 300 mg by mouth daily. 01/17/22   [provider]  HYDROcodone -acetaminophen  (NORCO/VICODIN) 5-325 MG tablet Take 1 tablet by mouth every 6 (six) hours as needed for moderate pain. 04/28/22   Gwendalyn Lemma, MD  isosorbide  mononitrate (IMDUR ) 60 MG 24 hr tablet Take 60 mg by mouth daily. 01/17/22   [provider]  lactulose  (CHRONULAC ) 10 GM/15ML solution Take 30 mLs (20 g total) by mouth 2 (two) times daily. Goal is to Have at least 2 mushy/Non-solid bowel movements Each day-- May give 1 additional dose of lactulose  if no bowel movement in 24 hr period Patient taking differently: Take 20 g by mouth 2 (two) times daily. 01/28/22   Colin Dawley, MD  lidocaine  (LIDODERM ) 5 % Place 1 patch onto the skin daily. Remove & Discard patch within 12 hours or as directed by MD 04/28/22  Gwendalyn Lemma, MD  metoprolol  tartrate (LOPRESSOR ) 25 MG tablet Take 25 mg by mouth 2 (two) times daily. 07/10/18   [provider]  Naphazoline-Pheniramine (OPCON-A) 0.027-0.315 % SOLN Place 1 drop into both eyes 2 (two) times daily as needed (dry eyes).    [provider]  nitroGLYCERIN  (NITROSTAT ) 0.4 MG SL tablet Place 0.4 mg under the tongue every 5 (five) minutes as needed for chest pain.    [provider]  omeprazole (PRILOSEC) 40 MG capsule Take 40 mg by mouth daily.    [provider]  polyethylene glycol (MIRALAX  / GLYCOLAX ) 17  g packet Take 17 g by mouth daily as needed. 04/28/22   Gwendalyn Lemma, MD  potassium chloride  (KLOR-CON ) 10 MEQ tablet Take 1 tablet (10 mEq total) by mouth daily. Take While taking Lactulose  01/28/22   Colin Dawley, MD  QUEtiapine  (SEROQUEL ) 25 MG tablet Take 0.5 tablets (12.5 mg total) by mouth at bedtime. 04/29/22   Gwendalyn Lemma, MD  senna-docusate (SENOKOT-S) 8.6-50 MG tablet Take 1 tablet by mouth 2 (two) times daily. 04/28/22   Gwendalyn Lemma, MD  sucralfate (CARAFATE) 1 g tablet Take 1 g by mouth 4 (four) times daily. 01/17/22   [provider]      Allergies    Patient has no known allergies.    Review of Systems   Review of Systems  Unable to perform ROS: Mental status change    Physical Exam Updated Vital Signs BP 131/69   Pulse 86   Temp 99.6 F (37.6 C) (Oral)   Resp (!) 25   SpO2 94%  Physical Exam CONSTITUTIONAL: Elderly, ill-appearing HEAD: Normocephalic/atraumatic EYES: EOMI ENMT: Mucous membranes moist NECK: supple no meningeal signs CV: S1/S2 noted, no murmurs/rubs/gallops noted LUNGS: Tachypneic, wheezing bilaterally, on nasal cannula ABDOMEN: soft, nontender, NEURO: Pt is awake/alert, moves all extremitiesx4.  No facial droop.  Patient appears confused EXTREMITIES: pulses normal/equal, full ROM, no deformities, pelvis stable, SKIN: warm, color normal  ED Results / Procedures / Treatments   Labs (all labs ordered are listed, but only abnormal results are displayed) Labs Reviewed  CBC - Abnormal; Notable for the following components:      Result Value   WBC 19.7 (*)    All other components within normal limits  BRAIN NATRIURETIC PEPTIDE - Abnormal; Notable for the following components:   B Natriuretic Peptide 179.0 (*)    All other components within normal limits  BASIC METABOLIC PANEL WITH GFR - Abnormal; Notable for the following components:   Sodium 133 (*)    CO2 21 (*)    Glucose, Bld 146 (*)    Calcium  8.8 (*)    GFR, Estimated 54  (*)    All other components within normal limits  AMMONIA - Abnormal; Notable for the following components:   Ammonia 37 (*)    All other components within normal limits  BLOOD GAS, ARTERIAL - Abnormal; Notable for the following components:   pH, Arterial 7.46 (*)    pO2, Arterial 66 (*)    Acid-Base Excess 3.2 (*)    All other components within normal limits  RESP PANEL BY RT-PCR (RSV, FLU A&B, COVID)  [id number]  URINALYSIS, ROUTINE W REFLEX MICROSCOPIC  D-DIMER, QUANTITATIVE    EKG EKG Interpretation Date/Time:  Tuesday Feb 27 2024 10:24:44 EDT Ventricular Rate:  75 PR Interval:  76 QRS Duration:  140 QT Interval:  394 QTC Calculation: 441 R Axis:   26  Text Interpretation: Sinus rhythm Short PR  interval Left bundle branch block Confirmed by Eldon Greenland ([id number]) on 02/27/2024 10:33:49 AM  Radiology DG Chest Port 1 View Result Date: 02/27/2024 CLINICAL DATA:  Shortness of breath EXAM: PORTABLE CHEST 1 VIEW COMPARISON:  January 27, 2022 FINDINGS: The heart size and mediastinal contours are within normal limits. Both lungs are clear. The visualized skeletal structures are unremarkable. Mild equalization flow towards the apices and interstitial prominence could correlate with subtle congestive changes with a small bilateral pleural reactions IMPRESSION: Mild congestive changes with small bilateral pleural reactions. Electronically Signed   By: Fredrich Jefferson M.D.   On: 02/27/2024 10:53    Procedures .Critical Care  Performed by: Eldon Greenland, MD Authorized by: Eldon Greenland, MD   Critical care provider statement:    Critical care time (minutes):  80   Critical care start time:  02/27/2024 10:30 AM   Critical care end time:  02/27/2024 11:50 AM   Critical care time was exclusive of:  Separately billable procedures and treating other patients   Critical care was necessary to treat or prevent imminent or life-threatening deterioration of the following conditions:  Respiratory  failure and dehydration   Critical care was time spent personally by me on the following activities:  Examination of patient, development of treatment plan with patient or surrogate, re-evaluation of patient's condition, pulse oximetry, ordering and review of radiographic studies, ordering and review of laboratory studies, ordering and performing treatments and interventions, review of old charts, evaluation of patient's response to treatment and obtaining history from patient or surrogate   I assumed direction of critical care for this patient from another provider in my specialty: no     Care discussed with: admitting provider       Medications Ordered in ED Medications  furosemide  (LASIX ) injection 40 mg (has no administration in time range)  albuterol  (PROVENTIL ,VENTOLIN ) solution continuous neb (has no administration in time range)  ipratropium-albuterol  (DUONEB) 0.5-2.5 (3) MG/3ML nebulizer solution 3 mL (3 mLs Nebulization Given 02/27/24 1050)  methylPREDNISolone  sodium succinate (SOLU-MEDROL ) 125 mg/2 mL injection 125 mg (125 mg Intravenous Given 02/27/24 1059)    ED Course/ Medical Decision Making/ A&P Clinical Course as of 02/27/24 1152  Tue Feb 27, 2024  1027 Patient presents for local nursing home for increasing shortness of breath.  I called the facility and they reported the past several days she has had increasing shortness of breath and wheezing and generalized weakness.  Patient does have dementia No further details were provided by nursing home [DW]  1143 Overall patient appears to be improving She is awake and alert, but is on several liters.  She still has wheezing Possible congestive changes on x-ray, will give Lasix  [DW]  1144 Daughter is at bedside.  She reports patient has been having increasing shortness of breath over the past several days She has had wheezing and used albuterol  previously but no formal diagnosis of COPD and she is a never smoker Patient will be  admitted for acute respiratory failure with strong component of underlying bronchospasm but may also have mild CHF [DW]  1147 Discussed with Dr. Bobbetta Burnet Discussed the patient's care.  He will admit the patient.  Will add on D-dimer to screen for PE [DW]    Clinical Course User Index [DW] Eldon Greenland, MD                                 Medical Decision Making Amount and/or  Complexity of Data Reviewed Labs: ordered. Radiology: ordered.  Risk Prescription drug management. Decision regarding hospitalization.   This patient presents to the ED for concern of shortness of breath, this involves an extensive number of treatment options, and is a complaint that carries with it a high risk of complications and morbidity.  The differential diagnosis includes but is not limited to Acute coronary syndrome, pneumonia, acute pulmonary edema, pneumothorax, acute anemia, pulmonary embolism    Comorbidities that complicate the patient evaluation: Patient's presentation is complicated by their history of hypertension  Social Determinants of Health: Patient's  nursing home residents, confusion   increases the complexity of managing their presentation  Additional history obtained: Additional history obtained from nursing home/care facility Records reviewed previous admission documents  Lab Tests: I Ordered, and personally interpreted labs.  The pertinent results include: leukoCytosis  Imaging Studies ordered: I ordered imaging studies including X-ray chest   I independently visualized and interpreted imaging which showed mild congestive changes I agree with the radiologist interpretation  Cardiac Monitoring: The patient was maintained on a cardiac monitor.  I personally viewed and interpreted the cardiac monitor which showed an underlying rhythm of:  sinus rhythm  Medicines ordered and prescription drug management: I ordered medication including nebulizer therapies for wheezing Reevaluation of  the patient after these medicines showed that the patient    improved  Critical Interventions:   nebulizer therapies, admission  Consultations Obtained: I requested consultation with the admitting physician [facility] , and discussed  findings as well as pertinent plan - they recommend: Admit  Reevaluation: After the interventions noted above, I reevaluated the patient and found that they have :improved  Complexity of problems addressed: Patient's presentation is most consistent with  acute presentation with potential threat to life or bodily function  Disposition: After consideration of the diagnostic results and the patient's response to treatment,  I feel that the patent would benefit from admission   .           Final Clinical Impression(s) / ED Diagnoses Final diagnoses:  Acute respiratory failure with hypoxia [facility])    Rx / DC Orders ED Discharge Orders     None         Eldon Greenland, MD 02/27/24 1154

## 2024-02-27 NOTE — ED Provider Notes (Signed)
 D-dimer elevated Pt stable Will get CT chest on the way to inpatient bed D/w Dr Bobbetta Burnet   Eldon Greenland, MD 02/27/24 1218

## 2024-02-27 NOTE — Consult Note (Signed)
 NAME:  Marci Pinkstaff, MRN:  [id number], DOB:  07-25-1931, LOS: 0 ADMISSION DATE:  02/27/2024, CONSULTATION DATE:  02/27/24 REFERRING MD:  Bobbetta Burnet, CHIEF COMPLAINT:  SOB    History of Present Illness:  89 yo F PMH reported/presumed COPD on Breo, NASH cirrhosis, dementia who presented to [facility] ED 02/27/24 w SOB from nursing home. SOB x 4d. Associated hypoxia req O2. She had a CTA PE study in ED which was did not show PE, did show RML RLL collapse/volume loss, and what looks like aspirated foreign body in bronchus intermedius (vs bronchial artery aneurysm).   Admitted to [facility] with plan to transfer to [place] for pulm eval, possible bronch.  While at [facility], seen by palliative care (spoke w pt daughter) and goals of care are full scope of offered tx, full code. This sounds like it is largely based on previous conversation between pt and PCP several years ago.   PCCM is consulted In this setting.   Pertinent  Medical History  Dementia  Presumed COPD  GERD HTN HLD IBS NASH cirrhosis Diastolic HF   Significant Hospital Events: Including procedures, antibiotic start and stop dates in addition to other pertinent events   5/6- admit to [facility]. Txf to [place] for pulm consult, possible bronch given CTA chest findings concerning for foreign body aspirated into bronchus intermedius   Interim History / Subjective:  ***  Objective   Blood pressure (!) 117/47, pulse 95, temperature 99.5 F (37.5 C), temperature source Oral, resp. rate (!) 21, SpO2 97%.       No intake or output data in the 24 hours ending 02/27/24 1634 There were no vitals filed for this visit.  Examination: General: *** HENT: *** Lungs: *** Cardiovascular: *** Abdomen: *** Extremities: *** Neuro: *** GU: ***  Resolved Hospital Problem list     Assessment & Plan:   Acute respiratory failure with hypoxia RML RLL collapse, aspiration into airway (bronchus intermedius) (vs ddx: bronchial artery aneurysm) Presumed underlying  COPD P -NPO. Eventually will need SLP eval  -[place] for goal SpO2 92  -agree w triple therapy  -Rx steroids for possible AECOPD -rocephin  for PNA  -will discuss possible bronch vs aggressive pulm hygiene and sparing an invasive procedure -agree w ongoing G[facility] discussions   Best Practice (right click and "Reselect all SmartList Selections" daily)   Diet/type: {diet type:25684} DVT prophylaxis {anticoagulation:25687} Pressure ulcer(s): {pressure ulcer(s):31683} GI prophylaxis: {WU:98119} Lines: {Central Venous Access:25771} Foley:  {Central Venous Access:25691} Code Status:  {Code Status:26939} Last date of multidisciplinary goals of care discussion [***]  Labs   CBC: Recent Labs  Lab 02/27/24 1049  WBC 19.7*  HGB 13.2  HCT 39.4  MCV 91.6  PLT 157    Basic Metabolic Panel: Recent Labs  Lab 02/27/24 1049  NA 133*  K 4.0  CL 100  CO2 21*  GLUCOSE 146*  BUN 15  CREATININE 0.99  CALCIUM  8.8*   GFR: CrCl cannot be calculated (Unknown ideal weight.). Recent Labs  Lab 02/27/24 1049  PROCALCITON <0.10  WBC 19.7*    Liver Function Tests: No results for input(s): "AST", "ALT", "ALKPHOS", "BILITOT", "PROT", "ALBUMIN" in the last 168 hours. No results for input(s): "LIPASE", "AMYLASE" in the last 168 hours. Recent Labs  Lab 02/27/24 1049  AMMONIA 37*    ABG    Component Value Date/Time   PHART 7.46 (H) 02/27/2024 1051   PCO2ART 38 02/27/2024 1051   PO2ART 66 (L) 02/27/2024 1051   HCO3 26.9 02/27/2024 1051  O2SAT 95.4 02/27/2024 1051     Coagulation Profile: No results for input(s): "INR", "PROTIME" in the last 168 hours.  Cardiac Enzymes: No results for input(s): "CKTOTAL", "CKMB", "CKMBINDEX", "TROPONINI" in the last 168 hours.  HbA1C: No results found for: "HGBA1C"  CBG: No results for input(s): "GLUCAP" in the last 168 hours.  Review of Systems:   ***  Past Medical History:  She,  has a past medical history of Anxiety, GERD (gastroesophageal  reflux disease), shoulder surgery, Other and unspecified hyperlipidemia, and Unspecified essential hypertension.   Surgical History:   Past Surgical History:  Procedure Laterality Date   BIOPSY  04/09/2020   Procedure: BIOPSY;  Surgeon: Ruby Corporal, MD;  Location: [facility];  Service: Endoscopy;;   CERVICAL DISC SURGERY     CESAREAN SECTION  1970   colonscopy  08/22/2012   moderate diverticulosos in sigmoid and two tubular adenomas   ESOPHAGOGASTRODUODENOSCOPY N/A 04/09/2020   Rehman:Normal hypopharynx.normal esophagus, z line irreg, 38 cm from incisors, erythematous mucosa in antrum and preplyoric region, gastric antral mucosa with non specific reactive gastropathy, normal duodenal bul and second portion of duodenum   History of shoulder surgery Left    pitched nerve.     Social History:   reports that she has never smoked. She has never used smokeless tobacco. She reports that she does not drink alcohol  and does not use drugs.   Family History:  Her family history includes Heart attack in her sister; Hypertension in an other family member.   Allergies No Known Allergies   Home Medications  Prior to Admission medications   Medication Sig Start Date End Date Taking? Authorizing Provider  acetaminophen  (TYLENOL ) 500 MG tablet Take 500 mg by mouth every 4 (four) hours as needed for mild pain, moderate pain or fever.   Yes [provider]  ALPRAZolam  (XANAX ) 1 MG tablet Take 1 mg by mouth 2 (two) times daily. 03/16/12  Yes [provider]  aluminum-magnesium hydroxide-simethicone (MAALOX) 200-200-20 MG/5ML SUSP Take 30 mLs by mouth every 6 (six) hours as needed (indigestion, heartburn).   Yes [provider]  amLODipine  (NORVASC ) 10 MG tablet Take 10 mg by mouth every morning.  07/14/17  Yes [provider]  aspirin  81 MG tablet Take 1 tablet (81 mg total) by mouth daily with breakfast. 01/28/22  Yes Emokpae, Courage, MD  BACILLUS  COAGULANS-INULIN PO Take 1 capsule by mouth daily.   Yes [provider]  benzonatate (TESSALON) 100 MG capsule Take 100 mg by mouth 3 (three) times daily as needed for cough.   Yes [provider]  Calcium -Magnesium-Zinc (CAL-MAG-ZINC PO) Take 1 tablet by mouth daily. Calcium , Vit D3, Magnesium Oxide, Zinc Oxide 333 mg- 133 unit- 133 mg- 5 mg   Yes [provider]  Diclofenac Sodium 3 % GEL Apply 1-2 g topically 3 (three) times daily as needed (pain). Apply to lower back, hips, and knees   Yes [provider]  dicyclomine  (BENTYL ) 10 MG capsule Take 10 mg by mouth 4 (four) times daily -  before meals and at bedtime. 06/15/20  Yes [provider]  docusate sodium  (COLACE) 100 MG capsule Take 100 mg by mouth daily.   Yes [provider]  ferrous sulfate 325 (65 FE) MG tablet Take 325 mg by mouth daily with breakfast.   Yes [provider]  furosemide  (LASIX ) 20 MG tablet Take 20 mg by mouth daily as needed for edema or fluid. 01/10/24  Yes [provider]  furosemide  (LASIX ) 40 MG tablet Take 40 mg by mouth daily. Leg swelling   Yes [provider]  gabapentin  (NEURONTIN ) 300 MG capsule Take 300-600 mg by mouth 2 (two) times daily. 300 mg am, 600 mg pm 01/17/22  Yes [provider]  ibuprofen (ADVIL) 600 MG tablet Take 600 mg by mouth 3 (three) times daily as needed (ear pain).   Yes [provider]  isosorbide  mononitrate (IMDUR ) 60 MG 24 hr tablet Take 60 mg by mouth daily. 01/17/22  Yes [provider]  lactulose  (CHRONULAC ) 10 GM/15ML solution Take 30 mLs (20 g total) by mouth 2 (two) times daily. Goal is to Have at least 2 mushy/Non-solid bowel movements Each day-- May give 1 additional dose of lactulose  if no bowel movement in 24 hr period Patient taking differently: Take 15 mLs by mouth 2 (two) times daily as needed for mild constipation. + every 3 days for bowel regulation 01/28/22  Yes  Emokpae, Courage, MD  lidocaine  4 % Place 1 patch onto the skin daily. 12 hours on, 12 hours off   Yes [provider]  melatonin 3 MG TABS tablet Take 3 mg by mouth at bedtime.   Yes [provider]  metoprolol  tartrate (LOPRESSOR ) 25 MG tablet Take 25 mg by mouth 2 (two) times daily. 07/10/18  Yes [provider]  Multiple Vitamins-Minerals (THERA-M) TABS Take 1 tablet by mouth daily.   Yes [provider]  nitroGLYCERIN  (NITROSTAT ) 0.4 MG SL tablet Place 0.4 mg under the tongue every 5 (five) minutes as needed for chest pain.   Yes [provider]  omeprazole (PRILOSEC) 20 MG capsule Take 40 mg by mouth daily.   Yes [provider]  polyethylene glycol (MIRALAX  / GLYCOLAX ) 17 g packet Take 17 g by mouth daily as needed. Patient taking differently: Take 17 g by mouth daily as needed for mild constipation. 04/28/22  Yes Gwendalyn Lemma, MD  potassium chloride  (KLOR-CON ) 10 MEQ tablet Take 1 tablet (10 mEq total) by mouth daily. Take While taking Lactulose  Patient taking differently: Take 10 mEq by mouth daily. Take While taking Furosemide  01/28/22  Yes Emokpae, Courage, MD  Pramox-PE-Glycerin -Petrolatum (HEMORRHOIDAL) 1-0.25-14.4-15 % CREA Place 1 g rectally daily as needed (hemorrhoids).   Yes [provider]  rosuvastatin (CRESTOR) 10 MG tablet Take 1 tablet by mouth daily.   Yes [provider]  senna-docusate (SENOKOT-S) 8.6-50 MG tablet Take 1 tablet by mouth 2 (two) times daily. 04/28/22  Yes Gwendalyn Lemma, MD  Throat Lozenges (HALLS COUGH DROPS MT) Use as directed 1 lozenge in the mouth or throat 4 (four) times daily as needed (cough). Halls Honey   Yes [provider]  traMADol  (ULTRAM ) 50 MG tablet Take 50 mg by mouth 2 (two) times daily. 1000 and 1700   Yes [provider]     Critical care time: ***

## 2024-02-27 NOTE — Progress Notes (Signed)
 SLP Cancellation Note  Patient Details Name: Heather [name] MRN: [id number] DOB: 10-29-1930   Cancelled treatment:  Pt NPO for pending transfer to [facility] and potential procedure.           Alston Jerry 02/27/2024, 4:58 PM

## 2024-02-27 NOTE — H&P (Addendum)
 History and Physical    Yilda Bandemer WUJ:[id number] DOB: [birth year] DOA: 02/27/2024  PCP: Orlena Bitters, MD   Patient coming from: ALF Chief Complaint  Patient presents with   Shortness of Breath     HPI: 89 year old female with history of anxiety, GERD, HLD, HTN, HLD, IBS, compression fracture of L1, NASH cirrhosis, arthritis/debility previous history of respiratory failure in 2023, History of ?COPD on  Breo, previous admission on 02/24/2023 for COPD exacerbation/pneumonia  Presented to the ED from [place] [facility] [place] with generalized weakness, shortness of breath since last Sunday.On presentation hypoxia 80% on RA-placed on supplemental oxygen  up to 7 L, In the ED patient felt to be poor historian, tachypneic up to 30 BP stable.  Labs showed ABG PCO238 PO2.4PO 266, mild hyponatremia ammonia 37 BNP 179 leukocytosis 19.7. CXR> mild congestive changes with small bilateral pleural effusions.Aaron Aas  COVID RSV flu negative Patient was given Solu-Medrol  125, DuoNeb IV Lasix  and admission was requested. D-dimer pending> came up at 1.3 On exam now she feels much better on 7l [place] poor historian, able to speak in full sentence and not In distress At ALF was having shortness of breath  but does say having bilateral chest pain for few months and rt leg pain.   Assessment/Plan Principal Problem:   Acute hypoxic respiratory failure (HCC)  Acute hypoxic resp failure ?COPD exacerbation-but not a smoker Suspected CHF w/ acute exacerbation: No formal diagnosis COPD apparently wheezing at the facility, on Breo at baseline, previous admission for?  COPD and pneumonia in 2024. Chest x-ray appeared congested with small pleural effusion.Received Solu-Medrol  , Lasix  and supplemental oxygen  and feeling much better  D-dimer at 1.3- in ED gettting CTA as she has some chest wall pain bilaterally for months. CTA reviewed> "New right middle and lower lobe collapse with associated volume loss and endobronchial  opacification. There is an oval hyperdenseendobronchial component within the bronchus intermedius which could reflect an aspirated foreign body or a bronchial artery aneurysm" I consulted and discussed with PCCM Allison Ivory pulmonary evaluation in case he needs to be transferred to [facility] for bronch further eval versus further repeat CTA here. Slp eval, Pulmicort Brovana and Yupelri added IS/Flutter valve.  She has been eating well laying on the bed certainly at risk of aspiration Daughter updated at bedside at 2.30pm and waiting on Pulm eval. Informed by Rice Chamorro w/ PCCM- to transfer to [facility] as she will need bronchoscopy, pulmonary will evaluate at [facility]  Concern for acute on chronic diastolic CHF: Previous echo in 2023-Last echo 65-70%,mild lvh, G1DD.Got Lasix  in the ED await repeat echo hold off on lasix  for now  Rt leg pain: Checking duplex rt leg  Leukocytosis: No obvious fever chest x-ray no obvious pneumonia, check UA,, procalcitonin, monitor fever if has fever will start antibiotics otherwise follow-up  GERD: Cont ppi  HTN HLD: BP stable  NASH cirrhosis IBS: Ammonia slightly up. Avoid constipation, cont prn miralax   Dementia Debility Arthritis: Tylenol  prn supportive care, delirium precaution. PMT CONSULTED FOR goc. Code status. As per discussion patient has not been doing very well not been much mobile and has been eating laying in the bed. Overall prognosis guarded  Severity of Illness: The appropriate patient status for this patient is INPATIENT. Inpatient status is judged to be reasonable and necessary in order to provide the required intensity of service to ensure the patient's safety. The patient's presenting symptoms, physical exam findings, and initial radiographic and laboratory data in the context of their chronic comorbidities  is felt to place them at high risk for further clinical deterioration. Furthermore, it is not anticipated that the patient will be  medically stable for discharge from the hospital within 2 midnights of admission.   * I certify that at the point of admission it is my clinical judgment that the patient will require inpatient hospital care spanning beyond 2 midnights from the point of admission due to high intensity of service, high risk for further deterioration and high frequency of surveillance required.*   DVT prophylaxis: enoxaparin  (LOVENOX ) injection 40 mg Start: 02/27/24 1230  Code Status:   Code Status: Full Code discussed with patient Family Communication: Admission, patients condition and plan of care including tests being ordered have been discussed with the patient who indicate understanding and agree with the plan and Code Status.  Consults called:  none  Review of Systems: All systems were reviewed and were negative except as mentioned in HPI above. Negative for fever Negative for chest pain Negative for shortness of breath  Past Medical History:  Diagnosis Date   Anxiety    GERD (gastroesophageal reflux disease)    Hx of shoulder surgery    Left Shoulder - pitched nerve.   Other and unspecified hyperlipidemia    Unspecified essential hypertension     Past Surgical History:  Procedure Laterality Date   BIOPSY  04/09/2020   Procedure: BIOPSY;  Surgeon: Ruby Corporal, MD;  Location: [facility];  Service: Endoscopy;;   CERVICAL DISC SURGERY     CESAREAN SECTION  1970   colonscopy  08/22/2012   moderate diverticulosos in sigmoid and two tubular adenomas   ESOPHAGOGASTRODUODENOSCOPY N/A 04/09/2020   Rehman:Normal hypopharynx.normal esophagus, z line irreg, 38 cm from incisors, erythematous mucosa in antrum and preplyoric region, gastric antral mucosa with non specific reactive gastropathy, normal duodenal bul and second portion of duodenum   History of shoulder surgery Left    pitched nerve.     reports that she has never smoked. She has never used smokeless tobacco. She reports that she  does not drink alcohol  and does not use drugs.  No Known Allergies  Family History  Problem Relation Age of Onset   Hypertension Other    Heart attack Sister      Prior to Admission medications   Medication Sig Start Date End Date Taking? Authorizing Provider  acetaminophen  (TYLENOL ) 500 MG tablet Take 500 mg by mouth every 4 (four) hours as needed for mild pain, moderate pain or fever.    [provider]  albuterol  (VENTOLIN  HFA) 108 (90 Base) MCG/ACT inhaler Inhale 2 puffs into the lungs every 4 (four) hours as needed for wheezing or shortness of breath. 01/28/22   Colin Dawley, MD  ALPRAZolam  (XANAX ) 0.5 MG tablet Take 2 tablets (1 mg total) by mouth 3 (three) times daily. 04/28/22   Gwendalyn Lemma, MD  amLODipine  (NORVASC ) 10 MG tablet Take 10 mg by mouth every morning.  07/14/17   [provider]  aspirin  81 MG tablet Take 1 tablet (81 mg total) by mouth daily with breakfast. 01/28/22   Colin Dawley, MD  Calcium -Magnesium-Zinc (CAL-MAG-ZINC PO) Take 1 tablet by mouth daily.    [provider]  Diclofenac Sodium 3 % GEL Apply 1-2 g topically in the morning, at noon, and at bedtime.    [provider]  doxycycline  (VIBRAMYCIN ) 100 MG capsule Take 1 capsule (100 mg total) by mouth 2 (two) times daily. 04/09/23   Spence Dux, PA-C  ferrous sulfate  325 (65 FE) MG tablet Take 325 mg by mouth daily with breakfast.    [provider]  fluticasone  furoate-vilanterol (BREO ELLIPTA ) 100-25 MCG/ACT AEPB Inhale 1 puff into the lungs daily. 03/04/20   [provider]  furosemide  (LASIX ) 40 MG tablet Take 40 mg by mouth daily.    [provider]  gabapentin  (NEURONTIN ) 300 MG capsule Take 300 mg by mouth daily. 01/17/22   [provider]  HYDROcodone -acetaminophen  (NORCO/VICODIN) 5-325 MG tablet Take 1 tablet by mouth every 6 (six) hours as needed for moderate pain. 04/28/22   Gwendalyn Lemma, MD  isosorbide  mononitrate (IMDUR ) 60  MG 24 hr tablet Take 60 mg by mouth daily. 01/17/22   [provider]  lactulose  (CHRONULAC ) 10 GM/15ML solution Take 30 mLs (20 g total) by mouth 2 (two) times daily. Goal is to Have at least 2 mushy/Non-solid bowel movements Each day-- May give 1 additional dose of lactulose  if no bowel movement in 24 hr period Patient taking differently: Take 20 g by mouth 2 (two) times daily. 01/28/22   Colin Dawley, MD  lidocaine  (LIDODERM ) 5 % Place 1 patch onto the skin daily. Remove & Discard patch within 12 hours or as directed by MD 04/28/22   Gwendalyn Lemma, MD  metoprolol  tartrate (LOPRESSOR ) 25 MG tablet Take 25 mg by mouth 2 (two) times daily. 07/10/18   [provider]  Naphazoline-Pheniramine (OPCON-A) 0.027-0.315 % SOLN Place 1 drop into both eyes 2 (two) times daily as needed (dry eyes).    [provider]  nitroGLYCERIN  (NITROSTAT ) 0.4 MG SL tablet Place 0.4 mg under the tongue every 5 (five) minutes as needed for chest pain.    [provider]  omeprazole (PRILOSEC) 40 MG capsule Take 40 mg by mouth daily.    [provider]  polyethylene glycol (MIRALAX  / GLYCOLAX ) 17 g packet Take 17 g by mouth daily as needed. 04/28/22   Gwendalyn Lemma, MD  potassium chloride  (KLOR-CON ) 10 MEQ tablet Take 1 tablet (10 mEq total) by mouth daily. Take While taking Lactulose  01/28/22   Colin Dawley, MD  QUEtiapine  (SEROQUEL ) 25 MG tablet Take 0.5 tablets (12.5 mg total) by mouth at bedtime. 04/29/22   Gwendalyn Lemma, MD  senna-docusate (SENOKOT-S) 8.6-50 MG tablet Take 1 tablet by mouth 2 (two) times daily. 04/28/22   Gwendalyn Lemma, MD  sucralfate (CARAFATE) 1 g tablet Take 1 g by mouth 4 (four) times daily. 01/17/22   [provider]    Physical Exam: Vitals:   02/27/24 1030 02/27/24 1050 02/27/24 1053 02/27/24 1145  BP:    131/69  Pulse: 79  76 86  Resp: 20  (!) 30 (!) 25  Temp:      TempSrc:      SpO2: (!) 89% 94% 95% 94%   General exam: AA pleasant,  on 7l [place], NAD, weak appearing. HEENT:Oral mucosa moist, Ear/Nose WNL grossly, dentition normal. Respiratory system: bilaterally clear air entry,no use of accessory muscle Cardiovascular system: S1 & S2 +, No JVD,. Gastrointestinal system: Abdomen soft, NT,ND, BS+ Nervous System:Alert, awake, moving extremities and grossly nonfocal Extremities:mild leg edema, distal peripheral pulses palpable.  Skin: No rashes,no icterus. MSK: Normal muscle bulk,tone, power   Labs on Admission: I have personally reviewed following labs and imaging studies  CBC: Recent Labs  Lab 02/27/24 1049  WBC 19.7*  HGB 13.2  HCT 39.4  MCV 91.6  PLT 157   Basic Metabolic Panel: Recent Labs  Lab 02/27/24 1049  NA 133*  K 4.0  CL 100  CO2 21*  GLUCOSE 146*  BUN 15  CREATININE 0.99  CALCIUM  8.8*   CrCl cannot be calculated (Unknown ideal weight.). No results for input(s): "AST", "ALT", "ALKPHOS", "BILITOT", "PROT", "ALBUMIN" in the last 168 hours. No results for input(s): "LIPASE", "AMYLASE" in the last 168 hours. Recent Labs  Lab 02/27/24 1049  AMMONIA 37*   Thyroid  Function Tests: No results for input(s): "TSH", "T4TOTAL", "FREET4", "T3FREE", "THYROIDAB" in the last 72 hours. Urine analysis:    Component Value Date/Time   COLORURINE YELLOW 04/09/2023 1700   APPEARANCEUR HAZY (A) 04/09/2023 1700   LABSPEC 1.016 04/09/2023 1700   PHURINE 5.0 04/09/2023 1700   GLUCOSEU NEGATIVE 04/09/2023 1700   HGBUR NEGATIVE 04/09/2023 1700   BILIRUBINUR NEGATIVE 04/09/2023 1700   KETONESUR NEGATIVE 04/09/2023 1700   PROTEINUR NEGATIVE 04/09/2023 1700   NITRITE NEGATIVE 04/09/2023 1700   LEUKOCYTESUR NEGATIVE 04/09/2023 1700    Radiological Exams on Admission: DG Chest Port 1 View Result Date: 02/27/2024 CLINICAL DATA:  Shortness of breath EXAM: PORTABLE CHEST 1 VIEW COMPARISON:  January 27, 2022 FINDINGS: The heart size and mediastinal contours are within normal limits. Both lungs are clear. The visualized  skeletal structures are unremarkable. Mild equalization flow towards the apices and interstitial prominence could correlate with subtle congestive changes with a small bilateral pleural reactions IMPRESSION: Mild congestive changes with small bilateral pleural reactions. Electronically Signed   By: Fredrich Jefferson M.D.   On: 02/27/2024 10:53      Lesa Rape MD [facility]  If 7PM-7AM, please contact night-coverage [email or web address]  02/27/2024, 12:24 PM

## 2024-02-27 NOTE — Hospital Course (Addendum)
 89 year old female with history of anxiety, GERD, HLD, HTN, HLD, IBS, compression fracture of L1, NASH cirrhosis, arthritis/debility previous history of respiratory failure in 2023, History of ?COPD on  Breo, previous admission on 02/24/2023 for COPD exacerbation/pneumonia  Presented to the ED from [place] [facility] [place] with generalized weakness, shortness of breath since last Sunday.On presentation hypoxia 80% on RA-placed on supplemental oxygen  up to 7 L, In the ED patient felt to be poor historian, tachypneic up to 30 BP stable.  Labs showed ABG PCO238 PO2.4PO 266, mild hyponatremia ammonia 37 BNP 179 leukocytosis 19.7. CXR> mild congestive changes with small bilateral pleural effusions.Aaron Aas  COVID RSV flu negative Patient was given Solu-Medrol  125, DuoNeb IV Lasix  and admission was requested. D-dimer pending> came up at 1.3 On exam now she feels much better on 7l [place] poor historian, able to speak in full sentence and not In distress At ALF was having shortness of breath  but does say having bilateral chest pain for few months and rt leg pain.

## 2024-02-27 NOTE — Consult Note (Signed)
 Consultation Note Date: 02/27/2024   Patient Name: Heather [name]  DOB: 1931/01/29  MRN: [id number]  Age / Sex: 89 y.o., female  PCP: Orlena Bitters, MD Referring Physician: Lesa Rape, MD  Reason for Consultation: Establishing goals of care  HPI/Patient Profile: 89 y.o. female  with past medical history of anxiety, GERD, HLD, HTN, HLD, IBS, compression fracture of L1, NASH cirrhosis, arthritis/debility previous history of respiratory failure in 2023, History of ?COPD on Breo, previous admission on 02/24/2023 for COPD exacerbation/pneumonia admitted on 02/27/2024 with acute hypoxic respiratory failure, questionable COPD exacerbation, suspected CHF exacerbation, CT concerning for aspirated foreign body or bronchial artery aneurysm, pulmonary consulted.   Clinical Assessment and Goals of Care: I have reviewed medical records including EPIC notes, labs and imaging, received report from RN, assessed the patient.  Heather [name] is lying quietly in bed.  She appears acutely ill and frail, obese.  She is resting comfortably, but wakes when I call her name.  She will make an somewhat keep eye contact.  She is able to mumble her name only.  She is able to tell me that her daughter, Champ Coma, is present at bedside.  I share that I will talk with her daughter, and she is agreeable.  We meet at the bedside to discuss diagnosis prognosis, GOC, EOL wishes, disposition and options.  I introduced Palliative Medicine as specialized medical care for people living with serious illness. It focuses on providing relief from the symptoms and stress of a serious illness. The goal is to improve quality of life for both the patient and the family.  We discussed a brief life review of the patient.  Heather [name] has been a widow for 10 years.  She has 5 children.  She worked at [employer], then [employer], then was not ready to retire  so she did some part-time work at [employer].  She left [employer] when her husband became ill and cared for him.  She has lived at [facility] for approximately 2 years.  Ask about memory loss, fawn states that Heather [name] is 100% some days, but other days she is talking to her deceased parents.  She tells me that over the last 3 weeks, staff has been having a hard time getting Heather [name] out of bed.  She is eating and drinking in the bed, at times leaning over.  We then focused on their current illness.  We talked about respiratory status and time for outcomes.  We talked about stepdown unit level of care.  We talked about CT results with possible aspiration.  The natural disease trajectory and expectations at EOL were discussed.  Advanced directives, concepts specific to code status, artifical feeding and hydration, and rehospitalization were considered and discussed.  We talked about the concept of "treat the treatable, but allow a natural passing".  Jacquelene Mathieu states that her mother chose full scope/full code several years ago during a discussion with her family doctor.  I ask how long she would want life support, 2 days,  2 weeks?  Jacquelene Mathieu states that she and her mother have not discussed this.  Discussed the importance of continued conversation with family and the medical providers regarding overall plan of care and treatment options, ensuring decisions are within the context of the patient's values and GOCs. Questions and concerns were addressed.  Hard Choices booklet left for review. The family was encouraged to call with questions or concerns.  PMT will continue to support holistically.  Conference with attending, bedside nursing staff, related to patient condition, needs, goals of care, disposition.    HCPOA HCPOA -daughter, [name] Roche.  Heather [name] is a widow.  She has 5 children.  Jacquelene Mathieu states that she will discuss choices with her siblings.    SUMMARY OF RECOMMENDATIONS   Full  scope/full code Unable to set limits on life support at this time. Time for outcomes   Code Status/Advance Care Planning: Full code - We talked about the concept of "treat the treatable, but allow a natural passing".  Jacquelene Mathieu states that her mother chose full scope/full code several years ago during a discussion with her family doctor.  I ask how long she would want life support, 2 days, 2 weeks?  Jacquelene Mathieu states that she and her mother have not discussed this.  Symptom Management:  Per hospitalist, no additional needs at this time.  Palliative Prophylaxis:  Frequent Pain Assessment, Oral Care, and Turn Reposition  Additional Recommendations (Limitations, Scope, Preferences): Full Scope Treatment  Psycho-social/Spiritual:  Desire for further Chaplaincy support:no Additional Recommendations: Caregiving  Support/Resources  Prognosis:  Unable to determine, based on outcomes.  Guarded at this point.  Discharge Planning: To Be Determined      Primary Diagnoses: Present on Admission:  Acute hypoxic respiratory failure (HCC)   I have reviewed the medical record, interviewed the patient and family, and examined the patient. The following aspects are pertinent.  Past Medical History:  Diagnosis Date   Anxiety    GERD (gastroesophageal reflux disease)    Hx of shoulder surgery    Left Shoulder - pitched nerve.   Other and unspecified hyperlipidemia    Unspecified essential hypertension    Social History   Socioeconomic History   Marital status: Widowed    Spouse name: Not on file   Number of children: Not on file   Years of education: Not on file   Highest education level: Not on file  Occupational History   Not on file  Tobacco Use   Smoking status: Never   Smokeless tobacco: Never  Vaping Use   Vaping status: Never Used  Substance and Sexual Activity   Alcohol  use: No    Alcohol /week: 0.0 standard drinks of alcohol    Drug use: No   Sexual activity: Not on file  Other  Topics Concern   Not on file  Social History Narrative   Not on file   Social Drivers of Health   Financial Resource Strain: Low Risk  (02/27/2023)   Received [facility], [facility]   Overall Financial Resource Strain (CARDIA)    Difficulty of Paying Living Expenses: Not hard at all  Food Insecurity: No Food Insecurity (02/27/2023)   [facility], [facility]   Hunger Vital Sign    Worried About Running Out of Food in the Last Year: Never true    Ran Out of Food in the Last Year: Never true  Transportation Needs: No Transportation Needs (02/27/2023)   [facility] from [facility], [facility]  Care   PRAPARE - Transportation    Lack of Transportation (Medical): No    Lack of Transportation (Non-Medical): No  Physical Activity: Not on file  Stress: Not on file  Social Connections: Not on file   Family History  Problem Relation Age of Onset   Hypertension Other    Heart attack Sister    Scheduled Meds:  albuterol   5 mg/hr Nebulization Once   arformoterol  15 mcg Nebulization BID   [START ON 02/28/2024] aspirin   81 mg Oral Q breakfast   budesonide (PULMICORT) nebulizer solution  0.25 mg Nebulization BID   [START ON 02/28/2024] Chlorhexidine Gluconate Cloth  6 each Topical Q0600   enoxaparin  (LOVENOX ) injection  40 mg Subcutaneous Q24H   [START ON 02/28/2024] ferrous sulfate  325 mg Oral Q breakfast   [START ON 02/28/2024] furosemide   20 mg Intravenous Daily   [START ON 02/28/2024] isosorbide  mononitrate  60 mg Oral Daily   methylPREDNISolone  (SOLU-MEDROL ) injection  40 mg Intravenous Q12H   metoprolol  tartrate  25 mg Oral BID   pantoprazole   40 mg Oral Daily   QUEtiapine   12.5 mg Oral QHS   [START ON 02/28/2024] revefenacin  175 mcg Nebulization Daily   Continuous Infusions: PRN Meds:.acetaminophen  **OR** acetaminophen , albuterol , ondansetron  **OR** ondansetron  (ZOFRAN ) IV, polyethylene glycol Medications Prior to Admission:  Prior to Admission  medications   Medication Sig Start Date End Date Taking? Authorizing Provider  acetaminophen  (TYLENOL ) 500 MG tablet Take 500 mg by mouth every 4 (four) hours as needed for mild pain, moderate pain or fever.    [provider]  albuterol  (VENTOLIN  HFA) 108 (90 Base) MCG/ACT inhaler Inhale 2 puffs into the lungs every 4 (four) hours as needed for wheezing or shortness of breath. 01/28/22   Colin Dawley, MD  ALPRAZolam  (XANAX ) 0.5 MG tablet Take 2 tablets (1 mg total) by mouth 3 (three) times daily. 04/28/22   Gwendalyn Lemma, MD  amLODipine  (NORVASC ) 10 MG tablet Take 10 mg by mouth every morning.  07/14/17   [provider]  aspirin  81 MG tablet Take 1 tablet (81 mg total) by mouth daily with breakfast. 01/28/22   Colin Dawley, MD  Calcium -Magnesium-Zinc (CAL-MAG-ZINC PO) Take 1 tablet by mouth daily.    [provider]  Diclofenac Sodium 3 % GEL Apply 1-2 g topically in the morning, at noon, and at bedtime.    [provider]  doxycycline  (VIBRAMYCIN ) 100 MG capsule Take 1 capsule (100 mg total) by mouth 2 (two) times daily. 04/09/23   Spence Dux, PA-C  ferrous sulfate 325 (65 FE) MG tablet Take 325 mg by mouth daily with breakfast.    [provider]  fluticasone  furoate-vilanterol (BREO ELLIPTA ) 100-25 MCG/ACT AEPB Inhale 1 puff into the lungs daily. 03/04/20   [provider]  furosemide  (LASIX ) 40 MG tablet Take 40 mg by mouth daily.    [provider]  gabapentin  (NEURONTIN ) 300 MG capsule Take 300 mg by mouth daily. 01/17/22   [provider]  HYDROcodone -acetaminophen  (NORCO/VICODIN) 5-325 MG tablet Take 1 tablet by mouth every 6 (six) hours as needed for moderate pain. 04/28/22   Gwendalyn Lemma, MD  isosorbide  mononitrate (IMDUR ) 60 MG 24 hr tablet Take 60 mg by mouth daily. 01/17/22   [provider]  lactulose  (CHRONULAC ) 10 GM/15ML solution Take 30 mLs (20 g total) by mouth 2 (two) times daily. Goal is to Have  at least 2 mushy/Non-solid bowel movements Each day-- May give 1 additional dose of  lactulose  if no bowel movement in 24 hr period Patient taking differently: Take 20 g by mouth 2 (two) times daily. 01/28/22   Colin Dawley, MD  lidocaine  (LIDODERM ) 5 % Place 1 patch onto the skin daily. Remove & Discard patch within 12 hours or as directed by MD 04/28/22   Gwendalyn Lemma, MD  metoprolol  tartrate (LOPRESSOR ) 25 MG tablet Take 25 mg by mouth 2 (two) times daily. 07/10/18   [provider]  Naphazoline-Pheniramine (OPCON-A) 0.027-0.315 % SOLN Place 1 drop into both eyes 2 (two) times daily as needed (dry eyes).    [provider]  nitroGLYCERIN  (NITROSTAT ) 0.4 MG SL tablet Place 0.4 mg under the tongue every 5 (five) minutes as needed for chest pain.    [provider]  omeprazole (PRILOSEC) 40 MG capsule Take 40 mg by mouth daily.    [provider]  polyethylene glycol (MIRALAX  / GLYCOLAX ) 17 g packet Take 17 g by mouth daily as needed. 04/28/22   Gwendalyn Lemma, MD  potassium chloride  (KLOR-CON ) 10 MEQ tablet Take 1 tablet (10 mEq total) by mouth daily. Take While taking Lactulose  01/28/22   Colin Dawley, MD  QUEtiapine  (SEROQUEL ) 25 MG tablet Take 0.5 tablets (12.5 mg total) by mouth at bedtime. 04/29/22   Gwendalyn Lemma, MD  senna-docusate (SENOKOT-S) 8.6-50 MG tablet Take 1 tablet by mouth 2 (two) times daily. 04/28/22   Gwendalyn Lemma, MD  sucralfate (CARAFATE) 1 g tablet Take 1 g by mouth 4 (four) times daily. 01/17/22   [provider]   No Known Allergies Review of Systems  Unable to perform ROS: Age    Physical Exam Vitals and nursing note reviewed.  Constitutional:      Appearance: She is obese. She is ill-appearing.  Skin:    General: Skin is warm and dry.     Vital Signs: BP (!) 122/95   Pulse 86   Temp (P) 99.5 F (37.5 C) (Oral)   Resp (!) 25   SpO2 94%          SpO2: SpO2: 94 % O2 Device:SpO2: 94 % O2 Flow Rate: .O2  Flow Rate (L/min): 5 L/min  IO: Intake/output summary: No intake or output data in the 24 hours ending 02/27/24 1349  LBM:   Baseline Weight:   Most recent weight:       Palliative Assessment/Data:     Time In: 1300 Time Out: 1415 Time Total: 75 minutes  Greater than 50%  of this time was spent counseling and coordinating care related to the above assessment and plan.  Signed by: Annabelle Barrack, NP   Please contact Palliative Medicine Team phone at [phone] for questions and concerns.  For individual provider: See Tilford Foley

## 2024-02-28 ENCOUNTER — Inpatient Hospital Stay (HOSPITAL_COMMUNITY)

## 2024-02-28 DIAGNOSIS — K7581 Nonalcoholic steatohepatitis (NASH): Secondary | ICD-10-CM

## 2024-02-28 DIAGNOSIS — K2100 Gastro-esophageal reflux disease with esophagitis, without bleeding: Secondary | ICD-10-CM

## 2024-02-28 DIAGNOSIS — I10 Essential (primary) hypertension: Secondary | ICD-10-CM

## 2024-02-28 DIAGNOSIS — K7460 Unspecified cirrhosis of liver: Secondary | ICD-10-CM

## 2024-02-28 DIAGNOSIS — J9601 Acute respiratory failure with hypoxia: Secondary | ICD-10-CM | POA: Diagnosis not present

## 2024-02-28 DIAGNOSIS — I5031 Acute diastolic (congestive) heart failure: Secondary | ICD-10-CM

## 2024-02-28 LAB — BASIC METABOLIC PANEL WITH GFR
Anion gap: 13 (ref 5–15)
BUN: 24 mg/dL — ABNORMAL HIGH (ref 8–23)
CO2: 23 mmol/L (ref 22–32)
Calcium: 9 mg/dL (ref 8.9–10.3)
Chloride: 97 mmol/L — ABNORMAL LOW (ref 98–111)
Creatinine, Ser: 0.97 mg/dL (ref 0.44–1.00)
GFR, Estimated: 56 mL/min — ABNORMAL LOW (ref >60)
Glucose, Bld: 172 mg/dL — ABNORMAL HIGH (ref 70–99)
Potassium: 3.4 mmol/L — ABNORMAL LOW (ref 3.5–5.1)
Sodium: 133 mmol/L — ABNORMAL LOW (ref 135–145)

## 2024-02-28 LAB — CBC
HCT: 40.7 % (ref 36.0–46.0)
Hemoglobin: 13.8 g/dL (ref 12.0–15.0)
MCH: 31.5 pg (ref 26.0–34.0)
MCHC: 33.9 g/dL (ref 30.0–36.0)
MCV: 92.9 fL (ref 80.0–100.0)
Platelets: 143 10*3/uL — ABNORMAL LOW (ref 150–400)
RBC: 4.38 MIL/uL (ref 3.87–5.11)
RDW: 12.5 % (ref 11.5–15.5)
WBC: 14 10*3/uL — ABNORMAL HIGH (ref 4.0–10.5)
nRBC: 0 % (ref 0.0–0.2)

## 2024-02-28 LAB — ECHOCARDIOGRAM COMPLETE
AR max vel: 1.99 cm2
AV Area VTI: 2.54 cm2
AV Area mean vel: 2.06 cm2
AV Mean grad: 4 mmHg
AV Peak grad: 7.5 mmHg
Ao pk vel: 1.37 m/s
Area-P 1/2: 3.87 cm2
Calc EF: 65.8 %
Height: 67 in
MV VTI: 2.27 cm2
S' Lateral: 2.8 cm
Single Plane A2C EF: 77.2 %
Single Plane A4C EF: 62 %
Weight: 3107.6 [oz_av]

## 2024-02-28 MED ORDER — ALPRAZOLAM 0.5 MG PO TABS
1.0000 mg | ORAL_TABLET | Freq: Two times a day (BID) | ORAL | Status: DC
Start: 2024-02-28 — End: 2024-03-04
  Administered 2024-02-28 – 2024-03-04 (×11): 1 mg via ORAL
  Filled 2024-02-28 (×11): qty 2

## 2024-02-28 NOTE — Progress Notes (Signed)
 Heart Failure Navigator Progress Note  Assessed for Heart & Vascular TOC clinic readiness.  Patient does not meet criteria due to history of Dementia per MD, last EF 65-70%. No HF TOC.   Navigator will sign off at this time.   Randie Bustle, BSN, Scientist, clinical (histocompatibility and immunogenetics) Only

## 2024-02-28 NOTE — TOC Initial Note (Signed)
 Transition of Care [facility]) - Initial/Assessment Note   Patient Details  Name: Heather [name] MRN: [id number] Date of Birth: 1931/05/27  [facility]) CM/SW Contact:    Zenon Hilda, LCSW Phone Number: 02/28/2024, 11:01 AM  Clinical Narrative: Patient is from [facility]. Patient is currently oriented to self only. CSW attempted to reach patient's daughter, Jacquelene Mathieu Roche, but had to leave a voicemail requesting call back. TOC consulted for heart failure screening, but patient does not meet criteria so the heart failure navigation team signed off. Consult to be cleared at this time.  CSW called [facility] and spoke with [employer]. Per Mr. Annalee Barren, patient will need to be assessed by the facility in-person prior to returning and a discharge summary and FL2 can be emailed to [email or web address]. TOC to follow.  Expected Discharge Plan: Assisted Living ([facility]) Barriers to Discharge: Continued Medical Work up  Patient Goals and CMS Choice Patient states their goals for this hospitalization and ongoing recovery are:: Patient oriented to self only  Expected Discharge Plan and Services In-house Referral: Clinical Social Work Living arrangements for the past 2 months: Assisted Living Facility              DME Arranged: N/A DME Agency: NA  Prior Living Arrangements/Services Living arrangements for the past 2 months: Assisted Living Facility Lives with:: Facility Resident Patient language and need for interpreter reviewed:: Yes Do you feel safe going back to the place where you live?: Yes      Need for Family Participation in Patient Care: Yes (Comment) (Patient oriented to self only.) Care giver support system in place?: Yes (comment) Criminal Activity/Legal Involvement Pertinent to Current Situation/Hospitalization: No - Comment as needed  Activities of Daily Living ADL Screening (condition at time of  admission) Independently performs ADLs?: No Does the patient have a NEW difficulty with bathing/dressing/toileting/self-feeding that is expected to last >3 days?: No Does the patient have a NEW difficulty with getting in/out of bed, walking, or climbing stairs that is expected to last >3 days?: No Does the patient have a NEW difficulty with communication that is expected to last >3 days?: No Is the patient deaf or have difficulty hearing?: Yes Does the patient have difficulty seeing, even when wearing glasses/contacts?: No Does the patient have difficulty concentrating, remembering, or making decisions?: Yes  Emotional Assessment Orientation: : Oriented to Self Alcohol  / Substance Use: Not Applicable Psych Involvement: No (comment)  Admission diagnosis:  Acute respiratory failure with hypoxia (HCC) [J96.01] Acute hypoxic respiratory failure (HCC) [J96.01] Patient Active Problem List   Diagnosis Date Noted   Acute hypoxic respiratory failure (HCC) 02/27/2024   Abnormal CT of the abdomen 04/26/2022   Chronic back pain 04/26/2022   Closed compression fracture of body of L1 vertebra (HCC) 04/25/2022   Pressure injury of skin 04/25/2022   Acute respiratory failure with hypoxia (HCC) 04/25/2022   Low back pain 02/09/2022   Aspiration pneumonia (HCC) 01/27/2022   Elevated troponin 01/27/2022   Sepsis (HCC) 01/27/2022   Multifocal pneumonia 01/27/2022   Liver cirrhosis secondary to NASH (HCC) 11/03/2021   IBS (irritable bowel syndrome) 08/20/2020   Nausea with vomiting 06/18/2020   Anorexia 11/11/2019   Abdominal pain 08/12/2019   Constipation 08/12/2019   Rash and nonspecific skin eruption 08/12/2019   Elevated LFTs 08/12/2019   Carpal tunnel syndrome, right upper limb 08/31/2018   Essential hypertension 06/09/2018   Anxiety 06/09/2018   GERD (gastroesophageal reflux disease)  06/09/2018   Palpitations 04/22/2012   Chest pain 04/22/2012   Coronary artery disease excluded 04/22/2012    Insomnia 04/22/2012   PCP:  Orlena Bitters, MD Pharmacy:  No Pharmacies Listed  Social Drivers of Health (SDOH) Social History: SDOH Screenings   Food Insecurity: No Food Insecurity (02/27/2024)  Housing: Low Risk  (02/27/2024)  Transportation Needs: No Transportation Needs (02/27/2024)  Utilities: Not At Risk (02/27/2024)  Financial Resource Strain: Low Risk  (02/27/2023)   [facility] from [facility], [facility]  Social Connections: Moderately Isolated (02/27/2024)  Tobacco Use: Low Risk  (02/27/2024)   SDOH Interventions:    Readmission Risk Interventions    04/28/2022   11:17 AM  Readmission Risk Prevention Plan  Transportation Screening Complete  PCP or Specialist Appt within 5-7 Days Complete  Home Care Screening Complete  Medication Review (RN CM) Complete

## 2024-02-28 NOTE — Progress Notes (Signed)
 [facility]                                                                              Heather [name], is a 89 y.o. female, DOB - 1931/01/31, ZOX:[id number] Admit date - 02/27/2024    Outpatient Primary MD for the patient is [name], [name], MD  LOS - 1  days  Chief Complaint  Patient presents with   Shortness of Breath       Brief summary   Patient is a 89 year old female with anxiety, GERD, HLD, HTN, HLD, IBS, compression fracture of L1, NASH cirrhosis, arthritis/debility previous history of respiratory failure in 2023, History of ?COPD on  Breo, previous admission on 02/24/2023 for COPD exacerbation/pneumonia  Presented to the ED from [place] [facility] [place] with generalized weakness, shortness of breath since last Sunday.On presentation hypoxia 80% on RA-placed on supplemental oxygen  up to 7 L, In the ED patient felt to be poor historian, tachypneic up to 30 BP stable.  Labs showed ABG PCO238 PO2.4PO 266, mild hyponatremia ammonia 37 BNP 179 leukocytosis 19.7. CXR> mild congestive changes with small bilateral pleural effusions.Heather [name]   COVID RSV flu negative Patient was given Solu-Medrol  125, DuoNeb IV Lasix  and admission was requested. D-dimer elevated, CTA chest showed no evidence of acute PE, new right middle and lower lobe collapse with associated volume loss and endobronchial opacification.  There is an oval hyperdense endobronchial component with an the bronchus intermedius which could reflect an aspirated foreign body or bronchial artery aneurysm. PCCM consulted  Assessment & Plan    Principal Problem:   Acute hypoxic respiratory failure (HCC) COPD with acute exacerbation, right middle lobe and lower lobe collapse -Presented with wheezing, shortness of breath, hypoxia - Chest x-ray with congestive changes with right lower lobe hypoventilatory atelectasis - D-dimer elevated, CTA showed new right middle and lower lobe collapse with associated volume loss and  endobronchial opacification - PCCM consult, plan for bronchoscopy tomorrow, 5/8 - SLP evaluation today recommended dysphagia 3 diet, thin liquids - Continue albuterol , Brovana, Pulmicort, IV Solu-Medrol , Yupelri - Follow 2D echo, received Lasix  in the ED, DC IV fluids - N.p.o. after midnight  Active Problems: Right leg pain -Venous Dopplers showed no evidence of DVT in either extremity  Leukocytosis -Improving, procalcitonin less than 0.1, continue to monitor off antibiotics, will follow pulmonology conditions  GERD - Continue PPI    Liver cirrhosis secondary to NASH (HCC) -Ammonia level slightly up, continue MiraLAX     Essential hypertension BP stable   Dementia, debility, arthritis -Palliative medicine consulted for GOC  Obesity class I Estimated body mass index is 30.42 kg/m as calculated from the following:   Height as of this encounter: 5\' 7"  (1.702 m).   Weight as of this encounter: 88.1 kg.  Code Status: Full code DVT Prophylaxis:  enoxaparin  (LOVENOX ) injection 40 mg Start: 02/27/24 2200   Level of Care: Level of care: Progressive Family Communication: Updated patient Disposition Plan:      Remains inpatient appropriate: Plan for bronchoscopy tomorrow   Procedures:    Consultants:   PCCM  Antimicrobials:   Anti-infectives (From admission, onward)    Start  Dose/Rate Route Frequency Ordered Stop   02/27/24 1600  cefTRIAXone  (ROCEPHIN ) 1 g in sodium chloride  0.9 % 100 mL IVPB        1 g 200 mL/hr over 30 Minutes Intravenous Every 24 hours 02/27/24 1430            Medications  albuterol   5 mg/hr Nebulization Once   ALPRAZolam   1 mg Oral BID   arformoterol  15 mcg Nebulization BID   aspirin   81 mg Oral Q breakfast   budesonide (PULMICORT) nebulizer solution  0.25 mg Nebulization BID   Chlorhexidine Gluconate Cloth  6 each Topical Q0600   enoxaparin  (LOVENOX ) injection  40 mg Subcutaneous Q24H   ferrous sulfate  325 mg Oral Q breakfast    furosemide   20 mg Intravenous Daily   isosorbide  mononitrate  60 mg Oral Daily   methylPREDNISolone  (SOLU-MEDROL ) injection  40 mg Intravenous Q12H   metoprolol  tartrate  25 mg Oral BID   mupirocin ointment   Nasal BID   pantoprazole   40 mg Oral Daily   QUEtiapine   12.5 mg Oral QHS   revefenacin  175 mcg Nebulization Daily      Subjective:   Heather [name] was seen and examined today.  No acute complaints.  No chest pain, nausea vomiting, acute shortness of breath.    Objective:   Vitals:   02/28/24 0500 02/28/24 0553 02/28/24 0955 02/28/24 1116  BP:  129/66 (!) 144/79 (!) 110/56  Pulse:  78  83  Resp:  18  18  Temp:  98.4 F (36.9 C)  98.3 F (36.8 C)  TempSrc:  Oral  Oral  SpO2:  91% 92% 95%  Weight: 88.1 kg     Height:        Intake/Output Summary (Last 24 hours) at 02/28/2024 1311 Last data filed at 02/28/2024 1229 Gross per 24 hour  Intake 270 ml  Output 900 ml  Net -630 ml     Wt Readings from Last 3 Encounters:  02/28/24 88.1 kg  04/09/23 78.9 kg  04/25/22 77.9 kg     Exam General: Alert and oriented x 3, NAD Cardiovascular: S1 S2 auscultated,  RRR Respiratory: Diminished breath sound bases Gastrointestinal: Soft, nontender, nondistended, + bowel sounds Ext: no pedal edema bilaterally Neuro: no new deficits Psych: Normal affect     Data Reviewed:  I have personally reviewed following labs    CBC Lab Results  Component Value Date   WBC 14.0 (H) 02/28/2024   RBC 4.38 02/28/2024   HGB 13.8 02/28/2024   HCT 40.7 02/28/2024   MCV 92.9 02/28/2024   MCH 31.5 02/28/2024   PLT 143 (L) 02/28/2024   MCHC 33.9 02/28/2024   RDW 12.5 02/28/2024   LYMPHSABS 2.7 04/09/2023   MONOABS 0.8 04/09/2023   EOSABS 0.3 04/09/2023   BASOSABS 0.1 04/09/2023     Last metabolic panel Lab Results  Component Value Date   NA 133 (L) 02/28/2024   K 3.4 (L) 02/28/2024   CL 97 (L) 02/28/2024   CO2 23 02/28/2024   BUN 24 (H) 02/28/2024   CREATININE 0.97  02/28/2024   GLUCOSE 172 (H) 02/28/2024   GFRNONAA 56 (L) 02/28/2024   GFRAA >60 03/18/2019   CALCIUM  9.0 02/28/2024   PROT 6.6 04/09/2023   ALBUMIN 3.5 04/09/2023   BILITOT 0.2 (L) 04/09/2023   ALKPHOS 49 04/09/2023   AST 35 04/09/2023   ALT 29 04/09/2023   ANIONGAP 13 02/28/2024    CBG (last 3)  No  results for input(s): "GLUCAP" in the last 72 hours.    Coagulation Profile: No results for input(s): "INR", "PROTIME" in the last 168 hours.   Radiology Studies: I have personally reviewed the imaging studies  DG Chest Port 1 View Result Date: 02/28/2024 CLINICAL DATA:  CHF EXAM: PORTABLE CHEST 1 VIEW COMPARISON:  Feb 27, 2024 FINDINGS: Minimal residual bilateral interstitial prominence correlate with subtle residual congestive changes with right lower lobe hypoventilatory atelectasis No pleural effusion Heart and mediastinum normal IMPRESSION: Minimal residual congestive changes with right lower lobe hypoventilatory atelectasis. Electronically Signed   By: Fredrich Jefferson M.D.   On: 02/28/2024 08:39   CT Angio Chest PE W and/or Wo Contrast Result Date: 02/27/2024 CLINICAL DATA:  Pulmonary embolism (PE) suspected, low to intermediate prob, positive D-dimer Generalized weakness with shortness of breath for 2 days. Elevated D-dimer levels. EXAM: CT ANGIOGRAPHY CHEST WITH CONTRAST TECHNIQUE: Multidetector CT imaging of the chest was performed using the standard protocol during bolus administration of intravenous contrast. Multiplanar CT image reconstructions and MIPs were obtained to evaluate the vascular anatomy. RADIATION DOSE REDUCTION: This exam was performed according to the departmental dose-optimization program which includes automated exposure control, adjustment of the mA and/or kV according to patient size and/or use of iterative reconstruction technique. CONTRAST:  75mL OMNIPAQUE  IOHEXOL  350 MG/ML SOLN COMPARISON:  Chest CTA 04/25/2022.  Chest radiographs 02/27/2024. FINDINGS:  Cardiovascular: The pulmonary arteries are well opacified with contrast to the level of the segmental branches. There is no evidence of acute pulmonary embolism. Atherosclerosis of the aorta, great vessels and coronary arteries without acute systemic arterial abnormalities. Stable mild cardiomegaly. No significant pericardial fluid. Mediastinum/Nodes: There are no enlarged mediastinal, hilar or axillary lymph nodes.Small mediastinal lymph nodes are stable, likely reactive. The thyroid  gland and esophagus appear stable, without significant findings. Lungs/Pleura: No pleural effusion or pneumothorax. There is new right middle and lower lobe collapse with associated volume loss and endobronchial opacification. There is an oval hyperdense endobronchial component within the bronchus intermedius which measures up to 1.0 cm on coronal image 91/8. This demonstrates density similar to opacified blood vessels and could reflect an aspirated foreign body or a bronchial artery aneurysm. Mild dependent atelectasis in the left lower lobe, similar to previous study. Upper abdomen: No significant findings in the visualized upper abdomen. Musculoskeletal/Chest wall: There is no chest wall mass or suspicious osseous finding. Chronic T12 compression deformity status post spinal augmentation. Review of the MIP images confirms the above findings. IMPRESSION: 1. No evidence of acute pulmonary embolism. 2. New right middle and lower lobe collapse with associated volume loss and endobronchial opacification. There is an oval hyperdense endobronchial component within the bronchus intermedius which could reflect an aspirated foreign body or a bronchial artery aneurysm. Repeat CT without additional intravenous contrast may be helpful to differentiate these possibilities. 3. Coronary and aortic Atherosclerosis (ICD10-I70.0). Electronically Signed   By: Elmon Hagedorn M.D.   On: 02/27/2024 13:26   US  Venous Img Lower Bilateral (DVT) Result  Date: 02/27/2024 CLINICAL DATA:  Right lower extremity pain. EXAM: BILATERAL LOWER EXTREMITY VENOUS DOPPLER ULTRASOUND TECHNIQUE: Gray-scale sonography with graded compression, as well as color Doppler and duplex ultrasound were performed to evaluate the lower extremity deep venous systems from the level of the common femoral vein and including the common femoral, femoral, profunda femoral, popliteal and calf veins including the posterior tibial, peroneal and gastrocnemius veins when visible. The superficial great saphenous vein was also interrogated. Spectral Doppler was utilized to evaluate flow at  rest and with distal augmentation maneuvers in the common femoral, femoral and popliteal veins. COMPARISON:  None Available. FINDINGS: RIGHT LOWER EXTREMITY Common Femoral Vein: No evidence of thrombus. Normal compressibility, respiratory phasicity and response to augmentation. Saphenofemoral Junction: No evidence of thrombus. Normal compressibility and flow on color Doppler imaging. Profunda Femoral Vein: No evidence of thrombus. Normal compressibility and flow on color Doppler imaging. Femoral Vein: No evidence of thrombus. Normal compressibility, respiratory phasicity and response to augmentation. Popliteal Vein: No evidence of thrombus. Normal compressibility, respiratory phasicity and response to augmentation. Calf Veins: No evidence of thrombus. Normal compressibility and flow on color Doppler imaging. Superficial Great Saphenous Vein: No evidence of thrombus. Normal compressibility. Venous Reflux:  None. Other Findings: No evidence of superficial thrombophlebitis or abnormal fluid collection. LEFT LOWER EXTREMITY Common Femoral Vein: No evidence of thrombus. Normal compressibility, respiratory phasicity and response to augmentation. Saphenofemoral Junction: No evidence of thrombus. Normal compressibility and flow on color Doppler imaging. Profunda Femoral Vein: No evidence of thrombus. Normal compressibility and  flow on color Doppler imaging. Femoral Vein: No evidence of thrombus. Normal compressibility, respiratory phasicity and response to augmentation. Popliteal Vein: No evidence of thrombus. Normal compressibility, respiratory phasicity and response to augmentation. Calf Veins: No evidence of thrombus. Normal compressibility and flow on color Doppler imaging. Superficial Great Saphenous Vein: No evidence of thrombus. Normal compressibility. Venous Reflux:  None. Other Findings: No evidence of superficial thrombophlebitis or abnormal fluid collection. IMPRESSION: No evidence of deep venous thrombosis in either lower extremity. Electronically Signed   By: Erica Hau M.D.   On: 02/27/2024 13:23   DG Chest Port 1 View Result Date: 02/27/2024 CLINICAL DATA:  Shortness of breath EXAM: PORTABLE CHEST 1 VIEW COMPARISON:  January 27, 2022 FINDINGS: The heart size and mediastinal contours are within normal limits. Both lungs are clear. The visualized skeletal structures are unremarkable. Mild equalization flow towards the apices and interstitial prominence could correlate with subtle congestive changes with a small bilateral pleural reactions IMPRESSION: Mild congestive changes with small bilateral pleural reactions. Electronically Signed   By: Fredrich Jefferson M.D.   On: 02/27/2024 10:53       Edwen Mclester M.D. [facility] 02/28/2024, 1:11 PM  Available via Epic secure chat 7am-7pm After 7 pm, please refer to night coverage provider listed on amion.

## 2024-02-28 NOTE — Progress Notes (Signed)
  Echocardiogram 2D Echocardiogram has been performed.  Jearldine Cassady L Lanorris Kalisz RDCS 02/28/2024, 3:24 PM

## 2024-02-28 NOTE — Evaluation (Signed)
 Clinical/Bedside Swallow Evaluation Patient Details  Name: Heather [name] MRN: [id number] Date of Birth: [birth year]  Today's Date: 02/28/2024 Time: SLP Start Time (ACUTE ONLY): 0910 SLP Stop Time (ACUTE ONLY): 0925 SLP Time Calculation (min) (ACUTE ONLY): 15 min  Past Medical History:  Past Medical History:  Diagnosis Date   Anxiety    GERD (gastroesophageal reflux disease)    Hx of shoulder surgery    Left Shoulder - pitched nerve.   Other and unspecified hyperlipidemia    Unspecified essential hypertension    Past Surgical History:  Past Surgical History:  Procedure Laterality Date   BIOPSY  04/09/2020   Procedure: BIOPSY;  Surgeon: Ruby Corporal, MD;  Location: [facility];  Service: Endoscopy;;   CERVICAL DISC SURGERY     CESAREAN SECTION  1970   colonscopy  08/22/2012   moderate diverticulosos in sigmoid and two tubular adenomas   ESOPHAGOGASTRODUODENOSCOPY N/A 04/09/2020   Rehman:Normal hypopharynx.normal esophagus, z line irreg, 38 cm from incisors, erythematous mucosa in antrum and preplyoric region, gastric antral mucosa with non specific reactive gastropathy, normal duodenal bul and second portion of duodenum   History of shoulder surgery Left    pitched nerve.   HPI:  Patient is a 89  y.o. female with PMH: dementia, anxiety, GERD, HLD, HTN, HLD, IBS, compression fracture of L1, NASH cirrhosis, arthritis/debility previous history of respiratory failure in 2023, History of ?COPD, previous admission 02/24/23 with COPD exacerbation/PNA. She presented to the [facility] ED on 02/27/24 with generalized weakness, SOB since 5/4. She was hypoxic  at 80% on RA, placed on supplemental oxygen  up to 7L. CT abd/chest showed: "New right middle and lower lobe collapse with associated volume loss and endobronchial opacification. There is an oval hyperdenseendobronchial component within the bronchus intermedius which could reflect an aspirated foreign body or a bronchial artery aneurysm." She  was transferred to [facility] for further evaluation.    Assessment / Plan / Recommendation  Clinical Impression  Patient presents with clinical s/s of a suspected primary oral phase dysphagia as per this bedside swallow evaluation with impact from reduced cognitive function from dementia. Patient able to feed self but required cues to initiate at times. Swallow initiation was timely with cup sips of thin liquids and no overt s/s aspiration observed. Puree solids were tolerated without observed difficulty as well. With solids (graham cracker), patient with mildly prolonged mastication, oral residuals s/p initial swallow. SLP recommending dys 3(mechanical soft) solids, thin liquids and will follow for toleration and ability to advance. SLP Visit Diagnosis: Dysphagia, unspecified (R13.10)    Aspiration Risk  Mild aspiration risk    Diet Recommendation Dysphagia 3 (Mech soft);Thin liquid    Liquid Administration via: Cup;Straw Medication Administration: Whole meds with liquid Supervision: Patient able to self feed;Full supervision/cueing for compensatory strategies Compensations: Slow rate;Small sips/bites;Minimize environmental distractions Postural Changes: Seated upright at 90 degrees    Other  Recommendations Oral Care Recommendations: Oral care BID    Recommendations for follow up therapy are one component of a multi-disciplinary discharge planning process, led by the attending physician.  Recommendations may be updated based on patient status, additional functional criteria and insurance authorization.  Follow up Recommendations Other (comment) (TBD)      Assistance Recommended at Discharge    Functional Status Assessment Patient has had a recent decline in their functional status and demonstrates the ability to make significant improvements in function in a reasonable and predictable amount of time.  Frequency and Duration min 1 x/week  1  week       Prognosis Prognosis for improved  oropharyngeal function: Good Barriers to Reach Goals: Cognitive deficits      Swallow Study   General Date of Onset: 02/27/24 HPI: Patient is a 89  y.o. female with PMH: dementia, anxiety, GERD, HLD, HTN, HLD, IBS, compression fracture of L1, NASH cirrhosis, arthritis/debility previous history of respiratory failure in 2023, History of ?COPD, previous admission 02/24/23 with COPD exacerbation/PNA. She presented to the [facility] ED on 02/27/24 with generalized weakness, SOB since 5/4. She was hypoxic  at 80% on RA, placed on supplemental oxygen  up to 7L. CT abd/chest showed: "New right middle and lower lobe collapse with associated volume loss and endobronchial opacification. There is an oval hyperdenseendobronchial component within the bronchus intermedius which could reflect an aspirated foreign body or a bronchial artery aneurysm." She was transferred to [facility] for further evaluation. Type of Study: Bedside Swallow Evaluation Previous Swallow Assessment: none found Diet Prior to this Study: NPO Temperature Spikes Noted: No Respiratory Status: Nasal cannula History of Recent Intubation: No Behavior/Cognition: Alert;Cooperative;Pleasant mood Oral Cavity Assessment: Dry Oral Care Completed by SLP: Yes Oral Cavity - Dentition: Dentures, top;Dentures, bottom Self-Feeding Abilities: Able to feed self Patient Positioning: Upright in bed Baseline Vocal Quality: Normal Volitional Cough: Cognitively unable to elicit Volitional Swallow: Able to elicit    Oral/Motor/Sensory Function Overall Oral Motor/Sensory Function: Within functional limits   Ice Chips     Thin Liquid Thin Liquid: Within functional limits Presentation: Cup;Self Fed    Nectar Thick     Honey Thick     Puree Puree: Within functional limits Presentation: Spoon   Solid     Solid: Impaired Oral Phase Impairments: Impaired mastication Oral Phase Functional Implications: Prolonged oral transit;Impaired mastication;Oral residue     Jacqualine Mater, MA, CCC-SLP Speech Therapy

## 2024-02-29 ENCOUNTER — Inpatient Hospital Stay (HOSPITAL_COMMUNITY)

## 2024-02-29 ENCOUNTER — Telehealth: Payer: Self-pay | Admitting: Internal Medicine

## 2024-02-29 ENCOUNTER — Encounter (HOSPITAL_COMMUNITY): Payer: Self-pay | Admitting: Internal Medicine

## 2024-02-29 ENCOUNTER — Inpatient Hospital Stay (HOSPITAL_COMMUNITY): Payer: Self-pay

## 2024-02-29 ENCOUNTER — Encounter (HOSPITAL_COMMUNITY)
Admission: EM | Disposition: A | Discharge status: Nursing Home (Includes ICF) | Payer: Self-pay | Source: Skilled Nursing Facility | Attending: Internal Medicine

## 2024-02-29 DIAGNOSIS — J9809 Other diseases of bronchus, not elsewhere classified: Secondary | ICD-10-CM

## 2024-02-29 DIAGNOSIS — J9811 Atelectasis: Secondary | ICD-10-CM | POA: Diagnosis not present

## 2024-02-29 DIAGNOSIS — R918 Other nonspecific abnormal finding of lung field: Secondary | ICD-10-CM

## 2024-02-29 DIAGNOSIS — J9601 Acute respiratory failure with hypoxia: Secondary | ICD-10-CM | POA: Diagnosis not present

## 2024-02-29 DIAGNOSIS — K7581 Nonalcoholic steatohepatitis (NASH): Secondary | ICD-10-CM | POA: Diagnosis not present

## 2024-02-29 DIAGNOSIS — K7460 Unspecified cirrhosis of liver: Secondary | ICD-10-CM | POA: Diagnosis not present

## 2024-02-29 DIAGNOSIS — J9819 Other pulmonary collapse: Secondary | ICD-10-CM

## 2024-02-29 DIAGNOSIS — I10 Essential (primary) hypertension: Secondary | ICD-10-CM | POA: Diagnosis not present

## 2024-02-29 HISTORY — PX: LUNG BIOPSY: SHX5088

## 2024-02-29 HISTORY — PX: BRONCHIAL WASHINGS: SHX5105

## 2024-02-29 HISTORY — PX: VIDEO BRONCHOSCOPY: SHX5072

## 2024-02-29 LAB — COMPREHENSIVE METABOLIC PANEL WITH GFR
ALT: 26 U/L (ref 0–44)
AST: 25 U/L (ref 15–41)
Albumin: 3.4 g/dL — ABNORMAL LOW (ref 3.5–5.0)
Alkaline Phosphatase: 48 U/L (ref 38–126)
Anion gap: 14 (ref 5–15)
BUN: 30 mg/dL — ABNORMAL HIGH (ref 8–23)
CO2: 23 mmol/L (ref 22–32)
Calcium: 9.1 mg/dL (ref 8.9–10.3)
Chloride: 102 mmol/L (ref 98–111)
Creatinine, Ser: 0.8 mg/dL (ref 0.44–1.00)
GFR, Estimated: >60 mL/min (ref >60)
Glucose, Bld: 141 mg/dL — ABNORMAL HIGH (ref 70–99)
Potassium: 3.4 mmol/L — ABNORMAL LOW (ref 3.5–5.1)
Sodium: 139 mmol/L (ref 135–145)
Total Bilirubin: 0.3 mg/dL (ref 0.0–1.2)
Total Protein: 7.3 g/dL (ref 6.5–8.1)

## 2024-02-29 LAB — CBC
HCT: 44.9 % (ref 36.0–46.0)
Hemoglobin: 14.8 g/dL (ref 12.0–15.0)
MCH: 31 pg (ref 26.0–34.0)
MCHC: 33 g/dL (ref 30.0–36.0)
MCV: 94.1 fL (ref 80.0–100.0)
Platelets: 175 10*3/uL (ref 150–400)
RBC: 4.77 MIL/uL (ref 3.87–5.11)
RDW: 12.9 % (ref 11.5–15.5)
WBC: 15.1 10*3/uL — ABNORMAL HIGH (ref 4.0–10.5)
nRBC: 0 % (ref 0.0–0.2)

## 2024-02-29 LAB — TROPONIN I (HIGH SENSITIVITY): Troponin I (High Sensitivity): 17 ng/L (ref <18)

## 2024-02-29 LAB — BODY FLUID CELL COUNT WITH DIFFERENTIAL
Eos, Fluid: 1 %
Lymphs, Fluid: 2 %
Monocyte-Macrophage-Serous Fluid: 4 % — ABNORMAL LOW (ref 50–90)
Neutrophil Count, Fluid: 93 % — ABNORMAL HIGH (ref 0–25)
Total Nucleated Cell Count, Fluid: 330 uL (ref 0–1000)

## 2024-02-29 LAB — PHOSPHORUS: Phosphorus: 2.8 mg/dL (ref 2.5–4.6)

## 2024-02-29 LAB — BRAIN NATRIURETIC PEPTIDE: B Natriuretic Peptide: 140.2 pg/mL — ABNORMAL HIGH (ref 0.0–100.0)

## 2024-02-29 LAB — MAGNESIUM: Magnesium: 2.3 mg/dL (ref 1.7–2.4)

## 2024-02-29 LAB — AMMONIA: Ammonia: 17 umol/L (ref 9–35)

## 2024-02-29 SURGERY — VIDEO BRONCHOSCOPY WITHOUT FLUORO
Anesthesia: General

## 2024-02-29 MED ORDER — FENTANYL CITRATE (PF) 100 MCG/2ML IJ SOLN
INTRAMUSCULAR | Status: AC
  Filled 2024-02-29: qty 2

## 2024-02-29 MED ORDER — FENTANYL CITRATE (PF) 100 MCG/2ML IJ SOLN
INTRAMUSCULAR | Status: DC | PRN
  Administered 2024-02-29: 100 ug via INTRAVENOUS

## 2024-02-29 MED ORDER — LIDOCAINE 2% (20 MG/ML) 5 ML SYRINGE
INTRAMUSCULAR | Status: DC | PRN
  Administered 2024-02-29: 100 mg via INTRAVENOUS

## 2024-02-29 MED ORDER — SODIUM CHLORIDE (PF) 0.9 % IJ SOLN
INTRAMUSCULAR | Status: DC | PRN
  Administered 2024-02-29: 6.5 mL

## 2024-02-29 MED ORDER — ROCURONIUM BROMIDE 100 MG/10ML IV SOLN
INTRAVENOUS | Status: DC | PRN
  Administered 2024-02-29: 50 mg via INTRAVENOUS

## 2024-02-29 MED ORDER — ONDANSETRON HCL 4 MG/2ML IJ SOLN
INTRAMUSCULAR | Status: DC | PRN
  Administered 2024-02-29: 4 mg via INTRAVENOUS

## 2024-02-29 MED ORDER — TRANEXAMIC ACID FOR INHALATION
500.0000 mg | Freq: Three times a day (TID) | RESPIRATORY_TRACT | Status: AC
  Administered 2024-03-01: 500 mg via RESPIRATORY_TRACT
  Filled 2024-02-29 (×2): qty 10

## 2024-02-29 MED ORDER — DEXAMETHASONE SODIUM PHOSPHATE 10 MG/ML IJ SOLN
INTRAMUSCULAR | Status: DC | PRN
  Administered 2024-02-29: 5 mg via INTRAVENOUS

## 2024-02-29 MED ORDER — SUGAMMADEX SODIUM 200 MG/2ML IV SOLN
INTRAVENOUS | Status: DC | PRN
  Administered 2024-02-29: 350 mg via INTRAVENOUS

## 2024-02-29 MED ORDER — FLUCONAZOLE 100MG IVPB: 100.0000 mg | INTRAVENOUS | Status: DC

## 2024-02-29 MED ORDER — LACTULOSE 10 GM/15ML PO SOLN
10.0000 g | Freq: Once | ORAL | Status: AC
  Administered 2024-02-29: 10 g via ORAL
  Filled 2024-02-29: qty 15

## 2024-02-29 MED ORDER — PROPOFOL 10 MG/ML IV BOLUS
INTRAVENOUS | Status: DC | PRN
  Administered 2024-02-29: 90 mg via INTRAVENOUS

## 2024-02-29 MED ORDER — POTASSIUM CHLORIDE 10 MEQ/100ML IV SOLN
10.0000 meq | INTRAVENOUS | Status: AC
  Administered 2024-02-29: 10 meq via INTRAVENOUS
  Filled 2024-02-29 (×2): qty 100

## 2024-02-29 MED ORDER — PROPOFOL 10 MG/ML IV BOLUS
INTRAVENOUS | Status: AC
  Filled 2024-02-29: qty 20

## 2024-02-29 MED ORDER — SODIUM CHLORIDE 0.9 % IV SOLN: INTRAVENOUS | Status: DC | PRN

## 2024-02-29 MED ORDER — FLUCONAZOLE 100 MG PO TABS
100.0000 mg | ORAL_TABLET | Freq: Every day | ORAL | Status: DC
  Administered 2024-02-29 – 2024-03-04 (×5): 100 mg via ORAL
  Filled 2024-02-29 (×5): qty 1

## 2024-02-29 MED ORDER — PHENYLEPHRINE HCL (PRESSORS) 10 MG/ML IV SOLN
INTRAVENOUS | Status: DC | PRN
  Administered 2024-02-29: 80 ug via INTRAVENOUS

## 2024-02-29 NOTE — Telephone Encounter (Signed)
 Ned to order [employer]

## 2024-02-29 NOTE — Telephone Encounter (Signed)
 Post bronch and post hospital - give followu with Dr Waymond Hailey in [place] next 1-2 weeks

## 2024-02-29 NOTE — Anesthesia Preprocedure Evaluation (Addendum)
 Anesthesia Evaluation  Patient identified by MRN, date of birth, ID band Patient awake and Patient confused    Reviewed: Allergy & Precautions, NPO status , Patient's Chart, lab work & pertinent test results, reviewed documented beta blocker date and time   Airway Mallampati: III  TM Distance: >3 FB     Dental  (+) Edentulous Upper, Edentulous Lower   Pulmonary pneumonia, unresolved, Recent URI , neg PE RLL collapse    + decreased breath sounds      Cardiovascular hypertension, + CAD  (-) Past MI, (-) Cardiac Stents, (-) CABG and (-) CHF  Rhythm:Regular Rate:Normal     Neuro/Psych neg Seizures  Anxiety      Neuromuscular disease    GI/Hepatic ,GERD  Medicated and Controlled,,(+) Hepatitis -  Endo/Other    Renal/GU Renal disease     Musculoskeletal   Abdominal   Peds  Hematology   Anesthesia Other Findings   Reproductive/Obstetrics                             Anesthesia Physical Anesthesia Plan  ASA: 3  Anesthesia Plan: General   Post-op Pain Management:    Induction: Intravenous  PONV Risk Score and Plan: 3 and Ondansetron  and Dexamethasone  Airway Management Planned: Oral ETT  Additional Equipment:   Intra-op Plan:   Post-operative Plan: Extubation in OR  Informed Consent: I have reviewed the patients History and Physical, chart, labs and discussed the procedure including the risks, benefits and alternatives for the proposed anesthesia with the patient or authorized representative who has indicated his/her understanding and acceptance.     Dental advisory given  Plan Discussed with: CRNA  Anesthesia Plan Comments:         Anesthesia Quick Evaluation

## 2024-02-29 NOTE — Plan of Care (Signed)

## 2024-02-29 NOTE — Anesthesia Postprocedure Evaluation (Signed)
 Anesthesia Post Note  Patient: Heather [name]  Procedure(s) Performed: VIDEO BRONCHOSCOPY WITHOUT FLUORO IRRIGATION, BRONCHUS BIOPSY, LUNG     Patient location during evaluation: PACU Anesthesia Type: General Level of consciousness: awake and alert and confused (appears to be at baseline) Pain management: pain level controlled Vital Signs Assessment: post-procedure vital signs reviewed and stable Respiratory status: spontaneous breathing, nonlabored ventilation, respiratory function stable and patient connected to nasal cannula oxygen  Cardiovascular status: blood pressure returned to baseline and stable Postop Assessment: no apparent nausea or vomiting Anesthetic complications: no   No notable events documented.  Last Vitals:  Vitals:   02/29/24 1430 02/29/24 1451  BP: 110/60 112/61  Pulse: 84 85  Resp: 20 14  Temp:  36.9 C  SpO2: 94% 90%    Last Pain:  Vitals:   02/29/24 1451  TempSrc: Oral  PainSc:                  Leslye Rast

## 2024-02-29 NOTE — Op Note (Addendum)
 Name:  [name] MRN:  [id number] DOB:  1931/07/22 [address] [place] [address]  PROCEDURE NOTE  Procedure(s): Flexible bronchoscopy [phone]) Bronchial alveolar lavage [id number] of the Right LOWER LOBE/MIDDLE LOBE Endobronchial biopsy ([id number]) of the RIGHT BRONCHIS INTERMEDIUS secondary carina and endobronchial lesion  Indications:  RML and RLL occlusion with mass v foreign body. RLL collapse and acute hypxemia resp failure  Consent:  Procedure, benefits, risks and alternatives discussed.  Questions answered.  Consent obtained.  Anesthesia:   gENERAL ANESTHESIA  Location: [facility]. Intpatien  Procedure summary:  Appropriate equipment was assembled.  The patient was brought to the procedure suite room and identified as Heather [name] with 1931-06-13  Safety timeout was performed. The patient was placed supine on the  table, airway andGA given by anesthesia team  After the appropriate level of GA  was assured, flexible video bronchoscope was lubricated and inserted through the ET Tube   Airway examination was YES performed bilaterally to subsegmental level.  Minimal clear secretions were noted, mucosa appeared normal      EXCEPT ENTRANCE TO RIGHT MIDDLE LOBE AND RIGHT LOWER LOBE COMPLETELY WALLED OFF AT MID-DISTAL END OF RIGH BRONCHHUS INTERMEDIUS.  AIRWAY THEREE LOOKED ULCERATED. POPYPOILD LESION PRESNT AND WAS FRIABLE. NO PULSATILE MATIERAL OR MUCUS PLUG NOTED.   PRIOR TO PROICEDURE COLD SALINE AND DILUTED EPI APPLIED TO RML AND    3 X ENDOBRONCHIAL BIOPSIES DONE AT SECONDARY CARINA DONE -> THEN 1 X ENDBRONCHIAL BIOPSY OF THE POPYPOID LESION DONE -> THIS THEN OPENED UP THE WHOLE DISTAL AIRWAY WITHOUT ANY BLEEDING. THE DISTAL AIRWAY LOOKED NECROTIC. WHITISH DEBRIS ALSO NOTED. nO OBVIOUS FOOD MATERIAL  Bronchial alveolar lavage of the RLL AND RML DONE WITH 5CC X 3. bLOODY RETURNS OBTRAINED     After hemostasis was assure, the  bronchoscope was withdrawn.  The patient was recovered and then  transferred to recovery area  Post-procedure chest x-ray was YES ordered.  Specimens sent: Bronchial alveolar lavage specimen of the RLL for cell count , microbiology and cytology.  Complications:  No immediate complications were noted.  Hemodynamic parameters and oxygenation remained stable throughout the procedure.  Estimated blood loss:  NONE  IMPRESSION 1. RIGHT MIDDLE LOBE AND RIGHT LOWER LOBE ENDOBRONCHIAL LESION  - Ddx likely malignancy but could have been chronic foreign body with distal necrosis of airway 2. STATUS POST ENDOBRONCHIAL BIOPSY 3. RIGHT LOWER LOBE BAL    Followup - AWAIT RESULTS - GET CXR - MOVE BACK TO FLOOR  - POST PROCEDURE Q8H TRANEXAMIC ACID - HOLDD OFF LOVENOX  X 24H - can follow with Dr Waymond Hailey in [place] for results  Dr. Maire Scot, M.D., [facility].C.P Pulmonary and Critical Care Medicine Staff Physician [facility] Pager: [phone], If no answer or between  15:00h - 7:00h: call [phone]  02/29/2024 1:59 PM

## 2024-02-29 NOTE — Telephone Encounter (Signed)
 PT needed PM appt and was willing to come to [facility]. Made appt w/Ms. Groce. NFN.

## 2024-02-29 NOTE — Progress Notes (Addendum)
 Heather [name]                                                                              [name], is a 89 y.o. female, DOB - [birth year], ZOX:[id number] Admit date - 02/27/2024    Outpatient Primary MD for the patient is [name], [name], MD  LOS - 2  days  Chief Complaint  Patient presents with   Shortness of Breath       Brief summary   Patient is a 89 year old female with anxiety, GERD, HLD, HTN, HLD, IBS, compression fracture of L1, NASH cirrhosis, arthritis/debility previous history of respiratory failure in 2023, History of ?COPD on  Breo, previous admission on 02/24/2023 for COPD exacerbation/pneumonia  Presented to the ED from [facility] with generalized weakness, shortness of breath since last Sunday.On presentation hypoxia 80% on RA-placed on supplemental oxygen  up to 7 L, In the ED patient felt to be poor historian, tachypneic up to 30 BP stable.  Labs showed ABG PCO238 PO2.4PO 266, mild hyponatremia ammonia 37 BNP 179 leukocytosis 19.7. CXR> mild congestive changes with small bilateral pleural effusions.Aaron Aas   COVID RSV flu negative Patient was given Solu-Medrol  125, DuoNeb IV Lasix  and admission was requested. D-dimer elevated, CTA chest showed no evidence of acute PE, new right middle and lower lobe collapse with associated volume loss and endobronchial opacification.  There is an oval hyperdense endobronchial component with an the bronchus intermedius which could reflect an aspirated foreign body or bronchial artery aneurysm. PCCM consulted  Assessment & Plan    Principal Problem:   Acute hypoxic respiratory failure (HCC) COPD with acute exacerbation, right middle lobe and lower lobe collapse -Presented with wheezing, shortness of breath, hypoxia - Chest x-ray with congestive changes with right lower lobe hypoventilatory atelectasis - D-dimer elevated, CTA showed new right middle and lower lobe collapse with associated volume loss and  endobronchial opacification -  SLP eval 5/7  dysphagia 3 diet, thin liquids - Continue albuterol , Brovana, Pulmicort, IV Solu-Medrol , Yupelri - Plan for bronchoscopy today, n.p.o. - 2D echo showed EF of 65 to 70%, no regional WMA  Active Problems: Mild acute diastolic CHF - 2D echo showed EF of 65 to 70%, indeterminate diastolic parameters - Chest x-ray on admission had shown small pleural effusion, received Lasix   Right leg pain -Venous Dopplers showed no evidence of DVT in either extremity  Leukocytosis - Leukocytosis likely also due to IV steroids, continue IV Rocephin    GERD - Continue PPI    Liver cirrhosis secondary to NASH (HCC) -Ammonia level slightly up, continue MiraLAX     Essential hypertension BP stable   Dementia, debility, arthritis -Palliative medicine consulted for GOC - Continue Seroquel  12.5 mg daily at bedtime  Obesity class I Estimated body mass index is 30.66 kg/m as calculated from the following:   Height as of this encounter: 5\' 7"  (1.702 m).   Weight as of this encounter: 88.8 kg.  Code Status: Full code DVT Prophylaxis:     Level of Care: Level of care: Progressive Family Communication: Updated patient Disposition Plan:      Remains inpatient appropriate: Plan for bronchoscopy today  Procedures:    Consultants:   PCCM  Antimicrobials:   Anti-infectives (From admission, onward)    Start     Dose/Rate Route Frequency Ordered Stop   02/27/24 1600  [MAR Hold]  cefTRIAXone  (ROCEPHIN ) 1 g in sodium chloride  0.9 % 100 mL IVPB        (MAR Hold since Thu 02/29/2024 at 1235.Hold Reason: Transfer to a Procedural area)   1 g 200 mL/hr over 30 Minutes Intravenous Every 24 hours 02/27/24 1430            Medications  [MAR Hold] albuterol   5 mg/hr Nebulization Once   [MAR Hold] ALPRAZolam   1 mg Oral BID   [MAR Hold] arformoterol  15 mcg Nebulization BID   [MAR Hold] aspirin   81 mg Oral Q breakfast   [MAR Hold] budesonide (PULMICORT)  nebulizer solution  0.25 mg Nebulization BID   [MAR Hold] Chlorhexidine Gluconate Cloth  6 each Topical Q0600   [MAR Hold] ferrous sulfate  325 mg Oral Q breakfast   [MAR Hold] furosemide   20 mg Intravenous Daily   [MAR Hold] isosorbide  mononitrate  60 mg Oral Daily   [MAR Hold] methylPREDNISolone  (SOLU-MEDROL ) injection  40 mg Intravenous Q12H   [MAR Hold] metoprolol  tartrate  25 mg Oral BID   [MAR Hold] mupirocin ointment   Nasal BID   [MAR Hold] pantoprazole   40 mg Oral Daily   [MAR Hold] QUEtiapine   12.5 mg Oral QHS   [MAR Hold] revefenacin  175 mcg Nebulization Daily      Subjective:   Kateena Behnke was seen and examined today.  Alert and awake, oriented to self, appears to be confused.  No nausea vomiting, chest pain, shortness of breath, fevers.   Objective:   Vitals:   02/28/24 1956 02/29/24 0453 02/29/24 0531 02/29/24 0830  BP: 135/65  (!) 152/63   Pulse: (!) 110  99   Resp: 20  20   Temp: 98.8 F (37.1 C)  (!) 97.5 F (36.4 C)   TempSrc:      SpO2: 92%  91% 100%  Weight:  88.8 kg    Height:        Intake/Output Summary (Last 24 hours) at 02/29/2024 1252 Last data filed at 02/29/2024 0700 Gross per 24 hour  Intake 0 ml  Output --  Net 0 ml     Wt Readings from Last 3 Encounters:  02/29/24 88.8 kg  04/09/23 78.9 kg  04/25/22 77.9 kg    Physical Exam General: Appears somewhat confused Cardiovascular: S1 S2 clear, RRR.  Respiratory: dec BS at bases Gastrointestinal: Soft, nontender, nondistended, NBS Ext: no pedal edema bilaterally Neuro: no new deficits Psych: anxious, appears somewhat confused    Data Reviewed:  I have personally reviewed following labs    CBC Lab Results  Component Value Date   WBC 15.1 (H) 02/29/2024   RBC 4.77 02/29/2024   HGB 14.8 02/29/2024   HCT 44.9 02/29/2024   MCV 94.1 02/29/2024   MCH 31.0 02/29/2024   PLT 175 02/29/2024   MCHC 33.0 02/29/2024   RDW 12.9 02/29/2024   LYMPHSABS 2.7 04/09/2023   MONOABS 0.8  04/09/2023   EOSABS 0.3 04/09/2023   BASOSABS 0.1 04/09/2023     Last metabolic panel Lab Results  Component Value Date   NA 139 02/29/2024   K 3.4 (L) 02/29/2024   CL 102 02/29/2024   CO2 23 02/29/2024   BUN 30 (H) 02/29/2024   CREATININE 0.80 02/29/2024   GLUCOSE 141 (H)  02/29/2024   GFRNONAA >60 02/29/2024   GFRAA >60 03/18/2019   CALCIUM  9.1 02/29/2024   PHOS 2.8 02/29/2024   PROT 7.3 02/29/2024   ALBUMIN 3.4 (L) 02/29/2024   BILITOT 0.3 02/29/2024   ALKPHOS 48 02/29/2024   AST 25 02/29/2024   ALT 26 02/29/2024   ANIONGAP 14 02/29/2024    CBG (last 3)  No results for input(s): "GLUCAP" in the last 72 hours.    Coagulation Profile: No results for input(s): "INR", "PROTIME" in the last 168 hours.   Radiology Studies: I have personally reviewed the imaging studies  ECHOCARDIOGRAM COMPLETE Result Date: 02/28/2024    ECHOCARDIOGRAM REPORT   Patient Name:   Heather [name] Date of Exam: 02/28/2024 Medical Rec #:  [id number]          Height:       67.0 in Accession #:    [id number]         Weight:       194.2 lb Date of Birth:  11/04/1930          BSA:          1.997 m Patient Age:    89 years           BP:           110/56 mmHg Patient Gender: F                  HR:           91 bpm. Exam Location:  Inpatient Procedure: 2D Echo, Cardiac Doppler and Color Doppler (Both Spectral and Color            Flow Doppler were utilized during procedure). Indications:    CHF-acute diastolic  History:        Patient has prior history of Echocardiogram examinations, most                 recent 04/26/2022. Signs/Symptoms:Chest Pain; Risk                 Factors:Hypertension.  Sonographer:    Juanita Shaw Referring Phys: [id number] RAMESH KC IMPRESSIONS  1. Left ventricular ejection fraction, by estimation, is 65 to 70%. The left ventricle has normal function. The left ventricle has no regional wall motion abnormalities. Left ventricular diastolic parameters are indeterminate.  2. Right ventricular  systolic function is normal. The right ventricular size is normal. There is normal pulmonary artery systolic pressure.  3. The mitral valve is normal in structure. Mild mitral valve regurgitation. No evidence of mitral stenosis.  4. The aortic valve is normal in structure. Aortic valve regurgitation is not visualized. No aortic stenosis is present.  5. The inferior vena cava is normal in size with greater than 50% respiratory variability, suggesting right atrial pressure of 3 mmHg. FINDINGS  Left Ventricle: Left ventricular ejection fraction, by estimation, is 65 to 70%. The left ventricle has normal function. The left ventricle has no regional wall motion abnormalities. The left ventricular internal cavity size was normal in size. There is  no left ventricular hypertrophy. Left ventricular diastolic parameters are indeterminate. Right Ventricle: The right ventricular size is normal. No increase in right ventricular wall thickness. Right ventricular systolic function is normal. There is normal pulmonary artery systolic pressure. The tricuspid regurgitant velocity is 2.22 m/s, and  with an assumed right atrial pressure of 3 mmHg, the estimated right ventricular systolic pressure is 22.7 mmHg. Left Atrium: Left atrial size was normal in size. Right Atrium:  Right atrial size was normal in size. Pericardium: There is no evidence of pericardial effusion. Mitral Valve: The mitral valve is normal in structure. Mild mitral valve regurgitation. No evidence of mitral valve stenosis. MV peak gradient, 10.1 mmHg. The mean mitral valve gradient is 5.0 mmHg. Tricuspid Valve: The tricuspid valve is normal in structure. Tricuspid valve regurgitation is not demonstrated. No evidence of tricuspid stenosis. Aortic Valve: The aortic valve is normal in structure. Aortic valve regurgitation is not visualized. No aortic stenosis is present. Aortic valve mean gradient measures 4.0 mmHg. Aortic valve peak gradient measures 7.5 mmHg. Aortic  valve area, by VTI measures 2.54 cm. Pulmonic Valve: The pulmonic valve was normal in structure. Pulmonic valve regurgitation is not visualized. No evidence of pulmonic stenosis. Aorta: The aortic root is normal in size and structure. Venous: The inferior vena cava is normal in size with greater than 50% respiratory variability, suggesting right atrial pressure of 3 mmHg. IAS/Shunts: No atrial level shunt detected by color flow Doppler.  LEFT VENTRICLE PLAX 2D LVIDd:         4.30 cm LVIDs:         2.80 cm LV PW:         1.00 cm LV IVS:        0.90 cm LVOT diam:     1.80 cm LV SV:         51 LV SV Index:   26 LVOT Area:     2.54 cm  LV Volumes (MOD) LV vol d, MOD A2C: 87.6 ml LV vol d, MOD A4C: 102.0 ml LV vol s, MOD A2C: 20.0 ml LV vol s, MOD A4C: 38.8 ml LV SV MOD A2C:     67.6 ml LV SV MOD A4C:     102.0 ml LV SV MOD BP:      64.4 ml RIGHT VENTRICLE             IVC RV Basal diam:  3.00 cm     IVC diam: 1.30 cm RV Mid diam:    2.20 cm RV S prime:     13.30 cm/s TAPSE (M-mode): 2.0 cm LEFT ATRIUM             Index        RIGHT ATRIUM          Index LA diam:        3.30 cm 1.65 cm/m   RA Area:     7.94 cm LA Vol (A2C):   26.5 ml 13.27 ml/m  RA Volume:   13.30 ml 6.66 ml/m LA Vol (A4C):   47.4 ml 23.73 ml/m LA Biplane Vol: 35.9 ml 17.97 ml/m  AORTIC VALVE                    PULMONIC VALVE AV Area (Vmax):    1.99 cm     PV Vmax:       1.01 m/s AV Area (Vmean):   2.06 cm     PV Peak grad:  4.1 mmHg AV Area (VTI):     2.54 cm AV Vmax:           137.00 cm/s AV Vmean:          87.000 cm/s AV VTI:            0.202 m AV Peak Grad:      7.5 mmHg AV Mean Grad:      4.0 mmHg LVOT Vmax:  107.00 cm/s LVOT Vmean:        70.300 cm/s LVOT VTI:          0.202 m LVOT/AV VTI ratio: 1.00  AORTA Ao Root diam: 3.10 cm Ao Asc diam:  3.40 cm MITRAL VALVE                TRICUSPID VALVE MV Area (PHT): 3.87 cm     TR Peak grad:   19.7 mmHg MV Area VTI:   2.27 cm     TR Vmax:        222.00 cm/s MV Peak grad:  10.1 mmHg MV  Mean grad:  5.0 mmHg     SHUNTS MV Vmax:       1.59 m/s     Systemic VTI:  0.20 m MV Vmean:      107.0 cm/s   Systemic Diam: 1.80 cm MV Decel Time: 196 msec MV E velocity: 118.00 cm/s Dorothye Gathers MD Electronically signed by Dorothye Gathers MD Signature Date/Time: 02/28/2024/4:48:19 PM    Final    DG Chest Port 1 View Result Date: 02/28/2024 CLINICAL DATA:  CHF EXAM: PORTABLE CHEST 1 VIEW COMPARISON:  Feb 27, 2024 FINDINGS: Minimal residual bilateral interstitial prominence correlate with subtle residual congestive changes with right lower lobe hypoventilatory atelectasis No pleural effusion Heart and mediastinum normal IMPRESSION: Minimal residual congestive changes with right lower lobe hypoventilatory atelectasis. Electronically Signed   By: Fredrich Jefferson M.D.   On: 02/28/2024 08:39   CT Angio Chest PE W and/or Wo Contrast Result Date: 02/27/2024 CLINICAL DATA:  Pulmonary embolism (PE) suspected, low to intermediate prob, positive D-dimer Generalized weakness with shortness of breath for 2 days. Elevated D-dimer levels. EXAM: CT ANGIOGRAPHY CHEST WITH CONTRAST TECHNIQUE: Multidetector CT imaging of the chest was performed using the standard protocol during bolus administration of intravenous contrast. Multiplanar CT image reconstructions and MIPs were obtained to evaluate the vascular anatomy. RADIATION DOSE REDUCTION: This exam was performed according to the departmental dose-optimization program which includes automated exposure control, adjustment of the mA and/or kV according to patient size and/or use of iterative reconstruction technique. CONTRAST:  75mL OMNIPAQUE  IOHEXOL  350 MG/ML SOLN COMPARISON:  Chest CTA 04/25/2022.  Chest radiographs 02/27/2024. FINDINGS: Cardiovascular: The pulmonary arteries are well opacified with contrast to the level of the segmental branches. There is no evidence of acute pulmonary embolism. Atherosclerosis of the aorta, great vessels and coronary arteries without acute systemic  arterial abnormalities. Stable mild cardiomegaly. No significant pericardial fluid. Mediastinum/Nodes: There are no enlarged mediastinal, hilar or axillary lymph nodes.Small mediastinal lymph nodes are stable, likely reactive. The thyroid  gland and esophagus appear stable, without significant findings. Lungs/Pleura: No pleural effusion or pneumothorax. There is new right middle and lower lobe collapse with associated volume loss and endobronchial opacification. There is an oval hyperdense endobronchial component within the bronchus intermedius which measures up to 1.0 cm on coronal image 91/8. This demonstrates density similar to opacified blood vessels and could reflect an aspirated foreign body or a bronchial artery aneurysm. Mild dependent atelectasis in the left lower lobe, similar to previous study. Upper abdomen: No significant findings in the visualized upper abdomen. Musculoskeletal/Chest wall: There is no chest wall mass or suspicious osseous finding. Chronic T12 compression deformity status post spinal augmentation. Review of the MIP images confirms the above findings. IMPRESSION: 1. No evidence of acute pulmonary embolism. 2. New right middle and lower lobe collapse with associated volume loss and endobronchial opacification. There is an oval hyperdense endobronchial component  within the bronchus intermedius which could reflect an aspirated foreign body or a bronchial artery aneurysm. Repeat CT without additional intravenous contrast may be helpful to differentiate these possibilities. 3. Coronary and aortic Atherosclerosis (ICD10-I70.0). Electronically Signed   By: Elmon Hagedorn M.D.   On: 02/27/2024 13:26   US  Venous Img Lower Bilateral (DVT) Result Date: 02/27/2024 CLINICAL DATA:  Right lower extremity pain. EXAM: BILATERAL LOWER EXTREMITY VENOUS DOPPLER ULTRASOUND TECHNIQUE: Gray-scale sonography with graded compression, as well as color Doppler and duplex ultrasound were performed to evaluate the  lower extremity deep venous systems from the level of the common femoral vein and including the common femoral, femoral, profunda femoral, popliteal and calf veins including the posterior tibial, peroneal and gastrocnemius veins when visible. The superficial great saphenous vein was also interrogated. Spectral Doppler was utilized to evaluate flow at rest and with distal augmentation maneuvers in the common femoral, femoral and popliteal veins. COMPARISON:  None Available. FINDINGS: RIGHT LOWER EXTREMITY Common Femoral Vein: No evidence of thrombus. Normal compressibility, respiratory phasicity and response to augmentation. Saphenofemoral Junction: No evidence of thrombus. Normal compressibility and flow on color Doppler imaging. Profunda Femoral Vein: No evidence of thrombus. Normal compressibility and flow on color Doppler imaging. Femoral Vein: No evidence of thrombus. Normal compressibility, respiratory phasicity and response to augmentation. Popliteal Vein: No evidence of thrombus. Normal compressibility, respiratory phasicity and response to augmentation. Calf Veins: No evidence of thrombus. Normal compressibility and flow on color Doppler imaging. Superficial Great Saphenous Vein: No evidence of thrombus. Normal compressibility. Venous Reflux:  None. Other Findings: No evidence of superficial thrombophlebitis or abnormal fluid collection. LEFT LOWER EXTREMITY Common Femoral Vein: No evidence of thrombus. Normal compressibility, respiratory phasicity and response to augmentation. Saphenofemoral Junction: No evidence of thrombus. Normal compressibility and flow on color Doppler imaging. Profunda Femoral Vein: No evidence of thrombus. Normal compressibility and flow on color Doppler imaging. Femoral Vein: No evidence of thrombus. Normal compressibility, respiratory phasicity and response to augmentation. Popliteal Vein: No evidence of thrombus. Normal compressibility, respiratory phasicity and response to  augmentation. Calf Veins: No evidence of thrombus. Normal compressibility and flow on color Doppler imaging. Superficial Great Saphenous Vein: No evidence of thrombus. Normal compressibility. Venous Reflux:  None. Other Findings: No evidence of superficial thrombophlebitis or abnormal fluid collection. IMPRESSION: No evidence of deep venous thrombosis in either lower extremity. Electronically Signed   By: Erica Hau M.D.   On: 02/27/2024 13:23       Donnette Macmullen M.D. Heather [name] 02/29/2024, 12:52 PM  Available via Epic secure chat 7am-7pm After 7 pm, please refer to night coverage provider listed on amion.

## 2024-02-29 NOTE — Progress Notes (Addendum)
 NAME:  Heather [name], MRN:  [id number], DOB:  27-Feb-1931, LOS: 2 ADMISSION DATE:  02/27/2024, CONSULTATION DATE:  02/27/24 REFERRING MD:  Bobbetta Burnet, CHIEF COMPLAINT:  SOB    History of Present Illness:  89 yo F PMH reported/presumed COPD on Breo, NASH cirrhosis, dementia who presented to [facility] ED 02/27/24 w SOB from nursing home. SOB x 4d. Associated hypoxia req O2. She had a CTA PE study in ED which was did not show PE, did show RML RLL collapse/volume loss, and what looks like aspirated foreign body in bronchus intermedius (vs bronchial artery aneurysm).  Admitted to [facility] with plan to transfer to [place] for pulm eval, possible bronch. hile at [facility], seen by palliative care (spoke w pt daughter) and goals of care are full scope of offered tx, full code. This sounds like it is largely based on previous conversation between pt and PCP several years ago.     On 02/28/2024 history taken from the daughter directly..  She reports patient is a widow for the last 10 years.  No prior pulmonary issues.  Not on oxygen  no diagnosis COPD no pulmonary fibrosis no lung cancer no cough no shortness of breath.  For the last 2 years prior to admission she is lived in assisted living because of physical frailty.  For the last 1 year because of worsening knee pain and ongoing lack of motivation and generalized mobility concerns she i more wheelchair dependent.  She is able to push the wheelchair herself.  She is able to self-feed but she does need help with her showers.  She is by enlarge cognitively intact and she has intact bladder and bowel.  There is some memory issues with short-term recall.  But overall physical frailty is the biggest problem.  Prior to admission there was no any fever no nausea vomiting diarrhea,.  No history of any dysphagia.  Then on 02/27/2024 apparently the assisted living staff noticed that she was weaker and she complained of weakness.  She was found to have new onset hypoxemia with pulse ox in the 80s and  brought to the hospital.-CT angiogram chest ruled out pulmonary embolism but showed occlusion of the right lower lobe airway associated with right lower lobe collapse [personally visualized by PCCM MD].  In discussions with Maryan Smalling interventional radiologist DR Ihor Makua concern is for organized clot with the blood vessel inside.  Or endobronchial tumor.  Does not recommend needle biopsy on account of contrast-enhancing blood vessel.  Her daughter reported at admission patient was on 7 L nasal cannula.  Currently weaned to 3 L nasal cannula on 02/28/24.  She is regarded as n.p.o. but speech therapy did give her some graham crackers and applesauce.  Based on this anesthesia request 8-hour n.p.o. and therefore bronchoscopy considered only for 02/29/2024 at 11 AM.   Pertinent  Medical History  Dementia  Presumed COPD  GERD HTN HLD IBS NASH cirrhosis Diastolic HF     has a past medical history of Anxiety, GERD (gastroesophageal reflux disease), shoulder surgery, Other and unspecified hyperlipidemia, and Unspecified essential hypertension.   reports that she has never smoked. She has never used smokeless tobacco.    Significant Hospital Events: Including procedures, antibiotic start and stop dates in addition to other pertinent events   5/6- admit to [facility]. Txf to [place] for pulm consult, possible bronch given CTA chest findings concerning for foreign body aspirated into bronchus intermedius  02/28/2024: Pulmonary consult  Interim History / Subjective:   5/8/ 2 5 -  waits bronch. REmains on 3L [place]. Wanting her momrnig anxiety med. PEr RN - slept well till 4:30. No NVD. No chest pains. No overnight issues. NPO confirmed. Somewhat upset that daughter is not at bedsie as yet. EKG at admit [facility] - similar to 2023. In [facility] - today on monitor. Per daughter -> patient is "out of her mind" . Daugher says she is recalling seeing people who are not there. Daughter thinks this is baseline fluctuation OR  Ammonia level going up and states patient has not gotten her usual home lactulose  this admit. GEts lactulse every 3 days  Per RN - there is no current lactulose  on schedule.    IMMEDIATE PR PROCREDUE - daughter says she chews gum all the time and even sleps with chewing gum Objective   Blood pressure (!) 152/63, pulse 99, temperature (!) 97.5 F (36.4 C), resp. rate 20, height 5\' 7"  (1.702 m), weight 88.8 kg, SpO2 100%.        Intake/Output Summary (Last 24 hours) at 02/29/2024 0852 Last data filed at 02/29/2024 0700 Gross per 24 hour  Intake 290 ml  Output 500 ml  Net -210 ml   Filed Weights   02/27/24 1825 02/28/24 0500 02/29/24 0453  Weight: 91.1 kg 88.1 kg 88.8 kg    Examination: General Appearance:  Looks chronic  unwell Head:  Normocephalic, without obvious abnormality, atraumatic Eyes:  PERRL - yes, conjunctiva/corneas - midd     Ears:  Normal external ear canals, both ears Nose:  G tube - no but has O2 Throat:  ETT TUBE - no , OG tube - no Neck:  Supple,  No enlargement/tenderness/nodules Lungs: Clear to auscultation bilaterally,  Heart:  S1 and S2 normal, no murmur, CVP - no.  Pressors - no Abdomen:  Soft, no masses, no organomegaly Genitalia / Rectal:  Not done Extremities:  Extremities- intac5 Skin:  ntact in exposed areas . Sacral area - none Neurologic:  Sedation - none -> RASS - alert . Moves all 4s - yes.  Confused versus baseline dementia - daughter feels confusion worse and is concerned due to rising Ammonia     LABS    PULMONARY Recent Labs  Lab 02/27/24 1051  PHART 7.46*  PCO2ART 38  PO2ART 66*  HCO3 26.9  O2SAT 95.4    CBC Recent Labs  Lab 02/27/24 1049 02/28/24 0532 02/29/24 0523  HGB 13.2 13.8 14.8  HCT 39.4 40.7 44.9  WBC 19.7* 14.0* 15.1*  PLT 157 143* 175    COAGULATION No results for input(s): "INR" in the last 168 hours.  CARDIAC  No results for input(s): "TROPONINI" in the last 168 hours. No results for input(s):  "PROBNP" in the last 168 hours.   CHEMISTRY Recent Labs  Lab 02/27/24 1049 02/28/24 0532 02/29/24 0523  NA 133* 133* 139  K 4.0 3.4* 3.4*  CL 100 97* 102  CO2 21* 23 23  GLUCOSE 146* 172* 141*  BUN 15 24* 30*  CREATININE 0.99 0.97 0.80  CALCIUM  8.8* 9.0 9.1   Estimated Creatinine Clearance: 53.5 mL/min (by C-G formula based on SCr of 0.8 mg/dL).   LIVER Recent Labs  Lab 02/29/24 0523  AST 25  ALT 26  ALKPHOS 48  BILITOT 0.3  PROT 7.3  ALBUMIN 3.4*     INFECTIOUS Recent Labs  Lab 02/27/24 1049  PROCALCITON <0.10     ENDOCRINE CBG (last 3)  No results for input(s): "GLUCAP" in the last 72 hours.  IMAGING x48h  - image(s) personally visualized  -   highlighted in bold ECHOCARDIOGRAM COMPLETE Result Date: 02/28/2024    ECHOCARDIOGRAM REPORT   Patient Name:   DAMIYAH BYRDSONG Date of Exam: 02/28/2024 Medical Rec #:  [id number]          Height:       67.0 in Accession #:    [id number]         Weight:       194.2 lb Date of Birth:  [birth year]          BSA:          1.997 m Patient Age:    89 years           BP:           110/56 mmHg Patient Gender: F                  HR:           91 bpm. Exam Location:  Inpatient Procedure: 2D Echo, Cardiac Doppler and Color Doppler (Both Spectral and Color            Flow Doppler were utilized during procedure). Indications:    CHF-acute diastolic  History:        Patient has prior history of Echocardiogram examinations, most                 recent 04/26/2022. Signs/Symptoms:Chest Pain; Risk                 Factors:Hypertension.  Sonographer:    Juanita Shaw Referring Phys: [id number] RAMESH KC IMPRESSIONS  1. Left ventricular ejection fraction, by estimation, is 65 to 70%. The left ventricle has normal function. The left ventricle has no regional wall motion abnormalities. Left ventricular diastolic parameters are indeterminate.  2. Right ventricular systolic function is normal. The right ventricular size is normal. There is normal  pulmonary artery systolic pressure.  3. The mitral valve is normal in structure. Mild mitral valve regurgitation. No evidence of mitral stenosis.  4. The aortic valve is normal in structure. Aortic valve regurgitation is not visualized. No aortic stenosis is present.  5. The inferior vena cava is normal in size with greater than 50% respiratory variability, suggesting right atrial pressure of 3 mmHg. FINDINGS  Left Ventricle: Left ventricular ejection fraction, by estimation, is 65 to 70%. The left ventricle has normal function. The left ventricle has no regional wall motion abnormalities. The left ventricular internal cavity size was normal in size. There is  no left ventricular hypertrophy. Left ventricular diastolic parameters are indeterminate. Right Ventricle: The right ventricular size is normal. No increase in right ventricular wall thickness. Right ventricular systolic function is normal. There is normal pulmonary artery systolic pressure. The tricuspid regurgitant velocity is 2.22 m/s, and  with an assumed right atrial pressure of 3 mmHg, the estimated right ventricular systolic pressure is 22.7 mmHg. Left Atrium: Left atrial size was normal in size. Right Atrium: Right atrial size was normal in size. Pericardium: There is no evidence of pericardial effusion. Mitral Valve: The mitral valve is normal in structure. Mild mitral valve regurgitation. No evidence of mitral valve stenosis. MV peak gradient, 10.1 mmHg. The mean mitral valve gradient is 5.0 mmHg. Tricuspid Valve: The tricuspid valve is normal in structure. Tricuspid valve regurgitation is not demonstrated. No evidence of tricuspid stenosis. Aortic Valve: The aortic valve is normal in structure. Aortic valve regurgitation is not visualized. No aortic stenosis is present. Aortic  valve mean gradient measures 4.0 mmHg. Aortic valve peak gradient measures 7.5 mmHg. Aortic valve area, by VTI measures 2.54 cm. Pulmonic Valve: The pulmonic valve was normal  in structure. Pulmonic valve regurgitation is not visualized. No evidence of pulmonic stenosis. Aorta: The aortic root is normal in size and structure. Venous: The inferior vena cava is normal in size with greater than 50% respiratory variability, suggesting right atrial pressure of 3 mmHg. IAS/Shunts: No atrial level shunt detected by color flow Doppler.  LEFT VENTRICLE PLAX 2D LVIDd:         4.30 cm LVIDs:         2.80 cm LV PW:         1.00 cm LV IVS:        0.90 cm LVOT diam:     1.80 cm LV SV:         51 LV SV Index:   26 LVOT Area:     2.54 cm  LV Volumes (MOD) LV vol d, MOD A2C: 87.6 ml LV vol d, MOD A4C: 102.0 ml LV vol s, MOD A2C: 20.0 ml LV vol s, MOD A4C: 38.8 ml LV SV MOD A2C:     67.6 ml LV SV MOD A4C:     102.0 ml LV SV MOD BP:      64.4 ml RIGHT VENTRICLE             IVC RV Basal diam:  3.00 cm     IVC diam: 1.30 cm RV Mid diam:    2.20 cm RV S prime:     13.30 cm/s TAPSE (M-mode): 2.0 cm LEFT ATRIUM             Index        RIGHT ATRIUM          Index LA diam:        3.30 cm 1.65 cm/m   RA Area:     7.94 cm LA Vol (A2C):   26.5 ml 13.27 ml/m  RA Volume:   13.30 ml 6.66 ml/m LA Vol (A4C):   47.4 ml 23.73 ml/m LA Biplane Vol: 35.9 ml 17.97 ml/m  AORTIC VALVE                    PULMONIC VALVE AV Area (Vmax):    1.99 cm     PV Vmax:       1.01 m/s AV Area (Vmean):   2.06 cm     PV Peak grad:  4.1 mmHg AV Area (VTI):     2.54 cm AV Vmax:           137.00 cm/s AV Vmean:          87.000 cm/s AV VTI:            0.202 m AV Peak Grad:      7.5 mmHg AV Mean Grad:      4.0 mmHg LVOT Vmax:         107.00 cm/s LVOT Vmean:        70.300 cm/s LVOT VTI:          0.202 m LVOT/AV VTI ratio: 1.00  AORTA Ao Root diam: 3.10 cm Ao Asc diam:  3.40 cm MITRAL VALVE                TRICUSPID VALVE MV Area (PHT): 3.87 cm     TR Peak grad:   19.7 mmHg MV Area VTI:   2.27 cm  TR Vmax:        222.00 cm/s MV Peak grad:  10.1 mmHg MV Mean grad:  5.0 mmHg     SHUNTS MV Vmax:       1.59 m/s     Systemic VTI:  0.20 m MV  Vmean:      107.0 cm/s   Systemic Diam: 1.80 cm MV Decel Time: 196 msec MV E velocity: 118.00 cm/s Dorothye Gathers MD Electronically signed by Dorothye Gathers MD Signature Date/Time: 02/28/2024/4:48:19 PM    Final    DG Chest Port 1 View Result Date: 02/28/2024 CLINICAL DATA:  CHF EXAM: PORTABLE CHEST 1 VIEW COMPARISON:  Feb 27, 2024 FINDINGS: Minimal residual bilateral interstitial prominence correlate with subtle residual congestive changes with right lower lobe hypoventilatory atelectasis No pleural effusion Heart and mediastinum normal IMPRESSION: Minimal residual congestive changes with right lower lobe hypoventilatory atelectasis. Electronically Signed   By: Fredrich Jefferson M.D.   On: 02/28/2024 08:39   CT Angio Chest PE W and/or Wo Contrast Result Date: 02/27/2024 CLINICAL DATA:  Pulmonary embolism (PE) suspected, low to intermediate prob, positive D-dimer Generalized weakness with shortness of breath for 2 days. Elevated D-dimer levels. EXAM: CT ANGIOGRAPHY CHEST WITH CONTRAST TECHNIQUE: Multidetector CT imaging of the chest was performed using the standard protocol during bolus administration of intravenous contrast. Multiplanar CT image reconstructions and MIPs were obtained to evaluate the vascular anatomy. RADIATION DOSE REDUCTION: This exam was performed according to the departmental dose-optimization program which includes automated exposure control, adjustment of the mA and/or kV according to patient size and/or use of iterative reconstruction technique. CONTRAST:  75mL OMNIPAQUE  IOHEXOL  350 MG/ML SOLN COMPARISON:  Chest CTA 04/25/2022.  Chest radiographs 02/27/2024. FINDINGS: Cardiovascular: The pulmonary arteries are well opacified with contrast to the level of the segmental branches. There is no evidence of acute pulmonary embolism. Atherosclerosis of the aorta, great vessels and coronary arteries without acute systemic arterial abnormalities. Stable mild cardiomegaly. No significant pericardial fluid.  Mediastinum/Nodes: There are no enlarged mediastinal, hilar or axillary lymph nodes.Small mediastinal lymph nodes are stable, likely reactive. The thyroid  gland and esophagus appear stable, without significant findings. Lungs/Pleura: No pleural effusion or pneumothorax. There is new right middle and lower lobe collapse with associated volume loss and endobronchial opacification. There is an oval hyperdense endobronchial component within the bronchus intermedius which measures up to 1.0 cm on coronal image 91/8. This demonstrates density similar to opacified blood vessels and could reflect an aspirated foreign body or a bronchial artery aneurysm. Mild dependent atelectasis in the left lower lobe, similar to previous study. Upper abdomen: No significant findings in the visualized upper abdomen. Musculoskeletal/Chest wall: There is no chest wall mass or suspicious osseous finding. Chronic T12 compression deformity status post spinal augmentation. Review of the MIP images confirms the above findings. IMPRESSION: 1. No evidence of acute pulmonary embolism. 2. New right middle and lower lobe collapse with associated volume loss and endobronchial opacification. There is an oval hyperdense endobronchial component within the bronchus intermedius which could reflect an aspirated foreign body or a bronchial artery aneurysm. Repeat CT without additional intravenous contrast may be helpful to differentiate these possibilities. 3. Coronary and aortic Atherosclerosis (ICD10-I70.0). Electronically Signed   By: Elmon Hagedorn M.D.   On: 02/27/2024 13:26   US  Venous Img Lower Bilateral (DVT) Result Date: 02/27/2024 CLINICAL DATA:  Right lower extremity pain. EXAM: BILATERAL LOWER EXTREMITY VENOUS DOPPLER ULTRASOUND TECHNIQUE: Gray-scale sonography with graded compression, as well as color Doppler and duplex ultrasound were  performed to evaluate the lower extremity deep venous systems from the level of the common femoral vein and  including the common femoral, femoral, profunda femoral, popliteal and calf veins including the posterior tibial, peroneal and gastrocnemius veins when visible. The superficial great saphenous vein was also interrogated. Spectral Doppler was utilized to evaluate flow at rest and with distal augmentation maneuvers in the common femoral, femoral and popliteal veins. COMPARISON:  None Available. FINDINGS: RIGHT LOWER EXTREMITY Common Femoral Vein: No evidence of thrombus. Normal compressibility, respiratory phasicity and response to augmentation. Saphenofemoral Junction: No evidence of thrombus. Normal compressibility and flow on color Doppler imaging. Profunda Femoral Vein: No evidence of thrombus. Normal compressibility and flow on color Doppler imaging. Femoral Vein: No evidence of thrombus. Normal compressibility, respiratory phasicity and response to augmentation. Popliteal Vein: No evidence of thrombus. Normal compressibility, respiratory phasicity and response to augmentation. Calf Veins: No evidence of thrombus. Normal compressibility and flow on color Doppler imaging. Superficial Great Saphenous Vein: No evidence of thrombus. Normal compressibility. Venous Reflux:  None. Other Findings: No evidence of superficial thrombophlebitis or abnormal fluid collection. LEFT LOWER EXTREMITY Common Femoral Vein: No evidence of thrombus. Normal compressibility, respiratory phasicity and response to augmentation. Saphenofemoral Junction: No evidence of thrombus. Normal compressibility and flow on color Doppler imaging. Profunda Femoral Vein: No evidence of thrombus. Normal compressibility and flow on color Doppler imaging. Femoral Vein: No evidence of thrombus. Normal compressibility, respiratory phasicity and response to augmentation. Popliteal Vein: No evidence of thrombus. Normal compressibility, respiratory phasicity and response to augmentation. Calf Veins: No evidence of thrombus. Normal compressibility and flow on  color Doppler imaging. Superficial Great Saphenous Vein: No evidence of thrombus. Normal compressibility. Venous Reflux:  None. Other Findings: No evidence of superficial thrombophlebitis or abnormal fluid collection. IMPRESSION: No evidence of deep venous thrombosis in either lower extremity. Electronically Signed   By: Erica Hau M.D.   On: 02/27/2024 13:23   DG Chest Port 1 View Result Date: 02/27/2024 CLINICAL DATA:  Shortness of breath EXAM: PORTABLE CHEST 1 VIEW COMPARISON:  January 27, 2022 FINDINGS: The heart size and mediastinal contours are within normal limits. Both lungs are clear. The visualized skeletal structures are unremarkable. Mild equalization flow towards the apices and interstitial prominence could correlate with subtle congestive changes with a small bilateral pleural reactions IMPRESSION: Mild congestive changes with small bilateral pleural reactions. Electronically Signed   By: Fredrich Jefferson M.D.   On: 02/27/2024 10:53     Resolved Hospital Problem list     Assessment & Plan:   Acute respiratory failure with hypoxia -due to below requiring 3 L RML RLL collapse, aspiration into airway (bronchus intermedius) (vs ddx: bronchial artery aneurysm)  - Per Dr. Ihor Makua interventional radiologist on 02/28/2024: Features are not consistent with a endobronchial arterial aneurysm but more consistent with occluded thrombus with the blood vessel inside   INcease in baseline confusion 02/29/24 morning of bronch   Plan  - give lactulose  home dose x 1 now  - check stat EKG -c heck stat Trop, BNP, mag, phos - check stat ammonia  - hold lovenox   - postpone bronch by 2h so results can be obtained before decision on bronch  - continue NPO otherwise  - if labs concerning will have to cancel bronch  Best Practice (right click and "Reselect all SmartList Selections" daily)  According to the hospitalist   D/w dauighter, RN and endo tech   SIGNATURE    Dr. Maire Scot,  M.D., F.C.C.P,  Pulmonary and Critical Care Medicine Staff Physician, [facility] Center Director - Interstitial Lung Disease  Program  Pulmonary Fibrosis [facility] at [facility] [place], [place], [address]   Pager: [phone], If no answer  -> Check AMION or Try [phone] Telephone (clinical office): [phone] Telephone (research): [phone]  8:52 AM 02/29/2024

## 2024-02-29 NOTE — Transfer of Care (Signed)
 Immediate Anesthesia Transfer of Care Note  Patient: Heather [name]  Procedure(s) Performed: VIDEO BRONCHOSCOPY WITHOUT FLUORO IRRIGATION, BRONCHUS BIOPSY, LUNG  Patient Location: PACU  Anesthesia Type:General  Level of Consciousness: awake  Airway & Oxygen  Therapy: non-rebreather face mask  Post-op Assessment: Report given to RN and Post -op Vital signs reviewed and stable  Post vital signs: Reviewed and stable  Last Vitals:  Vitals Value Taken Time  BP 153/71 02/29/24 1407  Temp    Pulse 90 02/29/24 1410  Resp 20 02/29/24 1410  SpO2 95 % 02/29/24 1410  Vitals shown include unfiled device data.  Last Pain:  Vitals:   02/29/24 1251  TempSrc: Temporal  PainSc: 0-No pain         Complications: No notable events documented.

## 2024-02-29 NOTE — Anesthesia Procedure Notes (Signed)
 Procedure Name: Intubation Date/Time: 02/29/2024 1:31 PM  Performed by: Jinny Mounts, CRNAPre-anesthesia Checklist: Patient identified, Emergency Drugs available, Suction available and Patient being monitored Patient Re-evaluated:Patient Re-evaluated prior to induction Oxygen  Delivery Method: Circle System Utilized Preoxygenation: Pre-oxygenation with 100% oxygen  Induction Type: IV induction Ventilation: Mask ventilation without difficulty and Oral airway inserted - appropriate to patient size Laryngoscope Size: Mac and 3 Grade View: Grade I Tube type: Oral Tube size: 8.5 mm Number of attempts: 1 Airway Equipment and Method: Stylet and Oral airway Placement Confirmation: ETT inserted through vocal cords under direct vision, positive ETCO2 and breath sounds checked- equal and bilateral Secured at: 22 cm Tube secured with: Tape Dental Injury: Teeth and Oropharynx as per pre-operative assessment

## 2024-03-01 ENCOUNTER — Encounter (HOSPITAL_COMMUNITY): Payer: Self-pay | Admitting: Internal Medicine

## 2024-03-01 DIAGNOSIS — J9601 Acute respiratory failure with hypoxia: Secondary | ICD-10-CM | POA: Diagnosis not present

## 2024-03-01 DIAGNOSIS — K7581 Nonalcoholic steatohepatitis (NASH): Secondary | ICD-10-CM | POA: Diagnosis not present

## 2024-03-01 DIAGNOSIS — R4589 Other symptoms and signs involving emotional state: Secondary | ICD-10-CM

## 2024-03-01 DIAGNOSIS — Z66 Do not resuscitate: Secondary | ICD-10-CM

## 2024-03-01 DIAGNOSIS — Z515 Encounter for palliative care: Secondary | ICD-10-CM

## 2024-03-01 DIAGNOSIS — K7460 Unspecified cirrhosis of liver: Secondary | ICD-10-CM | POA: Diagnosis not present

## 2024-03-01 DIAGNOSIS — I10 Essential (primary) hypertension: Secondary | ICD-10-CM | POA: Diagnosis not present

## 2024-03-01 DIAGNOSIS — Z7189 Other specified counseling: Secondary | ICD-10-CM

## 2024-03-01 LAB — COMPREHENSIVE METABOLIC PANEL WITH GFR
ALT: 38 U/L (ref 0–44)
AST: 41 U/L (ref 15–41)
Albumin: 3 g/dL — ABNORMAL LOW (ref 3.5–5.0)
Alkaline Phosphatase: 44 U/L (ref 38–126)
Anion gap: 11 (ref 5–15)
BUN: 38 mg/dL — ABNORMAL HIGH (ref 8–23)
CO2: 22 mmol/L (ref 22–32)
Calcium: 8.6 mg/dL — ABNORMAL LOW (ref 8.9–10.3)
Chloride: 105 mmol/L (ref 98–111)
Creatinine, Ser: 0.84 mg/dL (ref 0.44–1.00)
GFR, Estimated: >60 mL/min (ref >60)
Glucose, Bld: 156 mg/dL — ABNORMAL HIGH (ref 70–99)
Potassium: 3.9 mmol/L (ref 3.5–5.1)
Sodium: 138 mmol/L (ref 135–145)
Total Bilirubin: 0.6 mg/dL (ref 0.0–1.2)
Total Protein: 6.6 g/dL (ref 6.5–8.1)

## 2024-03-01 LAB — CBC
HCT: 39.2 % (ref 36.0–46.0)
Hemoglobin: 13.2 g/dL (ref 12.0–15.0)
MCH: 31.2 pg (ref 26.0–34.0)
MCHC: 33.7 g/dL (ref 30.0–36.0)
MCV: 92.7 fL (ref 80.0–100.0)
Platelets: 173 10*3/uL (ref 150–400)
RBC: 4.23 MIL/uL (ref 3.87–5.11)
RDW: 13.1 % (ref 11.5–15.5)
WBC: 11.2 10*3/uL — ABNORMAL HIGH (ref 4.0–10.5)
nRBC: 0 % (ref 0.0–0.2)

## 2024-03-01 LAB — AMMONIA: Ammonia: 19 umol/L (ref 9–35)

## 2024-03-01 LAB — PHOSPHORUS: Phosphorus: 3.1 mg/dL (ref 2.5–4.6)

## 2024-03-01 LAB — MAGNESIUM: Magnesium: 2.5 mg/dL — ABNORMAL HIGH (ref 1.7–2.4)

## 2024-03-01 MED ORDER — PREDNISONE 20 MG PO TABS
40.0000 mg | ORAL_TABLET | Freq: Every day | ORAL | Status: DC
  Administered 2024-03-02 – 2024-03-04 (×3): 40 mg via ORAL
  Filled 2024-03-01 (×3): qty 2

## 2024-03-01 MED ORDER — QUETIAPINE FUMARATE 25 MG PO TABS
12.5000 mg | ORAL_TABLET | Freq: Every day | ORAL | Status: DC
  Administered 2024-03-02: 12.5 mg via ORAL
  Filled 2024-03-01 (×2): qty 1

## 2024-03-01 MED ORDER — QUETIAPINE FUMARATE 25 MG PO TABS
12.5000 mg | ORAL_TABLET | Freq: Every morning | ORAL | Status: DC
  Administered 2024-03-01: 12.5 mg via ORAL
  Filled 2024-03-01: qty 1

## 2024-03-01 MED ORDER — HALOPERIDOL LACTATE 5 MG/ML IJ SOLN
1.0000 mg | Freq: Four times a day (QID) | INTRAMUSCULAR | Status: DC | PRN
  Administered 2024-03-01 (×2): 1 mg via INTRAVENOUS
  Filled 2024-03-01 (×2): qty 1

## 2024-03-01 MED ORDER — MELATONIN 3 MG PO TABS
3.0000 mg | ORAL_TABLET | Freq: Every evening | ORAL | Status: DC | PRN
  Administered 2024-03-01: 3 mg via ORAL
  Filled 2024-03-01: qty 1

## 2024-03-01 MED ORDER — QUETIAPINE FUMARATE 25 MG PO TABS: 25.0000 mg | ORAL_TABLET | Freq: Every day | ORAL | Status: DC

## 2024-03-01 MED ORDER — QUETIAPINE FUMARATE 25 MG PO TABS: 50.0000 mg | ORAL_TABLET | Freq: Every day | ORAL | Status: DC

## 2024-03-01 MED ORDER — GABAPENTIN 300 MG PO CAPS
300.0000 mg | ORAL_CAPSULE | Freq: Two times a day (BID) | ORAL | Status: DC
  Administered 2024-03-01 – 2024-03-04 (×6): 300 mg via ORAL
  Filled 2024-03-01 (×6): qty 1

## 2024-03-01 NOTE — Progress Notes (Signed)
 NAME:  Heather [name], MRN:  [id number], DOB:  27-Jun-1931, LOS: 3 ADMISSION DATE:  02/27/2024, CONSULTATION DATE:  02/27/24 REFERRING MD:  Lesa Rape, CHIEF COMPLAINT:  SOB   History of Present Illness:  89 yo F PMH reported/presumed COPD on Breo, NASH cirrhosis, dementia who presented to [facility] ED 02/27/24 w SOB from nursing home. SOB x 4d. Associated hypoxia req O2. She had a CTA PE study in ED which was did not show PE, did show RML RLL collapse/volume loss, and what looks like aspirated foreign body in bronchus intermedius (vs bronchial artery aneurysm). Admitted to [facility] with plan to transfer to [place] for pulm eval, possible bronch. hile at [facility], seen by palliative care (spoke w pt daughter) and goals of care are full scope of offered tx, full code. This sounds like it is largely based on previous conversation between pt and PCP several years ago.    CT angiogram chest ruled out pulmonary embolism but showed occlusion of the right lower lobe airway associated with right lower lobe collapse [personally visualized by PCCM MD].  In discussions with Maryan Smalling interventional radiologist DR Ihor Makua concern is for organized clot with the blood vessel inside.  Or endobronchial tumor.  Does not recommend needle biopsy on account of contrast-enhancing blood vessel.  She under went bronchoscopy 02/29/24 with endobronchial lesion noted at distal end of bronchus intermedius with polypoid and friable appearance. Not pulsatile. Endobronchial biopsies performed x 3. No obvious food material noted. BAL completed in th RLL and RML.  Pertinent  Medical History  Dementia  Presumed COPD  GERD HTN HLD IBS NASH cirrhosis Diastolic HF      has a past medical history of Anxiety, GERD (gastroesophageal reflux disease), shoulder surgery, Other and unspecified hyperlipidemia, and Unspecified essential hypertension.    reports that she has never smoked. She has never used smokeless tobacco.  Significant Hospital  Events: Including procedures, antibiotic start and stop dates in addition to other pertinent events   5/6 admitted 5/7 PCCM consulted 5/8 s/p bronchoscopy with endobronchial biopsies and BALs  Interim History / Subjective:   No hemoptysis today or sputum production Took off her nasal canula and she maintained SpO2 89% and higher, mostly 91-94% on room air while talking.  Daughter at bedside  Objective    Blood pressure (!) 145/65, pulse 88, temperature 97.9 F (36.6 C), temperature source Oral, resp. rate 18, height 5\' 7"  (1.702 m), weight 88.9 kg, SpO2 93%.        Intake/Output Summary (Last 24 hours) at 03/01/2024 0732 Last data filed at 02/29/2024 1612 Gross per 24 hour  Intake 640 ml  Output 5 ml  Net 635 ml   Filed Weights   02/29/24 0453 02/29/24 1251 03/01/24 0500  Weight: 88.8 kg 88.8 kg 88.9 kg    Examination: General: elderly woman, no acute distress HENT: [place]/AT, moist mucous membranes Lungs: clear to auscultaiton, no wheezing Cardiovascular: rrr, no murmurs Abdomen: soft, non-tender, non-distended Extremities: warm, no edema Neuro: alert, but not oriented GU: n/a  Resolved Hospital Problem list     Assessment & Plan:   Acute Hypoxemic Respiratory Failure RML and RLL Collapse due to endobronchial lesion Presumed COPD Liver Cirrhosis secondary to NASH Dementia  Plan: - Rare gram negative rods on BAL gram stain - Continue ceftriaxone , can transition to augmentin  to complete a total of 10 days of antibiotics - Will follow up cultures and cytology - continue budesonide , brovana  and yupelri  nebs.  - transition methylprednisolone  to PO  prednisone  - goal SpO2 88% or higher  PCCM will continue to follow  Best Practice (right click and "Reselect all SmartList Selections" daily)   Per primary team  Labs   CBC: Recent Labs  Lab 02/27/24 1049 02/28/24 0532 02/29/24 0523 03/01/24 0518  WBC 19.7* 14.0* 15.1* 11.2*  HGB 13.2 13.8 14.8 13.2  HCT 39.4  40.7 44.9 39.2  MCV 91.6 92.9 94.1 92.7  PLT 157 143* 175 173    Basic Metabolic Panel: Recent Labs  Lab 02/27/24 1049 02/28/24 0532 02/29/24 0523 02/29/24 1019 03/01/24 0518  NA 133* 133* 139  --  138  K 4.0 3.4* 3.4*  --  3.9  CL 100 97* 102  --  105  CO2 21* 23 23  --  22  GLUCOSE 146* 172* 141*  --  156*  BUN 15 24* 30*  --  38*  CREATININE 0.99 0.97 0.80  --  0.84  CALCIUM  8.8* 9.0 9.1  --  8.6*  MG  --   --   --  2.3 2.5*  PHOS  --   --   --  2.8 3.1   GFR: Estimated Creatinine Clearance: 50.9 mL/min (by C-G formula based on SCr of 0.84 mg/dL). Recent Labs  Lab 02/27/24 1049 02/28/24 0532 02/29/24 0523 03/01/24 0518  PROCALCITON <0.10  --   --   --   WBC 19.7* 14.0* 15.1* 11.2*    Liver Function Tests: Recent Labs  Lab 02/29/24 0523 03/01/24 0518  AST 25 41  ALT 26 38  ALKPHOS 48 44  BILITOT 0.3 0.6  PROT 7.3 6.6  ALBUMIN 3.4* 3.0*   No results for input(s): "LIPASE", "AMYLASE" in the last 168 hours. Recent Labs  Lab 02/27/24 1049 02/29/24 1019 03/01/24 0518  AMMONIA 37* 17 19    ABG    Component Value Date/Time   PHART 7.46 (H) 02/27/2024 1051   PCO2ART 38 02/27/2024 1051   PO2ART 66 (L) 02/27/2024 1051   HCO3 26.9 02/27/2024 1051   O2SAT 95.4 02/27/2024 1051     Coagulation Profile: No results for input(s): "INR", "PROTIME" in the last 168 hours.  Cardiac Enzymes: No results for input(s): "CKTOTAL", "CKMB", "CKMBINDEX", "TROPONINI" in the last 168 hours.  HbA1C: No results found for: "HGBA1C"  CBG: No results for input(s): "GLUCAP" in the last 168 hours.     Critical care time: n/a    Duaine German, MD [facility] Office: [phone]   See Amion for personal pager PCCM on call pager [phone] until 7pm. Please call Elink 7p-7a. [phone]

## 2024-03-01 NOTE — Progress Notes (Addendum)
 [facility]                                                                              Betselot [name], is a 89 y.o. female, DOB - June 21, 1931, VOZ:[id number] Admit date - 02/27/2024    Outpatient Primary MD for the patient is [name], [name], MD  LOS - 3  days  Chief Complaint  Patient presents with   Shortness of Breath       Brief summary   Patient is a 89 year old female with anxiety, GERD, HLD, HTN, HLD, IBS, compression fracture of L1, NASH cirrhosis, arthritis/debility previous history of respiratory failure in 2023, History of ?COPD on  Breo, previous admission on 02/24/2023 for COPD exacerbation/pneumonia  Presented to the ED from [facility] [place] with generalized weakness, shortness of breath since last Sunday.On presentation hypoxia 80% on RA-placed on supplemental oxygen  up to 7 L, In the ED patient felt to be poor historian, tachypneic up to 30 BP stable.  Labs showed ABG PCO238 PO2.4PO 266, mild hyponatremia ammonia 37 BNP 179 leukocytosis 19.7. CXR> mild congestive changes with small bilateral pleural effusions.Aaron Aas   COVID RSV flu negative Patient was given Solu-Medrol  125, DuoNeb IV Lasix  and admission was requested. D-dimer elevated, CTA chest showed no evidence of acute PE, new right middle and lower lobe collapse with associated volume loss and endobronchial opacification.  There is an oval hyperdense endobronchial component with an the bronchus intermedius which could reflect an aspirated foreign body or bronchial artery aneurysm. PCCM consulted  Assessment & Plan    Principal Problem:   Acute hypoxic respiratory failure (HCC) COPD with acute exacerbation, right middle lobe and lower lobe collapse -Presented with wheezing, shortness of breath, hypoxia - Chest x-ray with congestive changes with right lower lobe hypoventilatory atelectasis - D-dimer elevated, CTA showed new right middle and lower lobe collapse with associated volume loss and  endobronchial opacification -  SLP eval 5/7  dysphagia 3 diet, thin liquids - 2D echo showed EF of 65 to 70%, no regional WMA - Underwent bronchoscopy on 5/7 with endobronchial lesion, status post biopsy and BAL.  Pulmonology following. - Follow cultures, rare gram-negative rods, continue IV Rocephin   Active Problems: Mild acute diastolic CHF - 2D echo showed EF of 65 to 70%, indeterminate diastolic parameters - Chest x-ray on admission had shown small pleural effusion, received IV Lasix   Delirium, sundowning superimposed on dementia - Palliative medicine consulted for goals of care. - Patient has been delirious, with sundowning and agitation, required Haldol  overnight - cont Seroquel  to 12.5 mg at bedtime and added Seroquel  12.5 mg in the a.m. - Delirium precautions Addendum: 3:40pm - adding gabapentin  300mg  BID (home med), will hold am seroquel  .  Right leg pain -Venous Dopplers showed no evidence of DVT in either extremity  Leukocytosis - Improving, continue IV Rocephin    GERD - Continue PPI    Liver cirrhosis secondary to NASH (HCC) - Ammonia level improved after MiraLAX     Essential hypertension BP stable    Obesity class I Estimated body mass index is 30.7 kg/m as calculated from the following:   Height as of this encounter:  5\' 7"  (1.702 m).   Weight as of this encounter: 88.9 kg.  Code Status: Full code DVT Prophylaxis:     Level of Care: Level of care: Progressive Family Communication: Updated patient Disposition Plan:      Remains inpatient appropriate: Hopefully DC back to ALF on Monday   Procedures:    Consultants:   PCCM Palliative Antimicrobials:   Anti-infectives (From admission, onward)    Start     Dose/Rate Route Frequency Ordered Stop   02/29/24 1600  fluconazole  (DIFLUCAN ) tablet 100 mg        100 mg Oral Daily 02/29/24 1459 03/07/24 0959   02/29/24 1415  fluconazole  (DIFLUCAN ) IVPB 100 mg  Status:  Discontinued        100 mg 50  mL/hr over 60 Minutes Intravenous Every 24 hours 02/29/24 1401 02/29/24 1458   02/27/24 1600  cefTRIAXone  (ROCEPHIN ) 1 g in sodium chloride  0.9 % 100 mL IVPB        1 g 200 mL/hr over 30 Minutes Intravenous Every 24 hours 02/27/24 1430            Medications  albuterol   5 mg/hr Nebulization Once   ALPRAZolam   1 mg Oral BID   arformoterol   15 mcg Nebulization BID   aspirin   81 mg Oral Q breakfast   budesonide  (PULMICORT ) nebulizer solution  0.25 mg Nebulization BID   Chlorhexidine  Gluconate Cloth  6 each Topical Q0600   ferrous sulfate   325 mg Oral Q breakfast   fluconazole   100 mg Oral Daily   furosemide   20 mg Intravenous Daily   isosorbide  mononitrate  60 mg Oral Daily   metoprolol  tartrate  25 mg Oral BID   mupirocin  ointment   Nasal BID   pantoprazole   40 mg Oral Daily   [START ON 03/02/2024] predniSONE   40 mg Oral QAC breakfast   QUEtiapine   12.5 mg Oral q morning   QUEtiapine   50 mg Oral QHS   revefenacin   175 mcg Nebulization Daily      Subjective:   Heather [name] was seen and examined today.  Alert, talking tangentially.  Overnight was agitated with sundowning, received Haldol  1 mg IV at 3 AM  No fever chills, chest pain, shortness of breath, nausea or vomiting.  Objective:   Vitals:   03/01/24 0500 03/01/24 0610 03/01/24 0818 03/01/24 1336  BP:  (!) 145/65  127/60  Pulse:  88  88  Resp:  18  18  Temp:  97.9 F (36.6 C)  97.8 F (36.6 C)  TempSrc:  Oral  Oral  SpO2:  93% 95% 91%  Weight: 88.9 kg     Height:        Intake/Output Summary (Last 24 hours) at 03/01/2024 1402 Last data filed at 02/29/2024 1612 Gross per 24 hour  Intake 640 ml  Output --  Net 640 ml     Wt Readings from Last 3 Encounters:  03/01/24 88.9 kg  04/09/23 78.9 kg  04/25/22 77.9 kg   Physical Exam General: Alert and awake, confused, tangential Cardiovascular: S1 S2 clear, RRR.  Respiratory: dec BS at bases  Gastrointestinal: Soft, nontender, nondistended, NBS Ext: no  pedal edema bilaterally Neuro: no new deficits Psych confused   Data Reviewed:  I have personally reviewed following labs    CBC Lab Results  Component Value Date   WBC 11.2 (H) 03/01/2024   RBC 4.23 03/01/2024   HGB 13.2 03/01/2024   HCT 39.2 03/01/2024   MCV 92.7 03/01/2024  MCH 31.2 03/01/2024   PLT 173 03/01/2024   MCHC 33.7 03/01/2024   RDW 13.1 03/01/2024   LYMPHSABS 2.7 04/09/2023   MONOABS 0.8 04/09/2023   EOSABS 0.3 04/09/2023   BASOSABS 0.1 04/09/2023     Last metabolic panel Lab Results  Component Value Date   NA 138 03/01/2024   K 3.9 03/01/2024   CL 105 03/01/2024   CO2 22 03/01/2024   BUN 38 (H) 03/01/2024   CREATININE 0.84 03/01/2024   GLUCOSE 156 (H) 03/01/2024   GFRNONAA >60 03/01/2024   GFRAA >60 03/18/2019   CALCIUM  8.6 (L) 03/01/2024   PHOS 3.1 03/01/2024   PROT 6.6 03/01/2024   ALBUMIN 3.0 (L) 03/01/2024   BILITOT 0.6 03/01/2024   ALKPHOS 44 03/01/2024   AST 41 03/01/2024   ALT 38 03/01/2024   ANIONGAP 11 03/01/2024    CBG (last 3)  No results for input(s): "GLUCAP" in the last 72 hours.    Coagulation Profile: No results for input(s): "INR", "PROTIME" in the last 168 hours.   Radiology Studies: I have personally reviewed the imaging studies  DG Chest Port 1 View Result Date: 02/29/2024 CLINICAL DATA:  Status post bronchoscopy. EXAM: PORTABLE CHEST 1 VIEW COMPARISON:  Chest radiograph dated 02/28/2024. FINDINGS: Shallow inspiration with bibasilar atelectasis. No focal consolidation, pleural effusion or pneumothorax. The cardiac silhouette. Atherosclerotic calcification of the aorta. Osteopenia with degenerative changes of the spine. No acute osseous pathology. IMPRESSION: Shallow inspiration with bibasilar atelectasis. No pneumothorax. Electronically Signed   By: Angus Bark M.D.   On: 02/29/2024 14:27   ECHOCARDIOGRAM COMPLETE Result Date: 02/28/2024    ECHOCARDIOGRAM REPORT   Patient Name:   BOBBETTE UFFORD Date of Exam:  02/28/2024 Medical Rec #:  [id number]          Height:       67.0 in Accession #:    [id number]         Weight:       194.2 lb Date of Birth:  Nov 24, 1930          BSA:          1.997 m Patient Age:    89 years           BP:           110/56 mmHg Patient Gender: F                  HR:           91 bpm. Exam Location:  Inpatient Procedure: 2D Echo, Cardiac Doppler and Color Doppler (Both Spectral and Color            Flow Doppler were utilized during procedure). Indications:    CHF-acute diastolic  History:        Patient has prior history of Echocardiogram examinations, most                 recent 04/26/2022. Signs/Symptoms:Chest Pain; Risk                 Factors:Hypertension.  Sonographer:    Juanita Shaw Referring Phys: [id number] RAMESH KC IMPRESSIONS  1. Left ventricular ejection fraction, by estimation, is 65 to 70%. The left ventricle has normal function. The left ventricle has no regional wall motion abnormalities. Left ventricular diastolic parameters are indeterminate.  2. Right ventricular systolic function is normal. The right ventricular size is normal. There is normal pulmonary artery systolic pressure.  3. The mitral valve is normal in structure. Mild  mitral valve regurgitation. No evidence of mitral stenosis.  4. The aortic valve is normal in structure. Aortic valve regurgitation is not visualized. No aortic stenosis is present.  5. The inferior vena cava is normal in size with greater than 50% respiratory variability, suggesting right atrial pressure of 3 mmHg. FINDINGS  Left Ventricle: Left ventricular ejection fraction, by estimation, is 65 to 70%. The left ventricle has normal function. The left ventricle has no regional wall motion abnormalities. The left ventricular internal cavity size was normal in size. There is  no left ventricular hypertrophy. Left ventricular diastolic parameters are indeterminate. Right Ventricle: The right ventricular size is normal. No increase in right ventricular wall  thickness. Right ventricular systolic function is normal. There is normal pulmonary artery systolic pressure. The tricuspid regurgitant velocity is 2.22 m/s, and  with an assumed right atrial pressure of 3 mmHg, the estimated right ventricular systolic pressure is 22.7 mmHg. Left Atrium: Left atrial size was normal in size. Right Atrium: Right atrial size was normal in size. Pericardium: There is no evidence of pericardial effusion. Mitral Valve: The mitral valve is normal in structure. Mild mitral valve regurgitation. No evidence of mitral valve stenosis. MV peak gradient, 10.1 mmHg. The mean mitral valve gradient is 5.0 mmHg. Tricuspid Valve: The tricuspid valve is normal in structure. Tricuspid valve regurgitation is not demonstrated. No evidence of tricuspid stenosis. Aortic Valve: The aortic valve is normal in structure. Aortic valve regurgitation is not visualized. No aortic stenosis is present. Aortic valve mean gradient measures 4.0 mmHg. Aortic valve peak gradient measures 7.5 mmHg. Aortic valve area, by VTI measures 2.54 cm. Pulmonic Valve: The pulmonic valve was normal in structure. Pulmonic valve regurgitation is not visualized. No evidence of pulmonic stenosis. Aorta: The aortic root is normal in size and structure. Venous: The inferior vena cava is normal in size with greater than 50% respiratory variability, suggesting right atrial pressure of 3 mmHg. IAS/Shunts: No atrial level shunt detected by color flow Doppler.  LEFT VENTRICLE PLAX 2D LVIDd:         4.30 cm LVIDs:         2.80 cm LV PW:         1.00 cm LV IVS:        0.90 cm LVOT diam:     1.80 cm LV SV:         51 LV SV Index:   26 LVOT Area:     2.54 cm  LV Volumes (MOD) LV vol d, MOD A2C: 87.6 ml LV vol d, MOD A4C: 102.0 ml LV vol s, MOD A2C: 20.0 ml LV vol s, MOD A4C: 38.8 ml LV SV MOD A2C:     67.6 ml LV SV MOD A4C:     102.0 ml LV SV MOD BP:      64.4 ml RIGHT VENTRICLE             IVC RV Basal diam:  3.00 cm     IVC diam: 1.30 cm RV Mid  diam:    2.20 cm RV S prime:     13.30 cm/s TAPSE (M-mode): 2.0 cm LEFT ATRIUM             Index        RIGHT ATRIUM          Index LA diam:        3.30 cm 1.65 cm/m   RA Area:     7.94 cm LA Vol (A2C):  26.5 ml 13.27 ml/m  RA Volume:   13.30 ml 6.66 ml/m LA Vol (A4C):   47.4 ml 23.73 ml/m LA Biplane Vol: 35.9 ml 17.97 ml/m  AORTIC VALVE                    PULMONIC VALVE AV Area (Vmax):    1.99 cm     PV Vmax:       1.01 m/s AV Area (Vmean):   2.06 cm     PV Peak grad:  4.1 mmHg AV Area (VTI):     2.54 cm AV Vmax:           137.00 cm/s AV Vmean:          87.000 cm/s AV VTI:            0.202 m AV Peak Grad:      7.5 mmHg AV Mean Grad:      4.0 mmHg LVOT Vmax:         107.00 cm/s LVOT Vmean:        70.300 cm/s LVOT VTI:          0.202 m LVOT/AV VTI ratio: 1.00  AORTA Ao Root diam: 3.10 cm Ao Asc diam:  3.40 cm MITRAL VALVE                TRICUSPID VALVE MV Area (PHT): 3.87 cm     TR Peak grad:   19.7 mmHg MV Area VTI:   2.27 cm     TR Vmax:        222.00 cm/s MV Peak grad:  10.1 mmHg MV Mean grad:  5.0 mmHg     SHUNTS MV Vmax:       1.59 m/s     Systemic VTI:  0.20 m MV Vmean:      107.0 cm/s   Systemic Diam: 1.80 cm MV Decel Time: 196 msec MV E velocity: 118.00 cm/s Dorothye Gathers MD Electronically signed by Dorothye Gathers MD Signature Date/Time: 02/28/2024/4:48:19 PM    Final        Bertram Brocks M.D. [facility] 03/01/2024, 2:02 PM  Available via Epic secure chat 7am-7pm After 7 pm, please refer to night coverage provider listed on amion.

## 2024-03-01 NOTE — Progress Notes (Signed)
 Daily Progress Note   Patient Name: Heather [name]       Date: 03/01/2024 DOB: [birth year]  Age: 89 y.o. MRN#: [id number] Attending Physician: Loma Rising, MD Primary Care Physician: Orlena Bitters, MD Admit Date: 02/27/2024 Length of Stay: 3 days  Reason for Consultation/Follow-up: Establishing goals of care  Subjective:   CC: Patient notes she is feeling better today.  Following up regarding complex medical decision making.  Subjective:  Reviewed EMR prior to presenting to bedside.  Patient had been seen by palliative medicine provider on 02/27/2024 prior to transfer to [facility] for further care.  Patient has had issues with delirium agitation during hospitalization.  Patient underwent bronchoscopy on 02/29/2024 and was found to have endobronchial lesion at the distal end of the bronchus intermedius with polypoid and friable appearance.  Biopsies were taken at that time.  Reviewed PCCM notes.  Also reviewed recent SLP note.  Patient recommended to have dysphagia 3 diet.  Presented to bedside to see patient.  Patient laying comfortably in bed.  Patient's daughter, Heather [name], present at bedside as well.  Introduced myself as a member of the palliative medicine team and my role in patient's medical journey.  Spent time discussing patient's current medical illness.  Patient laying comfortably in bed and easily able to engage in conversation.  The patient can become tangential in thoughts that time, appropriately engaging with questions and interacting.  Patient's daughter, Heather [name], noted that patient's mentation has greatly improved today especially in the afternoon.  Daughter noted that patient had received Seroquel  overnight and patient was drowsy when she came to visit the patient this morning though that has improved this afternoon.  Patient notes that she is continuing to feel better at this time.  Patient and daughter know awaiting pathology results from bronchoscopy.  Spent time reviewing  patient's medications as well.  Daughter noted that patient normally takes gabapentin  to assist with pain and sleep management at night.  Patient also takes Xanax  1 mg twice daily.  Noted patient is receiving Xanax  1 mg twice daily.  Patient is not currently receiving gabapentin .  Noted would inform hospitalist of this to determine appropriateness of restarting home medication.  Daughter also mentioned that patient had previously taken tramadol .  Discussed adverse effects associated with tramadol  and geriatric population and would not recommend.  With permission, able to spend time with life review for patient.  Spent time learning about patient's quality of life.  Patient wanting to remain as active as able for as long as possible.  Patient enjoys being able to interact with her family.  Patient enjoys being able to eat sweet foods.  Acknowledged this and encouraged continuation of interventions that supported patient's quality of life.  Along this line with permission, able to discuss CODE STATUS.  Explained full code versus DNR/DNI.  Patient described that she actually witnessed her and go through cardiac resuscitation and it was incredibly traumatic for her.  Patient states that she would not want to undergo cardiac resuscitation or intubation with mechanical ventilation.  Patient acknowledges that if she is sick enough her heart stops or she stops breathing, that is her time and she would want to be allowed to have a natural death.  Acknowledges this.  Spent time providing emotional support.  Patient and daughter described how patient's husband who passed away years ago was able to die comfortably at home.  Acknowledged the importance of end-of-life care.  After discussion patient and daughter, Heather [name], agreeing with change of  CODE STATUS to DNR/DNI. While changing CODE STATUS, also discussed we will continue appropriate medical interventions at this time.  All questions answered at that time.  Spent time  enjoying stories patient shared.  Patient enjoys sharing stories with others noted that she has always been talkative.  Daughter acknowledged that.  Thanked patient for sharing her story is with me and speaking with me today.  Noted palliative medicine team will continue to follow with patient's medical journey.  Discussed care with IDT including hospitalist and RN to coordinate care.  Review of Systems Difficulty sleeping Objective:   Vital Signs:  BP (!) 145/65 (BP Location: Left Arm)   Pulse 88   Temp 97.9 F (36.6 C) (Oral)   Resp 18   Ht 5\' 7"  (1.702 m)   Wt 88.9 kg   SpO2 93%   BMI 30.70 kg/m   Physical Exam: General: NAD, alert, pleasant, talkative, elderly Cardiovascular: RRR Respiratory: no increased work of breathing noted, not in respiratory distress Neuro: Awake, interactive, appropriately following commands Psych: Pleasantly talkative  Imaging: I personally reviewed recent imaging.   Assessment & Plan:   Assessment:  Patient is a 89 year old female with a past medical history of anxiety, GERD, hyperlipidemia, hypertension, IBS, NASH cirrhosis, arthritis/debility, question of COPD, and concerns for mild dementia who was transferred on 02/27/2024 from ED in [place] for management of generalized weakness and shortness of breath.  During hospitalization patient has received management for acute hypoxic respiratory failure and findings of new lung mass that underwent biopsy by PCCM.  Palliative medicine team consulted to assist with complex medical decision making.  Recommendations/Plan: # Complex medical decision making/goals of care:  - Extensive discussion with patient while daughter, Heather [name], present at bedside as detailed above in HPI.  Patient hoping she continues to feel better daily.  Patient planning to return to her ALF.  With permission we were able to discuss CODE STATUS.  Explained full code versus DNR/DNI.  Patient able to state that she would not want  intervention such as cardiac resuscitation and intubation with mechanical ventilation as she has seen a prior family member go through this and noted how traumatic it was.  If patient were sick enough her heart were to stop if she were to stop breathing, she would want to be allowed to have a natural death.  Have appropriately change CODE STATUS to DNR/DNI.  Also signed gold DNR form and placed on patient's paper chart as well as assisted in having scanned into EMR.  -  Code Status: Limited: Do not attempt resuscitation (DNR) -DNR-LIMITED -Do Not Intubate/DNI   # Symptom management: Patient normally receiving Xanax  and gabapentin  scheduled as an outpatient.  Informed hospitalist of this.  Recommended pharmacy following up on home dosing to continue home medications if appropriate.  # Psychosocial Support:  - Daughter- Heather [name], sons  # Discharge Planning: To Be Determined  Discussed with: Patient, patient's [name], hospitalist, RN  Thank you for allowing the palliative care team to participate in the care Kazue Raetz.  Barnett Libel, DO Palliative Care Provider PMT # [phone]  If patient remains symptomatic despite maximum doses, please call PMT at [phone] between 0700 and 1900. Outside of these hours, please call attending, as PMT does not have night coverage.  Personally spent 65 minutes in patient care including extensive chart review (labs, imaging, progress/consult notes, vital signs), medically appropraite exam, discussed with treatment team, education to patient, family, and staff, documenting clinical information, medication review and management, coordination of  care, and available advanced directive documents.

## 2024-03-01 NOTE — Plan of Care (Signed)

## 2024-03-01 NOTE — Progress Notes (Signed)
 Speech Language Pathology Treatment: Dysphagia  Patient Details Name: Heather [name] MRN: [id number] DOB: 1931/07/06 Today's Date: 03/01/2024 Time: 8413-2440 SLP Time Calculation (min) (ACUTE ONLY): 11 min  Assessment / Plan / Recommendation Clinical Impression  Pt's swallowing function appears to be generally functional on mechanical soft diet with no oral residue and mastication not overly prolonged. There are no overt signs of aspiration on any trials, even as she alternates with thin liquids or uses them as a liquid wash. Pt is primarily impacted by mentation, as she is quite tangential and talkative while boluses are in her mouth. Despite repeated cues and attempts to reduce distractions, she continues to talk with food in her mouth. Recommend leaving her on mechanical soft diet for now with modifications also to environment to help facilitate attention as much as possible. Will f/u briefly but suspect that this may be most appropriate for pt while she is here in an unfamiliar environment.    HPI HPI: Patient is a 89  y.o. female with PMH: dementia, anxiety, GERD, HLD, HTN, HLD, IBS, compression fracture of L1, NASH cirrhosis, arthritis/debility previous history of respiratory failure in 2023, History of ?COPD, previous admission 02/24/23 with COPD exacerbation/PNA. She presented to the [facility] ED on 02/27/24 with generalized weakness, SOB since 5/4. She was hypoxic  at 80% on RA, placed on supplemental oxygen  up to 7L. CT abd/chest showed: "New right middle and lower lobe collapse with associated volume loss and endobronchial opacification. There is an oval hyperdenseendobronchial component within the bronchus intermedius which could reflect an aspirated foreign body or a bronchial artery aneurysm." She was transferred to [facility] for further evaluation.      SLP Plan  Continue with current plan of care      Recommendations for follow up therapy are one component of a multi-disciplinary discharge  planning process, led by the attending physician.  Recommendations may be updated based on patient status, additional functional criteria and insurance authorization.    Recommendations  Diet recommendations: Dysphagia 3 (mechanical soft);Thin liquid Liquids provided via: Cup;Straw Medication Administration: Whole meds with liquid Supervision: Patient able to self feed;Full supervision/cueing for compensatory strategies Compensations: Slow rate;Small sips/bites;Minimize environmental distractions Postural Changes and/or Swallow Maneuvers: Seated upright 90 degrees                  Oral care BID     Dysphagia, unspecified (R13.10)     Continue with current plan of care     Beth Brooke., M.A. CCC-SLP Acute Rehabilitation Services Office: [phone]  Secure chat preferred   03/01/2024, 9:22 AM

## 2024-03-01 NOTE — TOC Progression Note (Signed)
 Transition of Care [facility]) - Progression Note   Patient Details  Name: Heather [name] MRN: [id number] Date of Birth: 27-May-1931  Transition of Care [facility]) CM/SW Contact  Zenon Hilda, LCSW Phone Number: 03/01/2024, 1:47 PM  Clinical Narrative: Patient is expected to possibly be medically ready this weekend. CSW followed up with Mr. Annalee Barren at [facility] and Mr. Annalee Barren confirmed the facility does not accept weekend admissions, so they will plan on a Monday admission. Hospitalist updated. TOC to follow.  Expected Discharge Plan: Assisted Living ([facility]) Barriers to Discharge: Continued Medical Work up  Expected Discharge Plan and Services In-house Referral: Clinical Social Work Living arrangements for the past 2 months: Assisted Living Facility Expected Discharge Date: 02/29/24               DME Arranged: N/A DME Agency: NA  Social Determinants of Health (SDOH) Interventions SDOH Screenings   Food Insecurity: No Food Insecurity (02/27/2024)  Housing: Low Risk  (02/27/2024)  Transportation Needs: No Transportation Needs (02/27/2024)  Utilities: Not At Risk (02/27/2024)  Financial Resource Strain: Low Risk  (02/27/2023)   Received from [facility], [facility]  Social Connections: Moderately Isolated (02/27/2024)  Tobacco Use: Low Risk  (02/29/2024)   Readmission Risk Interventions    04/28/2022   11:17 AM  Readmission Risk Prevention Plan  Transportation Screening Complete  PCP or Specialist Appt within 5-7 Days Complete  Home Care Screening Complete  Medication Review (RN CM) Complete

## 2024-03-02 DIAGNOSIS — K7581 Nonalcoholic steatohepatitis (NASH): Secondary | ICD-10-CM | POA: Diagnosis not present

## 2024-03-02 DIAGNOSIS — J9601 Acute respiratory failure with hypoxia: Secondary | ICD-10-CM | POA: Diagnosis not present

## 2024-03-02 DIAGNOSIS — I10 Essential (primary) hypertension: Secondary | ICD-10-CM | POA: Diagnosis not present

## 2024-03-02 DIAGNOSIS — K7460 Unspecified cirrhosis of liver: Secondary | ICD-10-CM | POA: Diagnosis not present

## 2024-03-02 LAB — ACID FAST SMEAR (AFB, MYCOBACTERIA): Acid Fast Smear: NEGATIVE

## 2024-03-02 MED ORDER — MELATONIN 3 MG PO TABS
3.0000 mg | ORAL_TABLET | ORAL | Status: DC
  Administered 2024-03-02 – 2024-03-03 (×2): 3 mg via ORAL
  Filled 2024-03-02 (×2): qty 1

## 2024-03-02 MED ORDER — HALOPERIDOL LACTATE 5 MG/ML IJ SOLN: 0.5000 mg | Freq: Four times a day (QID) | INTRAMUSCULAR | Status: DC | PRN

## 2024-03-02 MED ORDER — LACTULOSE 10 GM/15ML PO SOLN
20.0000 g | Freq: Every day | ORAL | Status: DC
  Administered 2024-03-02 – 2024-03-04 (×3): 20 g via ORAL
  Filled 2024-03-02 (×3): qty 30

## 2024-03-02 MED ORDER — MELATONIN 3 MG PO TABS: 3.0000 mg | ORAL_TABLET | Freq: Every day | ORAL | Status: DC

## 2024-03-02 NOTE — Progress Notes (Signed)
 [facility]                                                                              Heather [name], is a 89 y.o. female, DOB - 1931/03/18, JYN:[id number] Admit date - 02/27/2024    Outpatient Primary MD for the patient is [name], [name], MD  LOS - 4  days  Chief Complaint  Patient presents with   Shortness of Breath       Brief summary   Patient is a 89 year old female with anxiety, GERD, HLD, HTN, HLD, IBS, compression fracture of L1, NASH cirrhosis, arthritis/debility previous history of respiratory failure in 2023, History of ?COPD on  Breo, previous admission on 02/24/2023 for COPD exacerbation/pneumonia  Presented to the ED from [facility] with generalized weakness, shortness of breath since last Sunday.On presentation hypoxia 80% on RA-placed on supplemental oxygen  up to 7 L, In the ED patient felt to be poor historian, tachypneic up to 30 BP stable.  Labs showed ABG PCO238 PO2.4PO 266, mild hyponatremia ammonia 37 BNP 179 leukocytosis 19.7. CXR> mild congestive changes with small bilateral pleural effusions.Heather [name]   COVID RSV flu negative Patient was given Solu-Medrol  125, DuoNeb IV Lasix  and admission was requested. D-dimer elevated, CTA chest showed no evidence of acute PE, new right middle and lower lobe collapse with associated volume loss and endobronchial opacification.  There is an oval hyperdense endobronchial component with an the bronchus intermedius which could reflect an aspirated foreign body or bronchial artery aneurysm. PCCM consulted  Assessment & Plan    Principal Problem:   Acute hypoxic respiratory failure (HCC) COPD with acute exacerbation, right middle lobe and lower lobe collapse -Presented with wheezing, shortness of breath, hypoxia - Chest x-ray with congestive changes with right lower lobe hypoventilatory atelectasis - D-dimer elevated, CTA showed new right middle and lower lobe collapse with associated volume loss and  endobronchial opacification -  SLP eval 5/7  dysphagia 3 diet, thin liquids - 2D echo showed EF of 65 to 70%, no regional WMA - Underwent bronchoscopy on 5/7 with endobronchial lesion, status post biopsy and BAL.   -Biopsy results pending - Follow cultures, rare gram-negative rods, continue IV Rocephin   Active Problems: Mild acute diastolic CHF - 2D echo showed EF of 65 to 70%, indeterminate diastolic parameters - Chest x-ray on admission had shown small pleural effusion, received IV Lasix   Delirium, sundowning superimposed on dementia -  Patient has been delirious, with sundowning and agitation - Looks like family declined Seroquel  last night, then overnight she received IV Haldol  1 mg at 2032.   -Ammonia level 19 on 5/9 - Gabapentin  was added per her home dose  - This morning still confused but not agitated.  Continue lactulose  daily, obtain CBC, c-Met, ammonia level in a.m.  Right leg pain -Venous Dopplers showed no evidence of DVT in either extremity  Leukocytosis - Improving, continue IV Rocephin    GERD - Continue PPI    Liver cirrhosis secondary to NASH (HCC) - Ammonia level improved after MiraLAX     Essential hypertension BP stable    Obesity class I Estimated body mass index is 30.7 kg/m  as calculated from the following:   Height as of this encounter: 5\' 7"  (1.702 m).   Weight as of this encounter: 88.9 kg.  Code Status: Full code DVT Prophylaxis:     Level of Care: Level of care: Progressive Family Communication: Updated patient Disposition Plan:      Remains inpatient appropriate: Hopefully DC back to ALF on Monday   Procedures:    Consultants:   PCCM Palliative Antimicrobials:   Anti-infectives (From admission, onward)    Start     Dose/Rate Route Frequency Ordered Stop   02/29/24 1600  fluconazole  (DIFLUCAN ) tablet 100 mg        100 mg Oral Daily 02/29/24 1459 03/07/24 0959   02/29/24 1415  fluconazole  (DIFLUCAN ) IVPB 100 mg  Status:   Discontinued        100 mg 50 mL/hr over 60 Minutes Intravenous Every 24 hours 02/29/24 1401 02/29/24 1458   02/27/24 1600  cefTRIAXone  (ROCEPHIN ) 1 g in sodium chloride  0.9 % 100 mL IVPB        1 g 200 mL/hr over 30 Minutes Intravenous Every 24 hours 02/27/24 1430            Medications  albuterol   5 mg/hr Nebulization Once   ALPRAZolam   1 mg Oral BID   arformoterol   15 mcg Nebulization BID   aspirin   81 mg Oral Q breakfast   budesonide  (PULMICORT ) nebulizer solution  0.25 mg Nebulization BID   Chlorhexidine  Gluconate Cloth  6 each Topical Q0600   ferrous sulfate   325 mg Oral Q breakfast   fluconazole   100 mg Oral Daily   furosemide   20 mg Intravenous Daily   gabapentin   300 mg Oral BID   isosorbide  mononitrate  60 mg Oral Daily   metoprolol  tartrate  25 mg Oral BID   mupirocin  ointment   Nasal BID   pantoprazole   40 mg Oral Daily   predniSONE   40 mg Oral QAC breakfast   QUEtiapine   12.5 mg Oral QHS   revefenacin   175 mcg Nebulization Daily      Subjective:   Heather [name] was seen and examined today.  Not agitated, overnight had received IV Haldol  1 mg.  Feeling cold.  No acute agitation, chest pain, shortness of breath, fevers or chills.   Objective:   Vitals:   03/02/24 0619 03/02/24 0623 03/02/24 0636 03/02/24 0900  BP:   (!) 152/54 (!) 164/72  Pulse:   69 74  Resp:   16 16  Temp:   (!) 97.5 F (36.4 C) 98.2 F (36.8 C)  TempSrc:   Oral Oral  SpO2: 91% 91% 100% 90%  Weight:      Height:        Intake/Output Summary (Last 24 hours) at 03/02/2024 1159 Last data filed at 03/02/2024 1119 Gross per 24 hour  Intake --  Output 2000 ml  Net -2000 ml     Wt Readings from Last 3 Encounters:  03/01/24 88.9 kg  04/09/23 78.9 kg  04/25/22 77.9 kg    Physical Exam General: Alert, oriented x to self and place.  Knows she is at the hospital. Cardiovascular: S1 S2 clear, RRR.  Respiratory: CTAB, no wheezing, Gastrointestinal: Soft, nontender,  nondistended, NBS Ext: no pedal edema bilaterally Neuro: no new deficits Psych: confused     Data Reviewed:  I have personally reviewed following labs    CBC Lab Results  Component Value Date   WBC 11.2 (H) 03/01/2024   RBC 4.23 03/01/2024  HGB 13.2 03/01/2024   HCT 39.2 03/01/2024   MCV 92.7 03/01/2024   MCH 31.2 03/01/2024   PLT 173 03/01/2024   MCHC 33.7 03/01/2024   RDW 13.1 03/01/2024   LYMPHSABS 2.7 04/09/2023   MONOABS 0.8 04/09/2023   EOSABS 0.3 04/09/2023   BASOSABS 0.1 04/09/2023     Last metabolic panel Lab Results  Component Value Date   NA 138 03/01/2024   K 3.9 03/01/2024   CL 105 03/01/2024   CO2 22 03/01/2024   BUN 38 (H) 03/01/2024   CREATININE 0.84 03/01/2024   GLUCOSE 156 (H) 03/01/2024   GFRNONAA >60 03/01/2024   GFRAA >60 03/18/2019   CALCIUM  8.6 (L) 03/01/2024   PHOS 3.1 03/01/2024   PROT 6.6 03/01/2024   ALBUMIN 3.0 (L) 03/01/2024   BILITOT 0.6 03/01/2024   ALKPHOS 44 03/01/2024   AST 41 03/01/2024   ALT 38 03/01/2024   ANIONGAP 11 03/01/2024    CBG (last 3)  No results for input(s): "GLUCAP" in the last 72 hours.    Coagulation Profile: No results for input(s): "INR", "PROTIME" in the last 168 hours.   Radiology Studies: I have personally reviewed the imaging studies  DG Chest Port 1 View Result Date: 02/29/2024 CLINICAL DATA:  Status post bronchoscopy. EXAM: PORTABLE CHEST 1 VIEW COMPARISON:  Chest radiograph dated 02/28/2024. FINDINGS: Shallow inspiration with bibasilar atelectasis. No focal consolidation, pleural effusion or pneumothorax. The cardiac silhouette. Atherosclerotic calcification of the aorta. Osteopenia with degenerative changes of the spine. No acute osseous pathology. IMPRESSION: Shallow inspiration with bibasilar atelectasis. No pneumothorax. Electronically Signed   By: Angus Bark M.D.   On: 02/29/2024 14:27       Ahmyah Gidley M.D. [facility] 03/02/2024, 11:59 AM  Available via Epic secure  chat 7am-7pm After 7 pm, please refer to night coverage provider listed on amion.

## 2024-03-02 NOTE — Progress Notes (Signed)
  Daily Progress Note   Patient Name: Heather [name]       Date: 03/02/2024 DOB: [birth year]  Age: 89 y.o. MRN#: [id number] Attending Physician: Loma Rising, MD Primary Care Physician: Orlena Bitters, MD Admit Date: 02/27/2024 Length of Stay: 4 days  Reason for Consultation/Follow-up: Establishing goals of care  Subjective:   CC: Patient sleeping very soundly when seen today.  Following up regarding complex medical decision making.  Subjective:  Reviewed EMR prior to presenting to bedside.  At time of EMR review in past 24 hours patient has received as needed IV Haldol  1 mg x 1 dose for agitation.  Patient also received as needed melatonin.  Hospitalist was able to reinitiate patient's home medicine of gabapentin .  Presented to bedside to see patient.  No family present at bedside.  Patient sleeping very soundly when seen so did not awaken.  Reviewed recent hospitalist note for medical updates.  Awaiting pathology results.  Objective:   Vital Signs:  BP (!) 164/72 (BP Location: Right Arm)   Pulse 74   Temp 98.2 F (36.8 C) (Oral)   Resp 16   Ht 5\' 7"  (1.702 m)   Wt 88.9 kg   SpO2 90%   BMI 30.70 kg/m   Physical Exam: General: Sleeping soundly, elderly Cardiovascular: RRR  Imaging: I personally reviewed recent imaging.   Assessment & Plan:   Assessment:  Patient is a 89 year old female with a past medical history of anxiety, GERD, hyperlipidemia, hypertension, IBS, NASH cirrhosis, arthritis/debility, question of COPD, and concerns for mild dementia who was transferred on 02/27/2024 from ED in [place] for management of generalized weakness and shortness of breath.  During hospitalization patient has received management for acute hypoxic respiratory failure and findings of new lung mass that underwent biopsy by PCCM.  Palliative medicine team consulted to assist with complex medical decision making.  Recommendations/Plan: # Complex medical decision making/goals of  care:  - Patient sleeping soundly today so did not awaken for further discussions.  Had discussed care with patient while daughter, Jacquelene Mathieu, present on 03/01/2024.  Patient planning to return to her ALF.  Discussed CODE STATUS at that time and was changed to DNR/DNI as per patient's wishes.  Already placed signed gold DNR form on patient's chart.  Awaiting pathology results from biopsy.  Palliative medicine team continuing to follow along to engage in conversations as able and appropriate.  -  Code Status: Limited: Do not attempt resuscitation (DNR) -DNR-LIMITED -Do Not Intubate/DNI   # Symptom management: -Insomnia  -Changed as needed melatonin to scheduled melatonin nightly  # Psychosocial Support:  - Daughter- Jacquelene Mathieu, sons  # Discharge Planning: To Be Determined  Discussed with: Patient, patient's [name], hospitalist, RN  Thank you for allowing the palliative care team to participate in the care Luz Routh.  Barnett Libel, DO Palliative Care Provider PMT # [phone]  If patient remains symptomatic despite maximum doses, please call PMT at [phone] between 0700 and 1900. Outside of these hours, please call attending, as PMT does not have night coverage.

## 2024-03-03 DIAGNOSIS — K2100 Gastro-esophageal reflux disease with esophagitis, without bleeding: Secondary | ICD-10-CM | POA: Diagnosis not present

## 2024-03-03 DIAGNOSIS — J9601 Acute respiratory failure with hypoxia: Secondary | ICD-10-CM | POA: Diagnosis not present

## 2024-03-03 DIAGNOSIS — I10 Essential (primary) hypertension: Secondary | ICD-10-CM | POA: Diagnosis not present

## 2024-03-03 DIAGNOSIS — K7581 Nonalcoholic steatohepatitis (NASH): Secondary | ICD-10-CM | POA: Diagnosis not present

## 2024-03-03 LAB — CBC
HCT: 44.6 % (ref 36.0–46.0)
Hemoglobin: 14.7 g/dL (ref 12.0–15.0)
MCH: 30.9 pg (ref 26.0–34.0)
MCHC: 33 g/dL (ref 30.0–36.0)
MCV: 93.7 fL (ref 80.0–100.0)
Platelets: 219 10*3/uL (ref 150–400)
RBC: 4.76 MIL/uL (ref 3.87–5.11)
RDW: 12.9 % (ref 11.5–15.5)
WBC: 11.6 10*3/uL — ABNORMAL HIGH (ref 4.0–10.5)
nRBC: 0 % (ref 0.0–0.2)

## 2024-03-03 LAB — CULTURE, RESPIRATORY W GRAM STAIN
Culture: NORMAL
Gram Stain: NONE SEEN

## 2024-03-03 LAB — AMMONIA: Ammonia: <13 umol/L (ref 9–35)

## 2024-03-03 LAB — COMPREHENSIVE METABOLIC PANEL WITH GFR
ALT: 72 U/L — ABNORMAL HIGH (ref 0–44)
AST: 59 U/L — ABNORMAL HIGH (ref 15–41)
Albumin: 3.1 g/dL — ABNORMAL LOW (ref 3.5–5.0)
Alkaline Phosphatase: 47 U/L (ref 38–126)
Anion gap: 9 (ref 5–15)
BUN: 33 mg/dL — ABNORMAL HIGH (ref 8–23)
CO2: 28 mmol/L (ref 22–32)
Calcium: 9 mg/dL (ref 8.9–10.3)
Chloride: 105 mmol/L (ref 98–111)
Creatinine, Ser: 0.75 mg/dL (ref 0.44–1.00)
GFR, Estimated: >60 mL/min (ref >60)
Glucose, Bld: 100 mg/dL — ABNORMAL HIGH (ref 70–99)
Potassium: 3.9 mmol/L (ref 3.5–5.1)
Sodium: 142 mmol/L (ref 135–145)
Total Bilirubin: 0.6 mg/dL (ref 0.0–1.2)
Total Protein: 6.6 g/dL (ref 6.5–8.1)

## 2024-03-03 LAB — TROPONIN I (HIGH SENSITIVITY): Troponin I (High Sensitivity): 11 ng/L (ref <18)

## 2024-03-03 MED ORDER — ALUM & MAG HYDROXIDE-SIMETH 200-200-20 MG/5ML PO SUSP: 15.0000 mL | Freq: Four times a day (QID) | ORAL | Status: DC | PRN

## 2024-03-03 MED ORDER — PANTOPRAZOLE SODIUM 40 MG PO TBEC
40.0000 mg | DELAYED_RELEASE_TABLET | Freq: Two times a day (BID) | ORAL | Status: DC
  Administered 2024-03-03 – 2024-03-04 (×2): 40 mg via ORAL
  Filled 2024-03-03 (×2): qty 1

## 2024-03-03 NOTE — Progress Notes (Signed)
 [facility]                                                                              Heather [name], is a 89 y.o. female, DOB - September 23, 1931, ZOX:[id number] Admit date - 02/27/2024    Outpatient Primary MD for the patient is [name], [name], MD  LOS - 5  days  Chief Complaint  Patient presents with   Shortness of Breath       Brief summary   Patient is a 89 year old female with anxiety, GERD, HLD, HTN, HLD, IBS, compression fracture of L1, NASH cirrhosis, arthritis/debility previous history of respiratory failure in 2023, History of ?COPD on  Breo, previous admission on 02/24/2023 for COPD exacerbation/pneumonia  Presented to the ED from [place] [facility] with generalized weakness, shortness of breath since last Sunday.On presentation hypoxia 80% on RA-placed on supplemental oxygen  up to 7 L, In the ED patient felt to be poor historian, tachypneic up to 30 BP stable.  Labs showed ABG PCO238 PO2.4PO 266, mild hyponatremia ammonia 37 BNP 179 leukocytosis 19.7. CXR> mild congestive changes with small bilateral pleural effusions.Aaron Aas   COVID RSV flu negative Patient was given Solu-Medrol  125, DuoNeb IV Lasix  and admission was requested. D-dimer elevated, CTA chest showed no evidence of acute PE, new right middle and lower lobe collapse with associated volume loss and endobronchial opacification.  There is an oval hyperdense endobronchial component with an the bronchus intermedius which could reflect an aspirated foreign body or bronchial artery aneurysm. PCCM consulted  Assessment & Plan    Principal Problem:   Acute hypoxic respiratory failure (HCC) COPD with acute exacerbation, right middle lobe and lower lobe collapse -Presented with wheezing, shortness of breath, hypoxia - Chest x-ray with congestive changes with right lower lobe hypoventilatory atelectasis - D-dimer elevated, CTA showed new right middle and lower lobe collapse with associated volume loss and  endobronchial opacification -  SLP eval 5/7  dysphagia 3 diet, thin liquids - 2D echo showed EF of 65 to 70%, no regional WMA - Underwent bronchoscopy on 5/7 with endobronchial lesion, status post biopsy and BAL.   - Biopsy results still pending  - BAL cultures showed rare gram-negative rods, continue IV Rocephin    Active Problems: Mild acute diastolic CHF - 2D echo showed EF of 65 to 70%, indeterminate diastolic parameters - Chest x-ray on admission had shown small pleural effusion, received IV Lasix   Delirium, sundowning superimposed on dementia -  Patient has been delirious, with sundowning and agitation - Improving, overnight did not need any Haldol   - Ammonia level normal, continue gabapentin  300 mg twice daily l - Gabapentin  was added per her home dose  - Holding Seroquel  today   Right leg pain -Venous Dopplers showed no evidence of DVT in either extremity  Leukocytosis - Improving, continue IV Rocephin    GERD - Continue PPI    Liver cirrhosis secondary to NASH (HCC) - Ammonia level improved after MiraLAX     Essential hypertension BP stable  Atypical chest pain resolved - EKG showed no acute ST-T wave changes for ischemia, follow troponin, - May have esophagitis/gastritis due to prednisone , continue PPI twice  daily, added Maalox Follow troponin x 1  Obesity class I Estimated body mass index is 29.14 kg/m as calculated from the following:   Height as of this encounter: 5\' 7"  (1.702 m).   Weight as of this encounter: 84.4 kg.  Code Status: Full code DVT Prophylaxis:     Level of Care: Level of care: Progressive Family Communication: Updated patient Disposition Plan:      Remains inpatient appropriate: Hopefully DC back to ALF on Monday   Procedures:    Consultants:   PCCM Palliative Antimicrobials:   Anti-infectives (From admission, onward)    Start     Dose/Rate Route Frequency Ordered Stop   02/29/24 1600  fluconazole  (DIFLUCAN ) tablet 100 mg         100 mg Oral Daily 02/29/24 1459 03/07/24 0959   02/29/24 1415  fluconazole  (DIFLUCAN ) IVPB 100 mg  Status:  Discontinued        100 mg 50 mL/hr over 60 Minutes Intravenous Every 24 hours 02/29/24 1401 02/29/24 1458   02/27/24 1600  cefTRIAXone  (ROCEPHIN ) 1 g in sodium chloride  0.9 % 100 mL IVPB        1 g 200 mL/hr over 30 Minutes Intravenous Every 24 hours 02/27/24 1430            Medications  albuterol   5 mg/hr Nebulization Once   ALPRAZolam   1 mg Oral BID   arformoterol   15 mcg Nebulization BID   aspirin   81 mg Oral Q breakfast   budesonide  (PULMICORT ) nebulizer solution  0.25 mg Nebulization BID   Chlorhexidine  Gluconate Cloth  6 each Topical Q0600   ferrous sulfate   325 mg Oral Q breakfast   fluconazole   100 mg Oral Daily   furosemide   20 mg Intravenous Daily   gabapentin   300 mg Oral BID   isosorbide  mononitrate  60 mg Oral Daily   lactulose   20 g Oral Daily   melatonin  3 mg Oral Q24H   metoprolol  tartrate  25 mg Oral BID   mupirocin  ointment   Nasal BID   pantoprazole   40 mg Oral BID AC   predniSONE   40 mg Oral QAC breakfast   revefenacin   175 mcg Nebulization Daily      Subjective:   Heather [name] was seen and examined today.  Much more alert and oriented, no agitation.  Had atypical chest pain today.  No nausea vomiting, abdominal pain  Objective:   Vitals:   03/03/24 0511 03/03/24 1040 03/03/24 1045 03/03/24 1103  BP: (!) 175/70   (!) 120/56  Pulse: 66   (!) 58  Resp: 20   18  Temp: 97.7 F (36.5 C)   97.7 F (36.5 C)  TempSrc: Oral   Oral  SpO2: 95% 93% 93% 94%  Weight: 84.4 kg     Height:        Intake/Output Summary (Last 24 hours) at 03/03/2024 1202 Last data filed at 03/03/2024 1145 Gross per 24 hour  Intake 240 ml  Output 1300 ml  Net -1060 ml     Wt Readings from Last 3 Encounters:  03/03/24 84.4 kg  04/09/23 78.9 kg  04/25/22 77.9 kg   Physical Exam General: Alert and oriented x self and place, appropriately answering  questions. Cardiovascular: S1 S2 clear, RRR.  Respiratory: CTAB Gastrointestinal: Soft, nontender, nondistended, NBS Ext: no pedal edema bilaterally Neuro: no new deficits Psych: Normal affect     Data Reviewed:  I have personally reviewed following labs    CBC  Lab Results  Component Value Date   WBC 11.6 (H) 03/03/2024   RBC 4.76 03/03/2024   HGB 14.7 03/03/2024   HCT 44.6 03/03/2024   MCV 93.7 03/03/2024   MCH 30.9 03/03/2024   PLT 219 03/03/2024   MCHC 33.0 03/03/2024   RDW 12.9 03/03/2024   LYMPHSABS 2.7 04/09/2023   MONOABS 0.8 04/09/2023   EOSABS 0.3 04/09/2023   BASOSABS 0.1 04/09/2023     Last metabolic panel Lab Results  Component Value Date   NA 142 03/03/2024   K 3.9 03/03/2024   CL 105 03/03/2024   CO2 28 03/03/2024   BUN 33 (H) 03/03/2024   CREATININE 0.75 03/03/2024   GLUCOSE 100 (H) 03/03/2024   GFRNONAA >60 03/03/2024   GFRAA >60 03/18/2019   CALCIUM  9.0 03/03/2024   PHOS 3.1 03/01/2024   PROT 6.6 03/03/2024   ALBUMIN 3.1 (L) 03/03/2024   BILITOT 0.6 03/03/2024   ALKPHOS 47 03/03/2024   AST 59 (H) 03/03/2024   ALT 72 (H) 03/03/2024   ANIONGAP 9 03/03/2024    CBG (last 3)  No results for input(s): "GLUCAP" in the last 72 hours.    Coagulation Profile: No results for input(s): "INR", "PROTIME" in the last 168 hours.   Radiology Studies: I have personally reviewed the imaging studies  No results found.      Bertram Brocks M.D. [facility] 03/03/2024, 12:02 PM  Available via Epic secure chat 7am-7pm After 7 pm, please refer to night coverage provider listed on amion.

## 2024-03-03 NOTE — Progress Notes (Signed)
 Patient c/o new chest pain at beginning of breathing treatments (around 1100). Pain in center of sternum, rated 6/10. Denied SOB or nausea. EKG done- results in chart. VSS, O2 94% RA. Patient stated pain was gone before EKG was even completed. MD Rai made aware. Will continue to monitor.

## 2024-03-04 DIAGNOSIS — I10 Essential (primary) hypertension: Secondary | ICD-10-CM | POA: Diagnosis not present

## 2024-03-04 DIAGNOSIS — J9811 Atelectasis: Secondary | ICD-10-CM

## 2024-03-04 DIAGNOSIS — J9601 Acute respiratory failure with hypoxia: Secondary | ICD-10-CM | POA: Diagnosis not present

## 2024-03-04 DIAGNOSIS — K7581 Nonalcoholic steatohepatitis (NASH): Secondary | ICD-10-CM | POA: Diagnosis not present

## 2024-03-04 LAB — COMPREHENSIVE METABOLIC PANEL WITH GFR
ALT: 82 U/L — ABNORMAL HIGH (ref 0–44)
AST: 57 U/L — ABNORMAL HIGH (ref 15–41)
Albumin: 3 g/dL — ABNORMAL LOW (ref 3.5–5.0)
Alkaline Phosphatase: 47 U/L (ref 38–126)
Anion gap: 8 (ref 5–15)
BUN: 33 mg/dL — ABNORMAL HIGH (ref 8–23)
CO2: 27 mmol/L (ref 22–32)
Calcium: 9 mg/dL (ref 8.9–10.3)
Chloride: 102 mmol/L (ref 98–111)
Creatinine, Ser: 0.77 mg/dL (ref 0.44–1.00)
GFR, Estimated: >60 mL/min (ref >60)
Glucose, Bld: 109 mg/dL — ABNORMAL HIGH (ref 70–99)
Potassium: 3.8 mmol/L (ref 3.5–5.1)
Sodium: 137 mmol/L (ref 135–145)
Total Bilirubin: 0.5 mg/dL (ref 0.0–1.2)
Total Protein: 6.6 g/dL (ref 6.5–8.1)

## 2024-03-04 LAB — CBC
HCT: 43.8 % (ref 36.0–46.0)
Hemoglobin: 14.4 g/dL (ref 12.0–15.0)
MCH: 31 pg (ref 26.0–34.0)
MCHC: 32.9 g/dL (ref 30.0–36.0)
MCV: 94.2 fL (ref 80.0–100.0)
Platelets: 240 10*3/uL (ref 150–400)
RBC: 4.65 MIL/uL (ref 3.87–5.11)
RDW: 12.8 % (ref 11.5–15.5)
WBC: 10.8 10*3/uL — ABNORMAL HIGH (ref 4.0–10.5)
nRBC: 0 % (ref 0.0–0.2)

## 2024-03-04 LAB — SURGICAL PATHOLOGY

## 2024-03-04 LAB — CYTOLOGY - NON PAP

## 2024-03-04 MED ORDER — ALBUTEROL SULFATE HFA 108 (90 BASE) MCG/ACT IN AERS: 2.0000 | INHALATION_SPRAY | Freq: Four times a day (QID) | RESPIRATORY_TRACT | 2 refills | Status: AC | PRN

## 2024-03-04 MED ORDER — FLUCONAZOLE 100 MG PO TABS: 100.0000 mg | ORAL_TABLET | Freq: Every day | ORAL | 0 refills | Status: AC

## 2024-03-04 MED ORDER — CEFPODOXIME PROXETIL 100 MG/5ML PO SUSR
200.0000 mg | Freq: Two times a day (BID) | ORAL | Status: DC
Start: 2024-03-04 — End: 2024-03-04

## 2024-03-04 MED ORDER — SMOG ENEMA
960.0000 mL | Freq: Once | RECTAL | Status: AC
  Administered 2024-03-04: 960 mL via RECTAL
  Filled 2024-03-04: qty 960

## 2024-03-04 MED ORDER — FLUTICASONE FUROATE-VILANTEROL 200-25 MCG/ACT IN AEPB: 1.0000 | INHALATION_SPRAY | Freq: Every day | RESPIRATORY_TRACT | 3 refills | Status: AC

## 2024-03-04 MED ORDER — GABAPENTIN 300 MG PO CAPS
300.0000 mg | ORAL_CAPSULE | Freq: Two times a day (BID) | ORAL | 0 refills | Status: AC
Start: 2024-03-04 — End: ?

## 2024-03-04 MED ORDER — CEFUROXIME AXETIL 500 MG PO TABS
500.0000 mg | ORAL_TABLET | Freq: Two times a day (BID) | ORAL | Status: DC
  Filled 2024-03-04: qty 1

## 2024-03-04 MED ORDER — PREDNISONE 20 MG PO TABS: 40.0000 mg | ORAL_TABLET | Freq: Every day | ORAL | 0 refills | Status: AC

## 2024-03-04 MED ORDER — CEFPODOXIME PROXETIL 200 MG PO TABS: 200.0000 mg | ORAL_TABLET | Freq: Two times a day (BID) | ORAL | 0 refills | Status: AC

## 2024-03-04 NOTE — TOC Transition Note (Signed)
 Transition of Care [facility]) - Discharge Note  Patient Details  Name: Heather [name] MRN: [id number] Date of Birth: 1931/02/23  Transition of Care [facility]) CM/SW Contact:  Zenon Hilda, LCSW Phone Number: 03/04/2024, 2:24 PM  Clinical Narrative: Patient is medically ready to return to [facility]. CSW spoke with Mr. Annalee Barren and he has completed patient's in-person assessment and patient can return today. FL2 completed. Discharge summary, FL2, and HH orders emailed to [email or web address]. CSW provided discharge packet to patient's daughter, Jacquelene Mathieu Roche, as she will transport the patient back to her ALF. TOC signing off.  Final next level of care: Assisted Living Barriers to Discharge: Barriers Resolved  Patient Goals and CMS Choice Patient states their goals for this hospitalization and ongoing recovery are:: Patient oriented to self only Choice offered to / list presented to : NA  Discharge Placement      Patient chooses bed at: Other - please specify in the comment section below: ([facility]) Patient to be transferred to facility by: Daughter Name of family member notified: [name] Roche (daughter) Patient and family notified of of transfer: 03/04/24  Discharge Plan and Services Additional resources added to the After Visit Summary for   In-house Referral: Clinical Social Work        DME Arranged: N/A DME Agency: NA  Social Drivers of Health (SDOH) Interventions SDOH Screenings   Food Insecurity: No Food Insecurity (02/27/2024)  Housing: Low Risk  (02/27/2024)  Transportation Needs: No Transportation Needs (02/27/2024)  Utilities: Not At Risk (02/27/2024)  Financial Resource Strain: Low Risk  (02/27/2023)   Received from [facility], [facility]  Social Connections: Moderately Isolated (02/27/2024)  Tobacco Use: Low Risk  (02/29/2024)   Readmission Risk Interventions    04/28/2022   11:17 AM  Readmission Risk Prevention Plan  Transportation Screening  Complete  PCP or Specialist Appt within 5-7 Days Complete  Home Care Screening Complete  Medication Review (RN CM) Complete

## 2024-03-04 NOTE — Progress Notes (Signed)
 Patient D/C with daughter in daughter's vehicle. X2 assist by RN and NT to get into vehicle- pt able to stand and pivot. All belongings and paperwork sent with patient. Report called to [facility] at [employer], all questions answered.

## 2024-03-04 NOTE — Evaluation (Signed)
 Occupational Therapy Evaluation Patient Details Name: Heather [name] MRN: [id number] DOB: 12-Apr-1931 Today's Date: 03/04/2024   History of Present Illness   Heather [name] is a 89 yo female admitted to [facility] from [facility] with COPD exacerbation, R middle and LL lung collapse  PMH: 89 year old female with anxiety, GERD, HLD, HTN, HLD, IBS, compression fracture of L1, NASH cirrhosis, arthritis/debility previous history of respiratory failure in 2023, COPD on  Breo, previous admission on 02/24/2023 for COPD exacerbation/pneumonia     Clinical Impressions PTA, patient lives in ALF and receives assistance for w/c level mobility and ADL's and IADL's with confirmation with OT clinician speaking with patient's daughter this am as patient is a limited historian. Patient presents with deficits outlined below (see OT Problem List for details) most significantly LB weakness, balance, activity tolerance and cognitive deficits impacting ADL's and functional mobility. Anticipate patient will be able to return to ALF with [facility] services and continue with Acute level OT to progress functional level.      If plan is discharge home, recommend the following:   Two people to help with walking and/or transfers;A lot of help with bathing/dressing/bathroom;Assistance with cooking/housework;Assistance with feeding;Direct supervision/assist for medications management;Direct supervision/assist for financial management;Assist for transportation;Help with stairs or ramp for entrance;Supervision due to cognitive status     Functional Status Assessment   Patient has had a recent decline in their functional status and demonstrates the ability to make significant improvements in function in a reasonable and predictable amount of time.     Equipment Recommendations   None recommended by OT      Precautions/Restrictions   Precautions Precautions: Fall Recall of Precautions/Restrictions:  Impaired Restrictions Weight Bearing Restrictions Per Provider Order: No     Mobility Bed Mobility Overal bed mobility: Needs Assistance Bed Mobility: Rolling, Supine to Sit Rolling: Supervision, Used rails   Supine to sit: Contact guard, HOB elevated, Used rails          Transfers Overall transfer level: Needs assistance Equipment used: None Transfers: Sit to/from Stand, Bed to chair/wheelchair/BSC Sit to Stand: Min assist          Lateral/Scoot Transfers: Min assist General transfer comment: lateral scoot bed to commode (uses w/c in ALF)      Balance Overall balance assessment: Needs assistance Sitting-balance support: Feet supported Sitting balance-Leahy Scale: Fair     Standing balance support: Bilateral upper extremity supported Standing balance-Leahy Scale: Poor                             ADL either performed or assessed with clinical judgement   ADL Overall ADL's : Needs assistance/impaired Eating/Feeding: Set up;Sitting   Grooming: Set up;Cueing for sequencing;Sitting   Upper Body Bathing: Set up;Sitting;Cueing for sequencing   Lower Body Bathing: Moderate assistance;Sitting/lateral leans;Bed level   Upper Body Dressing : Contact guard assist;Sitting;Cueing for sequencing   Lower Body Dressing: Moderate assistance;Cueing for sequencing;Sitting/lateral leans;Sit to/from stand;Bed level   Toilet Transfer: BSC/3in1;Minimal assistance;Squat-pivot   Toileting- Clothing Manipulation and Hygiene: Minimal assistance;Sitting/lateral lean       Functional mobility during ADLs: Minimal assistance;Cueing for safety;Cueing for sequencing (lateral squat pivot to commode from bed) General ADL Comments: weight shifts for peri hygiene     Vision Baseline Vision/History: 1 Wears glasses Ability to See in Adequate Light: 1 Impaired Patient Visual Report: Blurring of vision Vision Assessment?: Wears glasses for reading Additional Comments:  reports baseline blurriness with glasses  Perception Perception: Impaired Preception Impairment Details: Spatial orientation     Praxis Praxis: Impaired Praxis Impairment Details: Organization     Pertinent Vitals/Pain Pain Assessment Pain Assessment: Faces Faces Pain Scale: No hurt     Extremity/Trunk Assessment Upper Extremity Assessment Upper Extremity Assessment: Right hand dominant;Overall [facility] for tasks assessed   Lower Extremity Assessment Lower Extremity Assessment: Generalized weakness   Cervical / Trunk Assessment Cervical / Trunk Assessment: Kyphotic   Communication Communication Communication: No apparent difficulties   Cognition Arousal: Alert Behavior During Therapy: WFL for tasks assessed/performed Cognition: Cognition impaired   Orientation impairments: Time Awareness: Intellectual awareness impaired, Online awareness impaired Memory impairment (select all impairments): Short-term memory Attention impairment (select first level of impairment): Sustained attention Executive functioning impairment (select all impairments): Sequencing, Reasoning, Problem solving, Organization OT - Cognition Comments: focussed on initiating bowel routine thus distracted, daughter Heather [name] provided some history re: PLOF to ensure accuracy                 Following commands: Impaired Following commands impaired: Follows multi-step commands inconsistently     Cueing  General Comments   Cueing Techniques: Verbal cues  sacral pad for protection in place, no SOB on RA noted           Home Living Family/patient expects to be discharged to:: Assisted living                             Home Equipment: Rolling Walker (2 wheels);Rollator (4 wheels);BSC/3in1;Shower seat;Wheelchair - manual;Grab bars - tub/shower;Hand held shower head   Additional Comments: Patient recieves assist for ADL's by staff and all IADL's      Prior Functioning/Environment Prior  Level of Function : Needs assist  Cognitive Assist : Mobility (cognitive);ADLs (cognitive) Mobility (Cognitive): Intermittent cues ADLs (Cognitive): Intermittent cues       Mobility Comments: uses w/c for all mobility and transfers ADLs Comments: ALF staff assist with self care, toileting and bathing as well as all IADL's    OT Problem List: Decreased strength;Decreased activity tolerance;Impaired balance (sitting and/or standing);Impaired vision/perception;Decreased cognition;Decreased safety awareness   OT Treatment/Interventions: Self-care/ADL training;Therapeutic exercise;Neuromuscular education;Energy conservation;DME and/or AE instruction;Therapeutic activities;Cognitive remediation/compensation;Patient/family education;Balance training      OT Goals(Current goals can be found in the care plan section)   Acute Rehab OT Goals Patient Stated Goal: to get my toileting going better OT Goal Formulation: With patient/family Time For Goal Achievement: 03/18/24 Potential to Achieve Goals: Fair ADL Goals Pt Will Perform Grooming: with set-up;sitting Pt Will Perform Upper Body Bathing: with supervision Pt Will Perform Lower Body Bathing: with min assist Pt Will Perform Upper Body Dressing: with set-up Pt Will Perform Lower Body Dressing: with min assist;sit to/from stand;sitting/lateral leans;with adaptive equipment Pt Will Transfer to Toilet: with contact guard assist;bedside commode   OT Frequency:  Min 2X/week       AM-PAC OT "6 Clicks" Daily Activity     Outcome Measure Help from another person eating meals?: A Little Help from another person taking care of personal grooming?: A Little Help from another person toileting, which includes using toliet, bedpan, or urinal?: A Lot Help from another person bathing (including washing, rinsing, drying)?: A Lot Help from another person to put on and taking off regular upper body clothing?: A Little Help from another person to put on  and taking off regular lower body clothing?: A Lot 6 Click Score: 15   End of Session Equipment Utilized  During Treatment: Gait belt Nurse Communication: Mobility status  Activity Tolerance: Patient tolerated treatment well Patient left: with nursing/sitter in room;with call bell/phone within reach;with chair alarm set (nursing continued to assist on bsc for BM)  OT Visit Diagnosis: Unsteadiness on feet (R26.81);History of falling (Z91.81);Muscle weakness (generalized) (M62.81);Cognitive communication deficit (R41.841)                Time: 1478-2956 OT Time Calculation (min): 40 min Charges:  OT General Charges $OT Visit: 1 Visit OT Evaluation $OT Eval Moderate Complexity: 1 Mod OT Treatments $Self Care/Home Management : 23-37 mins  Cas Tracz OT/L Acute Rehabilitation Department  [phone]  03/04/2024, 10:18 AM

## 2024-03-04 NOTE — Evaluation (Signed)
 Physical Therapy Evaluation Patient Details Name: Heather [name] MRN: [id number] DOB: 1931/05/31 Today's Date: 03/04/2024  History of Present Illness  89 yo female admitted to [facility] from [facility] with COPD exacerbation, R middle and LL lung collapse  PMH: 89 year old female with anxiety, GERD, HLD, HTN, HLD, IBS, compression fracture of L1, NASH cirrhosis, arthritis/debility previous history of respiratory failure in 2023, COPD on  Breo, previous admission on 02/24/2023 for COPD exacerbation/pneumonia  Clinical Impression  On arrival, pt was sitting on BSC attempting to have a BM. She was pleasant and cooperative. Pt reported that she mobilizes at [facility] level at baseline (receives assistance from staff for transfers to/from [facility]). Unable to practice transfers at this time due to pt waiting to get enema. She was able to explain how she typically transfers. She performed a sit to partial stand/squat x 2 from [facility]. Pt reports she plans to return to ALF once medically ready. Recommend HHPT f/u if pt and family are agreeable-may not need if pt is pretty much at her baseline. Potential d/c back to ALF on today per chart review.        If plan is discharge home, recommend the following: (P) A little help with walking and/or transfers;A little help with bathing/dressing/bathroom;Assistance with cooking/housework;Assist for transportation;Help with stairs or ramp for entrance   Can travel by private vehicle        Equipment Recommendations (P) None recommended by PT  Recommendations for Other Services       Functional Status Assessment (P) Patient has had a recent decline in their functional status and demonstrates the ability to make significant improvements in function in a reasonable and predictable amount of time.     Precautions / Restrictions Precautions Precautions: Fall Recall of Precautions/Restrictions: Impaired Restrictions Weight Bearing Restrictions Per Provider Order:  No      Mobility  Bed Mobility               General bed mobility comments: oob sitting on bsc    Transfers Overall transfer level: Needs assistance Equipment used: None Transfers: Sit to/from Stand Sit to Stand: Contact guard assist           General transfer comment: Partial sit to squat x 2 from bsc using armrest and with pt reaching for armrest of chair to demonstrate how she would transfer. Planned to practice scoot transfer again but pt had already used the commode and she was waiting for RN to give he an enema while she was sitting on the [facility].    Ambulation/Gait                  Stairs            Wheelchair Mobility     Tilt Bed    Modified Rankin (Stroke Patients Only)       Balance Overall balance assessment: Needs assistance Sitting-balance support: No upper extremity supported, Feet supported Sitting balance-Leahy Scale: Fair       Standing balance-Leahy Scale: Poor                               Pertinent Vitals/Pain Pain Assessment Pain Assessment: Faces Faces Pain Scale: No hurt    Home Living Family/patient expects to be discharged to:: Assisted living                 Home Equipment: Rolling Walker (2 wheels);Rollator (4 wheels);BSC/3in1;Shower seat;Wheelchair -  manual;Grab bars - tub/shower;Hand held shower head Additional Comments: Patient recieves assist for ADL's by staff and all IADL's    Prior Function Prior Level of Function : Needs assist             Mobility Comments: uses w/c for all mobility and transfers. does not ambulate per patient. ADLs Comments: ALF staff assist with self care, toileting and bathing as well as all IADL's     Extremity/Trunk Assessment   Upper Extremity Assessment Upper Extremity Assessment: Defer to OT evaluation    Lower Extremity Assessment Lower Extremity Assessment: Generalized weakness (pt reports chronic debility due to R LE/knee)        Communication   Communication Communication: No apparent difficulties    Cognition Arousal: Alert Behavior During Therapy: WFL for tasks assessed/performed   PT - Cognitive impairments: History of cognitive impairments                                 Cueing Cueing Techniques: Verbal cues     General Comments      Exercises     Assessment/Plan    PT Assessment Patient needs continued PT services  PT Problem List Decreased strength;Decreased range of motion;Decreased activity tolerance;Decreased balance;Decreased mobility       PT Treatment Interventions DME instruction;Functional mobility training;Therapeutic activities;Therapeutic exercise;Patient/family education;Balance training    PT Goals (Current goals can be found in the Care Plan section)  Acute Rehab PT Goals Patient Stated Goal: return to ALF PT Goal Formulation: With patient Time For Goal Achievement: 03/18/24 Potential to Achieve Goals: Fair    Frequency Min 2X/week     Co-evaluation               AM-PAC PT "6 Clicks" Mobility  Outcome Measure Help needed turning from your back to your side while in a flat bed without using bedrails?: A Little Help needed moving from lying on your back to sitting on the side of a flat bed without using bedrails?: A Little Help needed moving to and from a bed to a chair (including a wheelchair)?: A Little Help needed standing up from a chair using your arms (e.g., wheelchair or bedside chair)?: A Little Help needed to walk in hospital room?: Total Help needed climbing 3-5 steps with a railing? : Total 6 Click Score: 14    End of Session Equipment Utilized During Treatment: Gait belt Activity Tolerance: Patient tolerated treatment well Patient left: in chair (remained on BSC awaiting enema)   PT Visit Diagnosis: Muscle weakness (generalized) (M62.81);Other abnormalities of gait and mobility (R26.89)    Time: 1001-1013 PT Time Calculation (min)  (ACUTE ONLY): 12 min   Charges:   PT Evaluation $PT Eval Low Complexity: 1 Low   PT General Charges $$ ACUTE PT VISIT: 1 Visit           Tanda Falter, PT Acute Rehabilitation  Office: [phone]

## 2024-03-04 NOTE — Progress Notes (Signed)
  Daily Progress Note   Patient Name: Heather [name]       Date: 03/04/2024 DOB: Jun 11, 1931  Age: 89 y.o. MRN#: [id number] Attending Physician: Loma Rising, MD Primary Care Physician: Orlena Bitters, MD Admit Date: 02/27/2024 Length of Stay: 6 days  Reason for Consultation/Follow-up: Establishing goals of care  Subjective:   CC: Patient noting plan for enema today for BM.  Following up regarding complex medical decision making.  Subjective:  Reviewed EMR prior to presenting to bedside.  Patient's overall mentation has continued to improve.  Patient has not required as needed Haldol  for agitation and multiple nights.  Hopeful plan is for patient to return to ALF when appropriate. Pathology from biopsy still in process.  Presented to bedside to see patient.  Patient noting plan for enema today so she can have a bowel movement and then hopefully get out of here soon.  RN present at bedside.  Noted hope patient can continue to improve functional status and return to ALF soon as well.    Objective:   Vital Signs:  BP (!) 164/78 (BP Location: Left Arm)   Pulse 63   Temp 97.7 F (36.5 C) (Oral)   Resp 16   Ht 5\' 7"  (1.702 m)   Wt 86.1 kg   SpO2 94%   BMI 29.73 kg/m   Physical Exam: General: Pleasant, chronically ill-appearing, elderly Cardiovascular: RRR Respiratory: No increased work of breathing noted  Imaging: I personally reviewed recent imaging.   Assessment & Plan:   Assessment:  Patient is a 89 year old female with a past medical history of anxiety, GERD, hyperlipidemia, hypertension, IBS, NASH cirrhosis, arthritis/debility, question of COPD, and concerns for mild dementia who was transferred on 02/27/2024 from ED in [place] for management of generalized weakness and shortness of breath.  During hospitalization patient has received management for acute hypoxic respiratory failure and findings of new lung mass that underwent biopsy by PCCM.  Palliative medicine team  consulted to assist with complex medical decision making.  Recommendations/Plan: # Complex medical decision making/goals of care:  - Discussed care with patient today as described above in HPI.  Patient hoping for a bowel movement soon so she can get out of the hospital soon and returning to her ALF.  Had discussed care with patient while daughter, Heather [name], present on 03/01/2024.  Already placed signed gold DNR form on patient's chart.  Awaiting pathology results from biopsy.  Palliative medicine team continuing to follow along to engage in conversations as able and appropriate.  -  Code Status: Limited: Do not attempt resuscitation (DNR) -DNR-LIMITED -Do Not Intubate/DNI   # Symptom management: -Insomnia  - Continue melatonin nightly  # Psychosocial Support:  - DaughterJacquelene [name], sons  # Discharge Planning: To Be Determined  Discussed with: Patient, RN  Thank you for allowing the palliative care team to participate in the care Heather [name].  Barnett Libel, DO Palliative Care Provider PMT # [phone]  If patient remains symptomatic despite maximum doses, please call PMT at [phone] between 0700 and 1900. Outside of these hours, please call attending, as PMT does not have night coverage.

## 2024-03-04 NOTE — Discharge Summary (Signed)
 Physician Discharge Summary   Patient: Heather [name] MRN: [id number] DOB: 1931-01-27  Admit date:     02/27/2024  Discharge date: 03/04/24  Discharge Physician: Bertram Brocks, MD    PCP: Orlena Bitters, MD   Recommendations at discharge:   Please follow bronchoscopy biopsy results and BAL cytology Patient has a follow-up with pulmonology on 5/27 at 11 AM Diet: Mechanical soft with thin liquids (AVOID CHEWING GUM)  Discharge Diagnoses:    Acute hypoxic respiratory failure (HCC) COPD with acute exacerbation   Collapse of right lung   Endobronchial mass   Liver cirrhosis secondary to NASH (HCC)   Aspiration pneumonia (HCC)   Essential hypertension   GERD (gastroesophageal reflux disease) Mild acute diastolic CHF Delirium, sundowning superimposed on dementia   DNR (do not resuscitate)     Hospital Course:  Patient is a 89 year old female with anxiety, GERD, HLD, HTN, HLD, IBS, compression fracture of L1, NASH cirrhosis, arthritis/debility previous history of respiratory failure in 2023, History of ?COPD on  Breo, previous admission on 02/24/2023 for COPD exacerbation/pneumonia  Presented to the ED from [facility] [place] with generalized weakness, shortness of breath since last Sunday.On presentation hypoxia 80% on RA-placed on supplemental oxygen  up to 7 L, In the ED patient felt to be poor historian, tachypneic up to 30 BP stable.  Labs showed ABG PCO238 PO2.4PO 266, mild hyponatremia ammonia 37 BNP 179 leukocytosis 19.7. CXR> mild congestive changes with small bilateral pleural effusions.Aaron Aas   COVID RSV flu negative Patient was given Solu-Medrol  125, DuoNeb IV Lasix  and admission was requested. D-dimer elevated, CTA chest showed no evidence of acute PE, new right middle and lower lobe collapse with associated volume loss and endobronchial opacification.  There is an oval hyperdense endobronchial component with an the bronchus intermedius which could reflect an aspirated foreign  body or bronchial artery aneurysm. PCCM consulted   Assessment and Plan:  Acute hypoxic respiratory failure (HCC) COPD with acute exacerbation, right middle lobe and lower lobe collapse -Presented with wheezing, shortness of breath, hypoxia - Chest x-ray with congestive changes with right lower lobe hypoventilatory atelectasis - D-dimer elevated, CTA showed new right middle and lower lobe collapse with associated volume loss and endobronchial opacification -  SLP eval 5/7  dysphagia 3 diet, thin liquids - 2D echo showed EF of 65 to 70%, no regional WMA - Underwent bronchoscopy on 5/7 with endobronchial lesion, status post biopsy and BAL.   - Biopsy results still pending, explained to the patient's daughter.  Patient has an appointment with pulmonology on 5/27 at 11 AM - BAL cultures showed rare gram-negative rods, patient was placed on IV Rocephin , needs 1 more day of oral antibiotic to complete 7 days. -Continue prednisone  for 2 more days, fluconazole  100 mg daily for 3 more days,, inhalers including albuterol  as needed, Breo    Mild acute diastolic CHF - 2D echo showed EF of 65 to 70%, indeterminate diastolic parameters - Chest x-ray on admission had shown small pleural effusion, received IV Lasix  inpatient -Continue oral Lasix  as per outpatient dose   Delirium, sundowning superimposed on dementia -  Patient was noted to be delirious, with sundowning and agitation during hospitalization - Now much improved - Ammonia level normal, continue gabapentin  300 mg twice daily (decreased from 300 mg a.m., 600 mg p.m). - Discontinued tramadol      Right leg pain -Venous Dopplers showed no evidence of DVT in either extremity   GERD Continue omeprazole     Liver cirrhosis secondary to  NASH (HCC) - Ammonia level improved, continue lactulose      Essential hypertension BP stable   Atypical chest pain resolved - EKG showed no acute ST-T wave changes for ischemia, follow troponin, - May  have esophagitis/gastritis due to prednisone , continue PPI twice daily, added Maalox Follow troponin x 1   Obesity class I Estimated body mass index is 29.14 kg/m as calculated from the following:   Height as of this encounter: 5\' 7"  (1.702 m).   Weight as of this encounter: 84.4 kg.     Pain control - [place]  Controlled Substance Reporting System database was reviewed. and patient was instructed, not to drive, operate heavy machinery, perform activities at heights, swimming or participation in water activities or provide baby-sitting services while on Pain, Sleep and Anxiety Medications; until their outpatient Physician has advised to do so again. Also recommended to not to take more than prescribed Pain, Sleep and Anxiety Medications.  Consultants: Pulmonology, palliative medicine Procedures performed: Bronchoscopy with bronchial lavage Disposition: Assisted living Diet recommendation: Mechanical soft diet with thin liquids  DISCHARGE MEDICATION: Allergies as of 03/04/2024   No Known Allergies      Medication List     STOP taking these medications    benzonatate 100 MG capsule Commonly known as: TESSALON   traMADol  50 MG tablet Commonly known as: ULTRAM        TAKE these medications    acetaminophen  500 MG tablet Commonly known as: TYLENOL  Take 500 mg by mouth every 4 (four) hours as needed for mild pain, moderate pain or fever.   albuterol  108 (90 Base) MCG/ACT inhaler Commonly known as: VENTOLIN  HFA Inhale 2 puffs into the lungs every 6 (six) hours as needed for wheezing or shortness of breath.   ALPRAZolam  1 MG tablet Commonly known as: XANAX  Take 1 mg by mouth 2 (two) times daily.   aluminum-magnesium hydroxide-simethicone 200-200-20 MG/5ML Susp Commonly known as: MAALOX Take 30 mLs by mouth every 6 (six) hours as needed (indigestion, heartburn).   amLODipine  10 MG tablet Commonly known as: NORVASC  Take 10 mg by mouth every morning.   aspirin  81  MG tablet Take 1 tablet (81 mg total) by mouth daily with breakfast.   CAL-MAG-ZINC PO Take 1 tablet by mouth daily. Calcium , Vit D3, Magnesium Oxide, Zinc Oxide 333 mg- 133 unit- 133 mg- 5 mg   cefpodoxime 200 MG tablet Commonly known as: VANTIN Take 1 tablet (200 mg total) by mouth 2 (two) times daily for 1 day.   Diclofenac Sodium 3 % Gel Apply 1-2 g topically 3 (three) times daily as needed (pain). Apply to lower back, hips, and knees   dicyclomine  10 MG capsule Commonly known as: BENTYL  Take 10 mg by mouth 4 (four) times daily -  before meals and at bedtime.   docusate sodium  100 MG capsule Commonly known as: COLACE Take 100 mg by mouth daily.   ferrous sulfate  325 (65 FE) MG tablet Take 325 mg by mouth daily with breakfast.   fluconazole  100 MG tablet Commonly known as: DIFLUCAN  Take 1 tablet (100 mg total) by mouth daily for 3 days. Start taking on: Mar 05, 2024   fluticasone  furoate-vilanterol 200-25 MCG/ACT Aepb Commonly known as: BREO ELLIPTA  Inhale 1 puff into the lungs daily.   furosemide  40 MG tablet Commonly known as: LASIX  Take 40 mg by mouth daily. Leg swelling   furosemide  20 MG tablet Commonly known as: LASIX  Take 20 mg by mouth daily as needed for edema or fluid.  gabapentin  300 MG capsule Commonly known as: NEURONTIN  Take 1 capsule (300 mg total) by mouth 2 (two) times daily. What changed:  how much to take additional instructions   HALLS COUGH DROPS MT Use as directed 1 lozenge in the mouth or throat 4 (four) times daily as needed (cough). Halls Honey   ibuprofen 600 MG tablet Commonly known as: ADVIL Take 600 mg by mouth 3 (three) times daily as needed (ear pain).   isosorbide  mononitrate 60 MG 24 hr tablet Commonly known as: IMDUR  Take 60 mg by mouth daily.   lactobacillus acidophilus Tabs tablet Take 2 tablets by mouth daily at 12 noon.   lactulose  10 GM/15ML solution Commonly known as: CHRONULAC  Take 30 mLs (20 g total) by  mouth 2 (two) times daily. Goal is to Have at least 2 mushy/Non-solid bowel movements Each day-- May give 1 additional dose of lactulose  if no bowel movement in 24 hr period What changed:  how much to take when to take this reasons to take this additional instructions   lidocaine  4 % Place 1 patch onto the skin in the morning and at bedtime.   melatonin 3 MG Tabs tablet Take 3 mg by mouth at bedtime.   metoprolol  tartrate 25 MG tablet Commonly known as: LOPRESSOR  Take 25 mg by mouth 2 (two) times daily.   nitroGLYCERIN  0.4 MG SL tablet Commonly known as: NITROSTAT  Place 0.4 mg under the tongue every 5 (five) minutes as needed for chest pain.   omeprazole 20 MG capsule Commonly known as: PRILOSEC Take 40 mg by mouth daily.   polyethylene glycol 17 g packet Commonly known as: MIRALAX  / GLYCOLAX  Take 17 g by mouth daily as needed. What changed: reasons to take this   potassium chloride  10 MEQ tablet Commonly known as: KLOR-CON  Take 1 tablet (10 mEq total) by mouth daily. Take While taking Lactulose  What changed: additional instructions   predniSONE  20 MG tablet Commonly known as: DELTASONE  Take 2 tablets (40 mg total) by mouth daily before breakfast for 2 days. Start taking on: Mar 05, 2024   rosuvastatin 10 MG tablet Commonly known as: CRESTOR Take 10 mg by mouth daily.   senna-docusate 8.6-50 MG tablet Commonly known as: Senokot-S Take 1 tablet by mouth 2 (two) times daily.   Thera-M Tabs Take 1 tablet by mouth daily.        Follow-up Information     [name], [name], MD. Schedule an appointment as soon as possible for a visit in 1 week(s).   Specialty: Internal Medicine Why: for hospital follow-up Contact information: [address] [place] [address] [phone]         Raejean Bullock, NP Follow up on 03/19/2024.   Specialty: Pulmonary Disease Why: at 22 AM, for hospital follow-up Contact information: [address] [place]  [address] [phone]                Discharge Exam: Cleavon Curls Weights   03/01/24 0500 03/03/24 0511 03/04/24 0522  Weight: 88.9 kg 84.4 kg 86.1 kg   S: Pleasant, no acute complaints.  Constipation resolved.  BP (!) 164/78 (BP Location: Left Arm)   Pulse 63   Temp 97.7 F (36.5 C) (Oral)   Resp 16   Ht 5\' 7"  (1.702 m)   Wt 86.1 kg   SpO2 94%   BMI 29.73 kg/m   Physical Exam General: Alert and oriented to self and place, NAD Cardiovascular: S1 S2 clear, RRR.  Respiratory: CTAB, no wheezing Gastrointestinal: Soft, nontender, nondistended, NBS Ext: no pedal edema bilaterally Neuro: no new deficits Psych: Normal affect    Condition at discharge: fair  The results of significant diagnostics from this hospitalization (including imaging, microbiology, ancillary and laboratory) are listed below for reference.   Imaging Studies: DG Chest Port 1 View Result Date: 02/29/2024 CLINICAL DATA:  Status post bronchoscopy. EXAM: PORTABLE CHEST 1 VIEW COMPARISON:  Chest radiograph dated 02/28/2024. FINDINGS: Shallow inspiration with bibasilar atelectasis. No focal consolidation, pleural effusion or pneumothorax. The cardiac silhouette. Atherosclerotic calcification of the aorta. Osteopenia with degenerative changes of the spine. No acute osseous pathology. IMPRESSION: Shallow inspiration with bibasilar atelectasis. No pneumothorax. Electronically Signed   By: Angus Bark M.D.   On: 02/29/2024 14:27   ECHOCARDIOGRAM COMPLETE Result Date: 02/28/2024    ECHOCARDIOGRAM REPORT   Patient Name:   Heather [name] Date of Exam: 02/28/2024 Medical Rec #:  [id number]          Height:       67.0 in Accession #:    [id number]         Weight:       194.2 lb Date of Birth:  January 17, 1931          BSA:          1.997 m Patient Age:    89 years           BP:           110/56 mmHg Patient Gender: F                  HR:           91 bpm. Exam Location:  Inpatient Procedure: 2D Echo, Cardiac Doppler and Color  Doppler (Both Spectral and Color            Flow Doppler were utilized during procedure). Indications:    CHF-acute diastolic  History:        Patient has prior history of Echocardiogram examinations, most                 recent 04/26/2022. Signs/Symptoms:Chest Pain; Risk                 Factors:Hypertension.  Sonographer:    Juanita Shaw Referring Phys: [id number] RAMESH KC IMPRESSIONS  1. Left ventricular ejection fraction, by estimation, is 65 to 70%. The left ventricle has normal function. The left ventricle has no regional wall motion abnormalities. Left ventricular diastolic parameters are indeterminate.  2. Right ventricular systolic function is normal. The right ventricular size is normal. There is normal pulmonary artery systolic pressure.  3. The mitral valve is normal in structure. Mild mitral valve regurgitation. No evidence of mitral stenosis.  4. The aortic valve is normal in structure. Aortic valve regurgitation is not visualized. No aortic stenosis is present.  5. The inferior vena cava is normal in size with greater than 50% respiratory variability, suggesting right atrial pressure of 3 mmHg. FINDINGS  Left Ventricle: Left ventricular ejection fraction, by estimation, is 65 to 70%. The left ventricle has normal function. The left ventricle has no regional wall motion abnormalities. The left ventricular internal cavity size was normal in size. There is  no left ventricular hypertrophy. Left ventricular diastolic parameters are indeterminate. Right Ventricle: The right ventricular size is normal. No increase in right ventricular wall thickness. Right ventricular systolic function is normal. There is normal pulmonary artery systolic pressure. The tricuspid regurgitant velocity is 2.22 m/s,  and  with an assumed right atrial pressure of 3 mmHg, the estimated right ventricular systolic pressure is 22.7 mmHg. Left Atrium: Left atrial size was normal in size. Right Atrium: Right atrial size was normal in size.  Pericardium: There is no evidence of pericardial effusion. Mitral Valve: The mitral valve is normal in structure. Mild mitral valve regurgitation. No evidence of mitral valve stenosis. MV peak gradient, 10.1 mmHg. The mean mitral valve gradient is 5.0 mmHg. Tricuspid Valve: The tricuspid valve is normal in structure. Tricuspid valve regurgitation is not demonstrated. No evidence of tricuspid stenosis. Aortic Valve: The aortic valve is normal in structure. Aortic valve regurgitation is not visualized. No aortic stenosis is present. Aortic valve mean gradient measures 4.0 mmHg. Aortic valve peak gradient measures 7.5 mmHg. Aortic valve area, by VTI measures 2.54 cm. Pulmonic Valve: The pulmonic valve was normal in structure. Pulmonic valve regurgitation is not visualized. No evidence of pulmonic stenosis. Aorta: The aortic root is normal in size and structure. Venous: The inferior vena cava is normal in size with greater than 50% respiratory variability, suggesting right atrial pressure of 3 mmHg. IAS/Shunts: No atrial level shunt detected by color flow Doppler.  LEFT VENTRICLE PLAX 2D LVIDd:         4.30 cm LVIDs:         2.80 cm LV PW:         1.00 cm LV IVS:        0.90 cm LVOT diam:     1.80 cm LV SV:         51 LV SV Index:   26 LVOT Area:     2.54 cm  LV Volumes (MOD) LV vol d, MOD A2C: 87.6 ml LV vol d, MOD A4C: 102.0 ml LV vol s, MOD A2C: 20.0 ml LV vol s, MOD A4C: 38.8 ml LV SV MOD A2C:     67.6 ml LV SV MOD A4C:     102.0 ml LV SV MOD BP:      64.4 ml RIGHT VENTRICLE             IVC RV Basal diam:  3.00 cm     IVC diam: 1.30 cm RV Mid diam:    2.20 cm RV S prime:     13.30 cm/s TAPSE (M-mode): 2.0 cm LEFT ATRIUM             Index        RIGHT ATRIUM          Index LA diam:        3.30 cm 1.65 cm/m   RA Area:     7.94 cm LA Vol (A2C):   26.5 ml 13.27 ml/m  RA Volume:   13.30 ml 6.66 ml/m LA Vol (A4C):   47.4 ml 23.73 ml/m LA Biplane Vol: 35.9 ml 17.97 ml/m  AORTIC VALVE                    PULMONIC  VALVE AV Area (Vmax):    1.99 cm     PV Vmax:       1.01 m/s AV Area (Vmean):   2.06 cm     PV Peak grad:  4.1 mmHg AV Area (VTI):     2.54 cm AV Vmax:           137.00 cm/s AV Vmean:          87.000 cm/s AV VTI:  0.202 m AV Peak Grad:      7.5 mmHg AV Mean Grad:      4.0 mmHg LVOT Vmax:         107.00 cm/s LVOT Vmean:        70.300 cm/s LVOT VTI:          0.202 m LVOT/AV VTI ratio: 1.00  AORTA Ao Root diam: 3.10 cm Ao Asc diam:  3.40 cm MITRAL VALVE                TRICUSPID VALVE MV Area (PHT): 3.87 cm     TR Peak grad:   19.7 mmHg MV Area VTI:   2.27 cm     TR Vmax:        222.00 cm/s MV Peak grad:  10.1 mmHg MV Mean grad:  5.0 mmHg     SHUNTS MV Vmax:       1.59 m/s     Systemic VTI:  0.20 m MV Vmean:      107.0 cm/s   Systemic Diam: 1.80 cm MV Decel Time: 196 msec MV E velocity: 118.00 cm/s Dorothye Gathers MD Electronically signed by Dorothye Gathers MD Signature Date/Time: 02/28/2024/4:48:19 PM    Final    DG Chest Port 1 View Result Date: 02/28/2024 CLINICAL DATA:  CHF EXAM: PORTABLE CHEST 1 VIEW COMPARISON:  Feb 27, 2024 FINDINGS: Minimal residual bilateral interstitial prominence correlate with subtle residual congestive changes with right lower lobe hypoventilatory atelectasis No pleural effusion Heart and mediastinum normal IMPRESSION: Minimal residual congestive changes with right lower lobe hypoventilatory atelectasis. Electronically Signed   By: Fredrich Jefferson M.D.   On: 02/28/2024 08:39   CT Angio Chest PE W and/or Wo Contrast Result Date: 02/27/2024 CLINICAL DATA:  Pulmonary embolism (PE) suspected, low to intermediate prob, positive D-dimer Generalized weakness with shortness of breath for 2 days. Elevated D-dimer levels. EXAM: CT ANGIOGRAPHY CHEST WITH CONTRAST TECHNIQUE: Multidetector CT imaging of the chest was performed using the standard protocol during bolus administration of intravenous contrast. Multiplanar CT image reconstructions and MIPs were obtained to evaluate the vascular  anatomy. RADIATION DOSE REDUCTION: This exam was performed according to the departmental dose-optimization program which includes automated exposure control, adjustment of the mA and/or kV according to patient size and/or use of iterative reconstruction technique. CONTRAST:  75mL OMNIPAQUE  IOHEXOL  350 MG/ML SOLN COMPARISON:  Chest CTA 04/25/2022.  Chest radiographs 02/27/2024. FINDINGS: Cardiovascular: The pulmonary arteries are well opacified with contrast to the level of the segmental branches. There is no evidence of acute pulmonary embolism. Atherosclerosis of the aorta, great vessels and coronary arteries without acute systemic arterial abnormalities. Stable mild cardiomegaly. No significant pericardial fluid. Mediastinum/Nodes: There are no enlarged mediastinal, hilar or axillary lymph nodes.Small mediastinal lymph nodes are stable, likely reactive. The thyroid  gland and esophagus appear stable, without significant findings. Lungs/Pleura: No pleural effusion or pneumothorax. There is new right middle and lower lobe collapse with associated volume loss and endobronchial opacification. There is an oval hyperdense endobronchial component within the bronchus intermedius which measures up to 1.0 cm on coronal image 91/8. This demonstrates density similar to opacified blood vessels and could reflect an aspirated foreign body or a bronchial artery aneurysm. Mild dependent atelectasis in the left lower lobe, similar to previous study. Upper abdomen: No significant findings in the visualized upper abdomen. Musculoskeletal/Chest wall: There is no chest wall mass or suspicious osseous finding. Chronic T12 compression deformity status post spinal augmentation. Review of the MIP images confirms the above  findings. IMPRESSION: 1. No evidence of acute pulmonary embolism. 2. New right middle and lower lobe collapse with associated volume loss and endobronchial opacification. There is an oval hyperdense endobronchial component  within the bronchus intermedius which could reflect an aspirated foreign body or a bronchial artery aneurysm. Repeat CT without additional intravenous contrast may be helpful to differentiate these possibilities. 3. Coronary and aortic Atherosclerosis (ICD10-I70.0). Electronically Signed   By: Elmon Hagedorn M.D.   On: 02/27/2024 13:26   US  Venous Img Lower Bilateral (DVT) Result Date: 02/27/2024 CLINICAL DATA:  Right lower extremity pain. EXAM: BILATERAL LOWER EXTREMITY VENOUS DOPPLER ULTRASOUND TECHNIQUE: Gray-scale sonography with graded compression, as well as color Doppler and duplex ultrasound were performed to evaluate the lower extremity deep venous systems from the level of the common femoral vein and including the common femoral, femoral, profunda femoral, popliteal and calf veins including the posterior tibial, peroneal and gastrocnemius veins when visible. The superficial great saphenous vein was also interrogated. Spectral Doppler was utilized to evaluate flow at rest and with distal augmentation maneuvers in the common femoral, femoral and popliteal veins. COMPARISON:  None Available. FINDINGS: RIGHT LOWER EXTREMITY Common Femoral Vein: No evidence of thrombus. Normal compressibility, respiratory phasicity and response to augmentation. Saphenofemoral Junction: No evidence of thrombus. Normal compressibility and flow on color Doppler imaging. Profunda Femoral Vein: No evidence of thrombus. Normal compressibility and flow on color Doppler imaging. Femoral Vein: No evidence of thrombus. Normal compressibility, respiratory phasicity and response to augmentation. Popliteal Vein: No evidence of thrombus. Normal compressibility, respiratory phasicity and response to augmentation. Calf Veins: No evidence of thrombus. Normal compressibility and flow on color Doppler imaging. Superficial Great Saphenous Vein: No evidence of thrombus. Normal compressibility. Venous Reflux:  None. Other Findings: No evidence of  superficial thrombophlebitis or abnormal fluid collection. LEFT LOWER EXTREMITY Common Femoral Vein: No evidence of thrombus. Normal compressibility, respiratory phasicity and response to augmentation. Saphenofemoral Junction: No evidence of thrombus. Normal compressibility and flow on color Doppler imaging. Profunda Femoral Vein: No evidence of thrombus. Normal compressibility and flow on color Doppler imaging. Femoral Vein: No evidence of thrombus. Normal compressibility, respiratory phasicity and response to augmentation. Popliteal Vein: No evidence of thrombus. Normal compressibility, respiratory phasicity and response to augmentation. Calf Veins: No evidence of thrombus. Normal compressibility and flow on color Doppler imaging. Superficial Great Saphenous Vein: No evidence of thrombus. Normal compressibility. Venous Reflux:  None. Other Findings: No evidence of superficial thrombophlebitis or abnormal fluid collection. IMPRESSION: No evidence of deep venous thrombosis in either lower extremity. Electronically Signed   By: Erica Hau M.D.   On: 02/27/2024 13:23   DG Chest Port 1 View Result Date: 02/27/2024 CLINICAL DATA:  Shortness of breath EXAM: PORTABLE CHEST 1 VIEW COMPARISON:  January 27, 2022 FINDINGS: The heart size and mediastinal contours are within normal limits. Both lungs are clear. The visualized skeletal structures are unremarkable. Mild equalization flow towards the apices and interstitial prominence could correlate with subtle congestive changes with a small bilateral pleural reactions IMPRESSION: Mild congestive changes with small bilateral pleural reactions. Electronically Signed   By: Fredrich Jefferson M.D.   On: 02/27/2024 10:53    Microbiology: Results for orders placed or performed during the hospital encounter of 02/27/24  Resp panel by RT-PCR (RSV, Flu A&B, Covid) Anterior Nasal Swab     Status: None   Collection Time: 02/27/24 11:01 AM   Specimen: Anterior Nasal Swab  Result  Value Ref Range Status   SARS Coronavirus 2 by  RT PCR NEGATIVE NEGATIVE Final    Comment: (NOTE) SARS-CoV-2 target nucleic acids are NOT DETECTED.  The SARS-CoV-2 RNA is generally detectable in upper respiratory specimens during the acute phase of infection. The lowest concentration of SARS-CoV-2 viral copies this assay can detect is 138 copies/mL. A negative result does not preclude SARS-Cov-2 infection and should not be used as the sole basis for treatment or other patient management decisions. A negative result may occur with  improper specimen collection/handling, submission of specimen other than nasopharyngeal swab, presence of viral mutation(s) within the areas targeted by this assay, and inadequate number of viral copies(<138 copies/mL). A negative result must be combined with clinical observations, patient history, and epidemiological information. The expected result is Negative.  Fact Sheet for Patients:  BloggerCourse.com  Fact Sheet for Healthcare Providers:  SeriousBroker.it  This test is no t yet approved or cleared by the [place]  FDA and  has been authorized for detection and/or diagnosis of SARS-CoV-2 by FDA under an Emergency Use Authorization (EUA). This EUA will remain  in effect (meaning this test can be used) for the duration of the COVID-19 declaration under Section 564(b)(1) of the Act, 21 U.S.C.section 360bbb-3(b)(1), unless the authorization is terminated  or revoked sooner.       Influenza A by PCR NEGATIVE NEGATIVE Final   Influenza B by PCR NEGATIVE NEGATIVE Final    Comment: (NOTE) The Xpert Xpress SARS-CoV-2/FLU/RSV plus assay is intended as an aid in the diagnosis of influenza from Nasopharyngeal swab specimens and should not be used as a sole basis for treatment. Nasal washings and aspirates are unacceptable for Xpert Xpress SARS-CoV-2/FLU/RSV testing.  Fact Sheet for  Patients: BloggerCourse.com  Fact Sheet for Healthcare Providers: SeriousBroker.it  This test is not yet approved or cleared by the [place]  FDA and has been authorized for detection and/or diagnosis of SARS-CoV-2 by FDA under an Emergency Use Authorization (EUA). This EUA will remain in effect (meaning this test can be used) for the duration of the COVID-19 declaration under Section 564(b)(1) of the Act, 21 U.S.C. section 360bbb-3(b)(1), unless the authorization is terminated or revoked.     Resp Syncytial Virus by PCR NEGATIVE NEGATIVE Final    Comment: (NOTE) Fact Sheet for Patients: BloggerCourse.com  Fact Sheet for Healthcare Providers: SeriousBroker.it  This test is not yet approved or cleared by the [place]  FDA and has been authorized for detection and/or diagnosis of SARS-CoV-2 by FDA under an Emergency Use Authorization (EUA). This EUA will remain in effect (meaning this test can be used) for the duration of the COVID-19 declaration under Section 564(b)(1) of the Act, 21 U.S.C. section 360bbb-3(b)(1), unless the authorization is terminated or revoked.  Performed at [facility], [address], [place], [place] [address]   MRSA Next Gen by PCR, Nasal     Status: Abnormal   Collection Time: 02/27/24  1:51 PM   Specimen: Nasal Mucosa; Nasal Swab  Result Value Ref Range Status   MRSA by PCR Next Gen DETECTED (A) NOT DETECTED Final    Comment: RESULT CALLED TO, READ BACK BY AND VERIFIED WITH: G ROOS AT 1611 02/27/24 BY A WILSON (NOTE) The GeneXpert MRSA Assay (FDA approved for NASAL specimens only), is one component of a comprehensive MRSA colonization surveillance program. It is not intended to diagnose MRSA infection nor to guide or monitor treatment for MRSA infections. Test performance is not FDA approved in patients less than 6 years old. Performed at  [facility], [address] [place], [place] [address]   Culture, Respiratory w Gram Stain     Status: None   Collection Time: 02/29/24  2:00 PM   Specimen: Bronchoalveolar Lavage; Respiratory  Result Value Ref Range Status   Specimen Description BRONCHIAL ALVEOLAR LAVAGE  Final   Special Requests RLL  Final   Gram Stain NO WBC SEEN RARE GRAM NEGATIVE RODS   Final   Culture   Final    Normal respiratory flora-no Staph aureus or Pseudomonas seen Performed at [facility], [address], [place], [place] [address]    Report Status 03/03/2024 FINAL  Final  Acid Fast Smear (AFB)     Status: None   Collection Time: 02/29/24  2:00 PM   Specimen: Bronchial Alveolar Lavage; Respiratory  Result Value Ref Range Status   AFB Specimen Processing Concentration  Final   Acid Fast Smear Negative  Final    Comment: (NOTE) Performed At: [facility] [address] [place], [place] [id number] Pearlean Botts MD [id number]    Source (AFB) BRONCHIAL ALVEOLAR LAVAGE  Final    Comment: RLL Performed at [facility], [address], [place], [place] [address]     Labs: CBC: Recent Labs  Lab 02/28/24 0532 02/29/24 0523 03/01/24 0518 03/03/24 0652 03/04/24 0453  WBC 14.0* 15.1* 11.2* 11.6* 10.8*  HGB 13.8 14.8 13.2 14.7 14.4  HCT 40.7 44.9 39.2 44.6 43.8  MCV 92.9 94.1 92.7 93.7 94.2  PLT 143* 175 173 219 240   Basic Metabolic Panel: Recent Labs  Lab 02/28/24 0532 02/29/24 0523 02/29/24 1019 03/01/24 0518 03/03/24 0652 03/04/24 0453  NA 133* 139  --  138 142 137  K 3.4* 3.4*  --  3.9 3.9 3.8  CL 97* 102  --  105 105 102  CO2 23 23  --  22 28 27   GLUCOSE 172* 141*  --  156* 100* 109*  BUN 24* 30*  --  38* 33* 33*  CREATININE 0.97 0.80  --  0.84 0.75 0.77  CALCIUM  9.0 9.1  --  8.6* 9.0 9.0  MG  --   --  2.3 2.5*  --   --   PHOS  --   --  2.8 3.1  --   --    Liver Function Tests: Recent Labs  Lab 02/29/24 0523 03/01/24 0518  03/03/24 0652 03/04/24 0453  AST 25 41 59* 57*  ALT 26 38 72* 82*  ALKPHOS 48 44 47 47  BILITOT 0.3 0.6 0.6 0.5  PROT 7.3 6.6 6.6 6.6  ALBUMIN 3.4* 3.0* 3.1* 3.0*   CBG: No results for input(s): "GLUCAP" in the last 168 hours.  Discharge time spent: greater than 30 minutes.  Signed: Bertram Brocks, MD [facility] 03/04/2024

## 2024-03-04 NOTE — NC FL2 (Signed)
 [place]  MEDICAID [id number] LEVEL OF CARE FORM     IDENTIFICATION  Patient Name: Heather [name] Birthdate: 10/28/1930 Sex: female Admission Date (Current Location): 02/27/2024  [facility] and [place] Number:  Producer, television/film/video and Address:  [facility],  [address], [place] [address]      Provider Number: [id number]  Attending Physician Name and Address:  Loma Rising, MD  Relative Name and Phone Number:  Champ Coma (daughter) Ph: [phone]    Current Level of Care: Hospital Recommended Level of Care: Assisted Living Facility ([facility]) Prior Approval Number:    Date Approved/Denied:   PASRR Number:    Discharge Plan: Other (Comment) ([facility])    Current Diagnoses: Patient Active Problem List   Diagnosis Date Noted   Palliative care encounter 03/01/2024   Counseling and coordination of care 03/01/2024   Goals of care, counseling/discussion 03/01/2024   DNR (do not resuscitate) 03/01/2024   Need for emotional support 03/01/2024   Collapse of right lung 02/29/2024   Endobronchial mass 02/29/2024   Acute hypoxic respiratory failure (HCC) 02/27/2024   Abnormal CT of the abdomen 04/26/2022   Chronic back pain 04/26/2022   Closed compression fracture of body of L1 vertebra (HCC) 04/25/2022   Pressure injury of skin 04/25/2022   Acute respiratory failure with hypoxia (HCC) 04/25/2022   Low back pain 02/09/2022   Aspiration pneumonia (HCC) 01/27/2022   Elevated troponin 01/27/2022   Sepsis (HCC) 01/27/2022   Multifocal pneumonia 01/27/2022   Liver cirrhosis secondary to NASH (HCC) 11/03/2021   IBS (irritable bowel syndrome) 08/20/2020   Nausea with vomiting 06/18/2020   Anorexia 11/11/2019   Abdominal pain 08/12/2019   Constipation 08/12/2019   Rash and nonspecific skin eruption 08/12/2019   Elevated LFTs 08/12/2019   Carpal tunnel syndrome, right upper limb 08/31/2018   Essential hypertension 06/09/2018   Anxiety 06/09/2018    GERD (gastroesophageal reflux disease) 06/09/2018   Palpitations 04/22/2012   Chest pain 04/22/2012   Coronary artery disease excluded 04/22/2012   Insomnia 04/22/2012    Orientation RESPIRATION BLADDER Height & Weight     Self, Place  Normal Continent Weight: 189 lb 13.1 oz (86.1 kg) Height:  5\' 7"  (170.2 cm)  BEHAVIORAL SYMPTOMS/MOOD NEUROLOGICAL BOWEL NUTRITION STATUS      Continent Diet (Dysphagia 3 diet)  AMBULATORY STATUS COMMUNICATION OF NEEDS Skin   Extensive Assist Verbally Other (Comment) (Erythema: buttocks, bilateral heels; Ecchymosis: bilateral arms)                       Personal Care Assistance Level of Assistance  Bathing, Feeding, Dressing Bathing Assistance: Maximum assistance Feeding assistance: Limited assistance Dressing Assistance: Maximum assistance     Functional Limitations Info  Sight, Hearing, Speech Sight Info: Impaired Hearing Info: Impaired Speech Info: Adequate    SPECIAL CARE FACTORS FREQUENCY  PT (By licensed PT)     PT Frequency: HHPT/PT in-house at ALF              Contractures Contractures Info: Not present    Additional Factors Info  Code Status, Allergies, Psychotropic Code Status Info: DNR Allergies Info: NKA Psychotropic Info: See discharge summary         Current Medications (03/04/2024):  This is the current hospital active medication list Current Facility-Administered Medications  Medication Dose Route Frequency Provider Last Rate Last Admin   acetaminophen  (TYLENOL ) tablet 650 mg  650 mg Oral Q6H PRN Lesa Rape, MD   650  mg at 02/28/24 0448   Or   acetaminophen  (TYLENOL ) suppository 650 mg  650 mg Rectal Q6H PRN Kc, Lurlene Salon, MD       albuterol  (PROVENTIL ) (2.5 MG/3ML) 0.083% nebulizer solution 2.5 mg  2.5 mg Nebulization Q2H PRN Kc, Ramesh, MD       albuterol  (PROVENTIL ,VENTOLIN ) solution continuous neb  5 mg/hr Nebulization Once Wickline, Donald, MD       ALPRAZolam  (XANAX ) tablet 1 mg  1 mg Oral BID Rai,  Ripudeep K, MD   1 mg at 03/04/24 0921   alum & mag hydroxide-simeth (MAALOX/MYLANTA) 200-200-20 MG/5ML suspension 15 mL  15 mL Oral Q6H PRN Rai, Ripudeep K, MD       arformoterol  (BROVANA ) nebulizer solution 15 mcg  15 mcg Nebulization BID Kc, Ramesh, MD   15 mcg at 03/03/24 2142   aspirin  chewable tablet 81 mg  81 mg Oral Q breakfast Kc, Ramesh, MD   81 mg at 03/04/24 9604   budesonide  (PULMICORT ) nebulizer solution 0.25 mg  0.25 mg Nebulization BID Kc, Ramesh, MD   0.25 mg at 03/03/24 2145   cefUROXime (CEFTIN) tablet 500 mg  500 mg Oral BID WC Rai, Ripudeep K, MD       Chlorhexidine  Gluconate Cloth 2 % PADS 6 each  6 each Topical Q0600 Kc, Ramesh, MD   6 each at 03/04/24 0600   ferrous sulfate  tablet 325 mg  325 mg Oral Q breakfast Kc, Ramesh, MD   325 mg at 03/04/24 0920   fluconazole  (DIFLUCAN ) tablet 100 mg  100 mg Oral Daily Smiley Dung, RPH   100 mg at 03/04/24 5409   furosemide  (LASIX ) injection 20 mg  20 mg Intravenous Daily Kc, Lurlene Salon, MD   20 mg at 03/04/24 8119   gabapentin  (NEURONTIN ) capsule 300 mg  300 mg Oral BID Rai, Ripudeep K, MD   300 mg at 03/04/24 1478   haloperidol  lactate (HALDOL ) injection 0.5 mg  0.5 mg Intravenous Q6H PRN Rai, Ripudeep K, MD       isosorbide  mononitrate (IMDUR ) 24 hr tablet 60 mg  60 mg Oral Daily Kc, Ramesh, MD   60 mg at 03/04/24 0920   lactulose  (CHRONULAC ) 10 GM/15ML solution 20 g  20 g Oral Daily Rai, Ripudeep K, MD   20 g at 03/04/24 0919   melatonin tablet 3 mg  3 mg Oral Q24H Mims, Lauren W, DO   3 mg at 03/03/24 2126   metoprolol  tartrate (LOPRESSOR ) tablet 25 mg  25 mg Oral BID Kc, Lurlene Salon, MD   25 mg at 03/04/24 0920   mupirocin  ointment (BACTROBAN ) 2 %   Nasal BID Lesa Rape, MD   Given at 03/04/24 2956   ondansetron  (ZOFRAN ) tablet 4 mg  4 mg Oral Q6H PRN Kc, Lurlene Salon, MD       Or   ondansetron  (ZOFRAN ) injection 4 mg  4 mg Intravenous Q6H PRN Kc, Ramesh, MD       pantoprazole  (PROTONIX ) EC tablet 40 mg  40 mg Oral BID AC Rai, Ripudeep K,  MD   40 mg at 03/04/24 0920   polyethylene glycol (MIRALAX  / GLYCOLAX ) packet 17 g  17 g Oral Daily PRN Lesa Rape, MD   17 g at 03/04/24 2130   predniSONE  (DELTASONE ) tablet 40 mg  40 mg Oral QAC breakfast Rai, Ripudeep K, MD   40 mg at 03/04/24 8657   revefenacin  (YUPELRI ) nebulizer solution 175 mcg  175 mcg Nebulization Daily Lesa Rape, MD  175 mcg at 03/03/24 1040     Discharge Medications: Please see discharge summary for a list of discharge medications.  TAKE these medications     acetaminophen  500 MG tablet Commonly known as: TYLENOL  Take 500 mg by mouth every 4 (four) hours as needed for mild pain, moderate pain or fever.    albuterol  108 (90 Base) MCG/ACT inhaler Commonly known as: VENTOLIN  HFA Inhale 2 puffs into the lungs every 6 (six) hours as needed for wheezing or shortness of breath.    ALPRAZolam  1 MG tablet Commonly known as: XANAX  Take 1 mg by mouth 2 (two) times daily.    aluminum-magnesium hydroxide-simethicone 200-200-20 MG/5ML Susp Commonly known as: MAALOX Take 30 mLs by mouth every 6 (six) hours as needed (indigestion, heartburn).    amLODipine  10 MG tablet Commonly known as: NORVASC  Take 10 mg by mouth every morning.    aspirin  81 MG tablet Take 1 tablet (81 mg total) by mouth daily with breakfast.    CAL-MAG-ZINC PO Take 1 tablet by mouth daily. Calcium , Vit D3, Magnesium Oxide, Zinc Oxide 333 mg- 133 unit- 133 mg- 5 mg    cefpodoxime 200 MG tablet Commonly known as: VANTIN Take 1 tablet (200 mg total) by mouth 2 (two) times daily for 1 day.    Diclofenac Sodium 3 % Gel Apply 1-2 g topically 3 (three) times daily as needed (pain). Apply to lower back, hips, and knees    dicyclomine  10 MG capsule Commonly known as: BENTYL  Take 10 mg by mouth 4 (four) times daily -  before meals and at bedtime.    docusate sodium  100 MG capsule Commonly known as: COLACE Take 100 mg by mouth daily.    ferrous sulfate  325 (65 FE) MG tablet Take 325 mg by  mouth daily with breakfast.    fluconazole  100 MG tablet Commonly known as: DIFLUCAN  Take 1 tablet (100 mg total) by mouth daily for 3 days. Start taking on: Mar 05, 2024    fluticasone  furoate-vilanterol 200-25 MCG/ACT Aepb Commonly known as: BREO ELLIPTA  Inhale 1 puff into the lungs daily.    furosemide  40 MG tablet Commonly known as: LASIX  Take 40 mg by mouth daily. Leg swelling    furosemide  20 MG tablet Commonly known as: LASIX  Take 20 mg by mouth daily as needed for edema or fluid.    gabapentin  300 MG capsule Commonly known as: NEURONTIN  Take 1 capsule (300 mg total) by mouth 2 (two) times daily. What changed:  how much to take additional instructions    HALLS COUGH DROPS MT Use as directed 1 lozenge in the mouth or throat 4 (four) times daily as needed (cough). Halls Honey    ibuprofen 600 MG tablet Commonly known as: ADVIL Take 600 mg by mouth 3 (three) times daily as needed (ear pain).    isosorbide  mononitrate 60 MG 24 hr tablet Commonly known as: IMDUR  Take 60 mg by mouth daily.    lactobacillus acidophilus Tabs tablet Take 2 tablets by mouth daily at 12 noon.    lactulose  10 GM/15ML solution Commonly known as: CHRONULAC  Take 30 mLs (20 g total) by mouth 2 (two) times daily. Goal is to Have at least 2 mushy/Non-solid bowel movements Each day-- May give 1 additional dose of lactulose  if no bowel movement in 24 hr period What changed:  how much to take when to take this reasons to take this additional instructions    lidocaine  4 % Place 1 patch onto the skin in the  morning and at bedtime.    melatonin 3 MG Tabs tablet Take 3 mg by mouth at bedtime.    metoprolol  tartrate 25 MG tablet Commonly known as: LOPRESSOR  Take 25 mg by mouth 2 (two) times daily.    nitroGLYCERIN  0.4 MG SL tablet Commonly known as: NITROSTAT  Place 0.4 mg under the tongue every 5 (five) minutes as needed for chest pain.    omeprazole 20 MG capsule Commonly known as:  PRILOSEC Take 40 mg by mouth daily.    polyethylene glycol 17 g packet Commonly known as: MIRALAX  / GLYCOLAX  Take 17 g by mouth daily as needed. What changed: reasons to take this    potassium chloride  10 MEQ tablet Commonly known as: KLOR-CON  Take 1 tablet (10 mEq total) by mouth daily. Take While taking Lactulose  What changed: additional instructions    predniSONE  20 MG tablet Commonly known as: DELTASONE  Take 2 tablets (40 mg total) by mouth daily before breakfast for 2 days. Start taking on: Mar 05, 2024    rosuvastatin 10 MG tablet Commonly known as: CRESTOR Take 10 mg by mouth daily.    senna-docusate 8.6-50 MG tablet Commonly known as: Senokot-S Take 1 tablet by mouth 2 (two) times daily.    Thera-M Tabs Take 1 tablet by mouth daily.      Relevant Imaging Results:  Relevant Lab Results:   Additional Information SSN: [id number]  Zenon Hilda, LCSW

## 2024-03-13 DIAGNOSIS — J690 Pneumonitis due to inhalation of food and vomit: Secondary | ICD-10-CM | POA: Diagnosis not present

## 2024-03-13 DIAGNOSIS — K7581 Nonalcoholic steatohepatitis (NASH): Secondary | ICD-10-CM | POA: Diagnosis not present

## 2024-03-13 DIAGNOSIS — K7460 Unspecified cirrhosis of liver: Secondary | ICD-10-CM | POA: Diagnosis not present

## 2024-03-13 DIAGNOSIS — F0394 Unspecified dementia, unspecified severity, with anxiety: Secondary | ICD-10-CM | POA: Diagnosis not present

## 2024-03-13 DIAGNOSIS — Z7982 Long term (current) use of aspirin: Secondary | ICD-10-CM | POA: Diagnosis not present

## 2024-03-13 DIAGNOSIS — F05 Delirium due to known physiological condition: Secondary | ICD-10-CM | POA: Diagnosis not present

## 2024-03-13 DIAGNOSIS — I110 Hypertensive heart disease with heart failure: Secondary | ICD-10-CM | POA: Diagnosis not present

## 2024-03-13 DIAGNOSIS — E871 Hypo-osmolality and hyponatremia: Secondary | ICD-10-CM | POA: Diagnosis not present

## 2024-03-13 DIAGNOSIS — J9621 Acute and chronic respiratory failure with hypoxia: Secondary | ICD-10-CM | POA: Diagnosis not present

## 2024-03-13 DIAGNOSIS — E66811 Obesity, class 1: Secondary | ICD-10-CM | POA: Diagnosis not present

## 2024-03-13 DIAGNOSIS — K589 Irritable bowel syndrome without diarrhea: Secondary | ICD-10-CM | POA: Diagnosis not present

## 2024-03-13 DIAGNOSIS — F03918 Unspecified dementia, unspecified severity, with other behavioral disturbance: Secondary | ICD-10-CM | POA: Diagnosis not present

## 2024-03-13 DIAGNOSIS — Z7951 Long term (current) use of inhaled steroids: Secondary | ICD-10-CM | POA: Diagnosis not present

## 2024-03-13 DIAGNOSIS — I5031 Acute diastolic (congestive) heart failure: Secondary | ICD-10-CM | POA: Diagnosis not present

## 2024-03-13 DIAGNOSIS — E785 Hyperlipidemia, unspecified: Secondary | ICD-10-CM | POA: Diagnosis not present

## 2024-03-13 DIAGNOSIS — J441 Chronic obstructive pulmonary disease with (acute) exacerbation: Secondary | ICD-10-CM | POA: Diagnosis not present

## 2024-03-13 DIAGNOSIS — Z6829 Body mass index (BMI) 29.0-29.9, adult: Secondary | ICD-10-CM | POA: Diagnosis not present

## 2024-03-13 DIAGNOSIS — J9611 Chronic respiratory failure with hypoxia: Secondary | ICD-10-CM | POA: Diagnosis not present

## 2024-03-13 DIAGNOSIS — J449 Chronic obstructive pulmonary disease, unspecified: Secondary | ICD-10-CM | POA: Diagnosis not present

## 2024-03-13 DIAGNOSIS — Z515 Encounter for palliative care: Secondary | ICD-10-CM | POA: Diagnosis not present

## 2024-03-13 DIAGNOSIS — I5030 Unspecified diastolic (congestive) heart failure: Secondary | ICD-10-CM | POA: Diagnosis not present

## 2024-03-13 DIAGNOSIS — K219 Gastro-esophageal reflux disease without esophagitis: Secondary | ICD-10-CM | POA: Diagnosis not present

## 2024-03-13 DIAGNOSIS — J9811 Atelectasis: Secondary | ICD-10-CM | POA: Diagnosis not present

## 2024-03-13 DIAGNOSIS — F0392 Unspecified dementia, unspecified severity, with psychotic disturbance: Secondary | ICD-10-CM | POA: Diagnosis not present

## 2024-03-13 DIAGNOSIS — R918 Other nonspecific abnormal finding of lung field: Secondary | ICD-10-CM | POA: Diagnosis not present

## 2024-03-13 DIAGNOSIS — M1990 Unspecified osteoarthritis, unspecified site: Secondary | ICD-10-CM | POA: Diagnosis not present

## 2024-03-13 DIAGNOSIS — Z556 Problems related to health literacy: Secondary | ICD-10-CM | POA: Diagnosis not present

## 2024-03-13 DIAGNOSIS — Z87311 Personal history of (healed) other pathological fracture: Secondary | ICD-10-CM | POA: Diagnosis not present

## 2024-03-19 ENCOUNTER — Ambulatory Visit: Admitting: Acute Care

## 2024-03-27 LAB — FUNGUS CULTURE RESULT

## 2024-03-27 LAB — FUNGUS CULTURE WITH STAIN

## 2024-03-27 LAB — FUNGAL ORGANISM REFLEX

## 2024-03-29 DIAGNOSIS — J449 Chronic obstructive pulmonary disease, unspecified: Secondary | ICD-10-CM | POA: Diagnosis not present

## 2024-03-29 DIAGNOSIS — M179 Osteoarthritis of knee, unspecified: Secondary | ICD-10-CM | POA: Diagnosis not present

## 2024-04-03 DIAGNOSIS — J9621 Acute and chronic respiratory failure with hypoxia: Secondary | ICD-10-CM | POA: Diagnosis not present

## 2024-04-05 ENCOUNTER — Ambulatory Visit: Admitting: Acute Care

## 2024-04-05 ENCOUNTER — Encounter: Payer: Self-pay | Admitting: Acute Care

## 2024-04-05 VITALS — BP 135/66 | HR 60 | Ht 67.0 in | Wt 193.4 lb

## 2024-04-05 DIAGNOSIS — J441 Chronic obstructive pulmonary disease with (acute) exacerbation: Secondary | ICD-10-CM

## 2024-04-05 DIAGNOSIS — Z09 Encounter for follow-up examination after completed treatment for conditions other than malignant neoplasm: Secondary | ICD-10-CM | POA: Diagnosis not present

## 2024-04-05 DIAGNOSIS — Z9889 Other specified postprocedural states: Secondary | ICD-10-CM | POA: Diagnosis not present

## 2024-04-05 DIAGNOSIS — R911 Solitary pulmonary nodule: Secondary | ICD-10-CM

## 2024-04-05 DIAGNOSIS — J9621 Acute and chronic respiratory failure with hypoxia: Secondary | ICD-10-CM | POA: Diagnosis not present

## 2024-04-05 NOTE — Patient Instructions (Addendum)
 It is good to see you today. I am glad you are doing well since your procedure and hospitalization.  The biopsies that were done when you had the bronchoscopy were negative for malignant cells. Based on the note from Dr. Bertrum Brodie, it appears what ever was occluding your airway fell apart when he attempted to biopsy it. I am glad that your breathing has returned to normal and that your oxygen  saturations are good on room air. We will do a 1 month follow-up CT scan to ensure improvement of the right middle and lower lobes. You will get a call to get this scheduled. Follow up with Dr. Waymond Hailey in [place] office after CT Chest the first week of August. Please call Dr. Jacqui Mau office or seek emergency care for any shortness of breath, blood in your sputum, or any other breathing issues. Continue  budesonide , brovana  and yupelri  nebs.  Maintain oxygen  saturations greater than 88% at all times. Please contact office for sooner follow up if symptoms do not improve or worsen or seek emergency care

## 2024-04-05 NOTE — Progress Notes (Signed)
 History of Present Illness Heather [name] is a 89 y.o. female  with PMH reported/presumed COPD on Breo, , HTN, HLD, IBS, Previous history of respiratory failure. COPD Flare  in 2024, NASH cirrhosis, dementia and diastolic heart Failure.She will be followed by Heather [name] in [place].   Synopsis 89 yo F PMH reported/presumed COPD on Breo, Previous history of respiratory failure. COPD Flare  in 2024, NASH cirrhosis, dementia who presented to [facility] ED 02/27/24 w SOB from nursing home. SOB x 4d. Associated hypoxia req O2 up to 7 L. She had a CTA PE study in ED which was did not show PE, did show RML RLL collapse/volume loss, and what looks like aspirated foreign body in bronchus intermedius (vs bronchial artery aneurysm). D-dimer elevated.   Admitted to [facility] with plan to transfer to [place] for pulm eval, possible bronch. While at [facility], seen by palliative care (spoke w pt daughter) and goals of care are full scope of offered tx, full code. Pt. Was transferred to [facility], On 02/28/2024, she underwent bronchoscopy with biopsy.  Per Heather [name] op note the right middle lobe and right lower lobe were completely walled off within the distal end of the right bronchus intermedius.  The airway was ulcerated.  There was a Popypoild lesion present in the airway that was friable.  There is no pulsatile material or mucous plug noted. He states that the material was friable and broke apart upon biopsy. BAL cultures showed rare gram-negative rods, patient was treated with IV Rocephin , then 1 more day of oral antibiotic to complete 7 days at discharge.She was discharged on prednisone  for 2 additional days, fluconazole  100 mg daily for 3 more days,, inhalers including albuterol  as needed, Breo. She is here today for hospital follow up and post bronchoscopy with biopsies.   04/05/2024 Pt. was admitted to the hospital with weakness, shortness of breath, and low oxygen  levels. Oxygen  therapy was initiated, leading to an improvement  in her condition. A CT scan was conducted due to concerns of a possible pulmonary embolism, which revealed collapse of the right middle and lower lobes. An obstruction was identified in the airway, which was subsequently cleared and biopsied. The biopsy showed no malignancy but revealed gram-negative rods, indicating a bacterial infection. She was treated with antibiotics, including Augmentin .  Since discharge, her breathing has improved significantly, and she no longer requires supplemental oxygen . She was weaned from oxygen  prior to discharge.Her oxygen  saturation levels are consistently around 94% at her assisted living facility. No current trouble breathing, coughing, or fever. She continues to use nebulizer treatments with budesonide , Brovana , and Yupelri , which she finds effective.  She is doing well. We will do a 2 month follow up CT Chest to re-evaluate the right middle and lower lobes. I have asked her to continue using her incentive spirometer multiple times a day to help open up her lungs. Her sone is with her today, and he is in agreement with the plan of care.    Test Results: Cytology 02/29/2024 A. LUNG, RLL, LAVAGE:    FINAL MICROSCOPIC DIAGNOSIS:  - No malignant cells identified    A. RIGHT BRONCHUS INTERMEDIUS BIOPSY:  - Bronchial wall with acute and chronic inflammation, organizing  hemorrhage and prominent hemosiderin pigment  - No evidence of malignancy in the submitted specimen    Respiratory Culture RARE GRAM NEGATIVE RODS   Culture Normal respiratory flora-no Staph aureus or Pseudomonas seen  AFB Negative  CTA Chest 02/27/2024 No evidence of acute pulmonary embolism.  2. New right middle and lower lobe collapse with associated volume loss and endobronchial opacification. There is an oval hyperdense endobronchial component within the bronchus intermedius which could reflect an aspirated foreign body or a bronchial artery aneurysm. Repeat CT without additional  intravenous contrast may be helpful to differentiate these possibilities. 3. Coronary and aortic Atherosclerosis (ICD10-I70.0).    Latest Ref Rng & Units 03/04/2024    4:53 AM 03/03/2024    6:52 AM 03/01/2024    5:18 AM  CBC  WBC 4.0 - 10.5 K/uL 10.8  11.6  11.2   Hemoglobin 12.0 - 15.0 g/dL 09.8  11.9  14.7   Hematocrit 36.0 - 46.0 % 43.8  44.6  39.2   Platelets 150 - 400 K/uL 240  219  173        Latest Ref Rng & Units 03/04/2024    4:53 AM 03/03/2024    6:52 AM 03/01/2024    5:18 AM  BMP  Glucose 70 - 99 mg/dL 829  562  130   BUN 8 - 23 mg/dL 33  33  38   Creatinine 0.44 - 1.00 mg/dL 8.65  7.84  6.96   Sodium 135 - 145 mmol/L 137  142  138   Potassium 3.5 - 5.1 mmol/L 3.8  3.9  3.9   Chloride 98 - 111 mmol/L 102  105  105   CO2 22 - 32 mmol/L 27  28  22    Calcium  8.9 - 10.3 mg/dL 9.0  9.0  8.6     BNP    Component Value Date/Time   BNP 140.2 (H) 02/29/2024 1019    ProBNP No results found for: PROBNP  PFT No results found for: FEV1PRE, FEV1POST, FVCPRE, FVCPOST, TLC, DLCOUNC, PREFEV1FVCRT, PSTFEV1FVCRT  No results found.   Past medical hx Past Medical History:  Diagnosis Date   Anxiety    GERD (gastroesophageal reflux disease)    Hx of shoulder surgery    Left Shoulder - pitched nerve.   Other and unspecified hyperlipidemia    Unspecified essential hypertension      Social History   Tobacco Use   Smoking status: Never    Passive exposure: Never   Smokeless tobacco: Never  Vaping Use   Vaping status: Never Used  Substance Use Topics   Alcohol  use: No    Alcohol /week: 0.0 standard drinks of alcohol    Drug use: No    HeatherHansen reports that she has never smoked. She has never been exposed to tobacco smoke. She has never used smokeless tobacco. She reports that she does not drink alcohol  and does not use drugs.  Tobacco Cessation: Never smoker    Past surgical hx, Family hx, Social hx all reviewed.  Current Outpatient  Medications on File Prior to Visit  Medication Sig   acetaminophen  (TYLENOL ) 500 MG tablet Take 500 mg by mouth every 4 (four) hours as needed for mild pain, moderate pain or fever.   albuterol  (VENTOLIN  HFA) 108 (90 Base) MCG/ACT inhaler Inhale 2 puffs into the lungs every 6 (six) hours as needed for wheezing or shortness of breath.   ALPRAZolam  (XANAX ) 1 MG tablet Take 1 mg by mouth 2 (two) times daily.   aluminum-magnesium hydroxide-simethicone (MAALOX) 200-200-20 MG/5ML SUSP Take 30 mLs by mouth every 6 (six) hours as needed (indigestion, heartburn).   amLODipine  (NORVASC ) 10 MG tablet Take 10 mg by mouth every morning.    aspirin  81 MG tablet Take 1 tablet (81 mg total) by mouth daily with  breakfast.   Calcium -Magnesium-Zinc (CAL-MAG-ZINC PO) Take 1 tablet by mouth daily. Calcium , Vit D3, Magnesium Oxide, Zinc Oxide 333 mg- 133 unit- 133 mg- 5 mg   Diclofenac Sodium 3 % GEL Apply 1-2 g topically 3 (three) times daily as needed (pain). Apply to lower back, hips, and knees   dicyclomine  (BENTYL ) 10 MG capsule Take 10 mg by mouth 4 (four) times daily -  before meals and at bedtime.   docusate sodium  (COLACE) 100 MG capsule Take 100 mg by mouth daily.   ferrous sulfate  325 (65 FE) MG tablet Take 325 mg by mouth daily with breakfast.   fluticasone  furoate-vilanterol (BREO ELLIPTA ) 200-25 MCG/ACT AEPB Inhale 1 puff into the lungs daily.   furosemide  (LASIX ) 20 MG tablet Take 20 mg by mouth daily as needed for edema or fluid.   furosemide  (LASIX ) 40 MG tablet Take 40 mg by mouth daily. Leg swelling   gabapentin  (NEURONTIN ) 300 MG capsule Take 1 capsule (300 mg total) by mouth 2 (two) times daily.   ibuprofen (ADVIL) 600 MG tablet Take 600 mg by mouth 3 (three) times daily as needed (ear pain).   isosorbide  mononitrate (IMDUR ) 60 MG 24 hr tablet Take 60 mg by mouth daily.   lactobacillus acidophilus (BACID) TABS tablet Take 2 tablets by mouth daily at 12 noon.   lactulose  (CHRONULAC ) 10 GM/15ML  solution Take 30 mLs (20 g total) by mouth 2 (two) times daily. Goal is to Have at least 2 mushy/Non-solid bowel movements Each day-- May give 1 additional dose of lactulose  if no bowel movement in 24 hr period (Patient taking differently: Take 15 mLs by mouth 2 (two) times daily as needed for mild constipation. + every 3 days for bowel regulation)   lidocaine  4 % Place 1 patch onto the skin in the morning and at bedtime.   melatonin 3 MG TABS tablet Take 3 mg by mouth at bedtime.   metoprolol  tartrate (LOPRESSOR ) 25 MG tablet Take 25 mg by mouth 2 (two) times daily.   Multiple Vitamins-Minerals (THERA-M) TABS Take 1 tablet by mouth daily.   nitroGLYCERIN  (NITROSTAT ) 0.4 MG SL tablet Place 0.4 mg under the tongue every 5 (five) minutes as needed for chest pain.   omeprazole (PRILOSEC) 20 MG capsule Take 40 mg by mouth daily.   polyethylene glycol (MIRALAX  / GLYCOLAX ) 17 g packet Take 17 g by mouth daily as needed. (Patient taking differently: Take 17 g by mouth daily as needed for mild constipation.)   potassium chloride  (KLOR-CON ) 10 MEQ tablet Take 1 tablet (10 mEq total) by mouth daily. Take While taking Lactulose  (Patient taking differently: Take 10 mEq by mouth daily.)   rosuvastatin (CRESTOR) 10 MG tablet Take 10 mg by mouth daily.   senna-docusate (SENOKOT-S) 8.6-50 MG tablet Take 1 tablet by mouth 2 (two) times daily.   Throat Lozenges (HALLS COUGH DROPS MT) Use as directed 1 lozenge in the mouth or throat 4 (four) times daily as needed (cough). Halls Honey   No current facility-administered medications on file prior to visit.     No Known Allergies  Review Of Systems:  Constitutional:   No  weight loss, night sweats,  Fevers, chills, fatigue, or  lassitude.  HEENT:   No headaches,  Difficulty swallowing,  Tooth/dental problems, or  Sore throat,                No sneezing, itching, ear ache, nasal congestion, post nasal drip,   CV:  No chest pain,  Orthopnea, PND, swelling in lower  extremities, anasarca, dizziness, palpitations, syncope.   GI  No heartburn, indigestion, abdominal pain, nausea, vomiting, diarrhea, change in bowel habits, loss of appetite, bloody stools.   Resp: + shortness of breath with exertion less at rest.  No excess mucus, no productive cough,  No non-productive cough,  No coughing up of blood.  No change in color of mucus.  No wheezing.  No chest wall deformity  Skin: no rash or lesions.  GU: no dysuria, change in color of urine, no urgency or frequency.  No flank pain, no hematuria   MS:  No joint pain or swelling.  + decreased range of motion.  No back pain.  Psych:  No change in mood or affect. No depression or anxiety.  No memory loss.   Vital Signs BP 135/66 (BP Location: Left Arm, Patient Position: Sitting, Cuff Size: Normal)   Pulse 60   Ht 5' 7 (1.702 m)   Wt 193 lb 6.4 oz (87.7 kg)   SpO2 94%   BMI 30.29 kg/m    Physical Exam:  General- No distress,  A&Ox3, pleasanr elderly female in a wheel Chair. ENT: No sinus tenderness, TM clear, pale nasal mucosa, no oral exudate,no post nasal drip, no LAN Cardiac: S1, S2, regular rate and rhythm, no murmur Chest: No wheeze/ rales/ dullness; no accessory muscle use, no nasal flaring, no sternal retractions, diminished per right base. Abd.: Soft Non-tender, ND, BS +, Body mass index is 30.29 kg/m.  Ext: No clubbing cyanosis, edema, 1+ LE edema bilaterally Neuro:  Physical deconditioning, MAE x 4, A&O x 3 Skin: No rashes, warm and dry, No lesions  Psych: normal mood and behavior   Assessment/Plan  Post bronchoscopy with biopsies COPD Flare with acute hypoxic respiratory failure>> resolved Collapsed right middle and lower lung lobes Bronchoscopy improved symptoms. Biopsy negative for malignancy. Breathing and oxygen  saturation improved./ back to baseline Plan - Order repeat CT scan of chest in one month to assess resolution. - Schedule follow-up with Heather [name] in [place] after  CT Chest. - Please call Dr. Jacqui Mau office or seek emergency care for any shortness of breath, blood in your sputum, or any other breathing issues. - Continue  budesonide , brovana  and yupelri  nebs.  - Maintain oxygen  saturations greater than 88% at all times. - Please contact office for sooner follow up if symptoms do not improve or worsen or seek emergency care  I spent 35 minutes dedicated to the care of this patient on the date of this encounter to include pre-visit review of records, face-to-face time with the patient discussing conditions above, post visit ordering of testing, clinical documentation with the electronic health record, making appropriate referrals as documented, and communicating necessary information to the patient's healthcare team.   Raejean Bullock, NP 04/05/2024  11:51 AM

## 2024-04-12 DIAGNOSIS — R110 Nausea: Secondary | ICD-10-CM | POA: Diagnosis not present

## 2024-04-12 DIAGNOSIS — I10 Essential (primary) hypertension: Secondary | ICD-10-CM | POA: Diagnosis not present

## 2024-04-12 DIAGNOSIS — R197 Diarrhea, unspecified: Secondary | ICD-10-CM | POA: Diagnosis not present

## 2024-04-13 LAB — ACID FAST CULTURE WITH REFLEXED SENSITIVITIES (MYCOBACTERIA): Acid Fast Culture: NEGATIVE

## 2024-04-23 ENCOUNTER — Ambulatory Visit (HOSPITAL_COMMUNITY)

## 2024-04-23 DIAGNOSIS — M159 Polyosteoarthritis, unspecified: Secondary | ICD-10-CM | POA: Diagnosis not present

## 2024-04-23 DIAGNOSIS — F03918 Unspecified dementia, unspecified severity, with other behavioral disturbance: Secondary | ICD-10-CM | POA: Diagnosis not present

## 2024-04-23 DIAGNOSIS — K7460 Unspecified cirrhosis of liver: Secondary | ICD-10-CM | POA: Diagnosis not present

## 2024-04-23 DIAGNOSIS — F1320 Sedative, hypnotic or anxiolytic dependence, uncomplicated: Secondary | ICD-10-CM | POA: Diagnosis not present

## 2024-04-23 DIAGNOSIS — M25511 Pain in right shoulder: Secondary | ICD-10-CM | POA: Diagnosis not present

## 2024-04-28 DIAGNOSIS — J449 Chronic obstructive pulmonary disease, unspecified: Secondary | ICD-10-CM | POA: Diagnosis not present

## 2024-04-28 DIAGNOSIS — M179 Osteoarthritis of knee, unspecified: Secondary | ICD-10-CM | POA: Diagnosis not present

## 2024-05-29 DIAGNOSIS — J449 Chronic obstructive pulmonary disease, unspecified: Secondary | ICD-10-CM | POA: Diagnosis not present

## 2024-05-29 DIAGNOSIS — M179 Osteoarthritis of knee, unspecified: Secondary | ICD-10-CM | POA: Diagnosis not present

## 2024-06-03 NOTE — Progress Notes (Deleted)
 Heather [name], female    DOB: 06/23/1931   MRN: [id number]   Brief patient profile:  ***  *** [facility] pt referred to pulmonary clinic 06/06/2024 by *** for ***        History of Present Illness  06/06/2024  Pulmonary/ 1st office eval/Aamna Mallozzi  No chief complaint on file.    Dyspnea:  *** Cough: *** Sleep: *** SABA use: *** 02 use:*** LDSCT:***  No obvious day to day or daytime pattern/variability or assoc excess/ purulent sputum or mucus plugs or hemoptysis or cp or chest tightness, subjective wheeze or overt sinus or hb symptoms.    Also denies any obvious fluctuation of symptoms with weather or environmental changes or other aggravating or alleviating factors except as outlined above   No unusual exposure hx or h/o childhood pna/ asthma or knowledge of premature birth.  Current Allergies, Complete Past Medical History, Past Surgical History, Family History, and Social History were reviewed in [employer] record.  ROS  The following are not active complaints unless bolded Hoarseness, sore throat, dysphagia, dental problems, itching, sneezing,  nasal congestion or discharge of excess mucus or purulent secretions, ear ache,   fever, chills, sweats, unintended wt loss or wt gain, classically pleuritic or exertional cp,  orthopnea pnd or arm/hand swelling  or leg swelling, presyncope, palpitations, abdominal pain, anorexia, nausea, vomiting, diarrhea  or change in bowel habits or change in bladder habits, change in stools or change in urine, dysuria, hematuria,  rash, arthralgias, visual complaints, headache, numbness, weakness or ataxia or problems with walking or coordination,  change in mood or  memory.             Outpatient Medications Prior to Visit  Medication Sig Dispense Refill   acetaminophen  (TYLENOL ) 500 MG tablet Take 500 mg by mouth every 4 (four) hours as needed for mild pain, moderate pain or fever.     albuterol  (VENTOLIN  HFA) 108 (90 Base)  MCG/ACT inhaler Inhale 2 puffs into the lungs every 6 (six) hours as needed for wheezing or shortness of breath. 8 g 2   ALPRAZolam  (XANAX ) 1 MG tablet Take 1 mg by mouth 2 (two) times daily.     aluminum-magnesium hydroxide-simethicone (MAALOX) 200-200-20 MG/5ML SUSP Take 30 mLs by mouth every 6 (six) hours as needed (indigestion, heartburn).     amLODipine  (NORVASC ) 10 MG tablet Take 10 mg by mouth every morning.   12   aspirin  81 MG tablet Take 1 tablet (81 mg total) by mouth daily with breakfast. 30 tablet 2   Calcium -Magnesium-Zinc (CAL-MAG-ZINC PO) Take 1 tablet by mouth daily. Calcium , Vit D3, Magnesium Oxide, Zinc Oxide 333 mg- 133 unit- 133 mg- 5 mg     Diclofenac Sodium 3 % GEL Apply 1-2 g topically 3 (three) times daily as needed (pain). Apply to lower back, hips, and knees     dicyclomine  (BENTYL ) 10 MG capsule Take 10 mg by mouth 4 (four) times daily -  before meals and at bedtime.     docusate sodium  (COLACE) 100 MG capsule Take 100 mg by mouth daily.     ferrous sulfate  325 (65 FE) MG tablet Take 325 mg by mouth daily with breakfast.     fluticasone  furoate-vilanterol (BREO ELLIPTA ) 200-25 MCG/ACT AEPB Inhale 1 puff into the lungs daily. 28 each 3   furosemide  (LASIX ) 20 MG tablet Take 20 mg by mouth daily as needed for edema or fluid.     furosemide  (LASIX ) 40 MG tablet Take  40 mg by mouth daily. Leg swelling     gabapentin  (NEURONTIN ) 300 MG capsule Take 1 capsule (300 mg total) by mouth 2 (two) times daily. 60 capsule 0   ibuprofen (ADVIL) 600 MG tablet Take 600 mg by mouth 3 (three) times daily as needed (ear pain).     isosorbide  mononitrate (IMDUR ) 60 MG 24 hr tablet Take 60 mg by mouth daily.     lactobacillus acidophilus (BACID) TABS tablet Take 2 tablets by mouth daily at 12 noon.     lactulose  (CHRONULAC ) 10 GM/15ML solution Take 30 mLs (20 g total) by mouth 2 (two) times daily. Goal is to Have at least 2 mushy/Non-solid bowel movements Each day-- May give 1 additional  dose of lactulose  if no bowel movement in 24 hr period (Patient taking differently: Take 15 mLs by mouth 2 (two) times daily as needed for mild constipation. + every 3 days for bowel regulation) 236 mL 0   lidocaine  4 % Place 1 patch onto the skin in the morning and at bedtime.     melatonin 3 MG TABS tablet Take 3 mg by mouth at bedtime.     metoprolol  tartrate (LOPRESSOR ) 25 MG tablet Take 25 mg by mouth 2 (two) times daily.  3   Multiple Vitamins-Minerals (THERA-M) TABS Take 1 tablet by mouth daily.     nitroGLYCERIN  (NITROSTAT ) 0.4 MG SL tablet Place 0.4 mg under the tongue every 5 (five) minutes as needed for chest pain.     omeprazole (PRILOSEC) 20 MG capsule Take 40 mg by mouth daily.     polyethylene glycol (MIRALAX  / GLYCOLAX ) 17 g packet Take 17 g by mouth daily as needed. (Patient taking differently: Take 17 g by mouth daily as needed for mild constipation.) 14 each 0   potassium chloride  (KLOR-CON ) 10 MEQ tablet Take 1 tablet (10 mEq total) by mouth daily. Take While taking Lactulose  (Patient taking differently: Take 10 mEq by mouth daily.) 30 tablet 2   rosuvastatin (CRESTOR) 10 MG tablet Take 10 mg by mouth daily.     senna-docusate (SENOKOT-S) 8.6-50 MG tablet Take 1 tablet by mouth 2 (two) times daily.     Throat Lozenges (HALLS COUGH DROPS MT) Use as directed 1 lozenge in the mouth or throat 4 (four) times daily as needed (cough). Halls Honey     No facility-administered medications prior to visit.    Past Medical History:  Diagnosis Date   Anxiety    GERD (gastroesophageal reflux disease)    Hx of shoulder surgery    Left Shoulder - pitched nerve.   Other and unspecified hyperlipidemia    Unspecified essential hypertension       Objective:     There were no vitals taken for this visit.         Assessment   Assessment & Plan      There are no Patient Instructions on file for this visit.   Ozell America, MD 06/03/2024

## 2024-06-05 DIAGNOSIS — M1990 Unspecified osteoarthritis, unspecified site: Secondary | ICD-10-CM | POA: Diagnosis not present

## 2024-06-05 DIAGNOSIS — M25511 Pain in right shoulder: Secondary | ICD-10-CM | POA: Diagnosis not present

## 2024-06-06 ENCOUNTER — Ambulatory Visit: Admitting: Internal Medicine

## 2024-06-11 DIAGNOSIS — Z299 Encounter for prophylactic measures, unspecified: Secondary | ICD-10-CM | POA: Diagnosis not present

## 2024-06-11 DIAGNOSIS — I10 Essential (primary) hypertension: Secondary | ICD-10-CM | POA: Diagnosis not present

## 2024-06-11 DIAGNOSIS — H6121 Impacted cerumen, right ear: Secondary | ICD-10-CM | POA: Diagnosis not present

## 2024-06-11 DIAGNOSIS — B356 Tinea cruris: Secondary | ICD-10-CM | POA: Diagnosis not present

## 2024-06-29 DIAGNOSIS — M179 Osteoarthritis of knee, unspecified: Secondary | ICD-10-CM | POA: Diagnosis not present

## 2024-06-29 DIAGNOSIS — J449 Chronic obstructive pulmonary disease, unspecified: Secondary | ICD-10-CM | POA: Diagnosis not present

## 2024-07-01 DIAGNOSIS — Z299 Encounter for prophylactic measures, unspecified: Secondary | ICD-10-CM | POA: Diagnosis not present

## 2024-07-01 DIAGNOSIS — F419 Anxiety disorder, unspecified: Secondary | ICD-10-CM | POA: Diagnosis not present

## 2024-07-01 DIAGNOSIS — Z79899 Other long term (current) drug therapy: Secondary | ICD-10-CM | POA: Diagnosis not present

## 2024-07-01 DIAGNOSIS — R52 Pain, unspecified: Secondary | ICD-10-CM | POA: Diagnosis not present

## 2024-07-01 DIAGNOSIS — I10 Essential (primary) hypertension: Secondary | ICD-10-CM | POA: Diagnosis not present

## 2024-07-01 DIAGNOSIS — E7800 Pure hypercholesterolemia, unspecified: Secondary | ICD-10-CM | POA: Diagnosis not present

## 2024-07-01 DIAGNOSIS — Z0000 Encounter for general adult medical examination without abnormal findings: Secondary | ICD-10-CM | POA: Diagnosis not present

## 2024-07-01 DIAGNOSIS — Z6832 Body mass index (BMI) 32.0-32.9, adult: Secondary | ICD-10-CM | POA: Diagnosis not present

## 2024-07-29 DIAGNOSIS — R52 Pain, unspecified: Secondary | ICD-10-CM | POA: Diagnosis not present

## 2024-07-29 DIAGNOSIS — Z299 Encounter for prophylactic measures, unspecified: Secondary | ICD-10-CM | POA: Diagnosis not present

## 2024-07-29 DIAGNOSIS — M25561 Pain in right knee: Secondary | ICD-10-CM | POA: Diagnosis not present

## 2024-07-29 DIAGNOSIS — M179 Osteoarthritis of knee, unspecified: Secondary | ICD-10-CM | POA: Diagnosis not present

## 2024-07-29 DIAGNOSIS — J449 Chronic obstructive pulmonary disease, unspecified: Secondary | ICD-10-CM | POA: Diagnosis not present

## 2024-07-29 DIAGNOSIS — I10 Essential (primary) hypertension: Secondary | ICD-10-CM | POA: Diagnosis not present

## 2024-08-07 ENCOUNTER — Encounter (INDEPENDENT_AMBULATORY_CARE_PROVIDER_SITE_OTHER): Payer: Self-pay | Admitting: Gastroenterology

## 2024-08-14 ENCOUNTER — Encounter: Payer: Self-pay | Admitting: Emergency Medicine

## 2024-08-14 ENCOUNTER — Telehealth: Payer: Self-pay

## 2024-08-14 ENCOUNTER — Ambulatory Visit
Admission: EM | Admit: 2024-08-14 | Discharge: 2024-08-14 | Disposition: A | Discharge status: Home / Self Care | Attending: Family Medicine | Admitting: Family Medicine

## 2024-08-14 DIAGNOSIS — H60393 Other infective otitis externa, bilateral: Secondary | ICD-10-CM | POA: Diagnosis not present

## 2024-08-14 MED ORDER — CIPROFLOXACIN-DEXAMETHASONE 0.3-0.1 % OT SUSP: 4.0000 [drp] | Freq: Two times a day (BID) | OTIC | 0 refills | Status: DC

## 2024-08-14 MED ORDER — CIPROFLOXACIN-DEXAMETHASONE 0.3-0.1 % OT SUSP: 4.0000 [drp] | Freq: Two times a day (BID) | OTIC | 0 refills | Status: AC

## 2024-08-14 NOTE — Telephone Encounter (Signed)
 Pts son has requested that we send prescription to the [facility] pharmacy instead of [facility] so that he would be able to pick up medication tonight. Pharmacy has been changed per request.

## 2024-08-14 NOTE — ED Provider Notes (Signed)
 [facility]    CSN: [id number] Arrival date & time: 08/14/24  1638      History   Chief Complaint No chief complaint on file.   HPI Heather [name] is a 89 y.o. female.   Patient presenting today with 2-day history of bilateral ear pain.  Notes the skin of her ears and ear canal has been flaking, itching, irritated additionally.  Denies bleeding, loss of hearing, fever, chills, congestion.  So far trying over-the-counter eardrops with no relief.    Past Medical History:  Diagnosis Date   Anxiety    GERD (gastroesophageal reflux disease)    Hx of shoulder surgery    Left Shoulder - pitched nerve.   Other and unspecified hyperlipidemia    Unspecified essential hypertension     Patient Active Problem List   Diagnosis Date Noted   Palliative care encounter 03/01/2024   Counseling and coordination of care 03/01/2024   Goals of care, counseling/discussion 03/01/2024   DNR (do not resuscitate) 03/01/2024   Need for emotional support 03/01/2024   Collapse of right lung 02/29/2024   Endobronchial mass 02/29/2024   Acute hypoxic respiratory failure (HCC) 02/27/2024   Abnormal CT of the abdomen 04/26/2022   Chronic back pain 04/26/2022   Closed compression fracture of body of L1 vertebra (HCC) 04/25/2022   Pressure injury of skin 04/25/2022   Acute respiratory failure with hypoxia (HCC) 04/25/2022   Low back pain 02/09/2022   Aspiration pneumonia (HCC) 01/27/2022   Elevated troponin 01/27/2022   Sepsis (HCC) 01/27/2022   Multifocal pneumonia 01/27/2022   Liver cirrhosis secondary to NASH (HCC) 11/03/2021   IBS (irritable bowel syndrome) 08/20/2020   Nausea with vomiting 06/18/2020   Anorexia 11/11/2019   Abdominal pain 08/12/2019   Constipation 08/12/2019   Rash and nonspecific skin eruption 08/12/2019   Elevated LFTs 08/12/2019   Carpal tunnel syndrome, right upper limb 08/31/2018   Essential hypertension 06/09/2018   Anxiety 06/09/2018   GERD  (gastroesophageal reflux disease) 06/09/2018   Palpitations 04/22/2012   Chest pain 04/22/2012   Coronary artery disease excluded 04/22/2012   Insomnia 04/22/2012    Past Surgical History:  Procedure Laterality Date   BIOPSY  04/09/2020   Procedure: BIOPSY;  Surgeon: Golda Claudis PENNER, MD;  Location: [facility];  Service: Endoscopy;;   BRONCHIAL WASHINGS  02/29/2024   Procedure: IRRIGATION, BRONCHUS;  Surgeon: Geronimo Amel, MD;  Location: [facility];  Service: Endoscopy;;   CERVICAL DISC SURGERY     CESAREAN SECTION  1970   colonscopy  08/22/2012   moderate diverticulosos in sigmoid and two tubular adenomas   ESOPHAGOGASTRODUODENOSCOPY N/A 04/09/2020   Rehman:Normal hypopharynx.normal esophagus, z line irreg, 38 cm from incisors, erythematous mucosa in antrum and preplyoric region, gastric antral mucosa with non specific reactive gastropathy, normal duodenal bul and second portion of duodenum   History of shoulder surgery Left    pitched nerve.   LUNG BIOPSY  02/29/2024   Procedure: BIOPSY, LUNG;  Surgeon: Geronimo Amel, MD;  Location: [facility];  Service: Endoscopy;;   VIDEO BRONCHOSCOPY N/A 02/29/2024   Procedure: VIDEO BRONCHOSCOPY WITHOUT FLUORO;  Surgeon: Geronimo Amel, MD;  Location: [facility];  Service: Endoscopy;  Laterality: N/A;    OB History   No obstetric history on file.      Home Medications    Prior to Admission medications   Medication Sig Start Date End Date Taking? Authorizing Provider  acetaminophen  (TYLENOL ) 500 MG tablet Take 500 mg by mouth every 4 (  four) hours as needed for mild pain, moderate pain or fever.    [provider]  albuterol  (VENTOLIN  HFA) 108 (90 Base) MCG/ACT inhaler Inhale 2 puffs into the lungs every 6 (six) hours as needed for wheezing or shortness of breath. 03/04/24   Rai, Nydia POUR, MD  ALPRAZolam  (XANAX ) 1 MG tablet Take 1 mg by mouth 2 (two) times daily. 03/16/12   [provider]   aluminum-magnesium hydroxide-simethicone (MAALOX) 200-200-20 MG/5ML SUSP Take 30 mLs by mouth every 6 (six) hours as needed (indigestion, heartburn).    [provider]  amLODipine  (NORVASC ) 10 MG tablet Take 10 mg by mouth every morning.  07/14/17   [provider]  aspirin  81 MG tablet Take 1 tablet (81 mg total) by mouth daily with breakfast. 01/28/22   Pearlean Manus, MD  Calcium -Magnesium-Zinc (CAL-MAG-ZINC PO) Take 1 tablet by mouth daily. Calcium , Vit D3, Magnesium Oxide, Zinc Oxide 333 mg- 133 unit- 133 mg- 5 mg    [provider]  ciprofloxacin -dexamethasone  (CIPRODEX) OTIC suspension Place 4 drops into both ears 2 (two) times daily. 08/14/24   Stuart Vernell Norris, PA-C  Diclofenac Sodium 3 % GEL Apply 1-2 g topically 3 (three) times daily as needed (pain). Apply to lower back, hips, and knees    [provider]  dicyclomine  (BENTYL ) 10 MG capsule Take 10 mg by mouth 4 (four) times daily -  before meals and at bedtime. 06/15/20   [provider]  docusate sodium  (COLACE) 100 MG capsule Take 100 mg by mouth daily.    [provider]  ferrous sulfate  325 (65 FE) MG tablet Take 325 mg by mouth daily with breakfast.    [provider]  fluticasone  furoate-vilanterol (BREO ELLIPTA ) 200-25 MCG/ACT AEPB Inhale 1 puff into the lungs daily. 03/04/24   Rai, Nydia POUR, MD  furosemide  (LASIX ) 20 MG tablet Take 20 mg by mouth daily as needed for edema or fluid. 01/10/24   [provider]  furosemide  (LASIX ) 40 MG tablet Take 40 mg by mouth daily. Leg swelling    [provider]  gabapentin  (NEURONTIN ) 300 MG capsule Take 1 capsule (300 mg total) by mouth 2 (two) times daily. 03/04/24   Rai, Ripudeep POUR, MD  ibuprofen (ADVIL) 600 MG tablet Take 600 mg by mouth 3 (three) times daily as needed (ear pain).    [provider]  isosorbide  mononitrate (IMDUR ) 60 MG 24 hr tablet Take 60 mg by mouth daily. 01/17/22   [provider]  lactobacillus acidophilus (BACID) TABS tablet Take 2 tablets by mouth daily at 12 noon.    [provider]  lactulose  (CHRONULAC ) 10 GM/15ML solution Take 30 mLs (20 g total) by mouth 2 (two) times daily. Goal is to Have at least 2 mushy/Non-solid bowel movements Each day-- May give 1 additional dose of lactulose  if no bowel movement in 24 hr period Patient taking differently: Take 15 mLs by mouth 2 (two) times daily as needed for mild constipation. + every 3 days for bowel regulation 01/28/22   Pearlean Manus, MD  lidocaine  4 % Place 1 patch onto the skin in the morning and at bedtime.    [provider]  melatonin 3 MG TABS tablet Take 3 mg by mouth at bedtime.    [provider]  metoprolol  tartrate (LOPRESSOR ) 25 MG tablet Take 25 mg by mouth 2 (two) times daily. 07/10/18   [provider]  Multiple Vitamins-Minerals (THERA-M) TABS Take 1 tablet by mouth  daily.    [provider]  nitroGLYCERIN  (NITROSTAT ) 0.4 MG SL tablet Place 0.4 mg under the tongue every 5 (five) minutes as needed for chest pain.    [provider]  omeprazole (PRILOSEC) 20 MG capsule Take 40 mg by mouth daily.    [provider]  polyethylene glycol (MIRALAX  / GLYCOLAX ) 17 g packet Take 17 g by mouth daily as needed. Patient taking differently: Take 17 g by mouth daily as needed for mild constipation. 04/28/22   Antoinette Doe, MD  potassium chloride  (KLOR-CON ) 10 MEQ tablet Take 1 tablet (10 mEq total) by mouth daily. Take While taking Lactulose  Patient taking differently: Take 10 mEq by mouth daily. 01/28/22   Pearlean Manus, MD  rosuvastatin (CRESTOR) 10 MG tablet Take 10 mg by mouth daily.    [provider]  senna-docusate (SENOKOT-S) 8.6-50 MG tablet Take 1 tablet by mouth 2 (two) times daily. 04/28/22   Antoinette Doe, MD  Throat Lozenges (HALLS COUGH DROPS MT) Use as directed 1 lozenge in the mouth or throat 4 (four) times daily as  needed (cough). Halls [employer]    [provider]    Family History Family History  Problem Relation Age of Onset   Hypertension Other    Heart attack Sister     Social History Social History   Tobacco Use   Smoking status: Never    Passive exposure: Never   Smokeless tobacco: Never  Vaping Use   Vaping status: Never Used  Substance Use Topics   Alcohol  use: No    Alcohol /week: 0.0 standard drinks of alcohol    Drug use: No     Allergies   Patient has no known allergies.   Review of Systems Review of Systems Per HPI  Physical Exam Triage Vital Signs ED Triage Vitals  Encounter Vitals Group     BP 08/14/24 1659 (!) 175/69     Girls Systolic BP Percentile --      Girls Diastolic BP Percentile --      Boys Systolic BP Percentile --      Boys Diastolic BP Percentile --      Pulse Rate 08/14/24 1659 94     Resp 08/14/24 1659 20     Temp 08/14/24 1659 97.9 F (36.6 C)     Temp Source 08/14/24 1659 Oral     SpO2 08/14/24 1659 91 %     Weight --      Height --      Head Circumference --      Peak Flow --      Pain Score 08/14/24 1700 8     Pain Loc --      Pain Education --      Exclude from Growth Chart --    No data found.  Updated Vital Signs BP (!) 175/69 (BP Location: Right Arm)   Pulse 94   Temp 97.9 F (36.6 C) (Oral)   Resp 20   SpO2 91%   Visual Acuity Right Eye Distance:   Left Eye Distance:   Bilateral Distance:    Right Eye Near:   Left Eye Near:    Bilateral Near:     Physical Exam Vitals and nursing note reviewed.  Constitutional:      Appearance: Normal appearance. She is not ill-appearing.  HENT:     Head: Atraumatic.     Ears:     Comments: Bilateral EACs erythematous, flaking, edematous right worse than left.  TMs appear benign bilaterally  Eyes:     Extraocular Movements: Extraocular movements intact.     Conjunctiva/sclera: Conjunctivae normal.  Cardiovascular:     Rate and Rhythm: Normal rate.  Pulmonary:      Effort: Pulmonary effort is normal.  Musculoskeletal:        General: Normal range of motion.     Cervical back: Normal range of motion and neck supple.  Skin:    General: Skin is warm and dry.  Neurological:     Mental Status: She is alert and oriented to person, place, and time.  Psychiatric:        Mood and Affect: Mood normal.        Thought Content: Thought content normal.        Judgment: Judgment normal.      UC Treatments / Results  Labs (all labs ordered are listed, but only abnormal results are displayed) Labs Reviewed - No data to display  EKG   Radiology No results found.  Procedures Procedures (including critical care time)  Medications Ordered in UC Medications - No data to display  Initial Impression / Assessment and Plan / UC Course  I have reviewed the triage vital signs and the nursing notes.  Pertinent labs & imaging results that were available during my care of the patient were reviewed by me and considered in my medical decision making (see chart for details).     Treat with Ciprodex, barrier creams, avoid itching or further irritation.  Return for worsening or unresolving symptoms.  Final Clinical Impressions(s) / UC Diagnoses   Final diagnoses:  Infective otitis externa of both ears     Discharge Instructions      I have prescribed Ciprodex eardrops to help with your ear pain and ear canal irritation.  You will apply 4 drops to both ears twice daily until symptoms are resolved.  You may also use hydrocortisone cream and/or Vaseline or Aquaphor to help soothe the dry, flaking and irritated skin.    ED Prescriptions     Medication Sig Dispense Auth. Provider   ciprofloxacin -dexamethasone  (CIPRODEX) OTIC suspension  (Status: Discontinued) Place 4 drops into the left ear 2 (two) times daily. 7.5 mL Stuart Vernell Norris, PA-C   ciprofloxacin -dexamethasone  (CIPRODEX) OTIC suspension Place 4 drops into both ears 2 (two) times daily. 7.5 mL  Stuart Vernell Norris, [place]      PDMP not reviewed this encounter.   Stuart Vernell Norris, PA-C 08/14/24 1734

## 2024-08-14 NOTE — Discharge Instructions (Addendum)
 I have prescribed Ciprodex eardrops to help with your ear pain and ear canal irritation.  You will apply 4 drops to both ears twice daily until symptoms are resolved.  You may also use hydrocortisone cream and/or Vaseline or Aquaphor to help soothe the dry, flaking and irritated skin.

## 2024-08-14 NOTE — ED Triage Notes (Signed)
Right ear pain x 2 days.

## 2024-08-29 DIAGNOSIS — J449 Chronic obstructive pulmonary disease, unspecified: Secondary | ICD-10-CM | POA: Diagnosis not present

## 2024-08-29 DIAGNOSIS — M179 Osteoarthritis of knee, unspecified: Secondary | ICD-10-CM | POA: Diagnosis not present

## 2024-09-18 DIAGNOSIS — F1320 Sedative, hypnotic or anxiolytic dependence, uncomplicated: Secondary | ICD-10-CM | POA: Diagnosis not present

## 2024-09-18 DIAGNOSIS — I10 Essential (primary) hypertension: Secondary | ICD-10-CM | POA: Diagnosis not present

## 2024-09-18 DIAGNOSIS — I5032 Chronic diastolic (congestive) heart failure: Secondary | ICD-10-CM | POA: Diagnosis not present

## 2024-09-18 DIAGNOSIS — F03918 Unspecified dementia, unspecified severity, with other behavioral disturbance: Secondary | ICD-10-CM | POA: Diagnosis not present

## 2024-09-18 DIAGNOSIS — J449 Chronic obstructive pulmonary disease, unspecified: Secondary | ICD-10-CM | POA: Diagnosis not present

## 2024-09-18 DIAGNOSIS — Z299 Encounter for prophylactic measures, unspecified: Secondary | ICD-10-CM | POA: Diagnosis not present

## 2024-09-28 DIAGNOSIS — J449 Chronic obstructive pulmonary disease, unspecified: Secondary | ICD-10-CM | POA: Diagnosis not present
